# Patient Record
Sex: Female | Born: 1940 | Race: Black or African American | Hispanic: No | Smoking: Former smoker
Health system: Southern US, Community
[De-identification: ages and names within clinical notes are randomized; demographics above are authoritative.]

## PROBLEM LIST (undated history)

## (undated) DIAGNOSIS — R413 Other amnesia: Secondary | ICD-10-CM

## (undated) DIAGNOSIS — K635 Polyp of colon: Secondary | ICD-10-CM

## (undated) DIAGNOSIS — G4700 Insomnia, unspecified: Secondary | ICD-10-CM

## (undated) DIAGNOSIS — R296 Repeated falls: Secondary | ICD-10-CM

## (undated) DIAGNOSIS — I2510 Atherosclerotic heart disease of native coronary artery without angina pectoris: Secondary | ICD-10-CM

## (undated) DIAGNOSIS — M179 Osteoarthritis of knee, unspecified: Secondary | ICD-10-CM

## (undated) DIAGNOSIS — I5189 Other ill-defined heart diseases: Secondary | ICD-10-CM

## (undated) DIAGNOSIS — R600 Localized edema: Secondary | ICD-10-CM

## (undated) DIAGNOSIS — I779 Disorder of arteries and arterioles, unspecified: Secondary | ICD-10-CM

## (undated) DIAGNOSIS — I700 Atherosclerosis of aorta: Secondary | ICD-10-CM

## (undated) DIAGNOSIS — N1831 Chronic kidney disease, stage 3a: Secondary | ICD-10-CM

## (undated) DIAGNOSIS — T7840XA Allergy, unspecified, initial encounter: Secondary | ICD-10-CM

## (undated) DIAGNOSIS — I10 Essential (primary) hypertension: Secondary | ICD-10-CM

## (undated) DIAGNOSIS — M542 Cervicalgia: Secondary | ICD-10-CM

## (undated) DIAGNOSIS — F419 Anxiety disorder, unspecified: Secondary | ICD-10-CM

## (undated) DIAGNOSIS — J45909 Unspecified asthma, uncomplicated: Secondary | ICD-10-CM

## (undated) DIAGNOSIS — H409 Unspecified glaucoma: Secondary | ICD-10-CM

## (undated) DIAGNOSIS — R519 Headache, unspecified: Secondary | ICD-10-CM

## (undated) DIAGNOSIS — E118 Type 2 diabetes mellitus with unspecified complications: Secondary | ICD-10-CM

## (undated) DIAGNOSIS — M1990 Unspecified osteoarthritis, unspecified site: Secondary | ICD-10-CM

## (undated) DIAGNOSIS — S22080A Wedge compression fracture of T11-T12 vertebra, initial encounter for closed fracture: Secondary | ICD-10-CM

## (undated) DIAGNOSIS — R55 Syncope and collapse: Secondary | ICD-10-CM

## (undated) DIAGNOSIS — E119 Type 2 diabetes mellitus without complications: Secondary | ICD-10-CM

## (undated) DIAGNOSIS — E785 Hyperlipidemia, unspecified: Secondary | ICD-10-CM

## (undated) DIAGNOSIS — M8580 Other specified disorders of bone density and structure, unspecified site: Secondary | ICD-10-CM

## (undated) HISTORY — PX: TOTAL ABDOMINAL HYSTERECTOMY W/ BILATERAL SALPINGOOPHORECTOMY: SHX83

## (undated) HISTORY — DX: Essential (primary) hypertension: I10

## (undated) HISTORY — PX: ECTOPIC PREGNANCY SURGERY: SHX613

## (undated) HISTORY — DX: Allergy, unspecified, initial encounter: T78.40XA

## (undated) HISTORY — DX: Hyperlipidemia, unspecified: E78.5

## (undated) HISTORY — PX: ABDOMINAL HYSTERECTOMY: SHX81

## (undated) HISTORY — DX: Type 2 diabetes mellitus without complications: E11.9

## (undated) HISTORY — DX: Unspecified glaucoma: H40.9

## (undated) HISTORY — PX: EYE SURGERY: SHX253

## (undated) HISTORY — PX: LIPOMA RESECTION: SHX23

## (undated) HISTORY — DX: Unspecified asthma, uncomplicated: J45.909

## (undated) HISTORY — DX: Osteoarthritis of knee, unspecified: M17.9

---

## 2005-03-20 ENCOUNTER — Ambulatory Visit: Payer: Self-pay | Admitting: Orthopedic Surgery

## 2006-04-11 ENCOUNTER — Ambulatory Visit: Payer: Self-pay | Admitting: Gastroenterology

## 2006-04-28 ENCOUNTER — Emergency Department: Payer: Self-pay | Admitting: Emergency Medicine

## 2009-11-04 LAB — HM COLONOSCOPY

## 2010-06-04 ENCOUNTER — Ambulatory Visit: Payer: Self-pay | Admitting: Unknown Physician Specialty

## 2010-07-05 ENCOUNTER — Ambulatory Visit: Payer: Self-pay | Admitting: Unknown Physician Specialty

## 2011-12-13 ENCOUNTER — Emergency Department: Payer: Self-pay | Admitting: Internal Medicine

## 2011-12-13 LAB — COMPREHENSIVE METABOLIC PANEL
Alkaline Phosphatase: 66 U/L (ref 50–136)
Anion Gap: 11 (ref 7–16)
Bilirubin,Total: 1.2 mg/dL — ABNORMAL HIGH (ref 0.2–1.0)
Calcium, Total: 9.1 mg/dL (ref 8.5–10.1)
Co2: 27 mmol/L (ref 21–32)
EGFR (African American): >60
EGFR (Non-African Amer.): >60
SGOT(AST): 24 U/L (ref 15–37)
SGPT (ALT): 22 U/L

## 2011-12-13 LAB — CBC
HCT: 44 % (ref 35.0–47.0)
MCHC: 33.1 g/dL (ref 32.0–36.0)
MCV: 89 fL (ref 80–100)
Platelet: 191 10*3/uL (ref 150–440)
RBC: 4.94 10*6/uL (ref 3.80–5.20)
WBC: 7.5 10*3/uL (ref 3.6–11.0)

## 2012-03-11 ENCOUNTER — Ambulatory Visit: Payer: Self-pay | Admitting: Family Medicine

## 2012-04-02 ENCOUNTER — Ambulatory Visit: Payer: Self-pay | Admitting: Gastroenterology

## 2012-07-22 ENCOUNTER — Emergency Department: Payer: Self-pay | Admitting: Emergency Medicine

## 2012-07-22 LAB — BASIC METABOLIC PANEL
BUN: 14 mg/dL (ref 7–18)
Calcium, Total: 9.3 mg/dL (ref 8.5–10.1)
Co2: 29 mmol/L (ref 21–32)
Osmolality: 287 (ref 275–301)
Potassium: 3.6 mmol/L (ref 3.5–5.1)
Sodium: 143 mmol/L (ref 136–145)

## 2012-07-22 LAB — URINALYSIS, COMPLETE
Bacteria: NONE SEEN
Blood: NEGATIVE
Glucose,UR: NEGATIVE mg/dL (ref 0–75)
Ketone: NEGATIVE
Ph: 7 (ref 4.5–8.0)
RBC,UR: 1 /HPF (ref 0–5)
Specific Gravity: 1.014 (ref 1.003–1.030)
Squamous Epithelial: 1

## 2012-07-22 LAB — TROPONIN I: Troponin-I: <0.02 ng/mL

## 2012-07-22 LAB — CBC
HCT: 43.7 % (ref 35.0–47.0)
HGB: 14.7 g/dL (ref 12.0–16.0)
MCH: 29.7 pg (ref 26.0–34.0)
MCV: 88 fL (ref 80–100)
Platelet: 193 10*3/uL (ref 150–440)
RBC: 4.95 10*6/uL (ref 3.80–5.20)
RDW: 14 % (ref 11.5–14.5)
WBC: 7.9 10*3/uL (ref 3.6–11.0)

## 2012-07-22 LAB — CK TOTAL AND CKMB (NOT AT ARMC): CK-MB: 0.9 ng/mL (ref 0.5–3.6)

## 2012-07-23 LAB — CK TOTAL AND CKMB (NOT AT ARMC)
CK, Total: 152 U/L (ref 21–215)
CK-MB: 0.6 ng/mL (ref 0.5–3.6)

## 2013-10-01 ENCOUNTER — Ambulatory Visit: Payer: Self-pay | Admitting: Physician Assistant

## 2014-03-07 ENCOUNTER — Ambulatory Visit: Payer: Self-pay | Admitting: Physician Assistant

## 2014-04-22 ENCOUNTER — Ambulatory Visit: Payer: Self-pay | Admitting: Unknown Physician Specialty

## 2014-05-03 DIAGNOSIS — M1711 Unilateral primary osteoarthritis, right knee: Secondary | ICD-10-CM | POA: Insufficient documentation

## 2014-05-03 DIAGNOSIS — M1712 Unilateral primary osteoarthritis, left knee: Secondary | ICD-10-CM | POA: Insufficient documentation

## 2014-05-10 LAB — HM MAMMOGRAPHY: HM Mammogram: NORMAL

## 2014-10-17 ENCOUNTER — Ambulatory Visit: Payer: Self-pay | Admitting: Family Medicine

## 2014-12-12 DIAGNOSIS — I10 Essential (primary) hypertension: Secondary | ICD-10-CM | POA: Diagnosis not present

## 2014-12-12 DIAGNOSIS — E1129 Type 2 diabetes mellitus with other diabetic kidney complication: Secondary | ICD-10-CM | POA: Diagnosis not present

## 2014-12-12 DIAGNOSIS — E1165 Type 2 diabetes mellitus with hyperglycemia: Secondary | ICD-10-CM | POA: Diagnosis not present

## 2014-12-12 DIAGNOSIS — N289 Disorder of kidney and ureter, unspecified: Secondary | ICD-10-CM | POA: Diagnosis not present

## 2014-12-27 DIAGNOSIS — H4011X2 Primary open-angle glaucoma, moderate stage: Secondary | ICD-10-CM | POA: Diagnosis not present

## 2015-02-09 DIAGNOSIS — N289 Disorder of kidney and ureter, unspecified: Secondary | ICD-10-CM | POA: Diagnosis not present

## 2015-02-09 DIAGNOSIS — I10 Essential (primary) hypertension: Secondary | ICD-10-CM | POA: Diagnosis not present

## 2015-02-09 DIAGNOSIS — J302 Other seasonal allergic rhinitis: Secondary | ICD-10-CM | POA: Diagnosis not present

## 2015-02-09 DIAGNOSIS — E1165 Type 2 diabetes mellitus with hyperglycemia: Secondary | ICD-10-CM | POA: Diagnosis not present

## 2015-02-09 DIAGNOSIS — E1129 Type 2 diabetes mellitus with other diabetic kidney complication: Secondary | ICD-10-CM | POA: Diagnosis not present

## 2015-03-03 DIAGNOSIS — H4011X2 Primary open-angle glaucoma, moderate stage: Secondary | ICD-10-CM | POA: Diagnosis not present

## 2015-04-14 ENCOUNTER — Ambulatory Visit
Admission: EM | Admit: 2015-04-14 | Discharge: 2015-04-14 | Disposition: A | Discharge status: Home / Self Care | Payer: Medicare Other | Attending: Family Medicine | Admitting: Family Medicine

## 2015-04-14 DIAGNOSIS — L03113 Cellulitis of right upper limb: Secondary | ICD-10-CM | POA: Diagnosis not present

## 2015-04-14 MED ORDER — FLUCONAZOLE 150 MG PO TABS: 150.0000 mg | ORAL_TABLET | Freq: Every day | ORAL | Status: DC

## 2015-04-14 MED ORDER — CEPHALEXIN 500 MG PO CAPS: 500.0000 mg | ORAL_CAPSULE | Freq: Three times a day (TID) | ORAL | Status: DC

## 2015-04-14 NOTE — Discharge Instructions (Signed)

## 2015-04-14 NOTE — ED Provider Notes (Signed)
CSN: [id number]     Arrival date & time 04/14/15  1304 History   First MD Initiated Contact with Patient 04/14/15 1409     Chief Complaint  Patient presents with  . Insect Bite   (Consider location/radiation/quality/duration/timing/severity/associated sxs/prior Treatment) HPI Comments: 74 yo female with a 3 days h/o right arm redness, warmth, itching, pain. States thinks she may have been bitten by some insect. Denies any fevers or chills.  The history is provided by the patient.    History reviewed. No pertinent past medical history. History reviewed. No pertinent past surgical history. No family history on file. History  Substance Use Topics  . Smoking status: Never Smoker   . Smokeless tobacco: Not on file  . Alcohol Use: No   OB History    No data available     Review of Systems  Allergies  Sulfa antibiotics  Home Medications   Prior to Admission medications   Medication Sig Start Date End Date Taking? Authorizing Provider  cephALEXin (KEFLEX) 500 MG capsule Take 1 capsule (500 mg total) by mouth 3 (three) times daily. 04/14/15   Norval Gable, MD  fluconazole (DIFLUCAN) 150 MG tablet Take 1 tablet (150 mg total) by mouth daily. 04/14/15   Norval Gable, MD   BP 128/56 mmHg  Pulse 71  Temp(Src) 98.5 F (36.9 C) (Tympanic)  Ht 5\' 9"  (1.753 m)  Wt 162 lb (73.483 kg)  BMI 23.91 kg/m2  SpO2 100%  LMP  Physical Exam  Constitutional: She appears well-developed and well-nourished. No distress.  Pulmonary/Chest: Effort normal. No respiratory distress.  Skin: Rash noted. She is not diaphoretic.  Area on right upper arm skin approx 6x4cm with warmth, blanchable erythema and mild tenderness to palpation  Nursing note reviewed.   ED Course  Procedures (including critical care time) Labs Review Labs Reviewed - No data to display  Imaging Review No results found.   MDM   1. Cellulitis of arm, right    Discharge Medication List as of 04/14/2015  2:18 PM     START taking these medications   Details  cephALEXin (KEFLEX) 500 MG capsule Take 1 capsule (500 mg total) by mouth 3 (three) times daily., Starting 04/14/2015, Until Discontinued, Normal    fluconazole (DIFLUCAN) 150 MG tablet Take 1 tablet (150 mg total) by mouth daily., Starting 04/14/2015, Until Discontinued, Normal      Plan: 1. Diagnosis reviewed with patient 2. rx as per orders; risks, benefits, potential side effects reviewed with patient 3. Recommend supportive treatment with warm compresses to affected area 4. F/u prn if symptoms worsen or don't improve    Norval Gable, MD 04/14/15 1701

## 2015-04-14 NOTE — ED Notes (Signed)
Pt states "I was driving Wednesday evening and my right arm started itching. I guess I was bitten by an insect of some sort, I didn't see it." Pt's posterior right upper arm with redness and swelling.

## 2015-04-26 ENCOUNTER — Encounter: Payer: Self-pay | Admitting: Internal Medicine

## 2015-04-26 DIAGNOSIS — E119 Type 2 diabetes mellitus without complications: Secondary | ICD-10-CM | POA: Insufficient documentation

## 2015-04-26 DIAGNOSIS — G4700 Insomnia, unspecified: Secondary | ICD-10-CM | POA: Insufficient documentation

## 2015-04-26 DIAGNOSIS — S83209A Unspecified tear of unspecified meniscus, current injury, unspecified knee, initial encounter: Secondary | ICD-10-CM | POA: Insufficient documentation

## 2015-04-26 DIAGNOSIS — I10 Essential (primary) hypertension: Secondary | ICD-10-CM | POA: Insufficient documentation

## 2015-04-26 DIAGNOSIS — H409 Unspecified glaucoma: Secondary | ICD-10-CM | POA: Insufficient documentation

## 2015-04-26 DIAGNOSIS — J302 Other seasonal allergic rhinitis: Secondary | ICD-10-CM | POA: Insufficient documentation

## 2015-04-26 DIAGNOSIS — E785 Hyperlipidemia, unspecified: Secondary | ICD-10-CM | POA: Insufficient documentation

## 2015-04-26 DIAGNOSIS — E1169 Type 2 diabetes mellitus with other specified complication: Secondary | ICD-10-CM | POA: Insufficient documentation

## 2015-04-26 DIAGNOSIS — N289 Disorder of kidney and ureter, unspecified: Secondary | ICD-10-CM | POA: Insufficient documentation

## 2015-05-09 ENCOUNTER — Other Ambulatory Visit: Payer: Self-pay | Admitting: Internal Medicine

## 2015-06-14 ENCOUNTER — Encounter: Payer: Self-pay | Admitting: Internal Medicine

## 2015-06-15 ENCOUNTER — Ambulatory Visit (INDEPENDENT_AMBULATORY_CARE_PROVIDER_SITE_OTHER): Payer: Medicare Other | Admitting: Family Medicine

## 2015-06-15 VITALS — BP 140/66 | HR 72 | Ht 69.0 in | Wt 176.8 lb

## 2015-06-15 DIAGNOSIS — E119 Type 2 diabetes mellitus without complications: Secondary | ICD-10-CM

## 2015-06-15 DIAGNOSIS — H409 Unspecified glaucoma: Secondary | ICD-10-CM | POA: Diagnosis not present

## 2015-06-15 DIAGNOSIS — E1165 Type 2 diabetes mellitus with hyperglycemia: Secondary | ICD-10-CM | POA: Diagnosis not present

## 2015-06-15 DIAGNOSIS — I10 Essential (primary) hypertension: Secondary | ICD-10-CM | POA: Diagnosis not present

## 2015-06-15 DIAGNOSIS — M722 Plantar fascial fibromatosis: Secondary | ICD-10-CM | POA: Diagnosis not present

## 2015-06-15 DIAGNOSIS — E663 Overweight: Secondary | ICD-10-CM

## 2015-06-15 DIAGNOSIS — E1129 Type 2 diabetes mellitus with other diabetic kidney complication: Secondary | ICD-10-CM

## 2015-06-15 DIAGNOSIS — IMO0002 Reserved for concepts with insufficient information to code with codable children: Secondary | ICD-10-CM

## 2015-06-15 DIAGNOSIS — E784 Other hyperlipidemia: Secondary | ICD-10-CM | POA: Diagnosis not present

## 2015-06-15 DIAGNOSIS — G4700 Insomnia, unspecified: Secondary | ICD-10-CM | POA: Diagnosis not present

## 2015-06-15 DIAGNOSIS — E7849 Other hyperlipidemia: Secondary | ICD-10-CM

## 2015-06-16 ENCOUNTER — Encounter: Payer: Self-pay | Admitting: Family Medicine

## 2015-06-16 DIAGNOSIS — M722 Plantar fascial fibromatosis: Secondary | ICD-10-CM | POA: Insufficient documentation

## 2015-06-16 LAB — HEMOGLOBIN A1C
Est. average glucose Bld gHb Est-mCnc: 174 mg/dL
Hgb A1c MFr Bld: 7.7 % — ABNORMAL HIGH (ref 4.8–5.6)

## 2015-06-16 LAB — COMPREHENSIVE METABOLIC PANEL
ALT: 11 IU/L (ref 0–32)
AST: 13 IU/L (ref 0–40)
Albumin/Globulin Ratio: 2.1 (ref 1.1–2.5)
Albumin: 4.5 g/dL (ref 3.5–4.8)
Alkaline Phosphatase: 77 IU/L (ref 39–117)
BUN/Creatinine Ratio: 15 (ref 11–26)
BUN: 16 mg/dL (ref 8–27)
Bilirubin Total: 1.3 mg/dL — ABNORMAL HIGH (ref 0.0–1.2)
CALCIUM: 9.2 mg/dL (ref 8.7–10.3)
CO2: 25 mmol/L (ref 18–29)
CREATININE: 1.07 mg/dL — AB (ref 0.57–1.00)
Chloride: 103 mmol/L (ref 97–108)
GFR, EST AFRICAN AMERICAN: 60 mL/min/{1.73_m2} (ref >59)
GFR, EST NON AFRICAN AMERICAN: 52 mL/min/{1.73_m2} — AB (ref >59)
GLOBULIN, TOTAL: 2.1 g/dL (ref 1.5–4.5)
GLUCOSE: 113 mg/dL — AB (ref 65–99)
Potassium: 4 mmol/L (ref 3.5–5.2)
Sodium: 145 mmol/L — ABNORMAL HIGH (ref 134–144)
TOTAL PROTEIN: 6.6 g/dL (ref 6.0–8.5)

## 2015-06-16 LAB — LIPID PANEL
CHOLESTEROL TOTAL: 188 mg/dL (ref 100–199)
Chol/HDL Ratio: 3.2 ratio units (ref 0.0–4.4)
HDL: 59 mg/dL (ref >39)
LDL Calculated: 112 mg/dL — ABNORMAL HIGH (ref 0–99)
Triglycerides: 85 mg/dL (ref 0–149)
VLDL CHOLESTEROL CAL: 17 mg/dL (ref 5–40)

## 2015-06-16 LAB — CBC
Hematocrit: 41 % (ref 34.0–46.6)
Hemoglobin: 13 g/dL (ref 11.1–15.9)
MCH: 28.4 pg (ref 26.6–33.0)
MCHC: 31.7 g/dL (ref 31.5–35.7)
MCV: 90 fL (ref 79–97)
PLATELETS: 206 10*3/uL (ref 150–379)
RBC: 4.58 x10E6/uL (ref 3.77–5.28)
RDW: 13.7 % (ref 12.3–15.4)
WBC: 5.6 10*3/uL (ref 3.4–10.8)

## 2015-06-16 LAB — MICROALBUMIN / CREATININE URINE RATIO
Creatinine, Urine: 311.6 mg/dL
MICROALB/CREAT RATIO: 6.5 mg/g creat (ref 0.0–30.0)
MICROALBUM., U, RANDOM: 20.4 ug/mL

## 2015-06-16 LAB — TSH: TSH: 1.4 u[IU]/mL (ref 0.450–4.500)

## 2015-06-16 LAB — VITAMIN D 25 HYDROXY (VIT D DEFICIENCY, FRACTURES): VIT D 25 HYDROXY: 12.8 ng/mL — AB (ref 30.0–100.0)

## 2015-06-16 NOTE — Progress Notes (Signed)
Date:  06/15/2015   Name:  Cathy Murphy   DOB:  06/17/1941   MRN:  [id number]  PCP:  Halina Maidens, MD    Chief Complaint: Hyperlipidemia; Hypertension; and Diabetes   History of Present Illness:  This is a 74 y.o. female with DM, HTN, and HLD. Seen [facility] 04/2015 for cellulitis s/p insect bite resolved with abxs. HTN generally well controlled last 132/78 at home. A1c 7.1% 6 months ago, saw optho 02/2015, metformin causes diarrhea, takes trazodone prn insomnia. C/o R heel pain with prolonged standing (sees podiatrist), has gotten injection in R knee in past. Last tetanus 2008, declines pneumonia/flu imms. Mammo in 2016, has had two colonoscopies.  Review of Systems:  Review of Systems  Constitutional: Negative for fever.  HENT: Negative for ear pain, sore throat and trouble swallowing.   Eyes: Negative for pain.  Respiratory: Negative for cough and shortness of breath.   Cardiovascular: Negative for chest pain and leg swelling.  Gastrointestinal: Negative for abdominal pain.  Endocrine: Negative for polyuria.  Genitourinary: Negative for flank pain, difficulty urinating and pelvic pain.  Musculoskeletal: Negative for back pain.  Skin: Negative for rash.  Neurological: Negative for tremors, syncope and headaches.  Hematological: Negative for adenopathy.  Psychiatric/Behavioral: Negative for confusion and dysphoric mood.    Patient Active Problem List   Diagnosis Date Noted  . Plantar fasciitis, right 06/16/2015  . Knee torn cartilage 04/26/2015  . Allergic rhinitis, seasonal 04/26/2015  . Diabetes type 2, controlled 04/26/2015  . Essential (primary) hypertension 04/26/2015  . Glaucoma 04/26/2015  . 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 Insomnia 04/26/2015  . Impaired renal function 04/26/2015    Prior to Admission medications   Medication Sig Start Date End Date Taking? Authorizing Provider  amLODipine (NORVASC) 5 MG tablet Take 1 tablet by mouth  daily. 02/09/15  Yes Historical Provider, MD  atorvastatin (LIPITOR) 80 MG tablet Take 1 tablet by mouth daily. 12/20/14  Yes Historical Provider, MD  B-D UF III MINI PEN NEEDLES 31G X 5 MM MISC USE AS DIRECTED WITH LEVEMIR 04/08/15  Yes Historical Provider, MD  glipiZIDE (GLUCOTROL) 5 MG tablet Take 1 tablet by mouth 2 (two) times daily. 03/23/15  Yes Historical Provider, MD  glucose blood (BAYER CONTOUR TEST) test strip  06/07/10  Yes Historical Provider, MD  JANUVIA 100 MG tablet Take 1 tablet by mouth daily. 04/08/15  Yes Historical Provider, MD  LEVEMIR FLEXTOUCH 100 UNIT/ML Pen Inject 20 Units into the skin daily. 02/09/15  Yes Historical Provider, MD  lisinopril (PRINIVIL,ZESTRIL) 40 MG tablet Take 1 tablet (40 mg total) by mouth daily. 05/09/15  Yes Glean Hess, MD  pioglitazone (ACTOS) 15 MG tablet Take 1 tablet by mouth daily. 04/08/15  Yes Historical Provider, MD  traZODone (DESYREL) 50 MG tablet Take 1 tablet by mouth at bedtime. 03/23/15  Yes Historical Provider, MD  cephALEXin (KEFLEX) 500 MG capsule Take 1 capsule (500 mg total) by mouth 3 (three) times daily. Patient not taking: Reported on 06/15/2015 04/14/15   Norval Gable, MD  fluconazole (DIFLUCAN) 150 MG tablet Take 1 tablet (150 mg total) by mouth daily. Patient not taking: Reported on 06/15/2015 04/14/15   Norval Gable, MD  LUMIGAN 0.01 % SOLN TAKE 1 DROP(S) IN BOTH EYES ONCE IN THE EVENING 03/01/15   Historical Provider, MD    Allergies  Allergen Reactions  . Amoxicillin Diarrhea  . Aspirin Nausea And Vomiting  . Metformin And Related Diarrhea  . Sulfa Antibiotics  Past Surgical History  Procedure Laterality Date  . Abdominal hysterectomy      Social History  Substance Use Topics  . Smoking status: Never Smoker   . Smokeless tobacco: Not on file  . Alcohol Use: No    Family History  Problem Relation Age of Onset  . Cancer Mother     Breast  . Heart disease Father     Medication list has been reviewed and  updated.  Physical Examination: BP 140/66 mmHg  Pulse 72  Ht 5\' 9"  (1.753 m)  Wt 176 lb 12.8 oz (80.196 kg)  BMI 26.10 kg/m2  Physical Exam  Constitutional: She is oriented to person, place, and time. She appears well-developed and well-nourished. No distress.  HENT:  Head: Normocephalic and atraumatic.  Right Ear: External ear normal.  Left Ear: External ear normal.  Nose: Nose normal.  Mouth/Throat: Oropharynx is clear and moist.  Eyes: Conjunctivae and EOM are normal. Pupils are equal, round, and reactive to light.  Neck: Neck supple. No thyromegaly present.  Cardiovascular: Normal rate, regular rhythm and normal heart sounds.   Pulmonary/Chest: Effort normal and breath sounds normal.  Abdominal: Soft. She exhibits no distension and no mass. There is no tenderness.  Musculoskeletal: Normal range of motion. She exhibits no edema.  Lymphadenopathy:    She has no cervical adenopathy.  Neurological: She is alert and oriented to person, place, and time.  Skin: Skin is warm and dry. No rash noted.  Psychiatric: She has a normal mood and affect. Her behavior is normal.    Assessment and Plan:  1. Essential (primary) hypertension Adequate control, continue current regimen - Comprehensive Metabolic Panel (CMET) - CBC  2. Type II diabetes mellitus controlled Continue current regimen - Lipid Profile - HgB A1c - Urine Microalbumin w/creat. ratio  3. Glaucoma Followed by optho  4. Familial multiple lipoprotein-type hyperlipidemia Check lipids on Lipitor  5. Overweight (BMI 25.0-29.9) Discussed exercise/weight loss - TSH - Vitamin D (25 hydroxy)  7. R plantar fasciitis Discuss with podiatrist next visit  8. Insomnia Continue trazodone prn only  Return in about 3 months (around 09/15/2015).  Satira Anis. [facility]  06/16/2015

## 2015-07-20 ENCOUNTER — Other Ambulatory Visit: Payer: Self-pay | Admitting: Podiatry

## 2015-07-20 ENCOUNTER — Ambulatory Visit
Admission: RE | Admit: 2015-07-20 | Discharge: 2015-07-20 | Disposition: A | Discharge status: Home / Self Care | Payer: Medicare Other | Source: Ambulatory Visit | Attending: Podiatry | Admitting: Podiatry

## 2015-07-20 DIAGNOSIS — R600 Localized edema: Secondary | ICD-10-CM

## 2015-07-20 DIAGNOSIS — M7989 Other specified soft tissue disorders: Secondary | ICD-10-CM | POA: Diagnosis not present

## 2015-07-20 DIAGNOSIS — M722 Plantar fascial fibromatosis: Secondary | ICD-10-CM | POA: Diagnosis not present

## 2015-07-24 ENCOUNTER — Other Ambulatory Visit: Payer: Self-pay | Admitting: Internal Medicine

## 2015-09-01 DIAGNOSIS — H401132 Primary open-angle glaucoma, bilateral, moderate stage: Secondary | ICD-10-CM | POA: Diagnosis not present

## 2015-10-16 ENCOUNTER — Other Ambulatory Visit: Payer: Self-pay | Admitting: Internal Medicine

## 2015-10-18 NOTE — Telephone Encounter (Signed)
pts coming in on 11/09/15

## 2015-11-09 ENCOUNTER — Encounter: Payer: Self-pay | Admitting: Internal Medicine

## 2015-11-09 ENCOUNTER — Ambulatory Visit (INDEPENDENT_AMBULATORY_CARE_PROVIDER_SITE_OTHER): Payer: Medicare Other | Admitting: Internal Medicine

## 2015-11-09 VITALS — BP 132/74 | HR 68 | Ht 69.0 in | Wt 178.2 lb

## 2015-11-09 DIAGNOSIS — Z794 Long term (current) use of insulin: Secondary | ICD-10-CM

## 2015-11-09 DIAGNOSIS — N289 Disorder of kidney and ureter, unspecified: Secondary | ICD-10-CM

## 2015-11-09 DIAGNOSIS — E119 Type 2 diabetes mellitus without complications: Secondary | ICD-10-CM | POA: Insufficient documentation

## 2015-11-09 DIAGNOSIS — E118 Type 2 diabetes mellitus with unspecified complications: Secondary | ICD-10-CM | POA: Insufficient documentation

## 2015-11-09 DIAGNOSIS — I10 Essential (primary) hypertension: Secondary | ICD-10-CM

## 2015-11-09 DIAGNOSIS — J309 Allergic rhinitis, unspecified: Secondary | ICD-10-CM | POA: Diagnosis not present

## 2015-11-09 MED ORDER — LISINOPRIL 40 MG PO TABS: 40.0000 mg | ORAL_TABLET | Freq: Every day | ORAL | Status: DC

## 2015-11-09 NOTE — Progress Notes (Signed)
Date:  11/09/2015   Name:  Cathy Murphy   DOB:  [birth year]   MRN:  [id number]   Chief Complaint: Follow-up; Diabetes; and Hypertension Diabetes She presents for her follow-up diabetic visit. She has type 2 diabetes mellitus. Her disease course has been stable. There are no hypoglycemic associated symptoms. Pertinent negatives for hypoglycemia include no dizziness or headaches. Pertinent negatives for diabetes include no chest pain and no fatigue. She is compliant with treatment all of the time. She is following a diabetic diet. Her breakfast blood glucose is taken between 6-7 am. Her breakfast blood glucose range is generally 70-90 mg/dl. An ACE inhibitor/angiotensin II receptor blocker is being taken. Eye exam is current.  Hypertension This is a chronic problem. The current episode started more than 1 year ago. The problem is unchanged. The problem is controlled. Pertinent negatives include no chest pain, headaches, palpitations or shortness of breath. Past treatments include calcium channel blockers and ACE inhibitors. The current treatment provides significant improvement. There are no compliance problems.   Rhinorrhea - patient complains of sneezing and runny nose at intervals. She cannot recall any inciting location or exposure. She denies sinus pain or discolored drainage. She's had no fever or chills, ear pain or sore throat. She has not taken any medication for this. She's tried Flonase in the past but does not like the smell.  Review of Systems  Constitutional: Negative for fever, chills and fatigue.  HENT: Positive for rhinorrhea and sneezing. Negative for congestion, tinnitus and trouble swallowing.   Respiratory: Negative for choking, chest tightness and shortness of breath.   Cardiovascular: Negative for chest pain, palpitations and leg swelling.  Gastrointestinal: Negative for abdominal pain.  Genitourinary: Negative for dysuria.  Musculoskeletal: Positive for arthralgias (right  heel faciitis). Negative for back pain.  Skin: Negative for color change and rash.  Neurological: Negative for dizziness, light-headedness and headaches.  Psychiatric/Behavioral: Negative for decreased concentration.    Patient Active Problem List   Diagnosis Date Noted  . Plantar fasciitis, right 06/16/2015  . Knee torn cartilage 04/26/2015  . Allergic rhinitis, seasonal 04/26/2015  . Diabetes type 2, controlled ([place]) 04/26/2015  . Essential (primary) hypertension 04/26/2015  . Glaucoma 04/26/2015  . 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 Insomnia 04/26/2015  . Impaired renal function 04/26/2015    Prior to Admission medications   Medication Sig Start Date End Date Taking? Authorizing Provider  amLODipine (NORVASC) 5 MG tablet Take 1 tablet by mouth daily. 02/09/15  Yes Historical Provider, MD  atorvastatin (LIPITOR) 80 MG tablet Take 1 tablet by mouth daily. 12/20/14  Yes Historical Provider, MD  B-D UF III MINI PEN NEEDLES 31G X 5 MM MISC USE AS DIRECTED WITH LEVEMIR 07/24/15  Yes Glean Hess, MD  glipiZIDE (GLUCOTROL) 5 MG tablet Take 1 tablet by mouth 2 (two) times daily. 03/23/15  Yes Historical Provider, MD  glucose blood (BAYER CONTOUR TEST) test strip  06/07/10  Yes Historical Provider, MD  JANUVIA 100 MG tablet Take 1 tablet by mouth daily. 04/08/15  Yes Historical Provider, MD  LEVEMIR FLEXTOUCH 100 UNIT/ML Pen Inject 20 Units into the skin daily. 02/09/15  Yes Historical Provider, MD  lisinopril (PRINIVIL,ZESTRIL) 40 MG tablet TAKE 1 TABLET (40 MG TOTAL) BY MOUTH DAILY. 10/16/15  Yes Glean Hess, MD  LUMIGAN 0.01 % SOLN TAKE 1 DROP(S) IN BOTH EYES ONCE IN THE EVENING 03/01/15  Yes Historical Provider, MD  pioglitazone (ACTOS) 15 MG tablet Take 1 tablet by  mouth daily. 04/08/15  Yes Historical Provider, MD  traZODone (DESYREL) 50 MG tablet Take 1 tablet by mouth at bedtime as needed. 03/23/15  Yes Historical Provider, MD    Allergies  Allergen  Reactions  . Amoxicillin Diarrhea  . Aspirin Nausea And Vomiting  . Metformin And Related Diarrhea  . Sulfa Antibiotics     Past Surgical History  Procedure Laterality Date  . Abdominal hysterectomy      Social History  Substance Use Topics  . Smoking status: Never Smoker   . Smokeless tobacco: None  . Alcohol Use: No   Lab Results  Component Value Date   HGBA1C 7.7* 06/15/2015   Lab Results  Component Value Date   CREATININE 1.07* 06/15/2015    Medication list has been reviewed and updated.   Physical Exam  Constitutional: She is oriented to person, place, and time. She appears well-developed. No distress.  HENT:  Head: Normocephalic and atraumatic.  Right Ear: Tympanic membrane and ear canal normal.  Left Ear: Tympanic membrane and ear canal normal.  Nose: Mucosal edema present.  Mouth/Throat: Oropharynx is clear and moist.  Eyes: Conjunctivae are normal. Right eye exhibits no discharge. Left eye exhibits no discharge. No scleral icterus.  Neck: Normal range of motion. Neck supple.  Cardiovascular: Normal rate, regular rhythm and normal heart sounds.   Pulmonary/Chest: Effort normal and breath sounds normal. No respiratory distress.  Musculoskeletal: Normal range of motion.  Neurological: She is alert and oriented to person, place, and time.  Skin: Skin is warm and dry. No rash noted.  Psychiatric: She has a normal mood and affect. Her speech is normal and behavior is normal. Thought content normal.  Nursing note and vitals reviewed.   BP 132/74 mmHg  Pulse 68  Ht 5\' 9"  (1.753 m)  Wt 178 lb 3.2 oz (80.831 kg)  BMI 26.30 kg/m2  Assessment and Plan: 1. Essential (primary) hypertension Controlled on current medication - lisinopril (PRINIVIL,ZESTRIL) 40 MG tablet; Take 1 tablet (40 mg total) by mouth daily.  Dispense: 30 tablet; Refill: 12 - CBC with Differential/Platelet  2. Controlled type 2 diabetes mellitus without complication, with long-term current  use of insulin ([place]) Doing well on current regimen Continue annual eye exams Diabetic foot exam is normal today - Hemoglobin A1c  3. Impaired renal function Will continue to monitor - Comprehensive metabolic panel  4. Allergic rhinitis, unspecified allergic rhinitis type Zyrtec 10 mg daily as needed  Halina Maidens, MD [facility]  11/09/2015

## 2015-11-10 ENCOUNTER — Telehealth: Payer: Self-pay

## 2015-11-10 LAB — CBC WITH DIFFERENTIAL/PLATELET
BASOS: 1 %
Basophils Absolute: 0 10*3/uL (ref 0.0–0.2)
EOS (ABSOLUTE): 0.1 10*3/uL (ref 0.0–0.4)
EOS: 1 %
HEMATOCRIT: 41.3 % (ref 34.0–46.6)
Hemoglobin: 13.6 g/dL (ref 11.1–15.9)
Immature Grans (Abs): 0 10*3/uL (ref 0.0–0.1)
Immature Granulocytes: 0 %
LYMPHS ABS: 1.4 10*3/uL (ref 0.7–3.1)
Lymphs: 24 %
MCH: 29.4 pg (ref 26.6–33.0)
MCHC: 32.9 g/dL (ref 31.5–35.7)
MCV: 89 fL (ref 79–97)
MONOS ABS: 0.4 10*3/uL (ref 0.1–0.9)
Monocytes: 6 %
NEUTROS PCT: 68 %
Neutrophils Absolute: 3.9 10*3/uL (ref 1.4–7.0)
Platelets: 230 10*3/uL (ref 150–379)
RBC: 4.63 x10E6/uL (ref 3.77–5.28)
RDW: 13.7 % (ref 12.3–15.4)
WBC: 5.8 10*3/uL (ref 3.4–10.8)

## 2015-11-10 LAB — HEMOGLOBIN A1C
ESTIMATED AVERAGE GLUCOSE: 177 mg/dL
HEMOGLOBIN A1C: 7.8 % — AB (ref 4.8–5.6)

## 2015-11-10 LAB — COMPREHENSIVE METABOLIC PANEL
A/G RATIO: 1.8 (ref 1.1–2.5)
ALK PHOS: 87 IU/L (ref 39–117)
ALT: 16 IU/L (ref 0–32)
AST: 15 IU/L (ref 0–40)
Albumin: 4.4 g/dL (ref 3.5–4.8)
BUN/Creatinine Ratio: 14 (ref 11–26)
BUN: 14 mg/dL (ref 8–27)
Bilirubin Total: 1.1 mg/dL (ref 0.0–1.2)
CO2: 26 mmol/L (ref 18–29)
Calcium: 9.1 mg/dL (ref 8.7–10.3)
Chloride: 100 mmol/L (ref 96–106)
Creatinine, Ser: 1 mg/dL (ref 0.57–1.00)
GFR calc Af Amer: 65 mL/min/{1.73_m2} (ref >59)
GFR, EST NON AFRICAN AMERICAN: 56 mL/min/{1.73_m2} — AB (ref >59)
GLOBULIN, TOTAL: 2.4 g/dL (ref 1.5–4.5)
Glucose: 99 mg/dL (ref 65–99)
POTASSIUM: 4 mmol/L (ref 3.5–5.2)
Sodium: 145 mmol/L — ABNORMAL HIGH (ref 134–144)
Total Protein: 6.8 g/dL (ref 6.0–8.5)

## 2015-11-10 NOTE — Telephone Encounter (Signed)
-----   Message from Glean Hess, MD sent at 11/10/2015  8:02 AM EST ----- DM is good.  Other labs are normal.

## 2015-11-10 NOTE — Telephone Encounter (Signed)
LMTCB.. MAH 

## 2015-11-10 NOTE — Telephone Encounter (Signed)
Spoke with pt. Pt. Advised.  

## 2015-11-22 DIAGNOSIS — Z1231 Encounter for screening mammogram for malignant neoplasm of breast: Secondary | ICD-10-CM | POA: Diagnosis not present

## 2015-12-21 ENCOUNTER — Other Ambulatory Visit: Payer: Self-pay | Admitting: Internal Medicine

## 2016-01-02 DIAGNOSIS — H401132 Primary open-angle glaucoma, bilateral, moderate stage: Secondary | ICD-10-CM | POA: Diagnosis not present

## 2016-01-30 DIAGNOSIS — M722 Plantar fascial fibromatosis: Secondary | ICD-10-CM | POA: Diagnosis not present

## 2016-01-30 DIAGNOSIS — M79671 Pain in right foot: Secondary | ICD-10-CM | POA: Diagnosis not present

## 2016-02-14 ENCOUNTER — Other Ambulatory Visit: Payer: Self-pay | Admitting: Internal Medicine

## 2016-02-22 ENCOUNTER — Other Ambulatory Visit: Payer: Self-pay | Admitting: Internal Medicine

## 2016-02-27 ENCOUNTER — Other Ambulatory Visit: Payer: Self-pay | Admitting: Internal Medicine

## 2016-03-05 ENCOUNTER — Other Ambulatory Visit: Payer: Self-pay | Admitting: Internal Medicine

## 2016-03-06 ENCOUNTER — Ambulatory Visit (INDEPENDENT_AMBULATORY_CARE_PROVIDER_SITE_OTHER): Payer: Medicare Other | Admitting: Internal Medicine

## 2016-03-06 ENCOUNTER — Encounter: Payer: Self-pay | Admitting: Internal Medicine

## 2016-03-06 VITALS — BP 120/76 | HR 60 | Ht 68.0 in | Wt 177.0 lb

## 2016-03-06 DIAGNOSIS — I10 Essential (primary) hypertension: Secondary | ICD-10-CM

## 2016-03-06 DIAGNOSIS — K439 Ventral hernia without obstruction or gangrene: Secondary | ICD-10-CM

## 2016-03-06 DIAGNOSIS — Z794 Long term (current) use of insulin: Secondary | ICD-10-CM

## 2016-03-06 DIAGNOSIS — R42 Dizziness and giddiness: Secondary | ICD-10-CM | POA: Diagnosis not present

## 2016-03-06 DIAGNOSIS — E784 Other hyperlipidemia: Secondary | ICD-10-CM

## 2016-03-06 DIAGNOSIS — Z0000 Encounter for general adult medical examination without abnormal findings: Secondary | ICD-10-CM | POA: Diagnosis not present

## 2016-03-06 DIAGNOSIS — R300 Dysuria: Secondary | ICD-10-CM

## 2016-03-06 DIAGNOSIS — E119 Type 2 diabetes mellitus without complications: Secondary | ICD-10-CM | POA: Diagnosis not present

## 2016-03-06 DIAGNOSIS — E7849 Other hyperlipidemia: Secondary | ICD-10-CM

## 2016-03-06 LAB — POC URINALYSIS WITH MICROSCOPIC (NON AUTO)MANUAL RESULT
BILIRUBIN UA: NEGATIVE
Crystals: 0
Epithelial cells, urine per micros: 2
Glucose, UA: NEGATIVE
KETONES UA: NEGATIVE
MUCUS UA: 0
Nitrite, UA: NEGATIVE
PH UA: 6
RBC: 2 M/uL — AB (ref 4.04–5.48)
Spec Grav, UA: 1.025
UROBILINOGEN UA: 0.2
WBC CASTS UA: 10

## 2016-03-06 MED ORDER — CIPROFLOXACIN HCL 250 MG PO TABS: 250.0000 mg | ORAL_TABLET | Freq: Two times a day (BID) | ORAL | Status: DC

## 2016-03-06 NOTE — Patient Instructions (Addendum)
Health Maintenance  Topic Date Due  . DEXA SCAN  10/28/2008  . PNA vac Low Risk Adult (1 of 2 - PCV13) 11/04/2016 (Originally 10/28/2008)  . ZOSTAVAX  11/04/2018 (Originally 10/29/2003)  . HEMOGLOBIN A1C  05/08/2016  . INFLUENZA VACCINE  06/04/2016  . FOOT EXAM  11/08/2016  . OPHTHALMOLOGY EXAM  01/02/2017  . MAMMOGRAM  11/21/2017  . TETANUS/TDAP  03/04/2021  . COLONOSCOPY  04/02/2022     For vertigo, you can take Dramamine as needed.  However, it will make you sleepy so only take it if you have symptoms that are lasting for minutes at a time.  Vertigo Vertigo means you feel like you or your surroundings are moving when they are not. Vertigo can be dangerous if it occurs when you are at work, driving, or performing difficult activities.  CAUSES  Vertigo occurs when there is a conflict of signals sent to your brain from the visual and sensory systems in your body. There are many different causes of vertigo, including:  Infections, especially in the inner ear.  A bad reaction to a drug or misuse of alcohol and medicines.  Withdrawal from drugs or alcohol.  Rapidly changing positions, such as lying down or rolling over in bed.  A migraine headache.  Decreased blood flow to the brain.  Increased pressure in the brain from a head injury, infection, tumor, or bleeding. SYMPTOMS  You may feel as though the world is spinning around or you are falling to the ground. Because your balance is upset, vertigo can cause nausea and vomiting. You may have involuntary eye movements (nystagmus). DIAGNOSIS  Vertigo is usually diagnosed by physical exam. If the cause of your vertigo is unknown, your caregiver may perform imaging tests, such as an MRI scan (magnetic resonance imaging). TREATMENT  Most cases of vertigo resolve on their own, without treatment. Depending on the cause, your caregiver may prescribe certain medicines. If your vertigo is related to body position issues, your caregiver may  recommend movements or procedures to correct the problem. In rare cases, if your vertigo is caused by certain inner ear problems, you may need surgery. HOME CARE INSTRUCTIONS   Follow your caregiver's instructions.  Avoid driving.  Avoid operating heavy machinery.  Avoid performing any tasks that would be dangerous to you or others during a vertigo episode.  Tell your caregiver if you notice that certain medicines seem to be causing your vertigo. Some of the medicines used to treat vertigo episodes can actually make them worse in some people. SEEK IMMEDIATE MEDICAL CARE IF:   Your medicines do not relieve your vertigo or are making it worse.  You develop problems with talking, walking, weakness, or using your arms, hands, or legs.  You develop severe headaches.  Your nausea or vomiting continues or gets worse.  You develop visual changes.  A family member notices behavioral changes.  Your condition gets worse. MAKE SURE YOU:  Understand these instructions.  Will watch your condition.  Will get help right away if you are not doing well or get worse.   This information is not intended to replace advice given to you by your health care provider. Make sure you discuss any questions you have with your health care provider.   Document Released: 07/31/2005 Document Revised: 01/13/2012 Document Reviewed: 02/13/2015 Elsevier Interactive Patient Education [employer].

## 2016-03-06 NOTE — Progress Notes (Signed)
Patient: Cathy Murphy, Female    DOB: 02/12/1941, 75 y.o.   MRN: [id number] Visit Date: 03/06/2016  Today's Provider: Halina Maidens, MD   Chief Complaint  Patient presents with  . Annual Exam    had mammo for this year, doesn't get a pap   Subjective:    Annual wellness visit Cathy Murphy is a 75 y.o. female who presents today for her Subsequent Annual Wellness Visit. She feels well. She reports exercising walking at work. She reports she is sleeping fairly well.   ----------------------------------------------------------- Diabetes She presents for her follow-up diabetic visit. She has type 2 diabetes mellitus. Her disease course has been stable. Hypoglycemia symptoms include dizziness and sweats. Pertinent negatives for hypoglycemia include no headaches, nervousness/anxiousness or tremors. Pertinent negatives for diabetes include no chest pain, no fatigue, no polydipsia and no polyuria. Her weight is stable. She monitors blood glucose at home 1-2 x per week. Blood glucose monitoring compliance is adequate. Her breakfast blood glucose is taken between 7-8 am. Her breakfast blood glucose range is generally 90-110 mg/dl.  Hypertension This is a chronic problem. The current episode started today. The problem is unchanged. The problem is controlled. Associated symptoms include sweats. Pertinent negatives include no chest pain, headaches, palpitations or shortness of breath. Risk factors for coronary artery disease include dyslipidemia. Past treatments include ACE inhibitors.  Abdominal swelling - she has noticed some fullness above her navel.  It is slightly tender and becomes more prominent after eating. Urine problem - She has noticed that her urine seems strong and there is slight discomfort with urination. She denies fever or chills flank pain nausea vomiting or hematuria. Vertigo - over the past week she's had mild dizziness and vertigo-like symptoms when she turns her head quickly or  lies in bed. It only lasts a few seconds and resolves with sitting up or changing position. She denies vomiting ear pain hearing loss or tinnitus.  Review of Systems  Constitutional: Negative for fever, chills and fatigue.  HENT: Negative for congestion, hearing loss, tinnitus, trouble swallowing and voice change.   Eyes: Negative for visual disturbance.  Respiratory: Negative for cough, chest tightness, shortness of breath and wheezing.   Cardiovascular: Negative for chest pain, palpitations and leg swelling.  Gastrointestinal: Positive for abdominal distention. Negative for vomiting, abdominal pain, diarrhea and constipation.  Endocrine: Negative for polydipsia and polyuria.  Genitourinary: Positive for dysuria. Negative for frequency, vaginal bleeding, vaginal discharge and genital sores.  Musculoskeletal: Negative for joint swelling, arthralgias and gait problem.  Skin: Negative for color change and rash.  Neurological: Positive for dizziness. Negative for tremors, light-headedness and headaches.  Hematological: Negative for adenopathy. Does not bruise/bleed easily.  Psychiatric/Behavioral: Negative for sleep disturbance and dysphoric mood. The patient is not nervous/anxious.     Social History   Social History  . Marital Status: Widowed    Spouse Name: N/A  . Number of Children: N/A  . Years of Education: N/A   Occupational History  . Not on file.   Social History Main Topics  . Smoking status: Never Smoker   . Smokeless tobacco: Not on file  . Alcohol Use: No  . Drug Use: No  . Sexual Activity: No   Other Topics Concern  . Not on file   Social History Narrative    Patient Active Problem List   Diagnosis Date Noted  . Controlled type 2 diabetes mellitus without complication, with long-term current use of insulin ([place]) 11/09/2015  . Rhinitis, allergic  11/09/2015  . Plantar fasciitis, right 06/16/2015  . Knee torn cartilage 04/26/2015  . Allergic rhinitis, seasonal  04/26/2015  . Essential (primary) hypertension 04/26/2015  . Glaucoma 04/26/2015  . 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 Insomnia 04/26/2015  . Impaired renal function 04/26/2015    Past Surgical History  Procedure Laterality Date  . Abdominal hysterectomy      Her family history includes Cancer in her mother; Heart disease in her father.    Previous Medications   AMLODIPINE (NORVASC) 5 MG TABLET    TAKE 1 TABLET BY MOUTH EVERY DAY   ATORVASTATIN (LIPITOR) 80 MG TABLET    TAKE 1 TABLET BY MOUTH EVERY DAY   B-D UF III MINI PEN NEEDLES 31G X 5 MM MISC    USE AS DIRECTED WITH LEVEMIR   CHOLECALCIFEROL (VITAMIN D) 1000 UNITS TABLET    Take 2,000 Units by mouth daily.   GLIPIZIDE (GLUCOTROL) 5 MG TABLET    TAKE 1 TABLET BY MOUTH TWICE A DAY   GLUCOSE BLOOD (BAYER CONTOUR TEST) TEST STRIP       JANUVIA 100 MG TABLET    TAKE 1 TABLET BY MOUTH EVERY DAY   LEVEMIR FLEXTOUCH 100 UNIT/ML PEN    USE 20 UNITS SUBCUTANEOUSLY EVERY DAY   LISINOPRIL (PRINIVIL,ZESTRIL) 40 MG TABLET    Take 1 tablet (40 mg total) by mouth daily.   LUMIGAN 0.01 % SOLN    TAKE 1 DROP(S) IN BOTH EYES ONCE IN THE EVENING   PIOGLITAZONE (ACTOS) 15 MG TABLET    Take 1 tablet by mouth daily.   TRAZODONE (DESYREL) 50 MG TABLET    TAKE 1 TABLET BY MOUTH AT BEDTIME    Patient Care Team: Glean Hess, MD as PCP - General (Family Medicine)     Objective:   Vitals: BP 120/76 mmHg  Pulse 60  Ht 5\' 8"  (1.727 m)  Wt 177 lb (80.287 kg)  BMI 26.92 kg/m2  Physical Exam  Constitutional: She is oriented to person, place, and time. She appears well-developed and well-nourished. No distress.  HENT:  Head: Normocephalic and atraumatic.  Right Ear: Tympanic membrane and ear canal normal.  Left Ear: Tympanic membrane and ear canal normal.  Nose: Right sinus exhibits no maxillary sinus tenderness. Left sinus exhibits no maxillary sinus tenderness.  Mouth/Throat: Uvula is midline and oropharynx is  clear and moist.  Eyes: Conjunctivae and EOM are normal. Pupils are equal, round, and reactive to light. Right eye exhibits no discharge. Left eye exhibits no discharge. No scleral icterus. Right eye exhibits no nystagmus.  Neck: Normal range of motion. Carotid bruit is not present. No erythema present. No thyromegaly present.  Cardiovascular: Normal rate, regular rhythm, normal heart sounds and normal pulses.   Pulmonary/Chest: Effort normal. No respiratory distress. She has no wheezes.  Abdominal: Soft. Bowel sounds are normal. There is no hepatosplenomegaly. There is no tenderness. There is no CVA tenderness. A hernia is present. Hernia confirmed positive in the ventral area.    Musculoskeletal: Normal range of motion.  Lymphadenopathy:    She has no cervical adenopathy.    She has no axillary adenopathy.  Neurological: She is alert and oriented to person, place, and time. She has normal reflexes. No cranial nerve deficit or sensory deficit.  Normal foot exam - normal nails, pulses, skin and sensation bilaterally  Skin: Skin is warm, dry and intact. No rash noted.  Psychiatric: She has a normal mood and affect. Her speech is normal and  behavior is normal. Thought content normal.  Nursing note and vitals reviewed.   Activities of Daily Living In your present state of health, do you have any difficulty performing the following activities: 03/06/2016 06/15/2015  Hearing? N N  Vision? N Y  Difficulty concentrating or making decisions? N N  Walking or climbing stairs? N N  Dressing or bathing? N N  Doing errands, shopping? N N    Fall Risk Assessment Fall Risk  03/06/2016 06/15/2015  Falls in the past year? No No      Depression Screen PHQ 2/9 Scores 03/06/2016 06/15/2015  PHQ - 2 Score 1 0    Cognitive Testing - 6-CIT   Correct? Score   What year is it? yes 0 Yes = 0    No = 4  What month is it? yes 0 Yes = 0    No = 3  Remember:     [address]ia [address], [place], [place]     What  time is it? yes 0 Yes = 0    No = 3  Count backwards from 20 to 1 yes 0 Correct = 0    1 error = 2   More than 1 error = 4  Say the months of the year in reverse. yes 0 Correct = 0    1 error = 2   More than 1 error = 4  What address did I ask you to remember? yes 0 Correct = 0  1 error = 2    2 error = 4    3 error = 6    4 error = 8    All wrong = 10       TOTAL SCORE  0/28   Interpretation:  Normal  Normal (0-7) Abnormal (8-28)      Medicare Annual Wellness Visit Summary:  Reviewed patient's Family Medical History Reviewed and updated list of patient's medical providers Assessment of cognitive impairment was done Assessed patient's functional ability Established a written schedule for health screening [place] Completed and Reviewed  Exercise Activities and Dietary recommendations Goals    None       There is no immunization history on file for this patient.  Health Maintenance  Topic Date Due  . DEXA SCAN  10/28/2008  . PNA vac Low Risk Adult (1 of 2 - PCV13) 11/04/2016 (Originally 10/28/2008)  . ZOSTAVAX  11/04/2018 (Originally 10/29/2003)  . HEMOGLOBIN A1C  05/08/2016  . INFLUENZA VACCINE  06/04/2016  . FOOT EXAM  11/08/2016  . OPHTHALMOLOGY EXAM  01/02/2017  . MAMMOGRAM  11/21/2017  . TETANUS/TDAP  03/04/2021  . COLONOSCOPY  04/02/2022     Discussed health benefits of physical activity, and encouraged her to engage in regular exercise appropriate for her age and condition.    ------------------------------------------------------------------------------------------------------------   Assessment & Plan:  1. Medicare annual wellness visit, subsequent MAW measures satisfied She declines all pneumonia vaccines  2. Ventral hernia without obstruction or gangrene Precautions given - avoid heavy lifting until cleared - Ambulatory referral to General Surgery  3. Controlled type 2 diabetes mellitus without complication, with long-term  current use of insulin ([facility]) Continue current therapy - Hemoglobin A1c - Microalbumin / creatinine urine ratio  4. Essential (primary) hypertension controlled  5. Familial multiple lipoprotein-type hyperlipidemia On statin therapy - Lipid panel  6. Dysuria - POC urinalysis w microscopic (non auto) - ciprofloxacin (CIPRO) 250 MG tablet; Take 1 tablet (250 mg total) by mouth 2 (two)  times daily.  Dispense: 14 tablet; Refill: 0  7. Vertigo Likely benign, viral and limited She is reassured and will follow up if needed  Halina Maidens, MD [facility]  03/06/2016

## 2016-03-07 ENCOUNTER — Other Ambulatory Visit: Payer: Self-pay | Admitting: Internal Medicine

## 2016-03-07 LAB — HEMOGLOBIN A1C
ESTIMATED AVERAGE GLUCOSE: 177 mg/dL
HEMOGLOBIN A1C: 7.8 % — AB (ref 4.8–5.6)

## 2016-03-07 LAB — LIPID PANEL
CHOLESTEROL TOTAL: 199 mg/dL (ref 100–199)
Chol/HDL Ratio: 3.3 ratio units (ref 0.0–4.4)
HDL: 61 mg/dL (ref >39)
LDL Calculated: 118 mg/dL — ABNORMAL HIGH (ref 0–99)
Triglycerides: 98 mg/dL (ref 0–149)
VLDL CHOLESTEROL CAL: 20 mg/dL (ref 5–40)

## 2016-03-11 ENCOUNTER — Other Ambulatory Visit: Payer: Self-pay

## 2016-03-11 DIAGNOSIS — E785 Hyperlipidemia, unspecified: Secondary | ICD-10-CM | POA: Insufficient documentation

## 2016-03-11 DIAGNOSIS — E119 Type 2 diabetes mellitus without complications: Secondary | ICD-10-CM | POA: Insufficient documentation

## 2016-03-13 ENCOUNTER — Ambulatory Visit (INDEPENDENT_AMBULATORY_CARE_PROVIDER_SITE_OTHER): Payer: Medicare Other | Admitting: General Surgery

## 2016-03-13 ENCOUNTER — Encounter: Payer: Self-pay | Admitting: General Surgery

## 2016-03-13 VITALS — BP 136/66 | HR 76 | Temp 99.4°F | Ht 69.0 in | Wt 178.0 lb

## 2016-03-13 DIAGNOSIS — K439 Ventral hernia without obstruction or gangrene: Secondary | ICD-10-CM

## 2016-03-13 NOTE — Progress Notes (Signed)
Patient ID: Cathy Murphy, female   DOB: [birth year], 75 y.o.   MRN: [id number]  CC: Bulge in abdomen  HPI Cathy Murphy is a 75 y.o. female presents to clinic for evaluation of a bulge to her abdomen above her umbilicus. Patient states is been there for some time and she initially thought it was related to insulin injections in that part of her abdomen. She moved the area of injections to the lower part of the abdomen and did not nose any improvement in the bulge. She states the bulge sometimes worsens after eating were a little protrude further and become hard. Prior to last week she's never been told she had a hernia. The area never goes away even when she lays flat. She denies any fevers, chills, nausea, vomiting, chest pain, shortness of breath, diarrhea, constipation.  HPI  Past Medical History  Diagnosis Date  . Hypertension   . Hyperlipidemia   . Diabetes mellitus without complication ([place])   . Allergy   . Glaucoma     Past Surgical History  Procedure Laterality Date  . Abdominal hysterectomy      Family History  Problem Relation Age of Onset  . Cancer Mother     Breast  . Heart disease Father     Social History Social History  Substance Use Topics  . Smoking status: Never Smoker   . Smokeless tobacco: None  . Alcohol Use: No    Allergies  Allergen Reactions  . Amoxicillin Diarrhea  . Aspirin Nausea And Vomiting  . Metformin And Related Diarrhea  . Sulfa Antibiotics     Current Outpatient Prescriptions  Medication Sig Dispense Refill  . amLODipine (NORVASC) 5 MG tablet TAKE 1 TABLET BY MOUTH EVERY DAY 30 tablet 10  . atorvastatin (LIPITOR) 80 MG tablet TAKE 1 TABLET BY MOUTH EVERY DAY 30 tablet 11  . B-D UF III MINI PEN NEEDLES 31G X 5 MM MISC USE AS DIRECTED WITH LEVEMIR 100 each 2  . cholecalciferol (VITAMIN D) 1000 units tablet Take 2,000 Units by mouth daily.    . ciprofloxacin (CIPRO) 250 MG tablet Take 1 tablet (250 mg total) by mouth 2 (two) times  daily. 14 tablet 0  . glipiZIDE (GLUCOTROL) 5 MG tablet TAKE 1 TABLET BY MOUTH TWICE A DAY 60 tablet 11  . glucose blood (BAYER CONTOUR TEST) test strip     . JANUVIA 100 MG tablet TAKE 1 TABLET BY MOUTH EVERY DAY 30 tablet 11  . LEVEMIR FLEXTOUCH 100 UNIT/ML Pen USE 20 UNITS SUBCUTANEOUSLY EVERY DAY 15 pen 3  . lisinopril (PRINIVIL,ZESTRIL) 40 MG tablet Take 1 tablet (40 mg total) by mouth daily. 30 tablet 12  . LUMIGAN 0.01 % SOLN TAKE 1 DROP(S) IN BOTH EYES ONCE IN THE EVENING  5  . pioglitazone (ACTOS) 15 MG tablet TAKE 1 TABLET BY MOUTH EVERY DAY 30 tablet 11  . timolol (TIMOPTIC) 0.5 % ophthalmic solution Place 1 drop into both eyes.  5  . traZODone (DESYREL) 50 MG tablet TAKE 1 TABLET BY MOUTH AT BEDTIME 30 tablet 11   No current facility-administered medications for this visit.     Review of Systems A Multi-point review of systems was asked and was negative except for the findings documented in the history of present illness  Physical Exam Blood pressure 136/66, pulse 76, temperature 99.4 F (37.4 C), temperature source Oral, height 5\' 9"  (1.753 m), weight 80.74 kg (178 lb). CONSTITUTIONAL: No acute distress. EYES: Pupils are equal, round,  and reactive to light, Sclera are non-icteric. EARS, NOSE, MOUTH AND THROAT: The oropharynx is clear. The oral mucosa is pink and moist. Hearing is intact to voice. LYMPH NODES:  Lymph nodes in the neck are normal. RESPIRATORY:  Lungs are clear. There is normal respiratory effort, with equal breath sounds bilaterally, and without pathologic use of accessory muscles. CARDIOVASCULAR: Heart is regular without murmurs, gallops, or rubs. GI: The abdomen is soft, nontender, and nondistended. There is a palpable atypical area of protrusion above the umbilicus. It is approximately 10 cm wide by 4 cm tall. It does not reduce or feel like a standard hernia. However given the history of it getting larger upon eating does increase the likelihood of this  being hernia.. There is no hepatosplenomegaly. There are normal bowel sounds in all quadrants. GU: Rectal deferred.   MUSCULOSKELETAL: Normal muscle strength and tone. No cyanosis or edema.   SKIN: Turgor is good and there are no pathologic skin lesions or ulcers. NEUROLOGIC: Motor and sensation is grossly normal. Cranial nerves are grossly intact. PSYCH:  Oriented to person, place and time. Affect is normal.  Data Reviewed No images reviewed labs to review     Assessment    75 year old female with likely atypical hernia    Plan    75 year old female with a likely atypical hernia secondary to her prior surgeries. The area of concern is oblong in nature and does not feel like a reducible hernia. However given the history of increasing in size and symptomatology after eating it does raise the concern for a ventral hernia. I cannot clearly visualize or palpate a fascial defect at this time. Given this we will obtain an outpatient CT scan and have patient follow-up in 3 weeks to discuss the results and determine whether or not we will plan to pursue any further surgical intervention or not. All questions were answered to the patient's satisfaction and she'll follow-up in clinic in 3 weeks.     Time spent with the patient was 30 minutes, with more than 50% of the time spent in face-to-face education, counseling and care coordination.     Clayburn Pert, MD FACS General Surgeon 03/13/2016, 3:13 PM

## 2016-03-13 NOTE — Patient Instructions (Addendum)
We have a Ct scan scheduled for May 17 @ 11:15 am.You will need to pick up your prep kit several days prior to your scan. Nothing to eat or drink 4 hours prior to your scan. We have made you a follow up appointment which is listed below to review your CT scan results with Dr. Adonis Huguenin.  If you have any questions or concerns please call our office.

## 2016-03-20 ENCOUNTER — Ambulatory Visit
Admission: RE | Admit: 2016-03-20 | Discharge: 2016-03-20 | Disposition: A | Discharge status: Home / Self Care | Payer: Medicare Other | Source: Ambulatory Visit | Attending: General Surgery | Admitting: General Surgery

## 2016-03-20 DIAGNOSIS — I2510 Atherosclerotic heart disease of native coronary artery without angina pectoris: Secondary | ICD-10-CM | POA: Insufficient documentation

## 2016-03-20 DIAGNOSIS — R1900 Intra-abdominal and pelvic swelling, mass and lump, unspecified site: Secondary | ICD-10-CM | POA: Insufficient documentation

## 2016-03-20 DIAGNOSIS — K439 Ventral hernia without obstruction or gangrene: Secondary | ICD-10-CM

## 2016-03-20 LAB — POCT I-STAT CREATININE: CREATININE: 0.9 mg/dL (ref 0.44–1.00)

## 2016-03-20 MED ORDER — IOPAMIDOL (ISOVUE-300) INJECTION 61%
100.0000 mL | Freq: Once | INTRAVENOUS | Status: AC | PRN
  Administered 2016-03-20: 100 mL via INTRAVENOUS

## 2016-03-25 ENCOUNTER — Telehealth: Payer: Self-pay

## 2016-03-25 NOTE — Telephone Encounter (Signed)
-----   Message from Clayburn Pert, MD sent at 03/20/2016  9:08 PM EDT ----- Patient just needs a follow up appt.  Thanks

## 2016-03-25 NOTE — Telephone Encounter (Signed)
Patient has follow up appointment scheduled on 04-03-16.

## 2016-03-27 ENCOUNTER — Ambulatory Visit (INDEPENDENT_AMBULATORY_CARE_PROVIDER_SITE_OTHER): Payer: Medicare Other

## 2016-03-27 ENCOUNTER — Ambulatory Visit
Admission: EM | Admit: 2016-03-27 | Discharge: 2016-03-27 | Disposition: A | Discharge status: Home / Self Care | Payer: Medicare Other | Attending: Family Medicine | Admitting: Family Medicine

## 2016-03-27 DIAGNOSIS — Z8249 Family history of ischemic heart disease and other diseases of the circulatory system: Secondary | ICD-10-CM | POA: Diagnosis not present

## 2016-03-27 DIAGNOSIS — Z888 Allergy status to other drugs, medicaments and biological substances status: Secondary | ICD-10-CM | POA: Insufficient documentation

## 2016-03-27 DIAGNOSIS — Z886 Allergy status to analgesic agent status: Secondary | ICD-10-CM | POA: Insufficient documentation

## 2016-03-27 DIAGNOSIS — Z79899 Other long term (current) drug therapy: Secondary | ICD-10-CM | POA: Diagnosis not present

## 2016-03-27 DIAGNOSIS — Z882 Allergy status to sulfonamides status: Secondary | ICD-10-CM | POA: Diagnosis not present

## 2016-03-27 DIAGNOSIS — Z881 Allergy status to other antibiotic agents status: Secondary | ICD-10-CM | POA: Insufficient documentation

## 2016-03-27 DIAGNOSIS — M546 Pain in thoracic spine: Secondary | ICD-10-CM | POA: Insufficient documentation

## 2016-03-27 DIAGNOSIS — R079 Chest pain, unspecified: Secondary | ICD-10-CM

## 2016-03-27 DIAGNOSIS — E785 Hyperlipidemia, unspecified: Secondary | ICD-10-CM | POA: Insufficient documentation

## 2016-03-27 DIAGNOSIS — E119 Type 2 diabetes mellitus without complications: Secondary | ICD-10-CM | POA: Diagnosis not present

## 2016-03-27 DIAGNOSIS — Z794 Long term (current) use of insulin: Secondary | ICD-10-CM | POA: Diagnosis not present

## 2016-03-27 DIAGNOSIS — I10 Essential (primary) hypertension: Secondary | ICD-10-CM | POA: Diagnosis not present

## 2016-03-27 DIAGNOSIS — M549 Dorsalgia, unspecified: Secondary | ICD-10-CM | POA: Diagnosis present

## 2016-03-27 DIAGNOSIS — R0789 Other chest pain: Secondary | ICD-10-CM | POA: Diagnosis not present

## 2016-03-27 LAB — COMPREHENSIVE METABOLIC PANEL
ALK PHOS: 76 U/L (ref 38–126)
ALT: 17 U/L (ref 14–54)
AST: 19 U/L (ref 15–41)
Albumin: 4 g/dL (ref 3.5–5.0)
Anion gap: 8 (ref 5–15)
BUN: 18 mg/dL (ref 6–20)
CALCIUM: 9.3 mg/dL (ref 8.9–10.3)
CHLORIDE: 105 mmol/L (ref 101–111)
CO2: 26 mmol/L (ref 22–32)
CREATININE: 0.88 mg/dL (ref 0.44–1.00)
GFR calc Af Amer: >60 mL/min (ref >60)
GFR calc non Af Amer: >60 mL/min (ref >60)
Glucose, Bld: 124 mg/dL — ABNORMAL HIGH (ref 65–99)
Potassium: 3.9 mmol/L (ref 3.5–5.1)
SODIUM: 139 mmol/L (ref 135–145)
Total Bilirubin: 1.4 mg/dL — ABNORMAL HIGH (ref 0.3–1.2)
Total Protein: 7.2 g/dL (ref 6.5–8.1)

## 2016-03-27 LAB — CBC WITH DIFFERENTIAL/PLATELET
BASOS ABS: 0.1 10*3/uL (ref 0–0.1)
Basophils Relative: 1 %
EOS ABS: 0.1 10*3/uL (ref 0–0.7)
Eosinophils Relative: 2 %
HCT: 41 % (ref 35.0–47.0)
HEMOGLOBIN: 13.7 g/dL (ref 12.0–16.0)
LYMPHS ABS: 1.5 10*3/uL (ref 1.0–3.6)
LYMPHS PCT: 22 %
MCH: 29.5 pg (ref 26.0–34.0)
MCHC: 33.3 g/dL (ref 32.0–36.0)
MCV: 88.6 fL (ref 80.0–100.0)
Monocytes Absolute: 0.4 10*3/uL (ref 0.2–0.9)
Monocytes Relative: 6 %
NEUTROS PCT: 69 %
Neutro Abs: 4.5 10*3/uL (ref 1.4–6.5)
Platelets: 187 10*3/uL (ref 150–440)
RBC: 4.63 MIL/uL (ref 3.80–5.20)
RDW: 13.7 % (ref 11.5–14.5)
WBC: 6.5 10*3/uL (ref 3.6–11.0)

## 2016-03-27 LAB — CKMB (ARMC ONLY): CK, MB: 1.2 ng/mL (ref 0.5–5.0)

## 2016-03-27 LAB — CK: CK TOTAL: 113 U/L (ref 38–234)

## 2016-03-27 LAB — TROPONIN I: Troponin I: <0.03 ng/mL (ref <0.031)

## 2016-03-27 MED ORDER — TRAMADOL HCL 50 MG PO TABS: 50.0000 mg | ORAL_TABLET | Freq: Four times a day (QID) | ORAL | Status: DC | PRN

## 2016-03-27 NOTE — Discharge Instructions (Signed)
Nonspecific Chest Pain It is often hard to find the cause of chest pain. There is always a chance that your pain could be related to something serious, such as a heart attack or a blood clot in your lungs. Chest pain can also be caused by conditions that are not life-threatening. If you have chest pain, it is very important to follow up with your doctor.  HOME CARE  If you were prescribed an antibiotic medicine, finish it all even if you start to feel better.  Avoid any activities that cause chest pain.  Do not use any tobacco products, including cigarettes, chewing tobacco, or electronic cigarettes. If you need help quitting, ask your doctor.  Do not drink alcohol.  Take medicines only as told by your doctor.  Keep all follow-up visits as told by your doctor. This is important. This includes any further testing if your chest pain does not go away.  Your doctor may tell you to keep your head raised (elevated) while you sleep.  Make lifestyle changes as told by your doctor. These may include:  Getting regular exercise. Ask your doctor to suggest some activities that are safe for you.  Eating a heart-healthy diet. Your doctor or a diet specialist (dietitian) can help you to learn healthy eating options.  Maintaining a healthy weight.  Managing diabetes, if necessary.  Reducing stress. GET HELP IF:  Your chest pain does not go away, even after treatment.  You have a rash with blisters on your chest.  You have a fever. GET HELP RIGHT AWAY IF:  Your chest pain is worse.  You have an increasing cough, or you cough up blood.  You have severe belly (abdominal) pain.  You feel extremely weak.  You pass out (faint).  You have chills.  You have sudden, unexplained chest discomfort.  You have sudden, unexplained discomfort in your arms, back, neck, or jaw.  You have shortness of breath at any time.  You suddenly start to sweat, or your skin gets clammy.  You feel  nauseous.  You vomit.  You suddenly feel light-headed or dizzy.  Your heart begins to beat quickly, or it feels like it is skipping beats. These symptoms may be an emergency. Do not wait to see if the symptoms will go away. Get medical help right away. Call your local emergency services (911 in the [place] Do not drive yourself to the hospital.   This information is not intended to replace advice given to you by your health care provider. Make sure you discuss any questions you have with your health care provider.   Document Released: 04/08/2008 Document Revised: 11/11/2014 Document Reviewed: 05/27/2014 Elsevier Interactive Patient Education 2016 [employer]  Radicular Pain Radicular pain in either the arm or leg is usually from a bulging or herniated disk in the spine. A piece of the herniated disk may press against the nerves as the nerves exit the spine. This causes pain which is felt at the tips of the nerves down the arm or leg. Other causes of radicular pain may include:  Fractures.  Heart disease.  Cancer.  An abnormal and usually degenerative state of the nervous system or nerves (neuropathy). Diagnosis may require CT or MRI scanning to determine the primary cause.  Nerves that start at the neck (nerve roots) may cause radicular pain in the outer shoulder and arm. It can spread down to the thumb and fingers. The symptoms vary depending on which nerve root has been affected. In most  cases radicular pain improves with conservative treatment. Neck problems may require physical therapy, a neck collar, or cervical traction. Treatment may take many weeks, and surgery may be considered if the symptoms do not improve.  Conservative treatment is also recommended for sciatica. Sciatica causes pain to radiate from the lower back or buttock area down the leg into the foot. Often there is a history of back problems. Most patients with sciatica are better after 2 to 4 weeks of rest and other  supportive care. Short term bed rest can reduce the disk pressure considerably. Sitting, however, is not a good position since this increases the pressure on the disk. You should avoid bending, lifting, and all other activities which make the problem worse. Traction can be used in severe cases. Surgery is usually reserved for patients who do not improve within the first months of treatment. Only take over-the-counter or prescription medicines for pain, discomfort, or fever as directed by your caregiver. Narcotics and muscle relaxants may help by relieving more severe pain and spasm and by providing mild sedation. Cold or massage can give significant relief. Spinal manipulation is not recommended. It can increase the degree of disc protrusion. Epidural steroid injections are often effective treatment for radicular pain. These injections deliver medicine to the spinal nerve in the space between the protective covering of the spinal cord and back bones (vertebrae). Your caregiver can give you more information about steroid injections. These injections are most effective when given within two weeks of the onset of pain.  You should see your caregiver for follow up care as recommended. A program for neck and back injury rehabilitation with stretching and strengthening exercises is an important part of management.  SEEK IMMEDIATE MEDICAL CARE IF:  You develop increased pain, weakness, or numbness in your arm or leg.  You develop difficulty with bladder or bowel control.  You develop abdominal pain.   This information is not intended to replace advice given to you by your health care provider. Make sure you discuss any questions you have with your health care provider.   Document Released: 11/28/2004 Document Revised: 11/11/2014 Document Reviewed: 05/17/2015 Elsevier Interactive Patient Education [employer].

## 2016-03-27 NOTE — ED Provider Notes (Signed)
CSN: [id number]     Arrival date & time 03/27/16  [id number] History   First MD Initiated Contact with Patient 03/27/16 1011    Nurses notes were reviewed. Chief Complaint  Patient presents with  . Back Pain    Pain since Monday under left breast and radiating around side to mid back. Constant and feels like gas. Pain 8/10   Patient reports having back pain that moved to her left chest that started on Monday. She was doing her usual activities at work and she was stretching when she first noticed the pain. The pain has been constant does not get worse when she is active. Better when she rest and is not aggravated with any type of motion that she does. She does have hypertension diabetes she does not smoke and as she states she's had a physical last month. [place] she's never had a heart attack or heart disease. Her father did have heart disease and pancreatic cancer mother had breast cancer. She denies any shortness of breath fever rash or radiation of this pain. She's been having difficulty sleeping at night because the pain.     (Consider location/radiation/quality/duration/timing/severity/associated sxs/prior Treatment) Patient is a 75 y.o. female presenting with back pain. The history is provided by the patient. No language interpreter was used.  Back Pain Location:  Generalized Radiates to:  Does not radiate Pain severity:  Moderate Pain is:  Worse during the day Onset quality:  Sudden Duration:  2 days Timing:  Constant Progression:  Worsening Chronicity:  New Context: not emotional stress, not falling, not jumping from heights, not physical stress and not recent injury   Relieved by:  None tried Worsened by:  Nothing tried Associated symptoms: chest pain   Risk factors: no hx of cancer, no hx of osteoporosis and no lack of exercise     Past Medical History  Diagnosis Date  . Hypertension   . Hyperlipidemia   . Diabetes mellitus without complication ([place])   . Allergy   . Glaucoma     Past Surgical History  Procedure Laterality Date  . Abdominal hysterectomy     Family History  Problem Relation Age of Onset  . Cancer Mother     Breast  . Heart disease Father    Social History  Substance Use Topics  . Smoking status: Never Smoker   . Smokeless tobacco: None  . Alcohol Use: No   OB History    No data available     Review of Systems  Respiratory: Negative for cough, shortness of breath and wheezing.   Cardiovascular: Positive for chest pain.  Musculoskeletal: Positive for back pain.  Skin: Negative for rash.  All other systems reviewed and are negative.   Allergies  Amoxicillin; Aspirin; Metformin and related; and Sulfa antibiotics  Home Medications   Prior to Admission medications   Medication Sig Start Date End Date Taking? Authorizing Provider  amLODipine (NORVASC) 5 MG tablet TAKE 1 TABLET BY MOUTH EVERY DAY 03/05/16  Yes Glean Hess, MD  atorvastatin (LIPITOR) 80 MG tablet TAKE 1 TABLET BY MOUTH EVERY DAY 12/21/15  Yes Glean Hess, MD  B-D UF III MINI PEN NEEDLES 31G X 5 MM MISC USE AS DIRECTED WITH LEVEMIR 07/24/15  Yes Glean Hess, MD  cholecalciferol (VITAMIN D) 1000 units tablet Take 2,000 Units by mouth daily.   Yes Historical Provider, MD  glipiZIDE (GLUCOTROL) 5 MG tablet TAKE 1 TABLET BY MOUTH TWICE A DAY 02/14/16  Yes Jesse Sans  Army Melia, MD  glucose blood (BAYER CONTOUR TEST) test strip  06/07/10  Yes Historical Provider, MD  JANUVIA 100 MG tablet TAKE 1 TABLET BY MOUTH EVERY DAY 02/22/16  Yes Glean Hess, MD  LEVEMIR FLEXTOUCH 100 UNIT/ML Pen USE 20 UNITS SUBCUTANEOUSLY EVERY DAY 02/27/16  Yes Glean Hess, MD  lisinopril (PRINIVIL,ZESTRIL) 40 MG tablet Take 1 tablet (40 mg total) by mouth daily. 11/09/15  Yes Glean Hess, MD  LUMIGAN 0.01 % SOLN TAKE 1 DROP(S) IN BOTH EYES ONCE IN THE EVENING 03/01/15  Yes Historical Provider, MD  pioglitazone (ACTOS) 15 MG tablet TAKE 1 TABLET BY MOUTH EVERY DAY 03/07/16  Yes Glean Hess, MD  timolol (TIMOPTIC) 0.5 % ophthalmic solution Place 1 drop into both eyes. 02/15/16  Yes Historical Provider, MD  traZODone (DESYREL) 50 MG tablet TAKE 1 TABLET BY MOUTH AT BEDTIME 02/22/16  Yes Glean Hess, MD  ciprofloxacin (CIPRO) 250 MG tablet Take 1 tablet (250 mg total) by mouth 2 (two) times daily. 03/06/16   Glean Hess, MD  traMADol (ULTRAM) 50 MG tablet Take 1 tablet (50 mg total) by mouth every 6 (six) hours as needed. 03/27/16   Frederich Cha, MD   Meds Ordered and Administered this Visit  Medications - No data to display  BP 117/73 mmHg  Pulse 68  Temp(Src) 98.2 F (36.8 C) (Oral)  Resp 20  Ht 5\' 9"  (1.753 m)  Wt 177 lb (80.287 kg)  BMI 26.13 kg/m2  SpO2 100% No data found.   Physical Exam  Constitutional: She is oriented to person, place, and time. She appears well-developed and well-nourished. No distress.  HENT:  Head: Normocephalic and atraumatic.  Eyes: Conjunctivae are normal. Pupils are equal, round, and reactive to light. Right eye exhibits no discharge. Left eye exhibits no discharge.  Neck: Normal range of motion. Neck supple. No tracheal deviation present.  Cardiovascular: Normal rate, regular rhythm and normal heart sounds.   Pulmonary/Chest: Effort normal and breath sounds normal. No respiratory distress. She has no wheezes. Chest wall is not dull to percussion. She exhibits no mass and no tenderness. Right breast exhibits no tenderness.  Abdominal: Soft.  Musculoskeletal: Normal range of motion. She exhibits no tenderness.  Neurological: She is alert and oriented to person, place, and time. No cranial nerve deficit.  Skin: Skin is warm and dry. No erythema.  Psychiatric: She has a normal mood and affect.  Vitals reviewed.   ED Course  Procedures (including critical care time)  Labs Review Labs Reviewed  COMPREHENSIVE METABOLIC PANEL - Abnormal; Notable for the following:    Glucose, Bld 124 (*)    Total Bilirubin 1.4 (*)    All  other components within normal limits  CBC WITH DIFFERENTIAL/PLATELET  TROPONIN I  CK  CKMB([facility] ONLY)    Imaging Review Dg Chest 2 View  03/27/2016  CLINICAL DATA:  75 year old female with chest pain, left posterior rib and flank area pain for 3 days. Initial encounter. EXAM: CHEST  2 VIEW COMPARISON:  CT Abdomen and Pelvis 03/20/2016. FINDINGS: Normal lung volumes. Normal cardiac size and mediastinal contours. Visualized tracheal air column is within normal limits. The lungs are clear. No pneumothorax or effusion. No acute osseous abnormality identified. Degenerative changes right acromioclavicular joint. IMPRESSION: Negative, no acute cardiopulmonary abnormality. Electronically Signed   By: Genevie Ann M.D.   On: 03/27/2016 11:05     Visual Acuity Review  Right Eye Distance:   Left Eye Distance:  Bilateral Distance:    Right Eye Near:   Left Eye Near:    Bilateral Near:      Results for orders placed or performed during the hospital encounter of 03/27/16  CBC with Differential  Result Value Ref Range   WBC 6.5 3.6 - 11.0 K/uL   RBC 4.63 3.80 - 5.20 MIL/uL   Hemoglobin 13.7 12.0 - 16.0 g/dL   HCT 41.0 35.0 - 47.0 %   MCV 88.6 80.0 - 100.0 fL   MCH 29.5 26.0 - 34.0 pg   MCHC 33.3 32.0 - 36.0 g/dL   RDW 13.7 11.5 - 14.5 %   Platelets 187 150 - 440 K/uL   Neutrophils Relative % 69 %   Neutro Abs 4.5 1.4 - 6.5 K/uL   Lymphocytes Relative 22 %   Lymphs Abs 1.5 1.0 - 3.6 K/uL   Monocytes Relative 6 %   Monocytes Absolute 0.4 0.2 - 0.9 K/uL   Eosinophils Relative 2 %   Eosinophils Absolute 0.1 0 - 0.7 K/uL   Basophils Relative 1 %   Basophils Absolute 0.1 0 - 0.1 K/uL  Comprehensive metabolic panel  Result Value Ref Range   Sodium 139 135 - 145 mmol/L   Potassium 3.9 3.5 - 5.1 mmol/L   Chloride 105 101 - 111 mmol/L   CO2 26 22 - 32 mmol/L   Glucose, Bld 124 (H) 65 - 99 mg/dL   BUN 18 6 - 20 mg/dL   Creatinine, Ser 0.88 0.44 - 1.00 mg/dL   Calcium 9.3 8.9 - 10.3 mg/dL    Total Protein 7.2 6.5 - 8.1 g/dL   Albumin 4.0 3.5 - 5.0 g/dL   AST 19 15 - 41 U/L   ALT 17 14 - 54 U/L   Alkaline Phosphatase 76 38 - 126 U/L   Total Bilirubin 1.4 (H) 0.3 - 1.2 mg/dL   GFR calc non Af Amer >60 >60 mL/min   GFR calc Af Amer >60 >60 mL/min   Anion gap 8 5 - 15  Troponin I  Result Value Ref Range   Troponin I <0.03 <0.031 ng/mL  CK  Result Value Ref Range   Total CK 113 38 - 234 U/L  CKMB([facility] only)  Result Value Ref Range   CK, MB 1.2 0.5 - 5.0 ng/mL     MDM   1. Left-sided thoracic back pain   2. Chest pain at rest       If cardiac enzymes and rest of her labs are normal as expected we will offer her tramadol for pain but expect this is an early manifestation of possible shingles. Will recommend she contact her PCP or return here if rash doesn't appear to be reevaluated and possibly placed on medications such as Valtrex. If no rash appears within the next 3-5 days I would strongly suggest she follow-up with her PCP at that time and discuss with them about possible stresstest and other workup and evaluation for other causes of this discomfort.    ED ECG REPORT I, Stafford Riviera H, the attending physician, personally viewed and interpreted this ECG.   Date: 03/27/2016  EKG Time:10:06:53  Rate: 66  Rhythm: normal EKG, normal sinus rhythm, there are no previous tracings available for comparison  Axis: 61  Intervals:none  ST&T Change:none   Frederich Cha, MD 03/27/16 1142

## 2016-03-30 ENCOUNTER — Ambulatory Visit
Admission: EM | Admit: 2016-03-30 | Discharge: 2016-03-30 | Disposition: A | Discharge status: Home / Self Care | Payer: Medicare Other | Attending: Family Medicine | Admitting: Family Medicine

## 2016-03-30 ENCOUNTER — Encounter: Payer: Self-pay | Admitting: *Deleted

## 2016-03-30 DIAGNOSIS — B029 Zoster without complications: Secondary | ICD-10-CM

## 2016-03-30 MED ORDER — VALACYCLOVIR HCL 1 G PO TABS: 1000.0000 mg | ORAL_TABLET | Freq: Three times a day (TID) | ORAL | Status: AC

## 2016-03-30 NOTE — ED Notes (Signed)
Pt states that she was seen here in the clinic this past week, now has a rash on her left flank area, possible shingles

## 2016-03-30 NOTE — Discharge Instructions (Signed)
Take medication as prescribed. Rest. Drink plenty of fluids. Continue home medication as needed.   Follow up with your primary care physician this week as needed. Return to Urgent care for new or worsening concerns.    Shingles Shingles, which is also known as herpes zoster, is an infection that causes a painful skin rash and fluid-filled blisters. Shingles is not related to genital herpes, which is a sexually transmitted infection.   Shingles only develops in people who:  Have had chickenpox.  Have received the chickenpox vaccine. (This is rare.) CAUSES Shingles is caused by varicella-zoster virus (VZV). This is the same virus that causes chickenpox. After exposure to VZV, the virus stays in the body in an inactive (dormant) state. Shingles develops if the virus reactivates. This can happen many years after the initial exposure to VZV. It is not known what causes this virus to reactivate. RISK FACTORS People who have had chickenpox or received the chickenpox vaccine are at risk for shingles. Infection is more common in people who:  Are older than age 71.  Have a weakened defense (immune) system, such as those with HIV, AIDS, or cancer.  Are taking medicines that weaken the immune system, such as transplant medicines.  Are under great stress. SYMPTOMS Early symptoms of this condition include itching, tingling, and pain in an area on your skin. Pain may be described as burning, stabbing, or throbbing. A few days or weeks after symptoms start, a painful red rash appears, usually on one side of the body in a bandlike or beltlike pattern. The rash eventually turns into fluid-filled blisters that break open, scab over, and dry up in about 2-3 weeks. At any time during the infection, you may also develop:  A fever.  Chills.  A headache.  An upset stomach. DIAGNOSIS This condition is diagnosed with a skin exam. Sometimes, skin or fluid samples are taken from the blisters before a  diagnosis is made. These samples are examined under a microscope or sent to a lab for testing. TREATMENT There is no specific cure for this condition. Your health care provider will probably prescribe medicines to help you manage pain, recover more quickly, and avoid long-term problems. Medicines may include:  Antiviral drugs.  Anti-inflammatory drugs.  Pain medicines. If the area involved is on your face, you may be referred to a specialist, such as an eye doctor (ophthalmologist) or an ear, nose, and throat (ENT) doctor to help you avoid eye problems, chronic pain, or disability. HOME CARE INSTRUCTIONS Medicines  Take medicines only as directed by your health care provider.  Apply an anti-itch or numbing cream to the affected area as directed by your health care provider. Blister and Rash Care  Take a cool bath or apply cool compresses to the area of the rash or blisters as directed by your health care provider. This may help with pain and itching.  Keep your rash covered with a loose bandage (dressing). Wear loose-fitting clothing to help ease the pain of material rubbing against the rash.  Keep your rash and blisters clean with mild soap and cool water or as directed by your health care provider.  Check your rash every day for signs of infection. These include redness, swelling, and pain that lasts or increases.  Do not pick your blisters.  Do not scratch your rash. General Instructions  Rest as directed by your health care provider.  Keep all follow-up visits as directed by your health care provider. This is important.  Until  your blisters scab over, your infection can cause chickenpox in people who have never had it or been vaccinated against it. To prevent this from happening, avoid contact with other people, especially:  Babies.  Pregnant women.  Children who have eczema.  Elderly people who have transplants.  People who have chronic illnesses, such as leukemia or  AIDS. SEEK MEDICAL CARE IF:  Your pain is not relieved with prescribed medicines.  Your pain does not get better after the rash heals.  Your rash looks infected. Signs of infection include redness, swelling, and pain that lasts or increases. SEEK IMMEDIATE MEDICAL CARE IF:  The rash is on your face or nose.  You have facial pain, pain around your eye area, or loss of feeling on one side of your face.  You have ear pain or you have ringing in your ear.  You have loss of taste.  Your condition gets worse.   This information is not intended to replace advice given to you by your health care provider. Make sure you discuss any questions you have with your health care provider.   Document Released: 10/21/2005 Document Revised: 11/11/2014 Document Reviewed: 09/01/2014 Elsevier Interactive Patient Education [employer].

## 2016-03-30 NOTE — ED Provider Notes (Signed)
[facility]  ____________________________________________  Time seen: Approximately 12:29 PM  I have reviewed the triage vital signs and the nursing notes.   HISTORY  Chief Complaint Rash   HPI Cathy Murphy is a 75 y.o. female presents with complaint of left back rash has been present since last night. Patient reports that she was seen in urgent care 3 days ago for a discomfort in which she described as a burning pain to her left side. Patient reports that that seemed pain has now resolved except for work she does have a burning pain located only around the rash that is present. Denies any other pain. Denies any other rash.denies known trigger for rash. Denies changes in foods, medicines, lotions, detergents or other contacts.  Denies any chest pain, shortness of breath, abdominal pain, dysuria, neck pain, back pain, either, weakness or dizziness. Reports continues to eat and drink well. Reports did have chickenpox as a child. Reports has not received the shingles immunization. Reports when she was in urgent care a few days ago she was given a perception for tramadol which does help somewhat with the pain. States pain is overall mild and tolerable. Patient states that pain again is described as more of a burning sensation and states that it is irritating to have close or her bra touching the area.   PCP: Army Melia  Past Medical History  Diagnosis Date  . Hypertension   . Hyperlipidemia   . Diabetes mellitus without complication ([place])   . Allergy   . Glaucoma     Patient Active Problem List   Diagnosis Date Noted  . Type 2 diabetes mellitus ([place]) 03/11/2016  . Dyslipidemia 03/11/2016  . Ventral hernia without obstruction or gangrene 03/06/2016  . Controlled type 2 diabetes mellitus without complication, with long-term current use of insulin ([place]) 11/09/2015  . Rhinitis, allergic 11/09/2015  . Plantar fasciitis, right 06/16/2015  . Knee torn cartilage 04/26/2015  .  Allergic rhinitis, seasonal 04/26/2015  . Essential (primary) hypertension 04/26/2015  . Glaucoma 04/26/2015  . 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 Insomnia 04/26/2015  . Impaired renal function 04/26/2015    Past Surgical History  Procedure Laterality Date  . Abdominal hysterectomy      Current Outpatient Rx  Name  Route  Sig  Dispense  Refill  . amLODipine (NORVASC) 5 MG tablet      TAKE 1 TABLET BY MOUTH EVERY DAY   30 tablet   10   . atorvastatin (LIPITOR) 80 MG tablet      TAKE 1 TABLET BY MOUTH EVERY DAY   30 tablet   11   . B-D UF III MINI PEN NEEDLES 31G X 5 MM MISC      USE AS DIRECTED WITH LEVEMIR   100 each   2   . cholecalciferol (VITAMIN D) 1000 units tablet   Oral   Take 2,000 Units by mouth daily.         . ciprofloxacin (CIPRO) 250 MG tablet   Oral   Take 1 tablet (250 mg total) by mouth 2 (two) times daily.   14 tablet   0   . glipiZIDE (GLUCOTROL) 5 MG tablet      TAKE 1 TABLET BY MOUTH TWICE A DAY   60 tablet   11   . glucose blood (BAYER CONTOUR TEST) test strip               . JANUVIA 100 MG tablet  TAKE 1 TABLET BY MOUTH EVERY DAY   30 tablet   11   . LEVEMIR FLEXTOUCH 100 UNIT/ML Pen      USE 20 UNITS SUBCUTANEOUSLY EVERY DAY   15 pen   3   . lisinopril (PRINIVIL,ZESTRIL) 40 MG tablet   Oral   Take 1 tablet (40 mg total) by mouth daily.   30 tablet   12   . LUMIGAN 0.01 % SOLN      TAKE 1 DROP(S) IN BOTH EYES ONCE IN THE EVENING      5     Dispense as written.   . pioglitazone (ACTOS) 15 MG tablet      TAKE 1 TABLET BY MOUTH EVERY DAY   30 tablet   11   . timolol (TIMOPTIC) 0.5 % ophthalmic solution   Both Eyes   Place 1 drop into both eyes.      5   . traMADol (ULTRAM) 50 MG tablet   Oral   Take 1 tablet (50 mg total) by mouth every 6 (six) hours as needed.   15 tablet   0   . traZODone (DESYREL) 50 MG tablet      TAKE 1 TABLET BY MOUTH AT BEDTIME   30  tablet   11   . valACYclovir (VALTREX) 1000 MG tablet   Oral   Take 1 tablet (1,000 mg total) by mouth 3 (three) times daily. For 7 days   21 tablet   0     Allergies Amoxicillin; Aspirin; Metformin and related; and Sulfa antibiotics  Family History  Problem Relation Age of Onset  . Cancer Mother     Breast  . Heart disease Father     Social History Social History  Substance Use Topics  . Smoking status: Never Smoker   . Smokeless tobacco: None  . Alcohol Use: No    Review of Systems Constitutional: No fever/chills Eyes: No visual changes. ENT: No sore throat or throat discomfort. Cardiovascular: Denies chest pain. Respiratory: Denies shortness of breath. Gastrointestinal: No abdominal pain.  No nausea, no vomiting.  No diarrhea.  No constipation. Genitourinary: Negative for dysuria. Musculoskeletal: Negative for back pain. Skin: Positive for rash. Neurological: Negative for headaches, focal weakness or numbness.  10-point ROS otherwise negative.  ____________________________________________   PHYSICAL EXAM:  VITAL SIGNS: ED Triage Vitals  Enc Vitals Group     BP 03/30/16 1043 120/61 mmHg     Pulse Rate 03/30/16 1043 78     Resp --      Temp 03/30/16 1043 98.3 F (36.8 C)     Temp Source 03/30/16 1043 Oral     SpO2 03/30/16 1043 100 %     Weight 03/30/16 1043 177 lb (80.287 kg)     Height 03/30/16 1043 5\' 9"  (1.753 m)     Head Cir --      Peak Flow --      Pain Score 03/30/16 1045 8     Pain Loc --      Pain Edu? --      Excl. in [place]? --     Constitutional: Alert and oriented. Well appearing and in no acute distress. Eyes: Conjunctivae are normal. PERRL. EOMI. Head: Atraumatic.  Mouth/Throat: Mucous membranes are moist.  Oropharynx non-erythematous. Neck: No stridor.  No cervical spine tenderness to palpation. Hematological/Lymphatic/Immunilogical: No cervical lymphadenopathy. Cardiovascular: Normal rate, regular rhythm. Grossly normal heart  sounds.  Good peripheral circulation. Respiratory: Normal respiratory effort.  No retractions. Lungs CTAB.No  wheezes, rales or rhonchi.  Gastrointestinal: Soft and nontender. No distention. Normal Bowel sounds. No CVA tenderness. Musculoskeletal: No lower or upper extremity tenderness nor edema. Bilateral pedal pulses equal and easily palpated.  Neurologic:  Normal speech and language. No gross focal neurologic deficits are appreciated. No gait instability. Skin:  Skin is warm, dry and intact. No rash noted. Except : Left posterior mid thoracic area clustered vesicular erythematous rash mild tender to palpation, no drainage, no fluctuance, no surrounding erythema, no induration, no other surrounding rash, rash does not cross midline.  Psychiatric: Mood and affect are normal. Speech and behavior are normal.  ____________________________________________   LABS (all labs ordered are listed, but only abnormal results are displayed)  Labs Reviewed - No data to display ____________________________________________   INITIAL IMPRESSION / ASSESSMENT AND PLAN / ED COURSE  Pertinent labs & imaging results that were available during my care of the patient were reviewed by me and considered in my medical decision making (see chart for details).  very well-appearing patient. No acute distress. Presents for the complaint of left posterior mid thoracic rash present since last night. Patient reports that she was seen in urgent care a few days ago for pain in the same area and the rash the appeared and pain improved. Mild pain at rash site only now per patient. Rash appearance clinically consistent with herpes zoster rash, patient to continue home tramadol as needed for pain. Will start patient on valacyclovir 1 g 3 times a day for 1 week. Encouraged PCP follow up next week. Encouraged monitoring area closely. Also discussed transmission including avoidance of pregnant individuals and other high risk patients.  Discussed indication, risks and benefits of medications with patient.  Discussed follow up with Primary care physician this week. Discussed follow up and return parameters including no resolution or any worsening concerns. Patient verbalized understanding and agreed to plan.   ____________________________________________   FINAL CLINICAL IMPRESSION(S) / ED DIAGNOSES  Final diagnoses:  Shingles     Discharge Medication List as of 03/30/2016 11:21 AM    START taking these medications   Details  valACYclovir (VALTREX) 1000 MG tablet Take 1 tablet (1,000 mg total) by mouth 3 (three) times daily. For 7 days, Starting 03/30/2016, Until Sat 04/13/16, Normal        Note: This dictation was prepared with Dragon dictation along with smaller phrase technology. Any transcriptional errors that result from this process are unintentional.       Marylene Land, NP 03/30/16 1258

## 2016-04-02 ENCOUNTER — Encounter: Payer: Self-pay | Admitting: Internal Medicine

## 2016-04-02 ENCOUNTER — Ambulatory Visit (INDEPENDENT_AMBULATORY_CARE_PROVIDER_SITE_OTHER): Payer: Medicare Other | Admitting: Internal Medicine

## 2016-04-02 VITALS — BP 122/78 | HR 84 | Temp 98.1°F | Resp 16 | Ht 69.0 in | Wt 177.0 lb

## 2016-04-02 DIAGNOSIS — B029 Zoster without complications: Secondary | ICD-10-CM | POA: Diagnosis not present

## 2016-04-02 MED ORDER — GABAPENTIN 300 MG PO CAPS: 300.0000 mg | ORAL_CAPSULE | Freq: Three times a day (TID) | ORAL | Status: DC

## 2016-04-02 NOTE — Patient Instructions (Signed)
Continue to take tylenol 500 mg up to 4 times per day.

## 2016-04-02 NOTE — Progress Notes (Signed)
Date:  04/02/2016   Name:  Cathy Murphy   DOB:  May 04, 1941   MRN:  [id number]   Chief Complaint: Herpes Zoster HPI Seen in UC on 5/24 with pain under her breast radiating to her mid back.  Early shingles without rash vs cardiac cause was suspected.  Then on 03/30/16 she went back to UC with a rash in the same distribution and was treated with Valtrex.  She is fairly uncomfortable - the tramadol has not helped with pain.  She is taking some tylenol.  She stayed out of work today and is not sure she is up to returning yet.  Review of Systems  Constitutional: Positive for fatigue. Negative for fever and chills.  Respiratory: Negative for cough, chest tightness and shortness of breath.   Cardiovascular: Negative for chest pain and palpitations.  Musculoskeletal: Positive for myalgias.  Skin: Positive for rash.  Neurological: Negative for dizziness and headaches.    Patient Active Problem List   Diagnosis Date Noted  . Type 2 diabetes mellitus ([place]) 03/11/2016  . Dyslipidemia 03/11/2016  . Ventral hernia without obstruction or gangrene 03/06/2016  . Controlled type 2 diabetes mellitus without complication, with long-term current use of insulin ([place]) 11/09/2015  . Rhinitis, allergic 11/09/2015  . Plantar fasciitis, right 06/16/2015  . Knee torn cartilage 04/26/2015  . Allergic rhinitis, seasonal 04/26/2015  . Essential (primary) hypertension 04/26/2015  . Glaucoma 04/26/2015  . 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 Insomnia 04/26/2015  . Impaired renal function 04/26/2015    Prior to Admission medications   Medication Sig Start Date End Date Taking? Authorizing Provider  amLODipine (NORVASC) 5 MG tablet TAKE 1 TABLET BY MOUTH EVERY DAY 03/05/16  Yes Glean Hess, MD  atorvastatin (LIPITOR) 80 MG tablet TAKE 1 TABLET BY MOUTH EVERY DAY 12/21/15  Yes Glean Hess, MD  B-D UF III MINI PEN NEEDLES 31G X 5 MM MISC USE AS DIRECTED WITH LEVEMIR 07/24/15   Yes Glean Hess, MD  cholecalciferol (VITAMIN D) 1000 units tablet Take 2,000 Units by mouth daily.   Yes Historical Provider, MD  ciprofloxacin (CIPRO) 250 MG tablet Take 1 tablet (250 mg total) by mouth 2 (two) times daily. 03/06/16  Yes Glean Hess, MD  glipiZIDE (GLUCOTROL) 5 MG tablet TAKE 1 TABLET BY MOUTH TWICE A DAY 02/14/16  Yes Glean Hess, MD  glucose blood (BAYER CONTOUR TEST) test strip  06/07/10  Yes Historical Provider, MD  JANUVIA 100 MG tablet TAKE 1 TABLET BY MOUTH EVERY DAY 02/22/16  Yes Glean Hess, MD  LEVEMIR FLEXTOUCH 100 UNIT/ML Pen USE 20 UNITS SUBCUTANEOUSLY EVERY DAY 02/27/16  Yes Glean Hess, MD  lisinopril (PRINIVIL,ZESTRIL) 40 MG tablet Take 1 tablet (40 mg total) by mouth daily. 11/09/15  Yes Glean Hess, MD  LUMIGAN 0.01 % SOLN TAKE 1 DROP(S) IN BOTH EYES ONCE IN THE EVENING 03/01/15  Yes Historical Provider, MD  pioglitazone (ACTOS) 15 MG tablet TAKE 1 TABLET BY MOUTH EVERY DAY 03/07/16  Yes Glean Hess, MD  timolol (TIMOPTIC) 0.5 % ophthalmic solution Place 1 drop into both eyes. 02/15/16  Yes Historical Provider, MD  traMADol (ULTRAM) 50 MG tablet Take 1 tablet (50 mg total) by mouth every 6 (six) hours as needed. 03/27/16  Yes Frederich Cha, MD  traZODone (DESYREL) 50 MG tablet TAKE 1 TABLET BY MOUTH AT BEDTIME 02/22/16  Yes Glean Hess, MD  valACYclovir (VALTREX) 1000 MG tablet Take 1 tablet (  1,000 mg total) by mouth 3 (three) times daily. For 7 days 03/30/16 04/13/16 Yes Marylene Land, NP    Allergies  Allergen Reactions  . Amoxicillin Diarrhea  . Aspirin Nausea And Vomiting  . Metformin And Related Diarrhea  . Sulfa Antibiotics     Past Surgical History  Procedure Laterality Date  . Abdominal hysterectomy      Social History  Substance Use Topics  . Smoking status: Never Smoker   . Smokeless tobacco: None  . Alcohol Use: No     Medication list has been reviewed and updated.   Physical Exam  Constitutional: She is  oriented to person, place, and time. She appears well-developed. She appears distressed (uncomfortable appearing).  HENT:  Head: Normocephalic and atraumatic.  Pulmonary/Chest: Effort normal. No respiratory distress.  Musculoskeletal: Normal range of motion.  Neurological: She is alert and oriented to person, place, and time.  Skin: Skin is warm and dry. Rash noted. There is erythema.     Three patches of drying vesicles on a red base  Psychiatric: She has a normal mood and affect. Her behavior is normal. Thought content normal.    BP 122/78 mmHg  Pulse 84  Temp(Src) 98.1 F (36.7 C)  Resp 16  Ht 5\' 9"  (1.753 m)  Wt 177 lb (80.287 kg)  BMI 26.13 kg/m2  SpO2 100%  Assessment and Plan: 1. Zoster without complications Take tylenol 500 mg qid Add gabapentin Finish valtrex Note to be out of work until 04/08/16. - gabapentin (NEURONTIN) 300 MG capsule; Take 1 capsule (300 mg total) by mouth 3 (three) times daily.  Dispense: 90 capsule; Refill: [place], MD [facility]  04/02/2016

## 2016-04-03 ENCOUNTER — Ambulatory Visit: Payer: Medicare Other | Admitting: General Surgery

## 2016-04-05 ENCOUNTER — Telehealth: Payer: Self-pay

## 2016-04-05 ENCOUNTER — Other Ambulatory Visit: Payer: Self-pay | Admitting: Internal Medicine

## 2016-04-05 MED ORDER — ACETAMINOPHEN-CODEINE 300-30 MG PO TABS: 1.0000 | ORAL_TABLET | ORAL | Status: DC | PRN

## 2016-04-05 NOTE — Telephone Encounter (Signed)
Prescription for tylenol #3 left a front desk.

## 2016-04-05 NOTE — Telephone Encounter (Signed)
Tried to call patient but there was no answer.

## 2016-04-05 NOTE — Telephone Encounter (Signed)
Left message that meds are not working. She then called back to see if we had gotten her message about the pain meds and I advised we will have to call her later after patient visits.

## 2016-04-12 ENCOUNTER — Encounter: Payer: Self-pay | Admitting: Internal Medicine

## 2016-04-12 ENCOUNTER — Ambulatory Visit (INDEPENDENT_AMBULATORY_CARE_PROVIDER_SITE_OTHER): Payer: Medicare Other | Admitting: Internal Medicine

## 2016-04-12 VITALS — BP 118/78 | HR 76 | Resp 16 | Ht 69.0 in | Wt 177.0 lb

## 2016-04-12 DIAGNOSIS — B0229 Other postherpetic nervous system involvement: Secondary | ICD-10-CM | POA: Diagnosis not present

## 2016-04-12 MED ORDER — GABAPENTIN 300 MG PO CAPS: 600.0000 mg | ORAL_CAPSULE | Freq: Three times a day (TID) | ORAL | Status: DC

## 2016-04-12 MED ORDER — HYDROCODONE-ACETAMINOPHEN 5-325 MG PO TABS
1.0000 | ORAL_TABLET | Freq: Three times a day (TID) | ORAL | Status: DC | PRN
Start: 2016-04-12 — End: 2016-09-18

## 2016-04-12 NOTE — Patient Instructions (Signed)
Take gabapentin 600 mg three times per day - call on Monday if not helping  Take Vicodin 1-2 three times a day for severe pain when not at work or driving  Take Tylenol #3 at work if needed

## 2016-04-12 NOTE — Progress Notes (Signed)
Date:  04/12/2016   Name:  Cathy Murphy   DOB:  1941-03-24   MRN:  [id number]   Chief Complaint: Herpes Zoster Patient was diagnosed with shingles about 2 weeks ago.  She completed valtrex.  She was seen 10 days ago with significant pain and was given gabapentin and tylenol #3.  She reports that the gabapentin did not seem to help.  The tylenol #3 helps intermittently.  She has returned to work but is very uncomfortable.  She was on the verge of going to the ER thinking that something else was causing her pain since the rash has almost resolved.   Review of Systems  Constitutional: Positive for fatigue. Negative for fever and chills.  Respiratory: Negative for cough, chest tightness and shortness of breath.   Cardiovascular: Negative for chest pain, palpitations and leg swelling.  Skin: Positive for rash (resolving but severe pain in the distribution of the zoster).  Hematological: Negative for adenopathy.  Psychiatric/Behavioral: Positive for sleep disturbance and dysphoric mood.    Patient Active Problem List   Diagnosis Date Noted  . Type 2 diabetes mellitus ([place]) 03/11/2016  . Dyslipidemia 03/11/2016  . Ventral hernia without obstruction or gangrene 03/06/2016  . Controlled type 2 diabetes mellitus without complication, with long-term current use of insulin ([place]) 11/09/2015  . Rhinitis, allergic 11/09/2015  . Plantar fasciitis, right 06/16/2015  . Knee torn cartilage 04/26/2015  . Allergic rhinitis, seasonal 04/26/2015  . Essential (primary) hypertension 04/26/2015  . Glaucoma 04/26/2015  . 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 Insomnia 04/26/2015  . Impaired renal function 04/26/2015    Prior to Admission medications   Medication Sig Start Date End Date Taking? Authorizing Provider  Acetaminophen-Codeine 300-30 MG tablet Take 1 tablet by mouth every 4 (four) hours as needed for pain. 04/05/16  Yes Glean Hess, MD  amLODipine (NORVASC) 5 MG  tablet TAKE 1 TABLET BY MOUTH EVERY DAY 03/05/16  Yes Glean Hess, MD  atorvastatin (LIPITOR) 80 MG tablet TAKE 1 TABLET BY MOUTH EVERY DAY 12/21/15  Yes Glean Hess, MD  B-D UF III MINI PEN NEEDLES 31G X 5 MM MISC USE AS DIRECTED WITH LEVEMIR 07/24/15  Yes Glean Hess, MD  cholecalciferol (VITAMIN D) 1000 units tablet Take 2,000 Units by mouth daily.   Yes Historical Provider, MD  gabapentin (NEURONTIN) 300 MG capsule Take 1 capsule (300 mg total) by mouth 3 (three) times daily. 04/02/16  Yes Glean Hess, MD  glipiZIDE (GLUCOTROL) 5 MG tablet TAKE 1 TABLET BY MOUTH TWICE A DAY 02/14/16  Yes Glean Hess, MD  glucose blood (BAYER CONTOUR TEST) test strip  06/07/10  Yes Historical Provider, MD  JANUVIA 100 MG tablet TAKE 1 TABLET BY MOUTH EVERY DAY 02/22/16  Yes Glean Hess, MD  LEVEMIR FLEXTOUCH 100 UNIT/ML Pen USE 20 UNITS SUBCUTANEOUSLY EVERY DAY 02/27/16  Yes Glean Hess, MD  lisinopril (PRINIVIL,ZESTRIL) 40 MG tablet Take 1 tablet (40 mg total) by mouth daily. 11/09/15  Yes Glean Hess, MD  LUMIGAN 0.01 % SOLN TAKE 1 DROP(S) IN BOTH EYES ONCE IN THE EVENING 03/01/15  Yes Historical Provider, MD  pioglitazone (ACTOS) 15 MG tablet TAKE 1 TABLET BY MOUTH EVERY DAY 03/07/16  Yes Glean Hess, MD  timolol (TIMOPTIC) 0.5 % ophthalmic solution Place 1 drop into both eyes. 02/15/16  Yes Historical Provider, MD  traMADol (ULTRAM) 50 MG tablet Take 1 tablet (50 mg total) by mouth every 6 (  six) hours as needed. 03/27/16  Yes Frederich Cha, MD  traZODone (DESYREL) 50 MG tablet TAKE 1 TABLET BY MOUTH AT BEDTIME 02/22/16  Yes Glean Hess, MD  valACYclovir (VALTREX) 1000 MG tablet Take 1 tablet (1,000 mg total) by mouth 3 (three) times daily. For 7 days 03/30/16 04/13/16 Yes Marylene Land, NP    Allergies  Allergen Reactions  . Amoxicillin Diarrhea  . Aspirin Nausea And Vomiting  . Metformin And Related Diarrhea  . Sulfa Antibiotics     Past Surgical History  Procedure  Laterality Date  . Abdominal hysterectomy      Social History  Substance Use Topics  . Smoking status: Never Smoker   . Smokeless tobacco: None  . Alcohol Use: No     Medication list has been reviewed and updated.   Physical Exam  Constitutional: She is oriented to person, place, and time. She appears well-developed. She appears distressed.  Cardiovascular: Normal rate, regular rhythm and normal heart sounds.   Pulmonary/Chest: Effort normal and breath sounds normal.  Neurological: She is alert and oriented to person, place, and time.  Skin:     Extremely sensitive to touch along the affected dermatome    BP 118/78 mmHg  Pulse 76  Resp 16  Ht 5\' 9"  (1.753 m)  Wt 177 lb (80.287 kg)  BMI 26.13 kg/m2  SpO2 100%  Assessment and Plan: 1. Post herpetic neuralgia Moderately severe pain not controlled Resume gabapentin at 600 mg tid - call in 4 days if not helping for consideration of Lyrica Take Tylenol #3 at work since not sedating Use hydrocodone at home 1-2 tid PRN   - HYDROcodone-acetaminophen (NORCO/VICODIN) 5-325 MG tablet; Take 1-2 tablets by mouth 3 (three) times daily as needed for moderate pain or severe pain.  Dispense: 100 tablet; Refill: 0 - gabapentin (NEURONTIN) 300 MG capsule; Take 2 capsules (600 mg total) by mouth 3 (three) times daily.  Dispense: 90 capsule; Refill: 0   Halina Maidens, MD [facility]  04/12/2016

## 2016-05-03 DIAGNOSIS — H401132 Primary open-angle glaucoma, bilateral, moderate stage: Secondary | ICD-10-CM | POA: Diagnosis not present

## 2016-05-29 ENCOUNTER — Other Ambulatory Visit: Payer: Self-pay | Admitting: Internal Medicine

## 2016-07-10 ENCOUNTER — Ambulatory Visit: Payer: Self-pay | Admitting: Internal Medicine

## 2016-09-06 DIAGNOSIS — H401132 Primary open-angle glaucoma, bilateral, moderate stage: Secondary | ICD-10-CM | POA: Diagnosis not present

## 2016-09-18 ENCOUNTER — Ambulatory Visit (INDEPENDENT_AMBULATORY_CARE_PROVIDER_SITE_OTHER): Payer: Medicare Other | Admitting: Internal Medicine

## 2016-09-18 ENCOUNTER — Encounter: Payer: Self-pay | Admitting: Internal Medicine

## 2016-09-18 VITALS — BP 118/76 | HR 64 | Temp 98.2°F | Resp 16 | Ht 69.0 in | Wt 176.0 lb

## 2016-09-18 DIAGNOSIS — J40 Bronchitis, not specified as acute or chronic: Secondary | ICD-10-CM | POA: Diagnosis not present

## 2016-09-18 MED ORDER — DOXYCYCLINE HYCLATE 100 MG PO TABS: 100.0000 mg | ORAL_TABLET | Freq: Two times a day (BID) | ORAL | 0 refills | Status: DC

## 2016-09-18 MED ORDER — BENZONATATE 100 MG PO CAPS: 100.0000 mg | ORAL_CAPSULE | Freq: Three times a day (TID) | ORAL | 0 refills | Status: DC

## 2016-09-18 NOTE — Progress Notes (Signed)
Date:  09/18/2016   Name:  Cathy Murphy   DOB:  08/20/1941   MRN:  [id number]   Chief Complaint: Cough (Since 09/07/2016 non procudtive. Sneezing and headache and had fever in beginning. )  Cough  This is a new problem. The current episode started 1 to 4 weeks ago. The problem has been unchanged. The problem occurs every few hours. The cough is non-productive. Associated symptoms include nasal congestion and postnasal drip. Pertinent negatives include no chest pain, chills, fever, headaches, sore throat, shortness of breath or wheezing.  ]   Review of Systems  Constitutional: Negative for chills and fever.  HENT: Positive for congestion, postnasal drip, sinus pressure and sneezing. Negative for ear discharge, sore throat, trouble swallowing and voice change.   Respiratory: Positive for cough. Negative for chest tightness, shortness of breath and wheezing.   Cardiovascular: Negative for chest pain and palpitations.  Gastrointestinal: Negative for abdominal pain, diarrhea and vomiting.  Neurological: Negative for dizziness and headaches.    Patient Active Problem List   Diagnosis Date Noted  . Ventral hernia without obstruction or gangrene 03/06/2016  . Controlled type 2 diabetes mellitus without complication, with long-term current use of insulin ([place]) 11/09/2015  . Plantar fasciitis, right 06/16/2015  . Knee torn cartilage 04/26/2015  . Allergic rhinitis, seasonal 04/26/2015  . Essential (primary) hypertension 04/26/2015  . Glaucoma 04/26/2015  . Hyperlipidemia associated with type 2 diabetes mellitus ([place]) 04/26/2015  . Insomnia 04/26/2015  . Impaired renal function 04/26/2015    Prior to Admission medications   Medication Sig Start Date End Date Taking? Authorizing Provider  Acetaminophen-Codeine 300-30 MG tablet Take 1 tablet by mouth every 4 (four) hours as needed for pain. 04/05/16   Glean Hess, MD  amLODipine (NORVASC) 5 MG tablet TAKE 1 TABLET BY MOUTH EVERY DAY  03/05/16   Glean Hess, MD  atorvastatin (LIPITOR) 80 MG tablet TAKE 1 TABLET BY MOUTH EVERY DAY 12/21/15   Glean Hess, MD  B-D UF III MINI PEN NEEDLES 31G X 5 MM MISC USE AS DIRECTED WITH LEVEMIR 05/29/16   Glean Hess, MD  cholecalciferol (VITAMIN D) 1000 units tablet Take 2,000 Units by mouth daily.    Historical Provider, MD  gabapentin (NEURONTIN) 300 MG capsule Take 2 capsules (600 mg total) by mouth 3 (three) times daily. 04/12/16   Glean Hess, MD  glipiZIDE (GLUCOTROL) 5 MG tablet TAKE 1 TABLET BY MOUTH TWICE A DAY 02/14/16   Glean Hess, MD  glucose blood (BAYER CONTOUR TEST) test strip  06/07/10   Historical Provider, MD  HYDROcodone-acetaminophen (NORCO/VICODIN) 5-325 MG tablet Take 1-2 tablets by mouth 3 (three) times daily as needed for moderate pain or severe pain. 04/12/16   Glean Hess, MD  JANUVIA 100 MG tablet TAKE 1 TABLET BY MOUTH EVERY DAY 02/22/16   Glean Hess, MD  LEVEMIR FLEXTOUCH 100 UNIT/ML Pen USE 20 UNITS SUBCUTANEOUSLY EVERY DAY 02/27/16   Glean Hess, MD  lisinopril (PRINIVIL,ZESTRIL) 40 MG tablet Take 1 tablet (40 mg total) by mouth daily. 11/09/15   Glean Hess, MD  LUMIGAN 0.01 % SOLN TAKE 1 DROP(S) IN BOTH EYES ONCE IN THE EVENING 03/01/15   Historical Provider, MD  pioglitazone (ACTOS) 15 MG tablet TAKE 1 TABLET BY MOUTH EVERY DAY 03/07/16   Glean Hess, MD  timolol (TIMOPTIC) 0.5 % ophthalmic solution Place 1 drop into both eyes. 02/15/16   Historical Provider, MD  traMADol Veatrice Bourbon)  50 MG tablet Take 1 tablet (50 mg total) by mouth every 6 (six) hours as needed. 03/27/16   Frederich Cha, MD  traZODone (DESYREL) 50 MG tablet TAKE 1 TABLET BY MOUTH AT BEDTIME 02/22/16   Glean Hess, MD    Allergies  Allergen Reactions  . Amoxicillin Diarrhea  . Aspirin Nausea And Vomiting  . Metformin And Related Diarrhea  . Oxycodone Other (See Comments)    CNS side effects/agitation  . Sulfa Antibiotics     Past Surgical History:    Procedure Laterality Date  . ABDOMINAL HYSTERECTOMY      Social History  Substance Use Topics  . Smoking status: Never Smoker  . Smokeless tobacco: Not on file  . Alcohol use No     Medication list has been reviewed and updated.   Physical Exam  Constitutional: She is oriented to person, place, and time. She appears well-developed. No distress.  HENT:  Head: Normocephalic and atraumatic.  Right Ear: Tympanic membrane and ear canal normal.  Left Ear: Tympanic membrane and ear canal normal.  Nose: Right sinus exhibits no maxillary sinus tenderness and no frontal sinus tenderness. Left sinus exhibits no maxillary sinus tenderness and no frontal sinus tenderness.  Mouth/Throat: No posterior oropharyngeal erythema.  Cardiovascular: Normal rate and regular rhythm.   Pulmonary/Chest: Effort normal. No respiratory distress. She has decreased breath sounds. She has no wheezes. She has no rhonchi.  Musculoskeletal: Normal range of motion.  Neurological: She is alert and oriented to person, place, and time.  Skin: Skin is warm and dry. No rash noted.  Psychiatric: She has a normal mood and affect. Her behavior is normal. Thought content normal.  Nursing note and vitals reviewed.   BP 118/76   Pulse 64   Temp 98.2 F (36.8 C) (Oral)   Resp 16   Ht 5\' 9"  (1.753 m)   Wt 176 lb (79.8 kg)   SpO2 100%   BMI 25.99 kg/m   Assessment and Plan: 1. Bronchitis Increase fluids Take claritin 10 mg once a day for PND - doxycycline (VIBRA-TABS) 100 MG tablet; Take 1 tablet (100 mg total) by mouth 2 (two) times daily.  Dispense: 20 tablet; Refill: 0 - benzonatate (TESSALON) 100 MG capsule; Take 1 capsule (100 mg total) by mouth 3 (three) times daily.  Dispense: 30 capsule; Refill: 0   Halina Maidens, MD [facility]  09/18/2016

## 2016-09-18 NOTE — Patient Instructions (Signed)
Claritin 10 mg  -  Once a day for sneezing and drainage

## 2016-09-20 DIAGNOSIS — H401132 Primary open-angle glaucoma, bilateral, moderate stage: Secondary | ICD-10-CM | POA: Diagnosis not present

## 2016-11-13 ENCOUNTER — Encounter: Payer: Self-pay | Admitting: Internal Medicine

## 2016-11-13 ENCOUNTER — Ambulatory Visit (INDEPENDENT_AMBULATORY_CARE_PROVIDER_SITE_OTHER): Payer: Medicare Other | Admitting: Internal Medicine

## 2016-11-13 VITALS — BP 128/82 | HR 66 | Temp 98.1°F | Ht 69.0 in | Wt 171.0 lb

## 2016-11-13 DIAGNOSIS — J0100 Acute maxillary sinusitis, unspecified: Secondary | ICD-10-CM

## 2016-11-13 DIAGNOSIS — I10 Essential (primary) hypertension: Secondary | ICD-10-CM | POA: Diagnosis not present

## 2016-11-13 DIAGNOSIS — Z794 Long term (current) use of insulin: Secondary | ICD-10-CM

## 2016-11-13 DIAGNOSIS — N289 Disorder of kidney and ureter, unspecified: Secondary | ICD-10-CM | POA: Diagnosis not present

## 2016-11-13 DIAGNOSIS — E119 Type 2 diabetes mellitus without complications: Secondary | ICD-10-CM | POA: Diagnosis not present

## 2016-11-13 MED ORDER — AZITHROMYCIN 250 MG PO TABS: ORAL_TABLET | ORAL | 0 refills | Status: DC

## 2016-11-13 NOTE — Progress Notes (Signed)
Date:  11/13/2016   Name:  Cathy Murphy   DOB:  07-10-1941   MRN:  [id number]   Chief Complaint: Diabetes (Pt stated sugar 133 this morning and repeat blood work) Diabetes  She presents for her follow-up diabetic visit. She has type 2 diabetes mellitus. Her disease course has been stable. Associated symptoms include fatigue. Pertinent negatives for diabetes include no chest pain. Symptoms are stable. Her breakfast blood glucose is taken between 6-7 am. Her breakfast blood glucose range is generally 110-130 mg/dl. An ACE inhibitor/angiotensin II receptor blocker is being taken.  Hypertension  This is a chronic problem. The current episode started more than 1 year ago. The problem is unchanged. The problem is controlled. Pertinent negatives include no chest pain or palpitations.  Cough  This is a new problem. The current episode started in the past 7 days. The problem has been unchanged. The problem occurs hourly. The cough is productive of sputum. Associated symptoms include heartburn and a sore throat. Pertinent negatives include no chest pain, chills, ear pain, fever or wheezing. She has tried OTC cough suppressant for the symptoms. The treatment provided mild relief.    Lab Results  Component Value Date   HGBA1C 7.8 (H) 03/06/2016   Lab Results  Component Value Date   CREATININE 0.88 03/27/2016     Review of Systems  Constitutional: Positive for fatigue. Negative for chills and fever.  HENT: Positive for congestion, sneezing and sore throat. Negative for ear pain.   Eyes: Negative for visual disturbance.  Respiratory: Positive for cough. Negative for chest tightness and wheezing.   Cardiovascular: Negative for chest pain, palpitations and leg swelling.  Gastrointestinal: Positive for heartburn.    Patient Active Problem List   Diagnosis Date Noted  . Ventral hernia without obstruction or gangrene 03/06/2016  . Controlled type 2 diabetes mellitus without complication, with  long-term current use of insulin ([place]) 11/09/2015  . Plantar fasciitis, right 06/16/2015  . Knee torn cartilage 04/26/2015  . Allergic rhinitis, seasonal 04/26/2015  . Essential (primary) hypertension 04/26/2015  . Glaucoma 04/26/2015  . Hyperlipidemia associated with type 2 diabetes mellitus ([place]) 04/26/2015  . Insomnia 04/26/2015  . Impaired renal function 04/26/2015    Prior to Admission medications   Medication Sig Start Date End Date Taking? Authorizing Provider  amLODipine (NORVASC) 5 MG tablet TAKE 1 TABLET BY MOUTH EVERY DAY 03/05/16  Yes Glean Hess, MD  atorvastatin (LIPITOR) 80 MG tablet TAKE 1 TABLET BY MOUTH EVERY DAY 12/21/15  Yes Glean Hess, MD  B-D UF III MINI PEN NEEDLES 31G X 5 MM MISC USE AS DIRECTED WITH LEVEMIR 05/29/16  Yes Glean Hess, MD  cholecalciferol (VITAMIN D) 1000 units tablet Take 2,000 Units by mouth daily.   Yes Historical Provider, MD  gabapentin (NEURONTIN) 300 MG capsule Take 2 capsules (600 mg total) by mouth 3 (three) times daily. 04/12/16  Yes Glean Hess, MD  glipiZIDE (GLUCOTROL) 5 MG tablet TAKE 1 TABLET BY MOUTH TWICE A DAY 02/14/16  Yes Glean Hess, MD  glucose blood (BAYER CONTOUR TEST) test strip  06/07/10  Yes Historical Provider, MD  LEVEMIR FLEXTOUCH 100 UNIT/ML Pen USE 20 UNITS SUBCUTANEOUSLY EVERY DAY 02/27/16  Yes Glean Hess, MD  lisinopril (PRINIVIL,ZESTRIL) 40 MG tablet Take 1 tablet (40 mg total) by mouth daily. 11/09/15  Yes Glean Hess, MD  LUMIGAN 0.01 % SOLN TAKE 1 DROP(S) IN BOTH EYES ONCE IN THE EVENING 03/01/15  Yes  Historical Provider, MD  pioglitazone (ACTOS) 15 MG tablet TAKE 1 TABLET BY MOUTH EVERY DAY 03/07/16  Yes Glean Hess, MD  sitaGLIPtin (JANUVIA) 100 MG tablet  06/22/10  Yes Historical Provider, MD  timolol (TIMOPTIC) 0.5 % ophthalmic solution Place 1 drop into both eyes. 02/15/16  Yes Historical Provider, MD  traZODone (DESYREL) 50 MG tablet TAKE 1 TABLET BY MOUTH AT BEDTIME 02/22/16  Yes  Glean Hess, MD  traMADol (ULTRAM) 50 MG tablet Take 1 tablet (50 mg total) by mouth every 6 (six) hours as needed. Patient not taking: Reported on 11/13/2016 03/27/16   Frederich Cha, MD    Allergies  Allergen Reactions  . Amoxicillin Diarrhea  . Aspirin Nausea And Vomiting  . Metformin And Related Diarrhea  . Oxycodone Other (See Comments)    CNS side effects/agitation  . Sulfa Antibiotics     Past Surgical History:  Procedure Laterality Date  . ABDOMINAL HYSTERECTOMY      Social History  Substance Use Topics  . Smoking status: Never Smoker  . Smokeless tobacco: Not on file  . Alcohol use No     Medication list has been reviewed and updated.   Physical Exam  Constitutional: She is oriented to person, place, and time. She appears well-developed. No distress.  HENT:  Head: Normocephalic and atraumatic.  Right Ear: Tympanic membrane and ear canal normal.  Left Ear: Tympanic membrane and ear canal normal.  Nose: Right sinus exhibits maxillary sinus tenderness. Left sinus exhibits maxillary sinus tenderness.  Mouth/Throat: Oropharynx is clear and moist. No posterior oropharyngeal erythema.  Neck: Normal range of motion. Neck supple.  Cardiovascular: Normal rate, regular rhythm and normal heart sounds.   Pulmonary/Chest: Effort normal and breath sounds normal. No respiratory distress. She has no wheezes. She has no rhonchi.  Musculoskeletal: Normal range of motion. She exhibits no edema.  Neurological: She is alert and oriented to person, place, and time.  Skin: Skin is warm and dry. No rash noted.  Psychiatric: She has a normal mood and affect. Her behavior is normal. Thought content normal.  Nursing note and vitals reviewed.   BP 128/82   Pulse 66   Temp 98.1 F (36.7 C)   Ht 5\' 9"  (1.753 m)   Wt 171 lb (77.6 kg)   SpO2 98%   BMI 25.25 kg/m   Assessment and Plan: 1. Controlled type 2 diabetes mellitus without complication, with long-term current use of  insulin ([facility]) Continue current medication - Hemoglobin A1c  2. Essential (primary) hypertension controlled  3. Impaired renal function Continue to monitor - Basic metabolic panel  4. Acute non-recurrent maxillary sinusitis - azithromycin (ZITHROMAX Z-PAK) 250 MG tablet; UAD  Dispense: 6 each; Refill: 0   Halina Maidens, MD [facility]  11/13/2016

## 2016-11-13 NOTE — Patient Instructions (Signed)
Use Flonase nasal spray daily  Use inhaler up to 4 times per day as needed  May continue over the counter cough syrup

## 2016-11-14 LAB — HEMOGLOBIN A1C
Est. average glucose Bld gHb Est-mCnc: 174 mg/dL
Hgb A1c MFr Bld: 7.7 % — ABNORMAL HIGH (ref 4.8–5.6)

## 2016-11-14 LAB — BASIC METABOLIC PANEL
BUN / CREAT RATIO: 18 (ref 12–28)
BUN: 17 mg/dL (ref 8–27)
CALCIUM: 9 mg/dL (ref 8.7–10.3)
CO2: 27 mmol/L (ref 18–29)
Chloride: 100 mmol/L (ref 96–106)
Creatinine, Ser: 0.97 mg/dL (ref 0.57–1.00)
GFR calc Af Amer: 67 mL/min/{1.73_m2} (ref >59)
GFR, EST NON AFRICAN AMERICAN: 58 mL/min/{1.73_m2} — AB (ref >59)
Glucose: 150 mg/dL — ABNORMAL HIGH (ref 65–99)
Potassium: 4.1 mmol/L (ref 3.5–5.2)
Sodium: 143 mmol/L (ref 134–144)

## 2016-12-01 ENCOUNTER — Other Ambulatory Visit: Payer: Self-pay | Admitting: Internal Medicine

## 2016-12-01 DIAGNOSIS — I10 Essential (primary) hypertension: Secondary | ICD-10-CM

## 2016-12-07 IMAGING — CR DG CHEST 2V
2 series · 2 of 2 positions shown · non-contrast
Comparison: CT Abdomen and Pelvis 03/20/2016.

CLINICAL DATA: 74-year-old female with chest pain, left posterior
rib and flank area pain for 3 days. Initial encounter.

EXAM:
CHEST  2 VIEW

[chest pa]
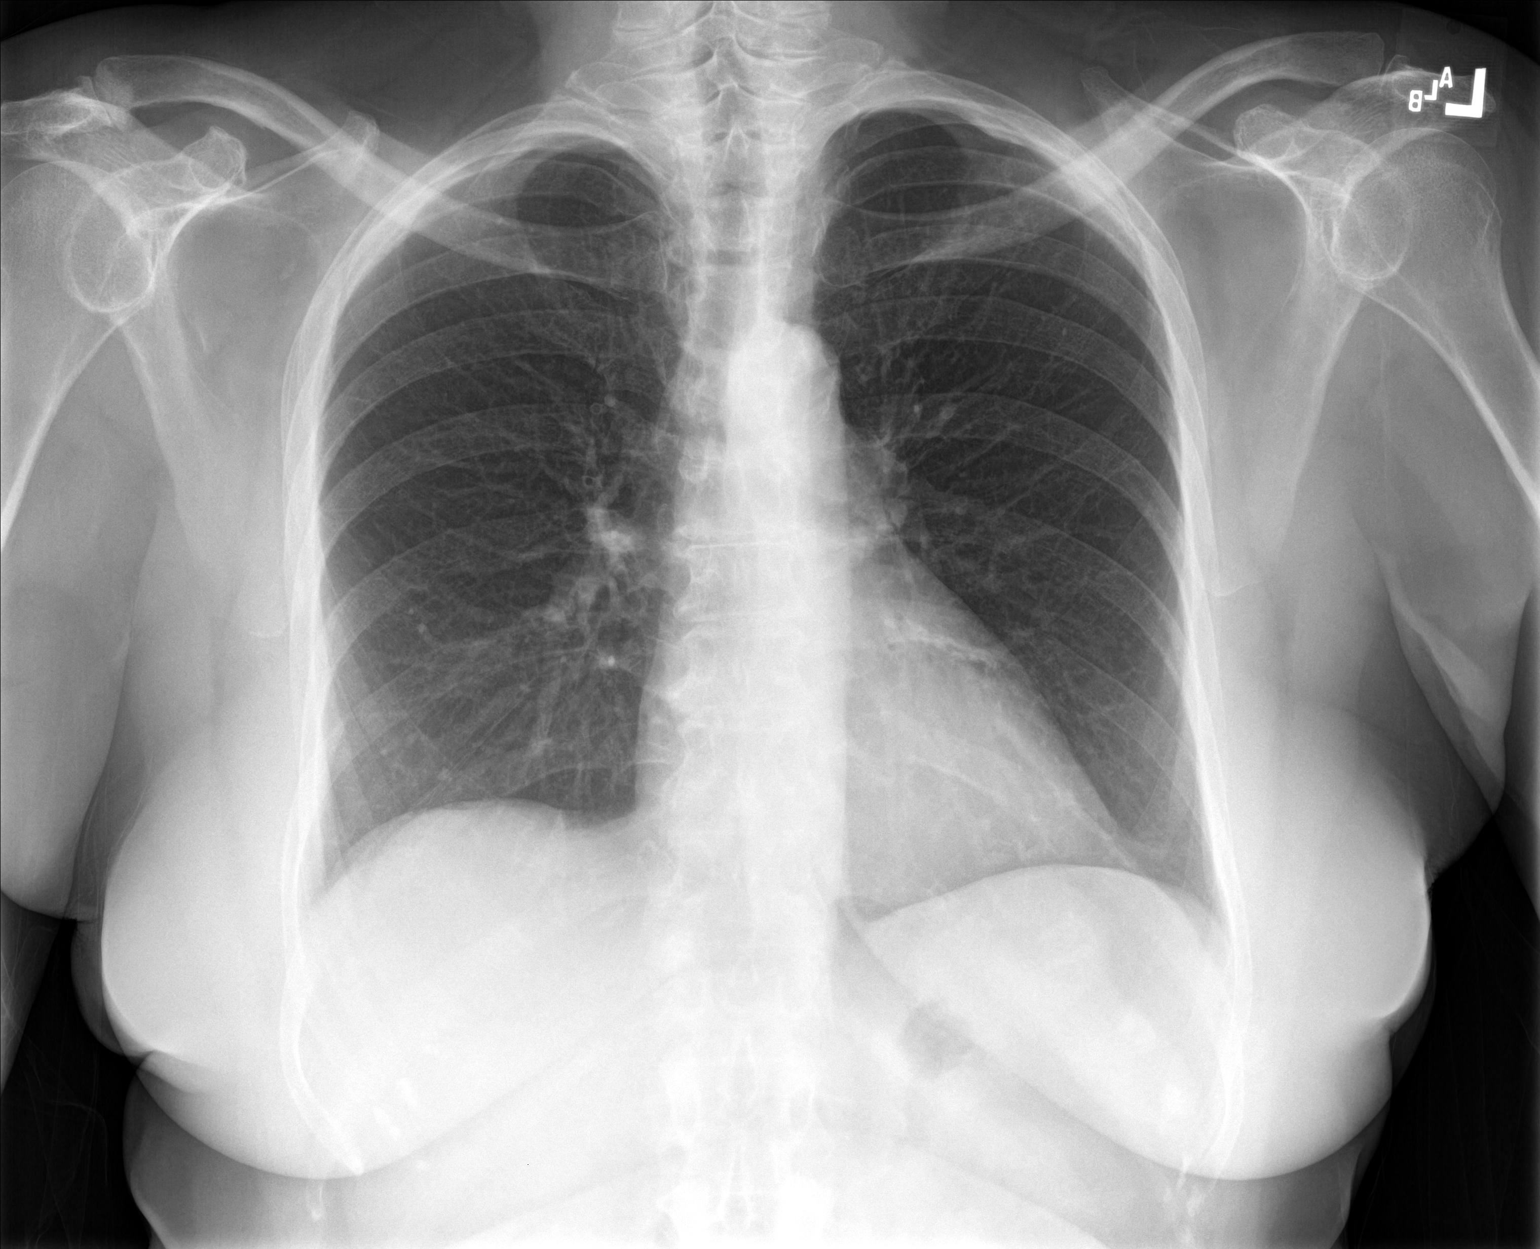

[chest lat]
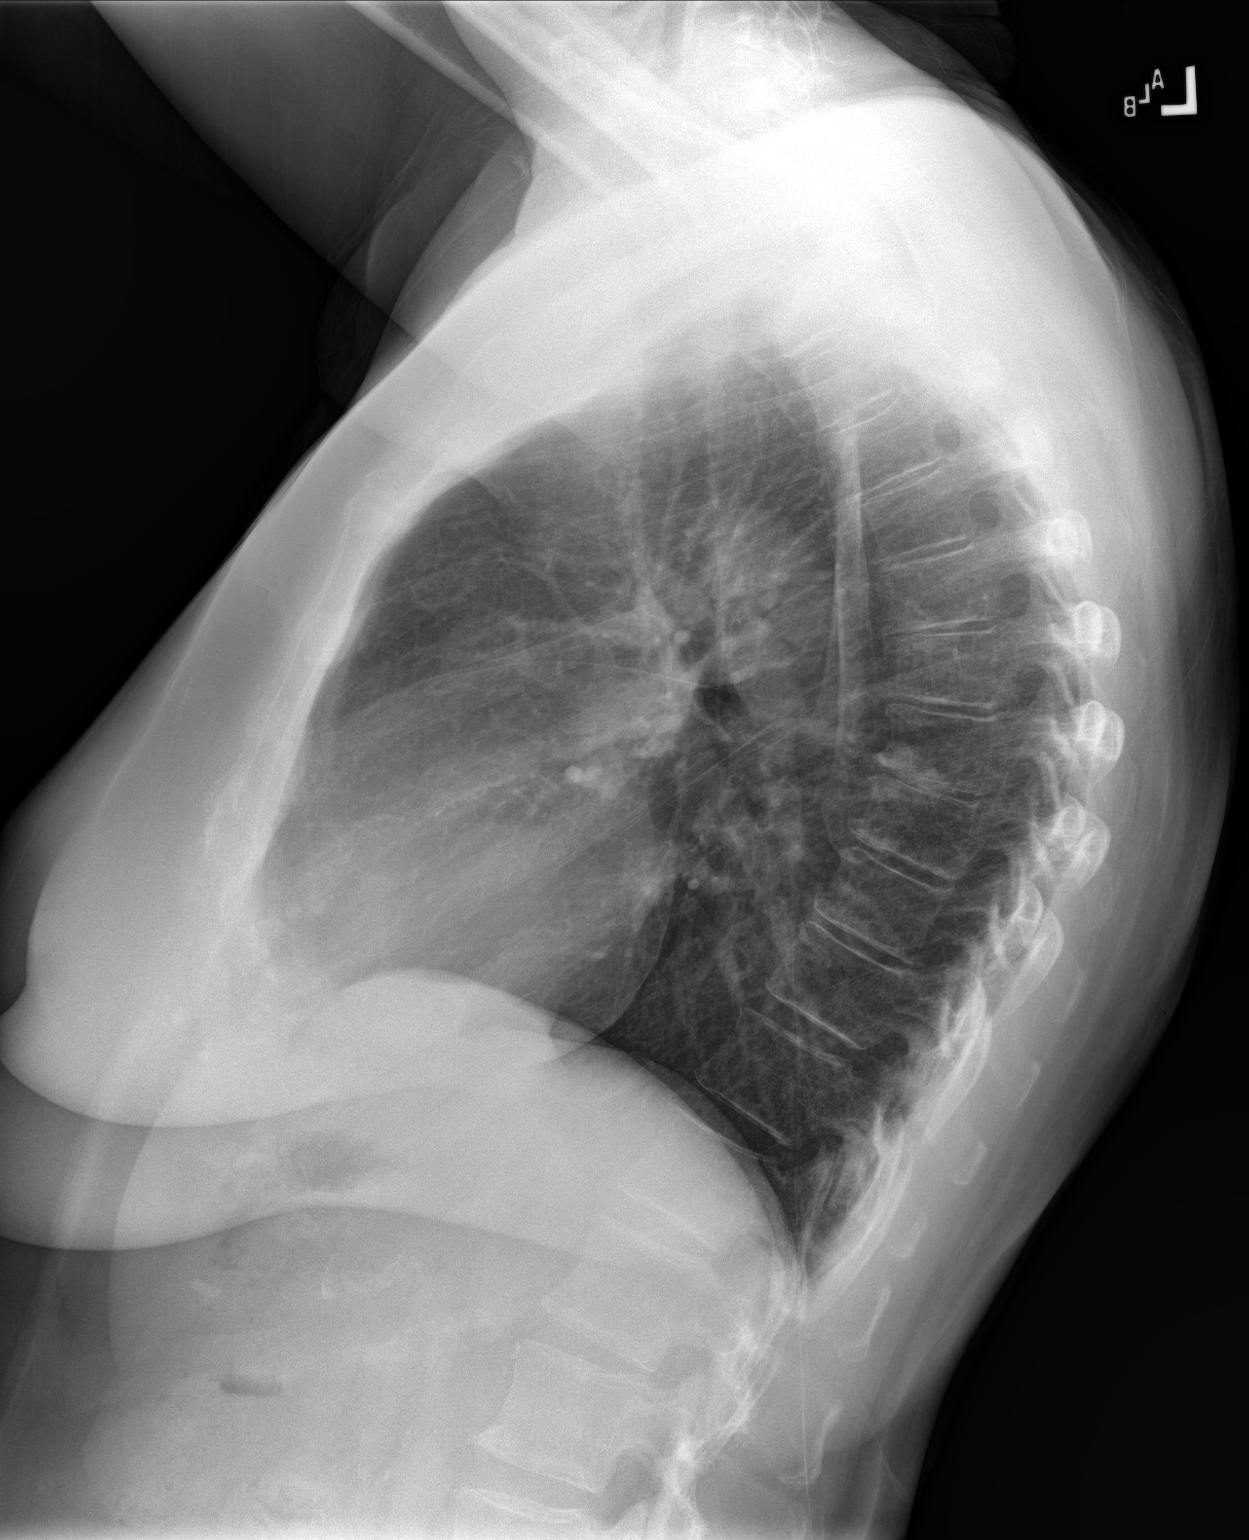

[2 of 2 positions shown; findings below may reference images not displayed]

FINDINGS: Normal lung volumes. Normal cardiac size and mediastinal contours.
Visualized tracheal air column is within normal limits. The lungs
are clear. No pneumothorax or effusion. No acute osseous abnormality
identified. Degenerative changes right acromioclavicular joint.
IMPRESSION: Negative, no acute cardiopulmonary abnormality.

## 2016-12-17 ENCOUNTER — Other Ambulatory Visit: Payer: Self-pay | Admitting: Internal Medicine

## 2017-01-01 ENCOUNTER — Other Ambulatory Visit: Payer: Self-pay | Admitting: Internal Medicine

## 2017-01-07 DIAGNOSIS — H401132 Primary open-angle glaucoma, bilateral, moderate stage: Secondary | ICD-10-CM | POA: Diagnosis not present

## 2017-02-01 ENCOUNTER — Other Ambulatory Visit: Payer: Self-pay | Admitting: Internal Medicine

## 2017-02-18 ENCOUNTER — Other Ambulatory Visit: Payer: Self-pay | Admitting: Internal Medicine

## 2017-03-01 ENCOUNTER — Other Ambulatory Visit: Payer: Self-pay | Admitting: Internal Medicine

## 2017-03-02 ENCOUNTER — Other Ambulatory Visit: Payer: Self-pay | Admitting: Internal Medicine

## 2017-03-13 ENCOUNTER — Other Ambulatory Visit: Payer: Self-pay | Admitting: Internal Medicine

## 2017-03-13 ENCOUNTER — Encounter: Payer: Self-pay | Admitting: Internal Medicine

## 2017-03-13 ENCOUNTER — Ambulatory Visit (INDEPENDENT_AMBULATORY_CARE_PROVIDER_SITE_OTHER): Payer: Medicare Other | Admitting: Internal Medicine

## 2017-03-13 VITALS — BP 130/64 | HR 73 | Ht 69.0 in | Wt 175.6 lb

## 2017-03-13 DIAGNOSIS — Z1231 Encounter for screening mammogram for malignant neoplasm of breast: Secondary | ICD-10-CM

## 2017-03-13 DIAGNOSIS — E785 Hyperlipidemia, unspecified: Secondary | ICD-10-CM

## 2017-03-13 DIAGNOSIS — E119 Type 2 diabetes mellitus without complications: Secondary | ICD-10-CM

## 2017-03-13 DIAGNOSIS — Z794 Long term (current) use of insulin: Secondary | ICD-10-CM

## 2017-03-13 DIAGNOSIS — Z1239 Encounter for other screening for malignant neoplasm of breast: Secondary | ICD-10-CM

## 2017-03-13 DIAGNOSIS — I10 Essential (primary) hypertension: Secondary | ICD-10-CM | POA: Diagnosis not present

## 2017-03-13 DIAGNOSIS — E1169 Type 2 diabetes mellitus with other specified complication: Secondary | ICD-10-CM

## 2017-03-13 DIAGNOSIS — M19019 Primary osteoarthritis, unspecified shoulder: Secondary | ICD-10-CM

## 2017-03-13 LAB — POCT URINALYSIS DIPSTICK
Bilirubin, UA: NEGATIVE
Glucose, UA: NEGATIVE
Ketones, UA: NEGATIVE
Leukocytes, UA: NEGATIVE
NITRITE UA: NEGATIVE
PH UA: 5 (ref 5.0–8.0)
SPEC GRAV UA: 1.015 (ref 1.010–1.025)
UROBILINOGEN UA: 0.2 U/dL

## 2017-03-13 NOTE — Progress Notes (Signed)
Date:  03/13/2017   Name:  Cathy Murphy   DOB:  [birth year]   MRN:  [id number]   Chief Complaint: Hypertension (Feet been swelling on and off. ) and Diabetes (BS-111. Yesterday was- 73) Hypertension  This is a chronic problem. The problem is controlled. Associated symptoms include sweats. Pertinent negatives include no chest pain (mild intermittent of ankles), headaches, palpitations or shortness of breath. Past treatments include calcium channel blockers and ACE inhibitors. There is no history of kidney disease or PVD.  Diabetes  She presents for her follow-up diabetic visit. She has type 2 diabetes mellitus. Her disease course has been stable. Hypoglycemia symptoms include sweats. Pertinent negatives for hypoglycemia include no headaches, nervousness/anxiousness or tremors. Pertinent negatives for diabetes include no chest pain (mild intermittent of ankles), no fatigue, no polydipsia and no polyuria. There are no hypoglycemic complications. Pertinent negatives for diabetic complications include no PVD. Risk factors for coronary artery disease include dyslipidemia. Current diabetic treatment includes insulin injections and oral agent (dual therapy). An ACE inhibitor/angiotensin II receptor blocker is being taken.  Shoulder Pain   The pain is present in the right shoulder. This is a new problem. The problem occurs daily. The problem has been unchanged. The quality of the pain is described as aching. The pain is mild. Associated symptoms include a limited range of motion. Pertinent negatives include no fever, numbness, stiffness or tingling. The symptoms are aggravated by activity. She has tried acetaminophen for the symptoms. The treatment provided moderate relief.   Lab Results  Component Value Date   HGBA1C 7.7 (H) 11/13/2016     Review of Systems  Constitutional: Negative for appetite change, fatigue, fever and unexpected weight change.  HENT: Negative for tinnitus  and trouble swallowing.   Eyes: Negative for visual disturbance.  Respiratory: Negative for cough, chest tightness and shortness of breath.   Cardiovascular: Positive for leg swelling. Negative for chest pain (mild intermittent of ankles) and palpitations.  Gastrointestinal: Negative for abdominal pain.  Endocrine: Negative for polydipsia and polyuria.  Genitourinary: Negative for dysuria and hematuria.  Musculoskeletal: Positive for arthralgias (shoulder). Negative for stiffness.  Neurological: Negative for tingling, tremors, numbness and headaches.  Hematological: Negative for adenopathy.  Psychiatric/Behavioral: Negative for dysphoric mood. The patient is not nervous/anxious.     Patient Active Problem List   Diagnosis Date Noted  . Ventral hernia without obstruction or gangrene 03/06/2016  . Controlled type 2 diabetes mellitus without complication, with long-term current use of insulin ([place]) 11/09/2015  . Plantar fasciitis, right 06/16/2015  . Knee torn cartilage 04/26/2015  . Allergic rhinitis, seasonal 04/26/2015  . Essential (primary) hypertension 04/26/2015  . Glaucoma 04/26/2015  . Hyperlipidemia associated with type 2 diabetes mellitus ([place]) 04/26/2015  . Insomnia 04/26/2015    Prior to Admission medications   Medication Sig Start Date End Date Taking? Authorizing Provider  amLODipine (NORVASC) 5 MG tablet TAKE 1 TABLET BY MOUTH EVERY DAY 02/01/17   Glean Hess, MD  atorvastatin (LIPITOR) 80 MG tablet TAKE 1 TABLET BY MOUTH EVERY DAY 12/17/16   Glean Hess, MD  B-D UF III MINI PEN NEEDLES 31G X 5 MM MISC USE AS DIRECTED WITH LEVEMIR 05/29/16   Glean Hess, MD  cholecalciferol (VITAMIN D) 1000 units tablet Take 2,000 Units by mouth daily.    [provider]  gabapentin (NEURONTIN) 300 MG capsule Take 2 capsules (600 mg total) by mouth 3 (three) times daily. 04/12/16   Glean Hess, MD  glipiZIDE (GLUCOTROL) 5 MG tablet TAKE 1 TABLET BY MOUTH TWICE  A DAY 02/18/17   Glean Hess, MD  glucose blood (BAYER CONTOUR TEST) test strip  06/07/10   [provider]  JANUVIA 100 MG tablet TAKE 1 TABLET BY MOUTH EVERY DAY 03/02/17   Glean Hess, MD  LEVEMIR FLEXTOUCH 100 UNIT/ML Pen INJECT 20 UNITS UNDER THE SKIN EVERY DAY 01/01/17   Glean Hess, MD  lisinopril (PRINIVIL,ZESTRIL) 40 MG tablet TAKE 1 TABLET (40 MG TOTAL) BY MOUTH DAILY. 12/01/16   Glean Hess, MD  LUMIGAN 0.01 % SOLN TAKE 1 DROP(S) IN BOTH EYES ONCE IN THE EVENING 03/01/15   [provider]  pioglitazone (ACTOS) 15 MG tablet TAKE 1 TABLET BY MOUTH EVERY DAY 03/02/17   Glean Hess, MD  timolol (TIMOPTIC) 0.5 % ophthalmic solution Place 1 drop into both eyes. 02/15/16   [provider]  traMADol (ULTRAM) 50 MG tablet Take 1 tablet (50 mg total) by mouth every 6 (six) hours as needed. Patient not taking: Reported on 11/13/2016 03/27/16   Frederich Cha, MD  traZODone (DESYREL) 50 MG tablet TAKE 1 TABLET BY MOUTH AT BEDTIME 03/01/17   Glean Hess, MD    Allergies  Allergen Reactions  . Amoxicillin Diarrhea  . Aspirin Nausea And Vomiting  . Metformin And Related Diarrhea  . Oxycodone Other (See Comments)    CNS side effects/agitation  . Sulfa Antibiotics     Past Surgical History:  Procedure Laterality Date  . ABDOMINAL HYSTERECTOMY      Social History  Substance Use Topics  . Smoking status: Never Smoker  . Smokeless tobacco: Never Used  . Alcohol use No   Depression screen [facility] 2/9 03/13/2017 03/06/2016 06/15/2015  Decreased Interest 0 1 0  Down, Depressed, Hopeless 0 - 0  PHQ - 2 Score 0 1 0     Medication list has been reviewed and updated.   Physical Exam  Constitutional: She is oriented to person, place, and time. She appears well-developed. No distress.  HENT:  Head: Normocephalic and atraumatic.  Neck: Normal range of motion. Neck supple. Carotid bruit is not present. No thyromegaly present.  Cardiovascular:  Normal rate, regular rhythm and normal heart sounds.   Pulmonary/Chest: Effort normal. No respiratory distress. She has no wheezes.  Abdominal: Soft. Bowel sounds are normal.  Musculoskeletal: She exhibits no edema or tenderness.       Right shoulder: She exhibits decreased range of motion. She exhibits no bony tenderness, no swelling, no effusion, normal pulse and normal strength.       Left shoulder: She exhibits normal range of motion, no tenderness, no bony tenderness, normal pulse and normal strength.  Neurological: She is alert and oriented to person, place, and time. She has normal reflexes. No cranial nerve deficit or sensory deficit.  Skin: Skin is warm and dry. No rash noted.  Psychiatric: She has a normal mood and affect. Her speech is normal and behavior is normal. Thought content normal.  Nursing note and vitals reviewed.   BP 130/64 (BP Location: Right Arm, Patient Position: Sitting, Cuff Size: Normal)   Pulse 73   Ht 5\' 9"  (1.753 m)   Wt 175 lb 9.6 oz (79.7 kg)   SpO2 100%   BMI 25.93 kg/m   Assessment and Plan: 1. Essential (primary) hypertension controlled  2. Controlled type 2 diabetes mellitus without complication, with long-term current use of insulin ([facility]) Continue current therapy - Hemoglobin A1c -  Microalbumin / creatinine urine ratio - POCT urinalysis dipstick  3. Hyperlipidemia associated with type 2 diabetes mellitus ([place]) On statins  4. 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   Halina Maidens, MD [facility]  03/13/2017

## 2017-03-13 NOTE — Patient Instructions (Signed)
Schedule Annual Eye exam

## 2017-03-14 LAB — HEMOGLOBIN A1C
ESTIMATED AVERAGE GLUCOSE: 174 mg/dL
Hgb A1c MFr Bld: 7.7 % — ABNORMAL HIGH (ref 4.8–5.6)

## 2017-03-17 ENCOUNTER — Other Ambulatory Visit: Payer: Self-pay | Admitting: Internal Medicine

## 2017-03-17 LAB — MICROALBUMIN / CREATININE URINE RATIO
Creatinine, Urine: 205.6 mg/dL
MICROALB/CREAT RATIO: 9.7 mg/g{creat} (ref 0.0–30.0)
Microalbumin, Urine: 20 ug/mL

## 2017-03-17 MED ORDER — INSULIN ASPART 100 UNIT/ML FLEXPEN: 10.0000 [IU] | PEN_INJECTOR | Freq: Every day | SUBCUTANEOUS | 11 refills | Status: DC

## 2017-03-17 NOTE — Progress Notes (Signed)
Spoke to pt about DM not improving. Wants insulin sent to [facility] in [place].

## 2017-03-17 NOTE — Progress Notes (Signed)
Pt informed

## 2017-04-01 ENCOUNTER — Telehealth: Payer: Self-pay

## 2017-04-01 NOTE — Telephone Encounter (Signed)
Pt informed

## 2017-04-01 NOTE — Telephone Encounter (Signed)
Pt called stating after starting new injection Fasting glucose in the morning is 121-128. Should she make any changes or start back on Glipizide?

## 2017-04-01 NOTE — Telephone Encounter (Signed)
Fasting blood sugar is pretty good, may be a little higher than normal. I will defer this to Dr. Army Melia. Wouldn't start back on glipizide with the insulin, would wait for Dr. Gaspar Cola advice.

## 2017-04-03 DIAGNOSIS — Z9289 Personal history of other medical treatment: Secondary | ICD-10-CM | POA: Diagnosis not present

## 2017-04-03 DIAGNOSIS — Z1231 Encounter for screening mammogram for malignant neoplasm of breast: Secondary | ICD-10-CM | POA: Diagnosis not present

## 2017-04-03 LAB — HM MAMMOGRAPHY

## 2017-04-04 DIAGNOSIS — H401132 Primary open-angle glaucoma, bilateral, moderate stage: Secondary | ICD-10-CM | POA: Diagnosis not present

## 2017-04-06 ENCOUNTER — Encounter: Payer: Self-pay | Admitting: Internal Medicine

## 2017-04-07 ENCOUNTER — Ambulatory Visit (INDEPENDENT_AMBULATORY_CARE_PROVIDER_SITE_OTHER): Payer: Medicare Other

## 2017-04-07 VITALS — BP 118/60 | HR 62 | Temp 97.7°F | Resp 16 | Ht 69.0 in | Wt 176.0 lb

## 2017-04-07 DIAGNOSIS — Z0000 Encounter for general adult medical examination without abnormal findings: Secondary | ICD-10-CM

## 2017-04-07 NOTE — Progress Notes (Signed)
Subjective:   Cathy Murphy is a 76 y.o. female who presents for Medicare Annual (Subsequent) preventive examination.  Review of Systems:   Cardiac Risk Factors include: advanced age (>60men, >34 women);diabetes mellitus;dyslipidemia;hypertension     Objective:     Vitals: BP 118/60 (BP Location: Right Arm, Patient Position: Sitting)   Pulse 62   Temp 97.7 F (36.5 C)   Resp 16   Ht 5\' 9"  (1.753 m)   Wt 176 lb (79.8 kg)   BMI 25.99 kg/m   Body mass index is 25.99 kg/m.   Tobacco History  Smoking Status  . Never Smoker  Smokeless Tobacco  . Never Used     Counseling given: Not Answered   Past Medical History:  Diagnosis Date  . Allergy   . Diabetes mellitus without complication ([place])   . Glaucoma   . Hyperlipidemia   . Hypertension    Past Surgical History:  Procedure Laterality Date  . ABDOMINAL HYSTERECTOMY     Family History  Problem Relation Age of Onset  . Cancer Mother        Breast  . Heart disease Father    History  Sexual Activity  . Sexual activity: No    Outpatient Encounter Prescriptions as of 04/07/2017  Medication Sig  . amLODipine (NORVASC) 5 MG tablet TAKE 1 TABLET BY MOUTH EVERY DAY  . atorvastatin (LIPITOR) 80 MG tablet TAKE 1 TABLET BY MOUTH EVERY DAY  . B-D UF III MINI PEN NEEDLES 31G X 5 MM MISC USE AS DIRECTED WITH LEVEMIR  . cholecalciferol (VITAMIN D) 1000 units tablet Take 2,000 Units by mouth daily.  Marland Kitchen gabapentin (NEURONTIN) 300 MG capsule Take 2 capsules (600 mg total) by mouth 3 (three) times daily.  Marland Kitchen glucose blood (BAYER CONTOUR TEST) test strip   . insulin aspart (NOVOLOG) 100 UNIT/ML FlexPen Inject 10 Units into the skin daily before supper.  Marland Kitchen JANUVIA 100 MG tablet TAKE 1 TABLET BY MOUTH EVERY DAY  . LEVEMIR FLEXTOUCH 100 UNIT/ML Pen INJECT 20 UNITS UNDER THE SKIN EVERY DAY  . lisinopril (PRINIVIL,ZESTRIL) 40 MG tablet TAKE 1 TABLET (40 MG TOTAL) BY MOUTH DAILY.  Marland Kitchen LUMIGAN 0.01 % SOLN TAKE 1 DROP(S) IN BOTH EYES  ONCE IN THE EVENING  . timolol (TIMOPTIC) 0.5 % ophthalmic solution Place 1 drop into both eyes.  . traMADol (ULTRAM) 50 MG tablet Take 1 tablet (50 mg total) by mouth every 6 (six) hours as needed.  . traZODone (DESYREL) 50 MG tablet TAKE 1 TABLET BY MOUTH AT BEDTIME  . pioglitazone (ACTOS) 15 MG tablet TAKE 1 TABLET BY MOUTH EVERY DAY (Patient not taking: Reported on 04/07/2017)  . [DISCONTINUED] glipiZIDE (GLUCOTROL) 5 MG tablet TAKE 1 TABLET BY MOUTH TWICE A DAY (Patient not taking: Reported on 04/07/2017)   No facility-administered encounter medications on file as of 04/07/2017.     Activities of Daily Living In your present state of health, do you have any difficulty performing the following activities: 04/07/2017  Hearing? N  Vision? N  Difficulty concentrating or making decisions? N  Walking or climbing stairs? N  Dressing or bathing? N  Doing errands, shopping? N  Preparing Food and eating ? N  Using the Toilet? N  In the past six months, have you accidently leaked urine? N  Do you have problems with loss of bowel control? N  Managing your Medications? N  Managing your Finances? N  Housekeeping or managing your Housekeeping? N  Some recent data might  be hidden    Patient Care Team: Glean Hess, MD as PCP - General (Family Medicine) [facility] (Ophthalmology)    Assessment:     Exercise Activities and Dietary recommendations Current Exercise Habits: The patient does not participate in regular exercise at present  Goals    . Increase water intake          Recommend drinking at least 3-4 glasses of water a day.      Fall Risk Fall Risk  04/07/2017 03/13/2017 03/06/2016 06/15/2015  Falls in the past year? No No No No   Depression Screen PHQ 2/9 Scores 04/07/2017 03/13/2017 03/06/2016 06/15/2015  PHQ - 2 Score 2 0 1 0  PHQ- 9 Score 2 - - -     Cognitive Function     6CIT Screen 04/07/2017  What Year? 0 points  What month? 0 points  What time? 0 points  Count back  from 20 0 points  Months in reverse 0 points  Repeat phrase 0 points  Total Score 0     There is no immunization history on file for this patient. Screening Tests Health Maintenance  Topic Date Due  . DEXA SCAN  11/04/2017 (Originally 10/28/2008)  . INFLUENZA VACCINE  11/13/2017 (Originally 06/04/2017)  . PNA vac Low Risk Adult (1 of 2 - PCV13) 11/13/2017 (Originally 10/28/2008)  . HEMOGLOBIN A1C  09/13/2017  . FOOT EXAM  03/13/2018  . OPHTHALMOLOGY EXAM  04/04/2018  . MAMMOGRAM  04/04/2019  . TETANUS/TDAP  03/04/2021  . COLONOSCOPY  04/02/2022      Plan:    I have personally reviewed and addressed the Medicare Annual Wellness questionnaire and have noted the following in the patient's chart:  A. Medical and social history B. Use of alcohol, tobacco or illicit drugs  C. Current medications and supplements D. Functional ability and status E.  Nutritional status F.  Physical activity G. Advance directives H. List of other physicians I.  Hospitalizations, surgeries, and ER visits in previous 12 months J.  [place] such as hearing and vision if needed, cognitive and depression L. Referrals and appointments - 07/16/2017-Dr Army Melia  In addition, I have reviewed and discussed with patient certain preventive protocols, quality metrics, and best practice recommendations. A written personalized care plan for preventive services as well as general preventive health recommendations were provided to patient.   Signed,  Tyler Aas, LPN Nurse Health Advisor   MD Recommendations: none

## 2017-04-07 NOTE — Patient Instructions (Addendum)
Cathy Murphy , Thank you for taking time to come for your Medicare Wellness Visit. I appreciate your ongoing commitment to your health goals. Please review the following plan we discussed and let me know if I can assist you in the future.   Screening recommendations/referrals: Colonoscopy: completed 11/04/2009 Mammogram: completed 03/13/2017 Bone Density: due- declined   Recommended yearly ophthalmology/optometry visit for glaucoma screening and checkup Recommended yearly dental visit for hygiene and checkup  Vaccinations: Influenza vaccine: up to date, due 06/2017 Pneumococcal vaccine: due now- declined  Tdap vaccine: done in 2013 Shingles vaccine:due now, check with your insurance company for coverage   Advanced directives: Advance directive discussed with you today. I have provided a copy for you to complete at home and have notarized. Once this is complete please bring a copy in to our office so we can scan it into your chart.  Conditions/risks identified: Recommend drinking at least 3-4 glasses of water a day.  Next appointment: Follow up with Dr. Army Melia on 07/16/2017 at 8:15am. Follow up in one year for your annual wellness exam.    Preventive Care 74 Years and Older, Female Preventive care refers to lifestyle choices and visits with your health care provider that can promote health and wellness. What does preventive care include?  A yearly physical exam. This is also called an annual well check.  Dental exams once or twice a year.  Routine eye exams. Ask your health care provider how often you should have your eyes checked.  Personal lifestyle choices, including:  Daily care of your teeth and gums.  Regular physical activity.  Eating a healthy diet.  Avoiding tobacco and drug use.  Limiting alcohol use.  Practicing safe sex.  Taking low-dose aspirin every day.  Taking vitamin and mineral supplements as recommended by your health care provider. What happens during an  annual well check? The services and screenings done by your health care provider during your annual well check will depend on your age, overall health, lifestyle risk factors, and family history of disease. Counseling  Your health care provider may ask you questions about your:  Alcohol use.  Tobacco use.  Drug use.  Emotional well-being.  Home and relationship well-being.  Sexual activity.  Eating habits.  History of falls.  Memory and ability to understand (cognition).  Work and work Statistician.  Reproductive health. Screening  You may have the following tests or measurements:  Height, weight, and BMI.  Blood pressure.  Lipid and cholesterol levels. These may be checked every 5 years, or more frequently if you are over 47 years old.  Skin check.  Lung cancer screening. You may have this screening every year starting at age 67 if you have a 30-pack-year history of smoking and currently smoke or have quit within the past 15 years.  Fecal occult blood test (FOBT) of the stool. You may have this test every year starting at age 4.  Flexible sigmoidoscopy or colonoscopy. You may have a sigmoidoscopy every 5 years or a colonoscopy every 10 years starting at age 58.  Hepatitis C blood test.  Hepatitis B blood test.  Sexually transmitted disease (STD) testing.  Diabetes screening. This is done by checking your blood sugar (glucose) after you have not eaten for a while (fasting). You may have this done every 1-3 years.  Bone density scan. This is done to screen for osteoporosis. You may have this done starting at age 66.  Mammogram. This may be done every 1-2 years. Talk to  your health care provider about how often you should have regular mammograms. Talk with your health care provider about your test results, treatment options, and if necessary, the need for more tests. Vaccines  Your health care provider may recommend certain vaccines, such as:  Influenza  vaccine. This is recommended every year.  Tetanus, diphtheria, and acellular pertussis (Tdap, Td) vaccine. You may need a Td booster every 10 years.  Zoster vaccine. You may need this after age 45.  Pneumococcal 13-valent conjugate (PCV13) vaccine. One dose is recommended after age 55.  Pneumococcal polysaccharide (PPSV23) vaccine. One dose is recommended after age 22. Talk to your health care provider about which screenings and vaccines you need and how often you need them. This information is not intended to replace advice given to you by your health care provider. Make sure you discuss any questions you have with your health care provider. Document Released: 11/17/2015 Document Revised: 07/10/2016 Document Reviewed: 08/22/2015 Elsevier Interactive Patient Education  2017 [place] Prevention in the Home Falls can cause injuries. They can happen to people of all ages. There are many things you can do to make your home safe and to help prevent falls. What can I do on the outside of my home?  Regularly fix the edges of walkways and driveways and fix any cracks.  Remove anything that might make you trip as you walk through a door, such as a raised step or threshold.  Trim any bushes or trees on the path to your home.  Use bright outdoor lighting.  Clear any walking paths of anything that might make someone trip, such as rocks or tools.  Regularly check to see if handrails are loose or broken. Make sure that both sides of any steps have handrails.  Any raised decks and porches should have guardrails on the edges.  Have any leaves, snow, or ice cleared regularly.  Use sand or salt on walking paths during winter.  Clean up any spills in your garage right away. This includes oil or grease spills. What can I do in the bathroom?  Use night lights.  Install grab bars by the toilet and in the tub and shower. Do not use towel bars as grab bars.  Use non-skid mats or decals  in the tub or shower.  If you need to sit down in the shower, use a plastic, non-slip stool.  Keep the floor dry. Clean up any water that spills on the floor as soon as it happens.  Remove soap buildup in the tub or shower regularly.  Attach bath mats securely with double-sided non-slip rug tape.  Do not have throw rugs and other things on the floor that can make you trip. What can I do in the bedroom?  Use night lights.  Make sure that you have a light by your bed that is easy to reach.  Do not use any sheets or blankets that are too big for your bed. They should not hang down onto the floor.  Have a firm chair that has side arms. You can use this for support while you get dressed.  Do not have throw rugs and other things on the floor that can make you trip. What can I do in the kitchen?  Clean up any spills right away.  Avoid walking on wet floors.  Keep items that you use a lot in easy-to-reach places.  If you need to reach something above you, use a strong step stool that has  a grab bar.  Keep electrical cords out of the way.  Do not use floor polish or wax that makes floors slippery. If you must use wax, use non-skid floor wax.  Do not have throw rugs and other things on the floor that can make you trip. What can I do with my stairs?  Do not leave any items on the stairs.  Make sure that there are handrails on both sides of the stairs and use them. Fix handrails that are broken or loose. Make sure that handrails are as long as the stairways.  Check any carpeting to make sure that it is firmly attached to the stairs. Fix any carpet that is loose or worn.  Avoid having throw rugs at the top or bottom of the stairs. If you do have throw rugs, attach them to the floor with carpet tape.  Make sure that you have a light switch at the top of the stairs and the bottom of the stairs. If you do not have them, ask someone to add them for you. What else can I do to help  prevent falls?  Wear shoes that:  Do not have high heels.  Have rubber bottoms.  Are comfortable and fit you well.  Are closed at the toe. Do not wear sandals.  If you use a stepladder:  Make sure that it is fully opened. Do not climb a closed stepladder.  Make sure that both sides of the stepladder are locked into place.  Ask someone to hold it for you, if possible.  Clearly mark and make sure that you can see:  Any grab bars or handrails.  First and last steps.  Where the edge of each step is.  Use tools that help you move around (mobility aids) if they are needed. These include:  Canes.  Walkers.  Scooters.  Crutches.  Turn on the lights when you go into a dark area. Replace any light bulbs as soon as they burn out.  Set up your furniture so you have a clear path. Avoid moving your furniture around.  If any of your floors are uneven, fix them.  If there are any pets around you, be aware of where they are.  Review your medicines with your doctor. Some medicines can make you feel dizzy. This can increase your chance of falling. Ask your doctor what other things that you can do to help prevent falls. This information is not intended to replace advice given to you by your health care provider. Make sure you discuss any questions you have with your health care provider. Document Released: 08/17/2009 Document Revised: 03/28/2016 Document Reviewed: 11/25/2014 Elsevier Interactive Patient Education  2017 [employer].

## 2017-05-03 ENCOUNTER — Other Ambulatory Visit: Payer: Self-pay | Admitting: Internal Medicine

## 2017-05-12 ENCOUNTER — Telehealth: Payer: Self-pay

## 2017-05-12 NOTE — Telephone Encounter (Signed)
Make sure she is taking the Levemir insulin in the morning and the Novolog before supper every day.  Continue to check BS and if still elevated by Wednesday, call for instructions to increase the insulin dose.

## 2017-05-12 NOTE — Telephone Encounter (Signed)
Patient calling stating her BS is high and she is unsure of what to do- States yesterday it was 296 fasting and this morning was 200. Pt was taken off glipizide and wonders if this is the reason- also would like contour blood test strips sent in. Please advise.

## 2017-05-12 NOTE — Telephone Encounter (Signed)
Informed pt to call Wednesday if still increased BS for new insulin dose.

## 2017-05-14 ENCOUNTER — Other Ambulatory Visit: Payer: Self-pay

## 2017-05-14 MED ORDER — GLUCOSE BLOOD VI STRP: ORAL_STRIP | 12 refills | Status: DC

## 2017-05-23 ENCOUNTER — Telehealth: Payer: Self-pay

## 2017-05-23 DIAGNOSIS — H401132 Primary open-angle glaucoma, bilateral, moderate stage: Secondary | ICD-10-CM | POA: Diagnosis not present

## 2017-05-23 LAB — HM DIABETES EYE EXAM

## 2017-05-23 NOTE — Telephone Encounter (Signed)
Pt called and left Vm stating BS is elevated and would like a callback by the end of the day. CMA Jamie called at 3:02PM and pt did not answer. I recalled the patient just now and left VM for her to call the office back. Awaiting pt call.

## 2017-06-02 ENCOUNTER — Encounter: Payer: Self-pay | Admitting: Family Medicine

## 2017-06-02 ENCOUNTER — Ambulatory Visit (INDEPENDENT_AMBULATORY_CARE_PROVIDER_SITE_OTHER): Payer: Medicare Other | Admitting: Family Medicine

## 2017-06-02 ENCOUNTER — Other Ambulatory Visit: Payer: Self-pay | Admitting: Internal Medicine

## 2017-06-02 VITALS — BP 130/62 | HR 80 | Ht 69.0 in | Wt 174.0 lb

## 2017-06-02 DIAGNOSIS — E119 Type 2 diabetes mellitus without complications: Secondary | ICD-10-CM

## 2017-06-02 DIAGNOSIS — Z794 Long term (current) use of insulin: Secondary | ICD-10-CM | POA: Diagnosis not present

## 2017-06-02 NOTE — Patient Instructions (Signed)

## 2017-06-02 NOTE — Progress Notes (Signed)
Name: Cathy Murphy   MRN: [id number]    DOB: Aug 06, 1941   Date:06/02/2017       Progress Note  Subjective  Chief Complaint  Chief Complaint  Patient presents with  . Follow-up    diabetes- A1C was 7.7 in May- told to stop Glipizide and take humalog Kwik pen- Bs has been elevated    Diabetes  She presents for her follow-up diabetic visit. She has type 2 diabetes mellitus. There are no hypoglycemic associated symptoms. Pertinent negatives for hypoglycemia include no dizziness, headaches or nervousness/anxiousness. Associated symptoms include polyuria. Pertinent negatives for diabetes include no blurred vision, no chest pain, no foot paresthesias, no foot ulcerations, no polydipsia, no polyphagia, no visual change, no weakness and no weight loss. (Nocturia) There are no hypoglycemic complications. Symptoms are worsening. There are no diabetic complications. Current diabetic treatment includes insulin injections and oral agent (dual therapy). Her weight is stable. Diabetic current diet: watermelon. Her breakfast blood glucose is taken between 7-8 am. Her breakfast blood glucose range is generally >200 mg/dl. An ACE inhibitor/angiotensin II receptor blocker is being taken.    No problem-specific Assessment & Plan notes found for this encounter.   Past Medical History:  Diagnosis Date  . Allergy   . Diabetes mellitus without complication ([place])   . Glaucoma   . Hyperlipidemia   . Hypertension     Past Surgical History:  Procedure Laterality Date  . ABDOMINAL HYSTERECTOMY      Family History  Problem Relation Age of Onset  . Cancer Mother        Breast  . Heart disease Father     Social History   Social History  . Marital status: Widowed    Spouse name: N/A  . Number of children: N/A  . Years of education: N/A   Occupational History  . Not on file.   Social History Main Topics  . Smoking status: Never Smoker  . Smokeless tobacco: Never Used  . Alcohol use No  . Drug  use: No  . Sexual activity: No   Other Topics Concern  . Not on file   Social History Narrative  . No narrative on file    Allergies  Allergen Reactions  . Amoxicillin Diarrhea  . Aspirin Nausea And Vomiting  . Metformin And Related Diarrhea  . Oxycodone Other (See Comments)    CNS side effects/agitation  . Sulfa Antibiotics     Outpatient Medications Prior to Visit  Medication Sig Dispense Refill  . amLODipine (NORVASC) 5 MG tablet TAKE 1 TABLET BY MOUTH EVERY DAY 30 tablet 10  . atorvastatin (LIPITOR) 80 MG tablet TAKE 1 TABLET BY MOUTH EVERY DAY 30 tablet 11  . B-D UF III MINI PEN NEEDLES 31G X 5 MM MISC USE AS DIRECTED WITH LEVEMIR 100 each 3  . cholecalciferol (VITAMIN D) 1000 units tablet Take 2,000 Units by mouth daily.    Marland Kitchen glucose blood (BAYER CONTOUR TEST) test strip Test once daily. 100 each 12  . insulin aspart (NOVOLOG) 100 UNIT/ML FlexPen Inject 10 Units into the skin daily before supper. 15 mL 11  . JANUVIA 100 MG tablet TAKE 1 TABLET BY MOUTH EVERY DAY 30 tablet 11  . LEVEMIR FLEXTOUCH 100 UNIT/ML Pen INJECT 20 UNITS UNDER THE SKIN EVERY DAY 15 pen 3  . lisinopril (PRINIVIL,ZESTRIL) 40 MG tablet TAKE 1 TABLET (40 MG TOTAL) BY MOUTH DAILY. 30 tablet 10  . LUMIGAN 0.01 % SOLN TAKE 1 DROP(S) IN BOTH EYES ONCE  IN THE EVENING  5  . pioglitazone (ACTOS) 15 MG tablet TAKE 1 TABLET BY MOUTH EVERY DAY 30 tablet 11  . timolol (TIMOPTIC) 0.5 % ophthalmic solution Place 1 drop into both eyes.  5  . traZODone (DESYREL) 50 MG tablet TAKE 1 TABLET BY MOUTH AT BEDTIME 30 tablet 7  . gabapentin (NEURONTIN) 300 MG capsule Take 2 capsules (600 mg total) by mouth 3 (three) times daily. 90 capsule 0  . traMADol (ULTRAM) 50 MG tablet Take 1 tablet (50 mg total) by mouth every 6 (six) hours as needed. 15 tablet 0  . pioglitazone (ACTOS) 15 MG tablet TAKE 1 TABLET BY MOUTH EVERY DAY 30 tablet 11   No facility-administered medications prior to visit.     Review of Systems   Constitutional: Negative for chills, fever, malaise/fatigue and weight loss.  HENT: Negative for ear discharge, ear pain and sore throat.   Eyes: Negative for blurred vision.  Respiratory: Negative for cough, sputum production, shortness of breath and wheezing.   Cardiovascular: Negative for chest pain, palpitations and leg swelling.  Gastrointestinal: Negative for abdominal pain, blood in stool, constipation, diarrhea, heartburn, melena and nausea.  Genitourinary: Negative for dysuria, frequency, hematuria and urgency.  Musculoskeletal: Negative for back pain, joint pain, myalgias and neck pain.  Skin: Negative for rash.  Neurological: Negative for dizziness, tingling, sensory change, focal weakness, weakness and headaches.  Endo/Heme/Allergies: Negative for environmental allergies, polydipsia and polyphagia. Does not bruise/bleed easily.  Psychiatric/Behavioral: Negative for depression and suicidal ideas. The patient is not nervous/anxious and does not have insomnia.      Objective  Vitals:   06/02/17 1410  BP: 130/62  Pulse: 80  Weight: 174 lb (78.9 kg)  Height: 5\' 9"  (1.753 m)    Physical Exam  Constitutional: She is well-developed, well-nourished, and in no distress. No distress.  HENT:  Head: Normocephalic and atraumatic.  Right Ear: External ear normal.  Left Ear: External ear normal.  Nose: Nose normal.  Mouth/Throat: Oropharynx is clear and moist.  Eyes: Pupils are equal, round, and reactive to light. Conjunctivae and EOM are normal. Right eye exhibits no discharge. Left eye exhibits no discharge.  Neck: Normal range of motion. Neck supple. No JVD present. No thyromegaly present.  Cardiovascular: Normal rate, regular rhythm, normal heart sounds and intact distal pulses.  Exam reveals no gallop and no friction rub.   No murmur heard. Pulmonary/Chest: Effort normal and breath sounds normal. She has no wheezes. She has no rales.  Abdominal: Soft. Bowel sounds are normal.  She exhibits no mass. There is no tenderness. There is no guarding.  Musculoskeletal: Normal range of motion. She exhibits no edema.  Lymphadenopathy:    She has no cervical adenopathy.  Neurological: She is alert. She has normal reflexes.  Skin: Skin is warm and dry. She is not diaphoretic.  Psychiatric: Mood and affect normal.  Nursing note and vitals reviewed.     Assessment & Plan  Problem List Items Addressed This Visit      Endocrine   Controlled type 2 diabetes mellitus without complication, with long-term current use of insulin ([place]) - Primary      No orders of the defined types were placed in this encounter.     Dr. Mac[facility]  06/02/17

## 2017-06-11 DIAGNOSIS — Z86010 Personal history of colonic polyps: Secondary | ICD-10-CM | POA: Diagnosis not present

## 2017-07-16 ENCOUNTER — Ambulatory Visit (INDEPENDENT_AMBULATORY_CARE_PROVIDER_SITE_OTHER): Payer: Medicare Other | Admitting: Internal Medicine

## 2017-07-16 ENCOUNTER — Encounter: Payer: Self-pay | Admitting: Internal Medicine

## 2017-07-16 VITALS — BP 134/72 | HR 74 | Ht 69.0 in | Wt 173.0 lb

## 2017-07-16 DIAGNOSIS — I10 Essential (primary) hypertension: Secondary | ICD-10-CM | POA: Diagnosis not present

## 2017-07-16 DIAGNOSIS — E119 Type 2 diabetes mellitus without complications: Secondary | ICD-10-CM | POA: Diagnosis not present

## 2017-07-16 DIAGNOSIS — Z794 Long term (current) use of insulin: Secondary | ICD-10-CM | POA: Diagnosis not present

## 2017-07-16 NOTE — Progress Notes (Signed)
Date:  07/16/2017   Name:  Cathy Murphy   DOB:  [birth year]   MRN:  [id number]   Chief Complaint: Diabetes (increased  levemir to 24 units)  Diabetes  She presents for her follow-up diabetic visit. She has type 2 diabetes mellitus. Her disease course has been improving. There are no hypoglycemic associated symptoms. Pertinent negatives for hypoglycemia include no headaches or tremors. Pertinent negatives for diabetes include no chest pain, no fatigue, no polydipsia and no polyuria. Symptoms are improving. Current diabetic treatment includes insulin injections and oral agent (monotherapy). She is compliant with treatment all of the time. Her weight is stable. She is following a generally healthy diet. Her home blood glucose trend is decreasing steadily. Her breakfast blood glucose is taken between 6-7 am. Her breakfast blood glucose range is generally 110-130 mg/dl. An ACE inhibitor/angiotensin II receptor blocker is being taken.  Hypertension  This is a chronic problem. The problem is controlled. Pertinent negatives include no chest pain, headaches, palpitations or shortness of breath. Past treatments include ACE inhibitors. The current treatment provides significant improvement.     Review of Systems  Constitutional: Negative for appetite change, fatigue, fever and unexpected weight change.  HENT: Negative for tinnitus and trouble swallowing.   Eyes: Negative for visual disturbance.  Respiratory: Negative for cough, chest tightness and shortness of breath.   Cardiovascular: Negative for chest pain, palpitations and leg swelling.  Gastrointestinal: Negative for abdominal pain.  Endocrine: Negative for polydipsia and polyuria.  Genitourinary: Negative for dysuria and hematuria.  Musculoskeletal: Negative for arthralgias.  Neurological: Negative for tremors, numbness and headaches.  Psychiatric/Behavioral: Negative for dysphoric mood.    Patient Active Problem List   Diagnosis Date  Noted  . 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 Ventral hernia without obstruction or gangrene 03/06/2016  . Controlled type 2 diabetes mellitus without complication, with long-term current use of insulin ([place]) 11/09/2015  . Plantar fasciitis, right 06/16/2015  . Knee torn cartilage 04/26/2015  . Allergic rhinitis, seasonal 04/26/2015  . Essential (primary) hypertension 04/26/2015  . Glaucoma 04/26/2015  . Hyperlipidemia associated with type 2 diabetes mellitus ([place]) 04/26/2015  . Insomnia 04/26/2015    Prior to Admission medications   Medication Sig Start Date End Date Taking? Authorizing Provider  amLODipine (NORVASC) 5 MG tablet TAKE 1 TABLET BY MOUTH EVERY DAY 02/01/17  Yes Glean Hess, MD  atorvastatin (LIPITOR) 80 MG tablet TAKE 1 TABLET BY MOUTH EVERY DAY 12/17/16  Yes Glean Hess, MD  B-D UF III MINI PEN NEEDLES 31G X 5 MM MISC USE AS DIRECTED WITH LEVEMIR 06/03/17  Yes Glean Hess, MD  cholecalciferol (VITAMIN D) 1000 units tablet Take 2,000 Units by mouth daily.   Yes [provider]  glucose blood (BAYER CONTOUR TEST) test strip Test once daily. 05/14/17  Yes Glean Hess, MD  insulin aspart (NOVOLOG) 100 UNIT/ML FlexPen Inject 10 Units into the skin daily before supper. 03/17/17  Yes Glean Hess, MD  JANUVIA 100 MG tablet TAKE 1 TABLET BY MOUTH EVERY DAY 03/02/17  Yes Glean Hess, MD  LEVEMIR FLEXTOUCH 100 UNIT/ML Pen INJECT 20 UNITS UNDER THE SKIN EVERY DAY Patient taking differently: INJECT 24 UNITS UNDER THE SKIN EVERY DAY 01/01/17  Yes Glean Hess, MD  lisinopril (PRINIVIL,ZESTRIL) 40 MG tablet TAKE 1 TABLET (40 MG TOTAL) BY MOUTH DAILY. 12/01/16  Yes Glean Hess, MD  LUMIGAN 0.01 % SOLN TAKE 1 DROP(S) IN BOTH EYES ONCE IN THE  EVENING 03/01/15  Yes [provider]  pioglitazone (ACTOS) 15 MG tablet TAKE 1 TABLET BY MOUTH EVERY DAY 03/02/17  Yes Glean Hess, MD  timolol (TIMOPTIC) 0.5 % ophthalmic solution Place 1  drop into both eyes. 02/15/16  Yes [provider]  traZODone (DESYREL) 50 MG tablet TAKE 1 TABLET BY MOUTH AT BEDTIME 03/01/17  Yes Glean Hess, MD    Allergies  Allergen Reactions  . Amoxicillin Diarrhea  . Aspirin Nausea And Vomiting  . Metformin And Related Diarrhea  . Oxycodone Other (See Comments)    CNS side effects/agitation  . Sulfa Antibiotics     Past Surgical History:  Procedure Laterality Date  . ABDOMINAL HYSTERECTOMY      Social History  Substance Use Topics  . Smoking status: Never Smoker  . Smokeless tobacco: Never Used  . Alcohol use No     Medication list has been reviewed and updated.  PHQ 2/9 Scores 04/07/2017 03/13/2017 03/06/2016 06/15/2015  PHQ - 2 Score 2 0 1 0  PHQ- 9 Score 2 - - -    Physical Exam  Constitutional: She is oriented to person, place, and time. She appears well-developed. No distress.  HENT:  Head: Normocephalic and atraumatic.  Cardiovascular: Normal rate, regular rhythm and normal heart sounds.   Pulmonary/Chest: Effort normal and breath sounds normal. No respiratory distress. She has no wheezes.  Abdominal: Soft. Bowel sounds are normal.  Musculoskeletal: Normal range of motion. She exhibits no edema or tenderness.  Neurological: She is alert and oriented to person, place, and time.  Skin: Skin is warm and dry. No rash noted.  Psychiatric: She has a normal mood and affect. Her behavior is normal. Thought content normal.  Nursing note and vitals reviewed.   BP 134/72   Pulse 74   Ht 5\' 9"  (1.753 m)   Wt 173 lb (78.5 kg)   SpO2 100%   BMI 25.55 kg/m   Assessment and Plan: 1. Controlled type 2 diabetes mellitus without complication, with long-term current use of insulin ([facility]) Improving - continue Levemir and Novolog Consider change to [place] or Xultophy for single injection per day treatment - Basic metabolic panel - Hemoglobin A1c  2. Essential (primary) hypertension controlled   No orders of the  defined types were placed in this encounter.   Partially dictated using Editor, commissioning. Any errors are unintentional.  Halina Maidens, MD [facility]  07/16/2017

## 2017-07-17 LAB — BASIC METABOLIC PANEL
BUN / CREAT RATIO: 16 (ref 12–28)
BUN: 16 mg/dL (ref 8–27)
CO2: 26 mmol/L (ref 20–29)
CREATININE: 0.98 mg/dL (ref 0.57–1.00)
Calcium: 9.3 mg/dL (ref 8.7–10.3)
Chloride: 101 mmol/L (ref 96–106)
GFR calc Af Amer: 66 mL/min/{1.73_m2} (ref >59)
GFR calc non Af Amer: 57 mL/min/{1.73_m2} — ABNORMAL LOW (ref >59)
Glucose: 108 mg/dL — ABNORMAL HIGH (ref 65–99)
Potassium: 4.2 mmol/L (ref 3.5–5.2)
SODIUM: 143 mmol/L (ref 134–144)

## 2017-07-17 LAB — HEMOGLOBIN A1C
Est. average glucose Bld gHb Est-mCnc: 255 mg/dL
Hgb A1c MFr Bld: 10.5 % — ABNORMAL HIGH (ref 4.8–5.6)

## 2017-07-23 ENCOUNTER — Telehealth: Payer: Self-pay

## 2017-07-23 NOTE — Telephone Encounter (Signed)
Patient informed of 10.5 A1C- needs to begin glimepiride 5 mg every AM and will recheck at next follow up.

## 2017-08-28 ENCOUNTER — Encounter: Payer: Self-pay | Admitting: Internal Medicine

## 2017-08-28 ENCOUNTER — Ambulatory Visit (INDEPENDENT_AMBULATORY_CARE_PROVIDER_SITE_OTHER): Payer: Medicare Other | Admitting: Internal Medicine

## 2017-08-28 VITALS — BP 122/58 | HR 74 | Temp 98.4°F | Ht 69.0 in | Wt 171.0 lb

## 2017-08-28 DIAGNOSIS — J019 Acute sinusitis, unspecified: Secondary | ICD-10-CM | POA: Diagnosis not present

## 2017-08-28 DIAGNOSIS — J302 Other seasonal allergic rhinitis: Secondary | ICD-10-CM

## 2017-08-28 MED ORDER — FLUTICASONE PROPIONATE 50 MCG/ACT NA SUSP: 2.0000 | Freq: Every day | NASAL | 6 refills | Status: DC

## 2017-08-28 MED ORDER — AZITHROMYCIN 250 MG PO TABS: ORAL_TABLET | ORAL | 0 refills | Status: DC

## 2017-08-28 NOTE — Patient Instructions (Signed)
Get Saline Nasal spray to use as needed for nasal irritation

## 2017-08-28 NOTE — Progress Notes (Signed)
Date:  08/28/2017   Name:  Cathy Murphy   DOB:  31-Aug-1941   MRN:  [id number]   Chief Complaint: Sinusitis (Over a week. Stuffy face- no tempature. Sneezing and headache. Chest sore from sneezing so much. )  Sinusitis  This is a new problem. The current episode started in the past 7 days. There has been no fever. The pain is mild. Associated symptoms include ear pain (fullness), headaches and sinus pressure. Pertinent negatives include no shortness of breath.     Review of Systems  HENT: Positive for ear pain (fullness) and sinus pressure.   Respiratory: Negative for chest tightness, shortness of breath and wheezing.   Cardiovascular: Negative for chest pain.  Neurological: Positive for headaches.    Patient Active Problem List   Diagnosis Date Noted  . 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 Ventral hernia without obstruction or gangrene 03/06/2016  . Controlled type 2 diabetes mellitus without complication, with long-term current use of insulin ([place]) 11/09/2015  . Plantar fasciitis, right 06/16/2015  . Knee torn cartilage 04/26/2015  . Allergic rhinitis, seasonal 04/26/2015  . Essential (primary) hypertension 04/26/2015  . Glaucoma 04/26/2015  . Hyperlipidemia associated with type 2 diabetes mellitus ([place]) 04/26/2015  . Insomnia 04/26/2015    Prior to Admission medications   Medication Sig Start Date End Date Taking? Authorizing Provider  amLODipine (NORVASC) 5 MG tablet TAKE 1 TABLET BY MOUTH EVERY DAY 02/01/17  Yes Glean Hess, MD  atorvastatin (LIPITOR) 80 MG tablet TAKE 1 TABLET BY MOUTH EVERY DAY 12/17/16  Yes Glean Hess, MD  B-D UF III MINI PEN NEEDLES 31G X 5 MM MISC USE AS DIRECTED WITH LEVEMIR 06/03/17  Yes Glean Hess, MD  cholecalciferol (VITAMIN D) 1000 units tablet Take 2,000 Units by mouth daily.   Yes [provider]  glucose blood (BAYER CONTOUR TEST) test strip Test once daily. 05/14/17  Yes Glean Hess, MD  insulin aspart  (NOVOLOG) 100 UNIT/ML FlexPen Inject 10 Units into the skin daily before supper. 03/17/17  Yes Glean Hess, MD  JANUVIA 100 MG tablet TAKE 1 TABLET BY MOUTH EVERY DAY 03/02/17  Yes Glean Hess, MD  LEVEMIR FLEXTOUCH 100 UNIT/ML Pen INJECT 20 UNITS UNDER THE SKIN EVERY DAY Patient taking differently: INJECT 24 UNITS UNDER THE SKIN EVERY DAY 01/01/17  Yes Glean Hess, MD  lisinopril (PRINIVIL,ZESTRIL) 40 MG tablet TAKE 1 TABLET (40 MG TOTAL) BY MOUTH DAILY. 12/01/16  Yes Glean Hess, MD  LUMIGAN 0.01 % SOLN TAKE 1 DROP(S) IN BOTH EYES ONCE IN THE EVENING 03/01/15  Yes [provider]  pioglitazone (ACTOS) 15 MG tablet TAKE 1 TABLET BY MOUTH EVERY DAY 03/02/17  Yes Glean Hess, MD  timolol (TIMOPTIC) 0.5 % ophthalmic solution Place 1 drop into both eyes. 02/15/16  Yes [provider]  traZODone (DESYREL) 50 MG tablet TAKE 1 TABLET BY MOUTH AT BEDTIME 03/01/17  Yes Glean Hess, MD    Allergies  Allergen Reactions  . Amoxicillin Diarrhea  . Aspirin Nausea And Vomiting  . Metformin And Related Diarrhea  . Oxycodone Other (See Comments)    CNS side effects/agitation  . Sulfa Antibiotics     Past Surgical History:  Procedure Laterality Date  . ABDOMINAL HYSTERECTOMY      Social History  Substance Use Topics  . Smoking status: Never Smoker  . Smokeless tobacco: Never Used  . Alcohol use No     Medication list has  been reviewed and updated.  PHQ 2/9 Scores 04/07/2017 03/13/2017 03/06/2016 06/15/2015  PHQ - 2 Score 2 0 1 0  PHQ- 9 Score 2 - - -    Physical Exam  Constitutional: She is oriented to person, place, and time. She appears well-developed and well-nourished.  HENT:  Right Ear: External ear normal.  Left Ear: External ear normal.  Nose: Right sinus exhibits maxillary sinus tenderness. Right sinus exhibits no frontal sinus tenderness. Left sinus exhibits maxillary sinus tenderness. Left sinus exhibits no frontal sinus tenderness.    Mouth/Throat: Uvula is midline and mucous membranes are normal. No oral lesions. No oropharyngeal exudate or posterior oropharyngeal erythema.  Cerumen bilaterally  Cardiovascular: Normal rate, regular rhythm and normal heart sounds.   Pulmonary/Chest: Breath sounds normal. She has no wheezes. She has no rales.  Lymphadenopathy:    She has no cervical adenopathy.  Neurological: She is alert and oriented to person, place, and time.    BP (!) 122/58   Pulse 74   Temp 98.4 F (36.9 C) (Oral)   Ht 5\' 9"  (1.753 m)   Wt 171 lb (77.6 kg)   SpO2 100%   BMI 25.25 kg/m   Assessment and Plan: 1. Acute sinusitis, recurrence not specified, unspecified location - azithromycin (ZITHROMAX Z-PAK) 250 MG tablet; UAD  Dispense: 6 each; Refill: 0  2. Seasonal allergic rhinitis, unspecified trigger - fluticasone (FLONASE) 50 MCG/ACT nasal spray; Place 2 sprays into both nostrils daily.  Dispense: 16 g; Refill: 6   Meds ordered this encounter  Medications  . azithromycin (ZITHROMAX Z-PAK) 250 MG tablet    Sig: UAD    Dispense:  6 each    Refill:  0  . fluticasone (FLONASE) 50 MCG/ACT nasal spray    Sig: Place 2 sprays into both nostrils daily.    Dispense:  16 g    Refill:  6    Partially dictated using Editor, commissioning. Any errors are unintentional.  Halina Maidens, MD [facility]  08/28/2017

## 2017-09-06 ENCOUNTER — Other Ambulatory Visit: Payer: Self-pay | Admitting: Internal Medicine

## 2017-10-31 ENCOUNTER — Encounter: Payer: Self-pay | Admitting: Internal Medicine

## 2017-10-31 ENCOUNTER — Ambulatory Visit (INDEPENDENT_AMBULATORY_CARE_PROVIDER_SITE_OTHER): Payer: Medicare Other | Admitting: Internal Medicine

## 2017-10-31 VITALS — BP 118/68 | HR 60 | Ht 69.0 in | Wt 171.0 lb

## 2017-10-31 DIAGNOSIS — Z794 Long term (current) use of insulin: Secondary | ICD-10-CM

## 2017-10-31 DIAGNOSIS — E119 Type 2 diabetes mellitus without complications: Secondary | ICD-10-CM

## 2017-10-31 DIAGNOSIS — I10 Essential (primary) hypertension: Secondary | ICD-10-CM | POA: Diagnosis not present

## 2017-10-31 NOTE — Progress Notes (Signed)
Date:  10/31/2017   Name:  Cathy Murphy   DOB:  01/11/1941   MRN:  [id number]   Chief Complaint: Diabetes and Hypertension Now back on glipizide but has stopped pre-supper novolog.   Diabetes  She presents for her follow-up diabetic visit. She has type 2 diabetes mellitus. Her disease course has been improving. Pertinent negatives for hypoglycemia include no headaches or tremors. Pertinent negatives for diabetes include no chest pain, no fatigue, no polydipsia and no polyuria. Symptoms are stable. Current diabetic treatment includes insulin injections and oral agent (triple therapy) (januvia, actos, glipizide). She is compliant with treatment most of the time. She monitors blood glucose at home 1-2 x per day. Her home blood glucose trend is decreasing steadily. Her breakfast blood glucose is taken between 7-8 am. Her breakfast blood glucose range is generally 110-130 mg/dl. An ACE inhibitor/angiotensin II receptor blocker is being taken. Eye exam is current.  Hypertension  This is a chronic problem. The problem is controlled. Pertinent negatives include no chest pain, headaches, palpitations or shortness of breath. Past treatments include ACE inhibitors.    Lab Results  Component Value Date   HGBA1C 10.5 (H) 07/16/2017     Review of Systems  Constitutional: Negative for appetite change, fatigue, fever and unexpected weight change.  HENT: Negative for tinnitus and trouble swallowing.   Eyes: Negative for visual disturbance.  Respiratory: Negative for cough, chest tightness and shortness of breath.   Cardiovascular: Negative for chest pain, palpitations and leg swelling.  Gastrointestinal: Negative for abdominal pain.  Endocrine: Negative for polydipsia and polyuria.  Genitourinary: Negative for dysuria and hematuria.  Musculoskeletal: Negative for arthralgias.  Neurological: Negative for tremors, numbness and headaches.  Psychiatric/Behavioral: Negative for dysphoric mood.     Patient Active Problem List   Diagnosis Date Noted  . 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 Ventral hernia without obstruction or gangrene 03/06/2016  . Controlled type 2 diabetes mellitus without complication, with long-term current use of insulin ([place]) 11/09/2015  . Plantar fasciitis, right 06/16/2015  . Knee torn cartilage 04/26/2015  . Allergic rhinitis, seasonal 04/26/2015  . Essential (primary) hypertension 04/26/2015  . Glaucoma 04/26/2015  . Hyperlipidemia associated with type 2 diabetes mellitus ([place]) 04/26/2015  . Insomnia 04/26/2015    Prior to Admission medications   Medication Sig Start Date End Date Taking? Authorizing Provider  amLODipine (NORVASC) 5 MG tablet TAKE 1 TABLET BY MOUTH EVERY DAY 02/01/17   Glean Hess, MD  atorvastatin (LIPITOR) 80 MG tablet TAKE 1 TABLET BY MOUTH EVERY DAY 12/17/16   Glean Hess, MD       Glean Hess, MD  B-D UF III MINI PEN NEEDLES 31G X 5 MM MISC USE AS DIRECTED WITH LEVEMIR 06/03/17   Glean Hess, MD  cholecalciferol (VITAMIN D) 1000 units tablet Take 2,000 Units by mouth daily.    [provider]  fluticasone (FLONASE) 50 MCG/ACT nasal spray Place 2 sprays into both nostrils daily. 08/28/17   Glean Hess, MD  glucose blood (BAYER CONTOUR TEST) test strip Test once daily. 05/14/17   Glean Hess, MD  insulin aspart (NOVOLOG) 100 UNIT/ML FlexPen Inject 10 Units into the skin daily before supper. 03/17/17   Glean Hess, MD  JANUVIA 100 MG tablet TAKE 1 TABLET BY MOUTH EVERY DAY 03/02/17   Glean Hess, MD  LEVEMIR FLEXTOUCH 100 UNIT/ML Pen INJECT 20 UNITS UNDER THE SKIN EVERY DAY 09/06/17   Glean Hess, MD  lisinopril (PRINIVIL,ZESTRIL) 40 MG tablet TAKE 1 TABLET (40 MG TOTAL) BY MOUTH DAILY. 12/01/16   Glean Hess, MD  LUMIGAN 0.01 % SOLN TAKE 1 DROP(S) IN BOTH EYES ONCE IN THE EVENING 03/01/15   [provider]  pioglitazone (ACTOS) 15 MG tablet TAKE 1 TABLET BY MOUTH  EVERY DAY 03/02/17   Glean Hess, MD  timolol (TIMOPTIC) 0.5 % ophthalmic solution Place 1 drop into both eyes. 02/15/16   [provider]  traZODone (DESYREL) 50 MG tablet TAKE 1 TABLET BY MOUTH AT BEDTIME 03/01/17   Glean Hess, MD    Allergies  Allergen Reactions  . Amoxicillin Diarrhea  . Aspirin Nausea And Vomiting  . Metformin And Related Diarrhea  . Oxycodone Other (See Comments)    CNS side effects/agitation  . Sulfa Antibiotics     Past Surgical History:  Procedure Laterality Date  . ABDOMINAL HYSTERECTOMY    . LIPOMA RESECTION     right posterior neck    Social History   Tobacco Use  . Smoking status: Never Smoker  . Smokeless tobacco: Never Used  Substance Use Topics  . Alcohol use: No  . Drug use: No     Medication list has been reviewed and updated.  PHQ 2/9 Scores 04/07/2017 03/13/2017 03/06/2016 06/15/2015  PHQ - 2 Score 2 0 1 0  PHQ- 9 Score 2 - - -    Physical Exam  Constitutional: She is oriented to person, place, and time. She appears well-developed. No distress.  HENT:  Head: Normocephalic and atraumatic.  Pulmonary/Chest: Effort normal. No respiratory distress.  Musculoskeletal: Normal range of motion.  Neurological: She is alert and oriented to person, place, and time.  Skin: Skin is warm and dry. No rash noted.  Psychiatric: She has a normal mood and affect. Her behavior is normal. Thought content normal.  Nursing note and vitals reviewed.   BP 118/68   Pulse 60   Ht 5\' 9"  (1.753 m)   Wt 171 lb (77.6 kg)   BMI 25.25 kg/m   Assessment and Plan: 1. Controlled type 2 diabetes mellitus without complication, with long-term current use of insulin ([place]) Should be improved No longer on Novolog - Hemoglobin A1c  2. Essential (primary) hypertension controlled   No orders of the defined types were placed in this encounter.   Partially dictated using Editor, commissioning. Any errors are unintentional.  Halina Maidens,  MD [facility]  10/31/2017

## 2017-11-01 ENCOUNTER — Other Ambulatory Visit: Payer: Self-pay | Admitting: Internal Medicine

## 2017-11-01 DIAGNOSIS — I10 Essential (primary) hypertension: Secondary | ICD-10-CM

## 2017-11-01 LAB — HEMOGLOBIN A1C
Est. average glucose Bld gHb Est-mCnc: 163 mg/dL
HEMOGLOBIN A1C: 7.3 % — AB (ref 4.8–5.6)

## 2017-11-21 ENCOUNTER — Other Ambulatory Visit: Payer: Self-pay

## 2017-11-21 MED ORDER — ATORVASTATIN CALCIUM 80 MG PO TABS: 80.0000 mg | ORAL_TABLET | Freq: Every day | ORAL | 5 refills | Status: DC

## 2017-11-24 ENCOUNTER — Encounter: Payer: Self-pay | Admitting: *Deleted

## 2017-11-25 ENCOUNTER — Ambulatory Visit: Payer: Medicare Other | Admitting: Anesthesiology

## 2017-11-25 ENCOUNTER — Encounter: Payer: Self-pay | Admitting: Anesthesiology

## 2017-11-25 ENCOUNTER — Encounter
Admission: RE | Disposition: A | Discharge status: Home / Self Care | Payer: Self-pay | Source: Ambulatory Visit | Attending: Gastroenterology

## 2017-11-25 ENCOUNTER — Ambulatory Visit
Admission: RE | Admit: 2017-11-25 | Discharge: 2017-11-25 | Disposition: A | Discharge status: Home / Self Care | Payer: Medicare Other | Source: Ambulatory Visit | Attending: Gastroenterology | Admitting: Gastroenterology

## 2017-11-25 DIAGNOSIS — H409 Unspecified glaucoma: Secondary | ICD-10-CM | POA: Diagnosis not present

## 2017-11-25 DIAGNOSIS — E785 Hyperlipidemia, unspecified: Secondary | ICD-10-CM | POA: Insufficient documentation

## 2017-11-25 DIAGNOSIS — Z1211 Encounter for screening for malignant neoplasm of colon: Secondary | ICD-10-CM | POA: Diagnosis not present

## 2017-11-25 DIAGNOSIS — Z86010 Personal history of colonic polyps: Secondary | ICD-10-CM | POA: Insufficient documentation

## 2017-11-25 DIAGNOSIS — D125 Benign neoplasm of sigmoid colon: Secondary | ICD-10-CM | POA: Diagnosis not present

## 2017-11-25 DIAGNOSIS — Z79899 Other long term (current) drug therapy: Secondary | ICD-10-CM | POA: Insufficient documentation

## 2017-11-25 DIAGNOSIS — Z7951 Long term (current) use of inhaled steroids: Secondary | ICD-10-CM | POA: Insufficient documentation

## 2017-11-25 DIAGNOSIS — Q439 Congenital malformation of intestine, unspecified: Secondary | ICD-10-CM | POA: Diagnosis not present

## 2017-11-25 DIAGNOSIS — K6389 Other specified diseases of intestine: Secondary | ICD-10-CM | POA: Insufficient documentation

## 2017-11-25 DIAGNOSIS — I10 Essential (primary) hypertension: Secondary | ICD-10-CM | POA: Insufficient documentation

## 2017-11-25 DIAGNOSIS — E119 Type 2 diabetes mellitus without complications: Secondary | ICD-10-CM | POA: Diagnosis not present

## 2017-11-25 DIAGNOSIS — Z87891 Personal history of nicotine dependence: Secondary | ICD-10-CM | POA: Diagnosis not present

## 2017-11-25 DIAGNOSIS — K635 Polyp of colon: Secondary | ICD-10-CM | POA: Diagnosis not present

## 2017-11-25 DIAGNOSIS — Z794 Long term (current) use of insulin: Secondary | ICD-10-CM | POA: Diagnosis not present

## 2017-11-25 DIAGNOSIS — Z538 Procedure and treatment not carried out for other reasons: Secondary | ICD-10-CM | POA: Diagnosis not present

## 2017-11-25 HISTORY — PX: COLONOSCOPY WITH PROPOFOL: SHX5780

## 2017-11-25 HISTORY — DX: Anxiety disorder, unspecified: F41.9

## 2017-11-25 LAB — GLUCOSE, CAPILLARY: Glucose-Capillary: 146 mg/dL — ABNORMAL HIGH (ref 65–99)

## 2017-11-25 SURGERY — COLONOSCOPY WITH PROPOFOL
Anesthesia: General

## 2017-11-25 MED ORDER — SODIUM CHLORIDE 0.9 % IV SOLN: INTRAVENOUS | Status: DC

## 2017-11-25 MED ORDER — FENTANYL CITRATE (PF) 100 MCG/2ML IJ SOLN
INTRAMUSCULAR | Status: AC
  Filled 2017-11-25: qty 2

## 2017-11-25 MED ORDER — LIDOCAINE HCL (PF) 1 % IJ SOLN
INTRAMUSCULAR | Status: AC
  Administered 2017-11-25: 0.3 mL via INTRADERMAL
  Filled 2017-11-25: qty 2

## 2017-11-25 MED ORDER — LIDOCAINE HCL (PF) 1 % IJ SOLN
2.0000 mL | Freq: Once | INTRAMUSCULAR | Status: AC
  Administered 2017-11-25: 0.3 mL via INTRADERMAL

## 2017-11-25 MED ORDER — LACTATED RINGERS IV SOLN
INTRAVENOUS | Status: DC | PRN
  Administered 2017-11-25: 08:00:00 via INTRAVENOUS

## 2017-11-25 MED ORDER — PROPOFOL 500 MG/50ML IV EMUL
INTRAVENOUS | Status: DC | PRN
  Administered 2017-11-25: 50 ug/kg/min via INTRAVENOUS

## 2017-11-25 MED ORDER — LIDOCAINE HCL (CARDIAC) 20 MG/ML IV SOLN
INTRAVENOUS | Status: DC | PRN
  Administered 2017-11-25: 80 mg via INTRAVENOUS

## 2017-11-25 MED ORDER — SODIUM CHLORIDE 0.9 % IV SOLN
INTRAVENOUS | Status: DC
  Administered 2017-11-25: 1000 mL via INTRAVENOUS

## 2017-11-25 MED ORDER — PROPOFOL 10 MG/ML IV BOLUS
INTRAVENOUS | Status: DC | PRN
  Administered 2017-11-25 (×5): 50 mg via INTRAVENOUS

## 2017-11-25 MED ORDER — PROPOFOL 500 MG/50ML IV EMUL
INTRAVENOUS | Status: AC
  Filled 2017-11-25: qty 50

## 2017-11-25 NOTE — Transfer of Care (Signed)
Immediate Anesthesia Transfer of Care Note  Patient: Cathy Murphy  Procedure(s) Performed: COLONOSCOPY WITH PROPOFOL (N/A )  Patient Location: PACU  Anesthesia Type:MAC  Level of Consciousness: sedated  Airway & Oxygen Therapy: Patient Spontanous Breathing  Post-op Assessment: Report given to RN  Post vital signs: Reviewed and stable  Last Vitals:  Vitals:   11/25/17 0653  BP: 130/77  Pulse: 96  Resp: 16  Temp: (!) 36 C  SpO2: 96%    Last Pain: There were no vitals filed for this visit.       Complications: No apparent anesthesia complications

## 2017-11-25 NOTE — Op Note (Signed)
[facility] Gastroenterology Patient Name: Cathy Murphy Procedure Date: 11/25/2017 7:19 AM MRN: [id number] Account #: [id number] Date of Birth: [birth year] Admit Type: Outpatient Age: 77 Room: [facility] ENDO ROOM 3 Gender: Female Note Status: Finalized Procedure:            Colonoscopy Indications:          Personal history of colonic polyps Providers:            [name], MD Referring MD:         Halina Maidens, MD (Referring MD) Complications:        No immediate complications. Procedure:            Pre-Anesthesia Assessment:                       - ASA Grade Assessment: II - A patient with mild                        systemic disease.                       After obtaining informed consent, the colonoscope was                        passed under direct vision. Throughout the procedure,                        the patient's blood pressure, pulse, and oxygen                        saturations were monitored continuously. The                        Colonoscope was introduced through the and advanced to                        the. The Olympus PCF-H180AL colonoscope ( S#: [id number] )                        was introduced through the with the intention of                        advancing to the cecum. The scope was advanced to the                        sigmoid colon before the procedure was aborted.                        Medications were given. The colonoscopy was unusually                        difficult due to restricted mobility of the colon and a                        tortuous colon. Findings:      A 2 mm polyp was found in the distal sigmoid colon. The polyp was       sessile. The polyp was removed with a cold biopsy forceps. Resection and       retrieval were complete.      The recto-sigmoid colon was significantly tortuous. The  scope was       carefully advanced to about 35 cm through several turns, to a severely       angulated location. Despite  multiple position changes , abdominal suport       and change of scope I was unable to pass beyond this location.      The digital rectal exam was normal. Impression:           - One 2 mm polyp in the distal sigmoid colon, removed                        with a cold biopsy forceps. Resected and retrieved.                       - Tortuous colon. Recommendation:       - Perform a virtual colonoscopy at appointment to be                        scheduled. Procedure Code(s):    --- Professional ---                       [phone], 77, Colonoscopy, flexible; with biopsy, single                        or multiple Diagnosis Code(s):    --- Professional ---                       D12.5, Benign neoplasm of sigmoid colon                       Z86.010, Personal history of colonic polyps                       Q43.8, Other specified congenital malformations of                        intestine CPT copyright 2016 American [employer] Association. All rights reserved. The codes documented in this report are preliminary and upon coder review may  be revised to meet current compliance requirements. [name], MD 11/25/2017 8:51:12 AM This report has been signed electronically. Number of Addenda: 0 Note Initiated On: 11/25/2017 7:19 AM Total Procedure Duration: 0 hours 57 minutes 45 seconds       [facility]

## 2017-11-25 NOTE — Progress Notes (Signed)
Ice applied to the rt arm post IV site due to infiltration in procedure room. Pt is resting calmly with no s/s of distress. VS and orders assessed and will continue to monitor and tx pt according to MD orders.

## 2017-11-25 NOTE — Anesthesia Postprocedure Evaluation (Signed)
Anesthesia Post Note  Patient: Cathy Murphy  Procedure(s) Performed: COLONOSCOPY WITH PROPOFOL (N/A )  Patient location during evaluation: PACU Anesthesia Type: General Level of consciousness: awake and alert and oriented Pain management: pain level controlled Vital Signs Assessment: post-procedure vital signs reviewed and stable Respiratory status: spontaneous breathing Cardiovascular status: blood pressure returned to baseline Anesthetic complications: no     Last Vitals:  Vitals:   11/25/17 0955 11/25/17 1005  BP: (!) 149/66 (!) 146/85  Pulse: 69 79  Resp: 19 15  Temp:    SpO2: 100% 100%    Last Pain:  Vitals:   11/25/17 1005  PainSc: 4                  Madeeha Costantino

## 2017-11-25 NOTE — H&P (Signed)
Outpatient short stay form Pre-procedure 11/25/2017 7:31 AM [name] MD  Primary Physician: Dr. Halina Maidens  Reason for visit:  Colonoscopy  History of present illness:  Patient is a 77 year old female with a personal history of adenomatous colon polyps. Her last procedure was 04/02/2012. She takes no aspirin or blood thinning agent. She tolerated her prep well.    Current Facility-Administered Medications:  .  0.9 %  sodium chloride infusion, , Intravenous, Continuous, [name], MD, Last Rate: 20 mL/hr at 11/25/17 0712, 1,000 mL at 11/25/17 0712 .  0.9 %  sodium chloride infusion, , Intravenous, Continuous, [name], MD  Medications Prior to Admission  Medication Sig Dispense Refill Last Dose  . busPIRone (BUSPAR) 7.5 MG tablet Take 7.5 mg by mouth 2 (two) times daily.     . traMADol (ULTRAM) 50 MG tablet Take by mouth every 6 (six) hours as needed.     Marland Kitchen amLODipine (NORVASC) 5 MG tablet TAKE 1 TABLET BY MOUTH EVERY DAY 30 tablet 10 Taking  . atorvastatin (LIPITOR) 80 MG tablet Take 1 tablet (80 mg total) by mouth daily. 30 tablet 5   . B-D UF III MINI PEN NEEDLES 31G X 5 MM MISC USE AS DIRECTED WITH LEVEMIR 100 each 3 Taking  . cholecalciferol (VITAMIN D) 1000 units tablet Take 2,000 Units by mouth daily.   Taking  . fluticasone (FLONASE) 50 MCG/ACT nasal spray Place 2 sprays into both nostrils daily. 16 g 6 Taking  . glucose blood (BAYER CONTOUR TEST) test strip Test once daily. 100 each 12 Taking  . JANUVIA 100 MG tablet TAKE 1 TABLET BY MOUTH EVERY DAY 30 tablet 11 Taking  . LEVEMIR FLEXTOUCH 100 UNIT/ML Pen INJECT 20 UNITS UNDER THE SKIN EVERY DAY (Patient taking differently: INJECT 24 UNITS UNDER THE SKIN EVERY DAY) 15 pen 3 Taking  . lisinopril (PRINIVIL,ZESTRIL) 40 MG tablet TAKE 1 TABLET (40 MG TOTAL) BY MOUTH DAILY. 30 tablet 10 11/25/2017 at 0600  . LUMIGAN 0.01 % SOLN TAKE 1 DROP(S) IN BOTH EYES ONCE IN THE EVENING  5 Taking  . pioglitazone  (ACTOS) 15 MG tablet TAKE 1 TABLET BY MOUTH EVERY DAY 30 tablet 11 Taking  . timolol (TIMOPTIC) 0.5 % ophthalmic solution Place 1 drop into both eyes.  5 Taking  . traZODone (DESYREL) 50 MG tablet TAKE 1 TABLET BY MOUTH AT BEDTIME 30 tablet 7 Taking     Allergies  Allergen Reactions  . Amoxicillin Diarrhea  . Aspirin Nausea And Vomiting  . Metformin And Related Diarrhea  . Oxycodone Other (See Comments)    CNS side effects/agitation  . Sulfa Antibiotics      Past Medical History:  Diagnosis Date  . Allergy   . Anxiety   . Diabetes mellitus without complication ([place])   . Glaucoma   . Hyperlipidemia   . Hypertension     Review of systems:      Physical Exam    Heart and lungs: Regular rate and rhythm without rub or gallop, lungs are bilaterally clear.    HEENT: Normocephalic atraumatic eyes are anicteric    Other:     Pertinant exam for procedure: Soft nontender nondistended bowel sounds positive normoactive    Planned proceedures: Colonoscopy and indicated procedures. I have discussed the risks benefits and complications of procedures to include not limited to bleeding, infection, perforation and the risk of sedation and the patient wishes to proceed.    [name], MD Gastroenterology 11/25/2017  7:31 AM

## 2017-11-25 NOTE — Anesthesia Post-op Follow-up Note (Signed)
Anesthesia QCDR form completed.        

## 2017-11-25 NOTE — Anesthesia Preprocedure Evaluation (Signed)
Anesthesia Evaluation  Patient identified by MRN, date of birth, ID band Patient awake    Reviewed: Allergy & Precautions, NPO status , Patient's Chart, lab work & pertinent test results  Airway Mallampati: II  TM Distance: >3 FB     Dental  (+) Teeth Intact   Pulmonary former smoker,    Pulmonary exam normal        Cardiovascular hypertension, Normal cardiovascular exam     Neuro/Psych Anxiety negative neurological ROS     GI/Hepatic negative GI ROS, Neg liver ROS,   Endo/Other  diabetes  Renal/GU negative Renal ROS  negative genitourinary   Musculoskeletal  (+) Arthritis , Osteoarthritis,    Abdominal Normal abdominal exam  (+)   Peds negative pediatric ROS (+)  Hematology negative hematology ROS (+)   Anesthesia Other Findings   Reproductive/Obstetrics                             Anesthesia Physical Anesthesia Plan  ASA: III  Anesthesia Plan: General   Post-op Pain Management:    Induction: Intravenous  PONV Risk Score and Plan:   Airway Management Planned: Nasal Cannula  Additional Equipment:   Intra-op Plan:   Post-operative Plan:   Informed Consent: I have reviewed the patients History and Physical, chart, labs and discussed the procedure including the risks, benefits and alternatives for the proposed anesthesia with the patient or authorized representative who has indicated his/her understanding and acceptance.   Dental advisory given  Plan Discussed with: CRNA and Surgeon  Anesthesia Plan Comments:         Anesthesia Quick Evaluation

## 2017-11-25 NOTE — Brief Op Note (Signed)
IV Right hand swollen. Removed and replaced left forearm. Warm towel placed on site during procedure.

## 2017-11-26 ENCOUNTER — Encounter: Payer: Self-pay | Admitting: Gastroenterology

## 2017-11-27 LAB — SURGICAL PATHOLOGY

## 2017-11-28 ENCOUNTER — Other Ambulatory Visit: Payer: Self-pay | Admitting: Gastroenterology

## 2017-11-28 DIAGNOSIS — Z539 Procedure and treatment not carried out, unspecified reason: Secondary | ICD-10-CM

## 2017-11-28 DIAGNOSIS — K562 Volvulus: Secondary | ICD-10-CM

## 2017-12-21 ENCOUNTER — Other Ambulatory Visit: Payer: Self-pay | Admitting: Internal Medicine

## 2017-12-24 DIAGNOSIS — J301 Allergic rhinitis due to pollen: Secondary | ICD-10-CM | POA: Diagnosis not present

## 2017-12-30 ENCOUNTER — Other Ambulatory Visit: Payer: Self-pay | Admitting: Internal Medicine

## 2018-01-09 ENCOUNTER — Ambulatory Visit
Admission: RE | Admit: 2018-01-09 | Discharge: 2018-01-09 | Disposition: A | Discharge status: Home / Self Care | Payer: Medicare Other | Source: Ambulatory Visit | Attending: Gastroenterology | Admitting: Gastroenterology

## 2018-01-09 ENCOUNTER — Other Ambulatory Visit: Payer: Self-pay

## 2018-01-09 DIAGNOSIS — K562 Volvulus: Secondary | ICD-10-CM

## 2018-01-09 DIAGNOSIS — Z539 Procedure and treatment not carried out, unspecified reason: Secondary | ICD-10-CM

## 2018-01-09 DIAGNOSIS — K5730 Diverticulosis of large intestine without perforation or abscess without bleeding: Secondary | ICD-10-CM | POA: Diagnosis not present

## 2018-01-16 ENCOUNTER — Other Ambulatory Visit: Payer: Self-pay

## 2018-02-06 ENCOUNTER — Telehealth: Payer: Self-pay | Admitting: Internal Medicine

## 2018-02-06 NOTE — Telephone Encounter (Signed)
resched awv  °

## 2018-02-09 NOTE — Telephone Encounter (Signed)
Called to reschedule AWV currently scheduled on 04/09/18. Program no longer available on Thursdays. LVM requesting returned call.

## 2018-02-11 NOTE — Telephone Encounter (Signed)
04/09/18 AWV rescheduled to 04/08/18.

## 2018-02-25 ENCOUNTER — Other Ambulatory Visit: Payer: Self-pay

## 2018-02-25 MED ORDER — INSULIN DETEMIR 100 UNIT/ML FLEXPEN: PEN_INJECTOR | SUBCUTANEOUS | 3 refills | Status: DC

## 2018-03-03 ENCOUNTER — Other Ambulatory Visit: Payer: Self-pay | Admitting: Internal Medicine

## 2018-03-05 ENCOUNTER — Encounter: Payer: Self-pay | Admitting: Internal Medicine

## 2018-03-05 ENCOUNTER — Ambulatory Visit (INDEPENDENT_AMBULATORY_CARE_PROVIDER_SITE_OTHER): Payer: Medicare Other | Admitting: Internal Medicine

## 2018-03-05 VITALS — BP 118/65 | HR 71 | Resp 16 | Ht 69.0 in | Wt 174.0 lb

## 2018-03-05 DIAGNOSIS — Z1231 Encounter for screening mammogram for malignant neoplasm of breast: Secondary | ICD-10-CM

## 2018-03-05 DIAGNOSIS — E1169 Type 2 diabetes mellitus with other specified complication: Secondary | ICD-10-CM | POA: Diagnosis not present

## 2018-03-05 DIAGNOSIS — Z0000 Encounter for general adult medical examination without abnormal findings: Secondary | ICD-10-CM

## 2018-03-05 DIAGNOSIS — F5101 Primary insomnia: Secondary | ICD-10-CM

## 2018-03-05 DIAGNOSIS — E785 Hyperlipidemia, unspecified: Secondary | ICD-10-CM

## 2018-03-05 DIAGNOSIS — I700 Atherosclerosis of aorta: Secondary | ICD-10-CM | POA: Diagnosis not present

## 2018-03-05 DIAGNOSIS — Z0001 Encounter for general adult medical examination with abnormal findings: Secondary | ICD-10-CM | POA: Diagnosis not present

## 2018-03-05 DIAGNOSIS — Z794 Long term (current) use of insulin: Secondary | ICD-10-CM | POA: Diagnosis not present

## 2018-03-05 DIAGNOSIS — E119 Type 2 diabetes mellitus without complications: Secondary | ICD-10-CM

## 2018-03-05 DIAGNOSIS — I10 Essential (primary) hypertension: Secondary | ICD-10-CM | POA: Diagnosis not present

## 2018-03-05 LAB — POCT URINALYSIS DIPSTICK
Bilirubin, UA: NEGATIVE
Blood, UA: NEGATIVE
Glucose, UA: NEGATIVE
KETONES UA: NEGATIVE
Nitrite, UA: NEGATIVE
PH UA: 7 (ref 5.0–8.0)
Protein, UA: NEGATIVE
Spec Grav, UA: 1.015 (ref 1.010–1.025)
UROBILINOGEN UA: 0.2 U/dL

## 2018-03-05 MED ORDER — PIOGLITAZONE HCL 15 MG PO TABS: 15.0000 mg | ORAL_TABLET | Freq: Every day | ORAL | 3 refills | Status: DC

## 2018-03-05 NOTE — Progress Notes (Signed)
Date:  03/05/2018   Name:  Cathy Murphy   DOB:  [birth year]   MRN:  [id number]   Chief Complaint: Annual Exam; Hypertension (coughing dry tickle feels it is from Lisinopril ); and Diabetes (BS Range 90-120) Cathy Murphy is a 77 y.o. female who presents today for her Complete Annual Exam. She feels well. She reports exercising walking some. She reports she is sleeping fairly well. She declines DEXA and all vaccinations.  Diabetes  She presents for her follow-up diabetic visit. She has type 2 diabetes mellitus. Her disease course has been stable. Pertinent negatives for hypoglycemia include no dizziness, headaches, nervousness/anxiousness or tremors. Pertinent negatives for diabetes include no chest pain, no fatigue, no polydipsia and no polyuria. Current diabetic treatment includes oral agent (dual therapy) and insulin injections. She is compliant with treatment all of the time. Her weight is stable. She monitors blood glucose at home 1-2 x per week. Her breakfast blood glucose is taken between 7-8 am. Her breakfast blood glucose range is generally 110-130 mg/dl.  Hypertension  This is a chronic problem. The problem is unchanged. The problem is controlled. Pertinent negatives include no chest pain, headaches, palpitations or shortness of breath. Past treatments include ACE inhibitors and calcium channel blockers. The current treatment provides significant improvement.   Colon cancer screening - had virtual test in March.  She has not heard yet from GI.  I reviewed the study - no obvious polyp, no suspicious lesions.  There were diverticuli and a tortuous sigmoid colon.  They also noted coronary and aortic calcification - not quantified. She had cardiac workup 2011 that was normal.  Cough - pt has a dry cough intermittently - about once a week will have an episode lasting several minutes.  No daily tickle or throat clearing.  Doubt this is ACE but she is advised she can stop it briefly to see if  there is change in sx.  Most likely PND - recommend claritin or allegra.  Review of Systems  Constitutional: Negative for chills, fatigue and fever.  HENT: Negative for congestion, hearing loss, tinnitus, trouble swallowing and voice change.   Eyes: Negative for visual disturbance.  Respiratory: Positive for cough. Negative for chest tightness, shortness of breath and wheezing.   Cardiovascular: Negative for chest pain, palpitations and leg swelling.  Gastrointestinal: Negative for abdominal pain, constipation, diarrhea and vomiting.  Endocrine: Negative for polydipsia and polyuria.  Genitourinary: Negative for dysuria, frequency, genital sores, vaginal bleeding and vaginal discharge.  Musculoskeletal: Negative for arthralgias, gait problem and joint swelling.  Skin: Negative for color change and rash.  Allergic/Immunologic: Positive for environmental allergies.  Neurological: Negative for dizziness, tremors, light-headedness and headaches.  Hematological: Negative for adenopathy. Does not bruise/bleed easily.  Psychiatric/Behavioral: Negative for dysphoric mood and sleep disturbance. The patient is not nervous/anxious.     Patient Active Problem List   Diagnosis Date Noted  . 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 Ventral hernia without obstruction or gangrene 03/06/2016  . Controlled type 2 diabetes mellitus without complication, with long-term current use of insulin ([place]) 11/09/2015  . Plantar fasciitis, right 06/16/2015  . Knee torn cartilage 04/26/2015  . Allergic rhinitis, seasonal 04/26/2015  . Essential (primary) hypertension 04/26/2015  . Glaucoma 04/26/2015  . Hyperlipidemia associated with type 2 diabetes mellitus ([place]) 04/26/2015  . Insomnia 04/26/2015    Prior to Admission medications   Medication Sig Start Date End Date Taking? Authorizing Provider  amLODipine (NORVASC) 5 MG tablet TAKE 1 TABLET  BY MOUTH EVERY DAY 12/30/17  Yes Glean Hess, MD  atorvastatin  (LIPITOR) 80 MG tablet Take 1 tablet (80 mg total) by mouth daily. 11/21/17  Yes Glean Hess, MD  B-D UF III MINI PEN NEEDLES 31G X 5 MM MISC USE AS DIRECTED WITH LEVEMIR 06/03/17  Yes Glean Hess, MD  busPIRone (BUSPAR) 7.5 MG tablet Take 7.5 mg by mouth 2 (two) times daily.   Yes [provider]  cholecalciferol (VITAMIN D) 1000 units tablet Take 2,000 Units by mouth daily.   Yes [provider]  fluticasone (FLONASE) 50 MCG/ACT nasal spray Place 2 sprays into both nostrils daily. 08/28/17  Yes Glean Hess, MD  glipiZIDE (GLUCOTROL) 5 MG tablet Take 5 mg by mouth 2 (two) times daily. 02/10/18  Yes [provider]  glucose blood (BAYER CONTOUR TEST) test strip Test once daily. 05/14/17  Yes Glean Hess, MD  Insulin Detemir (LEVEMIR FLEXTOUCH) 100 UNIT/ML Pen INJECT 24 UNITS UNDER THE SKIN EVERY DAY 02/25/18  Yes Glean Hess, MD  JANUVIA 100 MG tablet TAKE 1 TABLET BY MOUTH EVERY DAY 03/03/18  Yes Glean Hess, MD  lisinopril (PRINIVIL,ZESTRIL) 40 MG tablet TAKE 1 TABLET (40 MG TOTAL) BY MOUTH DAILY. 11/01/17  Yes Glean Hess, MD  LUMIGAN 0.01 % SOLN TAKE 1 DROP(S) IN BOTH EYES ONCE IN THE EVENING 03/01/15  Yes [provider]  pioglitazone (ACTOS) 15 MG tablet TAKE 1 TABLET BY MOUTH EVERY DAY 03/02/17  Yes Glean Hess, MD  timolol (TIMOPTIC) 0.5 % ophthalmic solution Place 1 drop into both eyes. 02/15/16  Yes [provider]  traMADol (ULTRAM) 50 MG tablet Take by mouth every 6 (six) hours as needed.   Yes [provider]  traZODone (DESYREL) 50 MG tablet TAKE 1 TABLET BY MOUTH EVERYDAY AT BEDTIME 12/22/17  Yes Glean Hess, MD    Allergies  Allergen Reactions  . Amoxicillin Diarrhea  . Aspirin Nausea And Vomiting  . Metformin And Related Diarrhea  . Oxycodone Other (See Comments)    CNS side effects/agitation  . Sulfa Antibiotics     Past Surgical History:  Procedure Laterality Date  .  ABDOMINAL HYSTERECTOMY    . COLONOSCOPY WITH PROPOFOL N/A 11/25/2017   Procedure: COLONOSCOPY WITH PROPOFOL;  Surgeon: [name], MD;  [facility] ENDOSCOPY;  Service: Endoscopy;  Laterality: N/A;  . LIPOMA RESECTION     right posterior neck    Social History   Tobacco Use  . Smoking status: Former Research scientist (life sciences)  . Smokeless tobacco: Never Used  Substance Use Topics  . Alcohol use: No  . Drug use: No     Medication list has been reviewed and updated.  PHQ 2/9 Scores 03/05/2018 04/07/2017 03/13/2017 03/06/2016  PHQ - 2 Score 0 2 0 1  PHQ- 9 Score - 2 - -    Physical Exam  Constitutional: She is oriented to person, place, and time. She appears well-developed and well-nourished. No distress.  HENT:  Head: Normocephalic and atraumatic.  Right Ear: Tympanic membrane and ear canal normal.  Left Ear: Tympanic membrane and ear canal normal.  Nose: Right sinus exhibits no maxillary sinus tenderness. Left sinus exhibits no maxillary sinus tenderness.  Mouth/Throat: Uvula is midline and oropharynx is clear and moist.  Eyes: Conjunctivae and EOM are normal. Right eye exhibits no discharge. Left eye exhibits no discharge. No scleral icterus.  Neck: Normal range of motion. Carotid bruit is not present. No erythema present.  No thyromegaly present.  Cardiovascular: Normal rate, regular rhythm, normal heart sounds and normal pulses.  Pulmonary/Chest: Effort normal. No respiratory distress. She has no wheezes. Right breast exhibits no mass, no nipple discharge, no skin change and no tenderness. Left breast exhibits no mass, no nipple discharge, no skin change and no tenderness.  Abdominal: Soft. Bowel sounds are normal. There is no hepatosplenomegaly. There is no tenderness. There is no CVA tenderness.  Musculoskeletal: Normal range of motion.  Lymphadenopathy:    She has no cervical adenopathy.    She has no axillary adenopathy.  Neurological: She is alert and oriented to person, place, and  time. She has normal reflexes. No cranial nerve deficit or sensory deficit.  Skin: Skin is warm, dry and intact. No rash noted.  Psychiatric: She has a normal mood and affect. Her speech is normal and behavior is normal. Thought content normal.  Nursing note and vitals reviewed.   BP 118/65   Pulse 71   Resp 16   Ht 5\' 9"  (1.753 m)   Wt 174 lb (78.9 kg)   SpO2 100%   BMI 25.70 kg/m   Assessment and Plan: 1. Annual physical exam Also having MAW in June - POCT urinalysis dipstick  2. Controlled type 2 diabetes mellitus without complication, with long-term current use of insulin (H[facility]) Doing well on multiple agents - pioglitazone (ACTOS) 15 MG tablet; Take 1 tablet (15 mg total) by mouth daily.  Dispense: 90 tablet; Refill: 3 - Comprehensive metabolic panel - Hemoglobin A1c  3. Hyperlipidemia associated with type 2 diabetes mellitus ([place]) On high dose statin therapy - Lipid panel  4. Encounter for screening mammogram for breast cancer - MM DIGITAL SCREENING BILATERAL; Future  5. Essential (primary) hypertension controlled - CBC with Differential/Platelet  6. Primary insomnia unchanged - TSH  7. Aortic calcification (HCC) Noted on CT colon Optimized treatment of lipids, HTN, DM Patient had normal cardiac stress test 2011 - denies current sx Discussed s/s of CAD and urged pt to follow up immediately if noted   Meds ordered this encounter  Medications  . pioglitazone (ACTOS) 15 MG tablet    Sig: Take 1 tablet (15 mg total) by mouth daily.    Dispense:  90 tablet    Refill:  3    Partially dictated using Editor, commissioning. Any errors are unintentional.  Halina Maidens, MD [facility]  03/05/2018

## 2018-03-05 NOTE — Patient Instructions (Signed)
Try Claritin or Allegra over the counter

## 2018-03-06 ENCOUNTER — Telehealth: Payer: Self-pay

## 2018-03-06 LAB — HEMOGLOBIN A1C
ESTIMATED AVERAGE GLUCOSE: 160 mg/dL
HEMOGLOBIN A1C: 7.2 % — AB (ref 4.8–5.6)

## 2018-03-06 LAB — COMPREHENSIVE METABOLIC PANEL
ALBUMIN: 4.6 g/dL (ref 3.5–4.8)
ALK PHOS: 87 IU/L (ref 39–117)
ALT: 13 IU/L (ref 0–32)
AST: 19 IU/L (ref 0–40)
Albumin/Globulin Ratio: 1.8 (ref 1.2–2.2)
BILIRUBIN TOTAL: 1.5 mg/dL — AB (ref 0.0–1.2)
BUN / CREAT RATIO: 16 (ref 12–28)
BUN: 16 mg/dL (ref 8–27)
CHLORIDE: 100 mmol/L (ref 96–106)
CO2: 23 mmol/L (ref 20–29)
Calcium: 9.4 mg/dL (ref 8.7–10.3)
Creatinine, Ser: 1.03 mg/dL — ABNORMAL HIGH (ref 0.57–1.00)
GFR calc Af Amer: 62 mL/min/{1.73_m2} (ref >59)
GFR calc non Af Amer: 54 mL/min/{1.73_m2} — ABNORMAL LOW (ref >59)
GLOBULIN, TOTAL: 2.5 g/dL (ref 1.5–4.5)
GLUCOSE: 91 mg/dL (ref 65–99)
POTASSIUM: 4.3 mmol/L (ref 3.5–5.2)
SODIUM: 143 mmol/L (ref 134–144)
Total Protein: 7.1 g/dL (ref 6.0–8.5)

## 2018-03-06 LAB — CBC WITH DIFFERENTIAL/PLATELET
BASOS ABS: 0 10*3/uL (ref 0.0–0.2)
Basos: 1 %
EOS (ABSOLUTE): 0.1 10*3/uL (ref 0.0–0.4)
EOS: 2 %
HEMATOCRIT: 42.3 % (ref 34.0–46.6)
Hemoglobin: 13.5 g/dL (ref 11.1–15.9)
Immature Grans (Abs): 0 10*3/uL (ref 0.0–0.1)
Immature Granulocytes: 0 %
LYMPHS ABS: 1.5 10*3/uL (ref 0.7–3.1)
Lymphs: 28 %
MCH: 28.7 pg (ref 26.6–33.0)
MCHC: 31.9 g/dL (ref 31.5–35.7)
MCV: 90 fL (ref 79–97)
MONOCYTES: 6 %
MONOS ABS: 0.3 10*3/uL (ref 0.1–0.9)
NEUTROS ABS: 3.5 10*3/uL (ref 1.4–7.0)
Neutrophils: 63 %
Platelets: 212 10*3/uL (ref 150–379)
RBC: 4.71 x10E6/uL (ref 3.77–5.28)
RDW: 13.8 % (ref 12.3–15.4)
WBC: 5.4 10*3/uL (ref 3.4–10.8)

## 2018-03-06 LAB — TSH: TSH: 1.43 u[IU]/mL (ref 0.450–4.500)

## 2018-03-06 LAB — LIPID PANEL
Chol/HDL Ratio: 3.8 ratio (ref 0.0–4.4)
Cholesterol, Total: 207 mg/dL — ABNORMAL HIGH (ref 100–199)
HDL: 55 mg/dL (ref >39)
LDL Calculated: 135 mg/dL — ABNORMAL HIGH (ref 0–99)
Triglycerides: 87 mg/dL (ref 0–149)
VLDL Cholesterol Cal: 17 mg/dL (ref 5–40)

## 2018-03-06 NOTE — Telephone Encounter (Signed)
Wants refill Januvia

## 2018-03-06 NOTE — Telephone Encounter (Signed)
Already done on 03/03/18.  Needs to check with pharmacy.

## 2018-03-13 ENCOUNTER — Other Ambulatory Visit: Payer: Self-pay | Admitting: Internal Medicine

## 2018-04-01 ENCOUNTER — Other Ambulatory Visit: Payer: Self-pay | Admitting: Internal Medicine

## 2018-04-08 ENCOUNTER — Ambulatory Visit: Payer: Self-pay

## 2018-04-09 ENCOUNTER — Ambulatory Visit: Payer: Medicare Other

## 2018-04-09 DIAGNOSIS — Z9289 Personal history of other medical treatment: Secondary | ICD-10-CM | POA: Diagnosis not present

## 2018-04-09 DIAGNOSIS — Z1231 Encounter for screening mammogram for malignant neoplasm of breast: Secondary | ICD-10-CM | POA: Diagnosis not present

## 2018-04-15 ENCOUNTER — Ambulatory Visit (INDEPENDENT_AMBULATORY_CARE_PROVIDER_SITE_OTHER): Payer: Medicare Other

## 2018-04-15 VITALS — BP 122/62 | HR 73 | Temp 98.3°F | Resp 12 | Ht 69.0 in | Wt 175.0 lb

## 2018-04-15 DIAGNOSIS — Z0000 Encounter for general adult medical examination without abnormal findings: Secondary | ICD-10-CM

## 2018-04-15 NOTE — Progress Notes (Signed)
Subjective:   Cathy Murphy is a 77 y.o. female who presents for Medicare Annual (Subsequent) preventive examination.  Review of Systems:  N/A Cardiac Risk Factors include: diabetes mellitus;dyslipidemia;hypertension;advanced age (>46men, >28 women);sedentary lifestyle     Objective:     Vitals: BP 122/62 (BP Location: Right Arm, Patient Position: Sitting, Cuff Size: Normal)   Pulse 73   Temp 98.3 F (36.8 C) (Oral)   Resp 12   Ht 5\' 9"  (1.753 m)   Wt 175 lb (79.4 kg)   SpO2 95%   BMI 25.84 kg/m   Body mass index is 25.84 kg/m.  Advanced Directives 04/15/2018 03/05/2018 03/05/2018 11/25/2017 04/07/2017 03/27/2016 11/09/2015  Does Patient Have a Medical Advance Directive? No Yes Yes No No No No  Type of Advance Directive - [place];Living will - - - - -  Does patient want to make changes to medical advance directive? Yes (MAU/Ambulatory/Procedural Areas - Information given) No - Patient declined No - Patient declined - - - -  Copy of Healthcare Power of Attorney in Chart? - No - copy requested - - - - -  Would patient like information on creating a medical advance directive? - - - No - Patient declined Yes (MAU/Ambulatory/Procedural Areas - Information given) No - patient declined information No - patient declined information    Tobacco Social History   Tobacco Use  Smoking Status Former Smoker  . Packs/day: 1.00  . Years: 7.00  . Pack years: 7.00  . Types: Cigarettes  . Last attempt to quit: 1965  . Years since quitting: 54.4  Smokeless Tobacco Never Used  Tobacco Comment   smoking cessation materials not required     Counseling given: No Comment: smoking cessation materials not required  Clinical Intake:  Pre-visit preparation completed: Yes  Pain : No/denies pain   BMI - recorded: 25.84 Nutritional Status: BMI 25 -29 Overweight Nutritional Risks: None  Nutrition Risk Assessment: Has the patient had any N/V/D within the last 2 months?   No Does the patient have any non-healing wounds?  No Has the patient had any unintentional weight loss or weight gain?  No  Is the patient diabetic?  Yes If diabetic, was a CBG obtained today?  No Did the patient bring in their glucometer from home?  No Comments: Pt monitors CBG's twice per week. Denies any financial strains with the device or supplies.  Diabetic Exams: Diabetic Eye Exam: Completed 05/23/17 Diabetic Foot Exam: Completed 03/05/18  How often do you need to have someone help you when you read instructions, pamphlets, or other written materials from your doctor or pharmacy?: 1 - Never  Interpreter Needed?: No  Information entered by :: Idell Pickles, LPN  Past Medical History:  Diagnosis Date  . Allergy   . Anxiety   . Diabetes mellitus without complication ([place])   . Glaucoma   . Hyperlipidemia   . Hypertension    Past Surgical History:  Procedure Laterality Date  . ABDOMINAL HYSTERECTOMY    . COLONOSCOPY WITH PROPOFOL N/A 11/25/2017   Procedure: COLONOSCOPY WITH PROPOFOL;  Surgeon: [name], MD;  Location[facility] ENDOSCOPY;  Service: Endoscopy;  Laterality: N/A;  . LIPOMA RESECTION     right posterior neck   Family History  Problem Relation Age of Onset  . Cancer Mother        Breast  . Heart disease Father   . Heart attack Brother   . Cancer Maternal Aunt  breast   Social History   Socioeconomic History  . Marital status: Widowed    Spouse name: Not on file  . Number of children: 1  . Years of education: Not on file  . Highest education level: 12th grade  Occupational History    Employer: [employer]  . Financial resource strain: Not hard at all  . Food insecurity:    Worry: Never true    Inability: Never true  . Transportation needs:    Medical: No    Non-medical: No  Tobacco Use  . Smoking status: Former Smoker    Packs/day: 1.00    Years: 7.00    Pack years: 7.00    Types: Cigarettes    Last attempt to quit: 1965     Years since quitting: 54.4  . Smokeless tobacco: Never Used  . Tobacco comment: smoking cessation materials not required  Substance and Sexual Activity  . Alcohol use: No  . Drug use: No  . Sexual activity: Never  Lifestyle  . Physical activity:    Days per week: 0 days    Minutes per session: 0 min  . Stress: Not at all  Relationships  . Social connections:    Talks on phone: Patient refused    Gets together: Patient refused    Attends religious service: Patient refused    Active member of club or organization: Patient refused    Attends meetings of clubs or organizations: Patient refused    Relationship status: Widowed  Other Topics Concern  . Not on file  Social History Narrative  . Not on file    Outpatient Encounter Medications as of 04/15/2018  Medication Sig  . amLODipine (NORVASC) 5 MG tablet TAKE 1 TABLET BY MOUTH EVERY DAY  . atorvastatin (LIPITOR) 80 MG tablet Take 1 tablet (80 mg total) by mouth daily.  . B-D UF III MINI PEN NEEDLES 31G X 5 MM MISC USE AS DIRECTED WITH LEVEMIR  . cholecalciferol (VITAMIN D) 1000 units tablet Take 2,000 Units by mouth daily.  Marland Kitchen glipiZIDE (GLUCOTROL) 5 MG tablet TAKE 1 TABLET BY MOUTH TWICE A DAY  . glucose blood (BAYER CONTOUR TEST) test strip Test once daily.  . Insulin Detemir (LEVEMIR FLEXTOUCH) 100 UNIT/ML Pen INJECT 24 UNITS UNDER THE SKIN EVERY DAY  . JANUVIA 100 MG tablet TAKE 1 TABLET BY MOUTH EVERY DAY  . lisinopril (PRINIVIL,ZESTRIL) 40 MG tablet TAKE 1 TABLET (40 MG TOTAL) BY MOUTH DAILY.  Marland Kitchen LUMIGAN 0.01 % SOLN TAKE 1 DROP(S) IN BOTH EYES ONCE IN THE EVENING  . pioglitazone (ACTOS) 15 MG tablet Take 1 tablet (15 mg total) by mouth daily.  . timolol (TIMOPTIC) 0.5 % ophthalmic solution Place 1 drop into both eyes.  . traZODone (DESYREL) 50 MG tablet TAKE 1 TABLET BY MOUTH EVERYDAY AT BEDTIME  . busPIRone (BUSPAR) 7.5 MG tablet Take 7.5 mg by mouth 2 (two) times daily.  . traMADol (ULTRAM) 50 MG tablet Take by mouth every  6 (six) hours as needed.   No facility-administered encounter medications on file as of 04/15/2018.     Activities of Daily Living In your present state of health, do you have any difficulty performing the following activities: 04/15/2018 03/05/2018  Hearing? N N  Comment denies hearing aids -  Vision? N N  Comment wears eyeglasses; glaucoma -  Difficulty concentrating or making decisions? N N  Walking or climbing stairs? N N  Dressing or bathing? N N  Doing errands, shopping? N  N  Preparing Food and eating ? N -  Comment denies dentures -  Using the Toilet? N -  In the past six months, have you accidently leaked urine? N -  Do you have problems with loss of bowel control? N -  Managing your Medications? N -  Managing your Finances? N -  Housekeeping or managing your Housekeeping? N -  Some recent data might be hidden    Patient Care Team: Glean Hess, MD as PCP - General (Family Medicine) [facility] (Ophthalmology)    Assessment:   This is a routine wellness examination for [place].  Exercise Activities and Dietary recommendations Current Exercise Habits: The patient does not participate in regular exercise at present, Exercise limited by: None identified  Goals    . DIET - INCREASE WATER INTAKE     Recommend to drink at least 6-8 8oz glasses of water per day.       Fall Risk Fall Risk  04/15/2018 03/05/2018 04/07/2017 03/13/2017 03/06/2016  Falls in the past year? No No No No No  Risk for fall due to : Impaired vision;Medication side effect - - - -  Risk for fall due to: Comment wears eyeglasses - - - -   FALL RISK PREVENTION PERTAINING TO HOME: Is your home free of loose throw rugs in walkways, pet beds, electrical cords, etc? Yes Is there adequate lighting in your home to reduce risk of falls?  Yes Are there stairs in or around your home WITH handrails? Yes  ASSISTIVE DEVICES UTILIZED TO PREVENT FALLS: Use of a cane, walker or w/c? No Grab bars in the bathroom?  No  Shower chair or a place to sit while bathing? No An elevated toilet seat or a handicapped toilet? Yes  Timed Get Up and Go Performed: Yes. Pt ambulated 10 feet within 7 sec. Gait stead-fast and without the use of an assistive device. No intervention required at this time. Fall risk prevention has been discussed.  Community Resource Referral:  Pt declined my offer to send [employer] Referral to Care Guide for installation of grab bars in the shower or a shower chair.  Depression Screen PHQ 2/9 Scores 04/15/2018 03/05/2018 04/07/2017 03/13/2017  PHQ - 2 Score 0 0 2 0  PHQ- 9 Score 0 - 2 -     Cognitive Function     6CIT Screen 04/15/2018 04/07/2017  What Year? 0 points 0 points  What month? 0 points 0 points  What time? 0 points 0 points  Count back from 20 2 points 0 points  Months in reverse 2 points 0 points  Repeat phrase 2 points 0 points  Total Score 6 0    Immunization History  Administered Date(s) Administered  . Influenza-Unspecified 03/06/2016  . Td 03/05/2011    Qualifies for Shingles Vaccine? Yes. Due for Shingrix. Education has been provided regarding the importance of this vaccine. Pt has been advised to call her insurance company to determine her out of pocket expense. Advised she may also receive this vaccine at her local pharmacy or Health Dept. Verbalized acceptance and understanding.  Overdue for Flu vaccine. Education has been provided regarding the importance of this vaccine and advised to receive when available. Verbalized acceptance and understanding.  Due for Pneumoccocal vaccine. Declined my offer to administer today. Education has been provided regarding the importance of this vaccine but still declined. Pt has been advised to call our office if she should change her mind and wish to receive this vaccine.  Also advised she may receive this vaccine at her local pharmacy or Health Dept. Pt is aware to provide a copy of her vaccination record if she  chooses to receive this vaccine at her local pharmacy. Verbalized acceptance and understanding.  Screening Tests Health Maintenance  Topic Date Due  . DEXA SCAN  03/06/2019 (Originally 10/28/2008)  . PNA vac Low Risk Adult (1 of 2 - PCV13) 03/06/2019 (Originally 10/28/2008)  . OPHTHALMOLOGY EXAM  05/23/2018  . INFLUENZA VACCINE  06/04/2018  . HEMOGLOBIN A1C  09/05/2018  . FOOT EXAM  03/06/2019  . MAMMOGRAM  04/10/2019  . TETANUS/TDAP  03/04/2021  . COLONOSCOPY  11/26/2027    Cancer Screenings: Lung: Low Dose CT Chest recommended if Age 20-80 years, 30 pack-year currently smoking OR have quit w/in 15years. Patient does not qualify. Breast:  Up to date on Mammogram? Yes. Completed 04/09/18. Repeat every year.   Up to date of Bone Density/Dexa? No. Declined my offer to order this screening. Education provided re: the importance of this screening but still declined. Colorectal: Completed 01/09/18. Repeat every 10 years.  Additional Screenings: Hepatitis C Screening: Does not qualify    Plan:  I have personally reviewed and addressed the Medicare Annual Wellness questionnaire and have noted the following in the patient's chart:  A. Medical and social history B. Use of alcohol, tobacco or illicit drugs  C. Current medications and supplements D. Functional ability and status E.  Nutritional status F.  Physical activity G. Advance directives H. List of other physicians I.  Hospitalizations, surgeries, and ER visits in previous 12 months J.  [place] such as hearing and vision if needed, cognitive and depression L. Referrals and appointments  In addition, I have reviewed and discussed with patient certain preventive protocols, quality metrics, and best practice recommendations. A written personalized care plan for preventive services as well as general preventive health recommendations were provided to patient.  Signed,  Aleatha Borer, LPN Nurse Health Advisor  MD  Recommendations: Due for Shingrix. Education has been provided regarding the importance of this vaccine. Pt has been advised to call her insurance company to determine her out of pocket expense. Advised she may also receive this vaccine at her local pharmacy or Health Dept. Verbalized acceptance and understanding.  Overdue for Flu vaccine. Education has been provided regarding the importance of this vaccine and advised to receive when available. Verbalized acceptance and understanding.  Due for Pneumoccocal vaccine. Declined my offer to administer today. Education has been provided regarding the importance of this vaccine but still declined. Pt has been advised to call our office if she should change her mind and wish to receive this vaccine. Also advised she may receive this vaccine at her local pharmacy or Health Dept. Pt is aware to provide a copy of her vaccination record if she chooses to receive this vaccine at her local pharmacy. Verbalized acceptance and understanding.  Bone Density/Dexa: Declined my offer to order this screening. Education provided re: the importance of this screening but still declined.

## 2018-04-15 NOTE — Patient Instructions (Signed)
Cathy Murphy , Thank you for taking time to come for your Medicare Wellness Visit. I appreciate your ongoing commitment to your health goals. Please review the following plan we discussed and let me know if I can assist you in the future.   Screening recommendations/referrals: Colorectal Screening: Up to date Mammogram: Up to date Bone Density: Declined  Vision and Dental Exams: Recommended annual ophthalmology exams for early detection of glaucoma and other disorders of the eye Recommended annual dental exams for proper oral hygiene  Diabetic Exams: Recommended annual diabetic eye exams for early detection of retinopathy Recommended annual diabetic foot exams for early detection of peripheral neuropathy.  Diabetic Eye Exam: Up to date Diabetic Foot Exam: Up to date  Vaccinations: Influenza vaccine: Overdue Pneumococcal vaccine: Declined Tdap vaccine: Up to date Shingles vaccine: Please call your insurance company to determine your out of pocket expense for the Shingrix vaccine. You may also receive this vaccine at your local pharmacy or Health Dept.  Advanced directives: Advance directive discussed with you today. I have provided a copy for you to complete at home and have notarized. Once this is complete please bring a copy in to our office so we can scan it into your chart.  Conditions/risks identified: Recommend to drink at least 6-8 8oz glasses of water per day.  Next appointment: Please schedule your Annual Wellness Visit with your Nurse Health Advisor in one year.  Preventive Care 75 Years and Older, Female Preventive care refers to lifestyle choices and visits with your health care provider that can promote health and wellness. What does preventive care include?  A yearly physical exam. This is also called an annual well check.  Dental exams once or twice a year.  Routine eye exams. Ask your health care provider how often you should have your eyes checked.  Personal  lifestyle choices, including:  Daily care of your teeth and gums.  Regular physical activity.  Eating a healthy diet.  Avoiding tobacco and drug use.  Limiting alcohol use.  Practicing safe sex.  Taking low-dose aspirin every day.  Taking vitamin and mineral supplements as recommended by your health care provider. What happens during an annual well check? The services and screenings done by your health care provider during your annual well check will depend on your age, overall health, lifestyle risk factors, and family history of disease. Counseling  Your health care provider may ask you questions about your:  Alcohol use.  Tobacco use.  Drug use.  Emotional well-being.  Home and relationship well-being.  Sexual activity.  Eating habits.  History of falls.  Memory and ability to understand (cognition).  Work and work Statistician.  Reproductive health. Screening  You may have the following tests or measurements:  Height, weight, and BMI.  Blood pressure.  Lipid and cholesterol levels. These may be checked every 5 years, or more frequently if you are over 75 years old.  Skin check.  Lung cancer screening. You may have this screening every year starting at age 75 if you have a 30-pack-year history of smoking and currently smoke or have quit within the past 15 years.  Fecal occult blood test (FOBT) of the stool. You may have this test every year starting at age 75.  Flexible sigmoidoscopy or colonoscopy. You may have a sigmoidoscopy every 5 years or a colonoscopy every 10 years starting at age 75.  Hepatitis C blood test.  Hepatitis B blood test.  Sexually transmitted disease (STD) testing.  Diabetes screening. This  is done by checking your blood sugar (glucose) after you have not eaten for a while (fasting). You may have this done every 1-3 years.  Bone density scan. This is done to screen for osteoporosis. You may have this done starting at age  20.  Mammogram. This may be done every 1-2 years. Talk to your health care provider about how often you should have regular mammograms. Talk with your health care provider about your test results, treatment options, and if necessary, the need for more tests. Vaccines  Your health care provider may recommend certain vaccines, such as:  Influenza vaccine. This is recommended every year.  Tetanus, diphtheria, and acellular pertussis (Tdap, Td) vaccine. You may need a Td booster every 10 years.  Zoster vaccine. You may need this after age 75.  Pneumococcal 13-valent conjugate (PCV13) vaccine. One dose is recommended after age 75.  Pneumococcal polysaccharide (PPSV23) vaccine. One dose is recommended after age 75. Talk to your health care provider about which screenings and vaccines you need and how often you need them. This information is not intended to replace advice given to you by your health care provider. Make sure you discuss any questions you have with your health care provider. Document Released: 11/17/2015 Document Revised: 07/10/2016 Document Reviewed: 08/22/2015 Elsevier Interactive Patient Education  2017 [place] Prevention in the Home Falls can cause injuries. They can happen to people of all ages. There are many things you can do to make your home safe and to help prevent falls. What can I do on the outside of my home?  Regularly fix the edges of walkways and driveways and fix any cracks.  Remove anything that might make you trip as you walk through a door, such as a raised step or threshold.  Trim any bushes or trees on the path to your home.  Use bright outdoor lighting.  Clear any walking paths of anything that might make someone trip, such as rocks or tools.  Regularly check to see if handrails are loose or broken. Make sure that both sides of any steps have handrails.  Any raised decks and porches should have guardrails on the edges.  Have any  leaves, snow, or ice cleared regularly.  Use sand or salt on walking paths during winter.  Clean up any spills in your garage right away. This includes oil or grease spills. What can I do in the bathroom?  Use night lights.  Install grab bars by the toilet and in the tub and shower. Do not use towel bars as grab bars.  Use non-skid mats or decals in the tub or shower.  If you need to sit down in the shower, use a plastic, non-slip stool.  Keep the floor dry. Clean up any water that spills on the floor as soon as it happens.  Remove soap buildup in the tub or shower regularly.  Attach bath mats securely with double-sided non-slip rug tape.  Do not have throw rugs and other things on the floor that can make you trip. What can I do in the bedroom?  Use night lights.  Make sure that you have a light by your bed that is easy to reach.  Do not use any sheets or blankets that are too big for your bed. They should not hang down onto the floor.  Have a firm chair that has side arms. You can use this for support while you get dressed.  Do not have throw rugs and other  things on the floor that can make you trip. What can I do in the kitchen?  Clean up any spills right away.  Avoid walking on wet floors.  Keep items that you use a lot in easy-to-reach places.  If you need to reach something above you, use a strong step stool that has a grab bar.  Keep electrical cords out of the way.  Do not use floor polish or wax that makes floors slippery. If you must use wax, use non-skid floor wax.  Do not have throw rugs and other things on the floor that can make you trip. What can I do with my stairs?  Do not leave any items on the stairs.  Make sure that there are handrails on both sides of the stairs and use them. Fix handrails that are broken or loose. Make sure that handrails are as long as the stairways.  Check any carpeting to make sure that it is firmly attached to the stairs.  Fix any carpet that is loose or worn.  Avoid having throw rugs at the top or bottom of the stairs. If you do have throw rugs, attach them to the floor with carpet tape.  Make sure that you have a light switch at the top of the stairs and the bottom of the stairs. If you do not have them, ask someone to add them for you. What else can I do to help prevent falls?  Wear shoes that:  Do not have high heels.  Have rubber bottoms.  Are comfortable and fit you well.  Are closed at the toe. Do not wear sandals.  If you use a stepladder:  Make sure that it is fully opened. Do not climb a closed stepladder.  Make sure that both sides of the stepladder are locked into place.  Ask someone to hold it for you, if possible.  Clearly mark and make sure that you can see:  Any grab bars or handrails.  First and last steps.  Where the edge of each step is.  Use tools that help you move around (mobility aids) if they are needed. These include:  Canes.  Walkers.  Scooters.  Crutches.  Turn on the lights when you go into a dark area. Replace any light bulbs as soon as they burn out.  Set up your furniture so you have a clear path. Avoid moving your furniture around.  If any of your floors are uneven, fix them.  If there are any pets around you, be aware of where they are.  Review your medicines with your doctor. Some medicines can make you feel dizzy. This can increase your chance of falling. Ask your doctor what other things that you can do to help prevent falls. This information is not intended to replace advice given to you by your health care provider. Make sure you discuss any questions you have with your health care provider. Document Released: 08/17/2009 Document Revised: 03/28/2016 Document Reviewed: 11/25/2014 Elsevier Interactive Patient Education  2017 [employer].

## 2018-05-22 DIAGNOSIS — H401132 Primary open-angle glaucoma, bilateral, moderate stage: Secondary | ICD-10-CM | POA: Diagnosis not present

## 2018-05-22 DIAGNOSIS — E119 Type 2 diabetes mellitus without complications: Secondary | ICD-10-CM | POA: Diagnosis not present

## 2018-05-22 LAB — HM DIABETES EYE EXAM

## 2018-05-25 ENCOUNTER — Ambulatory Visit
Admission: EM | Admit: 2018-05-25 | Discharge: 2018-05-25 | Disposition: A | Discharge status: Home / Self Care | Payer: Medicare Other | Attending: Family Medicine | Admitting: Family Medicine

## 2018-05-25 ENCOUNTER — Encounter: Payer: Self-pay | Admitting: Gynecology

## 2018-05-25 ENCOUNTER — Other Ambulatory Visit: Payer: Self-pay

## 2018-05-25 ENCOUNTER — Encounter: Payer: Self-pay | Admitting: Internal Medicine

## 2018-05-25 DIAGNOSIS — S90861A Insect bite (nonvenomous), right foot, initial encounter: Secondary | ICD-10-CM | POA: Diagnosis not present

## 2018-05-25 DIAGNOSIS — W57XXXA Bitten or stung by nonvenomous insect and other nonvenomous arthropods, initial encounter: Secondary | ICD-10-CM | POA: Diagnosis not present

## 2018-05-25 MED ORDER — DOXYCYCLINE HYCLATE 100 MG PO TABS: 100.0000 mg | ORAL_TABLET | Freq: Two times a day (BID) | ORAL | 0 refills | Status: DC

## 2018-05-25 NOTE — ED Triage Notes (Signed)
Per patient insect bite x 2 days ago at her right foot.

## 2018-05-25 NOTE — Discharge Instructions (Signed)
Warm compresses to area on foot Elevate leg

## 2018-05-25 NOTE — ED Provider Notes (Signed)
[facility]    CSN: [id number] Arrival date & time: 05/25/18  1108     History   Chief Complaint Chief Complaint  Patient presents with  . Insect Bite    HPI Cathy Murphy is a 77 y.o. female.   77 yo female with a c/o possible nsect bite to her right foot 3 days ago and now with redness, swelling and pain. Denies any drainage, fevers, chills.   The history is provided by the patient.    Past Medical History:  Diagnosis Date  . Allergy   . Anxiety   . Diabetes mellitus without complication ([place])   . Glaucoma   . Hyperlipidemia   . Hypertension     Patient Active Problem List   Diagnosis Date Noted  . Aortic calcification ([place]) 03/05/2018  . 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 Ventral hernia without obstruction or gangrene 03/06/2016  . Controlled type 2 diabetes mellitus without complication, with long-term current use of insulin ([place]) 11/09/2015  . Plantar fasciitis, right 06/16/2015  . Knee torn cartilage 04/26/2015  . Allergic rhinitis, seasonal 04/26/2015  . Essential (primary) hypertension 04/26/2015  . Glaucoma 04/26/2015  . Hyperlipidemia associated with type 2 diabetes mellitus ([place]) 04/26/2015  . Insomnia 04/26/2015    Past Surgical History:  Procedure Laterality Date  . ABDOMINAL HYSTERECTOMY    . COLONOSCOPY WITH PROPOFOL N/A 11/25/2017   Procedure: COLONOSCOPY WITH PROPOFOL;  Surgeon: [name], MD;  Location: [facility] ENDOSCOPY;  Service: Endoscopy;  Laterality: N/A;  . LIPOMA RESECTION     right posterior neck    OB History   None      Home Medications    Prior to Admission medications   Medication Sig Start Date End Date Taking? Authorizing Provider  amLODipine (NORVASC) 5 MG tablet TAKE 1 TABLET BY MOUTH EVERY DAY 12/30/17  Yes Glean Hess, MD  atorvastatin (LIPITOR) 80 MG tablet Take 1 tablet (80 mg total) by mouth daily. 11/21/17  Yes Glean Hess, MD  B-D UF III MINI PEN NEEDLES 31G X 5 MM MISC USE AS  DIRECTED WITH LEVEMIR 06/03/17  Yes Glean Hess, MD  busPIRone (BUSPAR) 7.5 MG tablet Take 7.5 mg by mouth 2 (two) times daily.   Yes [provider]  cholecalciferol (VITAMIN D) 1000 units tablet Take 2,000 Units by mouth daily.   Yes [provider]  glipiZIDE (GLUCOTROL) 5 MG tablet TAKE 1 TABLET BY MOUTH TWICE A DAY 03/13/18  Yes Glean Hess, MD  glucose blood (BAYER CONTOUR TEST) test strip Test once daily. 05/14/17  Yes Glean Hess, MD  Insulin Detemir (LEVEMIR FLEXTOUCH) 100 UNIT/ML Pen INJECT 24 UNITS UNDER THE SKIN EVERY DAY 02/25/18  Yes Glean Hess, MD  JANUVIA 100 MG tablet TAKE 1 TABLET BY MOUTH EVERY DAY 03/03/18  Yes Glean Hess, MD  lisinopril (PRINIVIL,ZESTRIL) 40 MG tablet TAKE 1 TABLET (40 MG TOTAL) BY MOUTH DAILY. 11/01/17  Yes Glean Hess, MD  doxycycline (VIBRA-TABS) 100 MG tablet Take 1 tablet (100 mg total) by mouth 2 (two) times daily. 05/25/18   Bengie Kaucher, Linward Foster, MD  LUMIGAN 0.01 % SOLN TAKE 1 DROP(S) IN BOTH EYES ONCE IN THE EVENING 03/01/15   [provider]  pioglitazone (ACTOS) 15 MG tablet Take 1 tablet (15 mg total) by mouth daily. 03/05/18   Glean Hess, MD  timolol (TIMOPTIC) 0.5 % ophthalmic solution Place 1 drop into both eyes. 02/15/16   [provider]  traMADol (ULTRAM) 50 MG tablet Take by mouth every 6 (six) hours as needed.    [provider]  traZODone (DESYREL) 50 MG tablet TAKE 1 TABLET BY MOUTH EVERYDAY AT BEDTIME 04/01/18   Glean Hess, MD    Family History Family History  Problem Relation Age of Onset  . Cancer Mother        Breast  . Heart disease Father   . Heart attack Brother   . Cancer Maternal Aunt        breast    Social History Social History   Tobacco Use  . Smoking status: Former Smoker    Packs/day: 1.00    Years: 7.00    Pack years: 7.00    Types: Cigarettes    Last attempt to quit: 1965    Years since quitting: 54.5  . Smokeless tobacco:  Never Used  . Tobacco comment: smoking cessation materials not required  Substance Use Topics  . Alcohol use: No  . Drug use: No     Allergies   Amoxicillin; Aspirin; Metformin and related; Oxycodone; and Sulfa antibiotics   Review of Systems Review of Systems   Physical Exam Triage Vital Signs ED Triage Vitals  Enc Vitals Group     BP 05/25/18 1157 130/66     Pulse Rate 05/25/18 1157 63     Resp 05/25/18 1157 16     Temp 05/25/18 1157 98 F (36.7 C)     Temp Source 05/25/18 1157 Oral     SpO2 05/25/18 1157 98 %     Weight 05/25/18 1155 179 lb (81.2 kg)     Height 05/25/18 1155 5\' 9"  (1.753 m)     Head Circumference --      Peak Flow --      Pain Score 05/25/18 1154 8     Pain Loc --      Pain Edu? --      Excl. in [place]? --    No data found.  Updated Vital Signs BP 130/66 (BP Location: Left Arm)   Pulse 63   Temp 98 F (36.7 C) (Oral)   Resp 16   Ht 5\' 9"  (1.753 m)   Wt 179 lb (81.2 kg)   SpO2 98%   BMI 26.43 kg/m   Visual Acuity Right Eye Distance:   Left Eye Distance:   Bilateral Distance:    Right Eye Near:   Left Eye Near:    Bilateral Near:     Physical Exam  Constitutional: She appears well-developed and well-nourished. No distress.  Skin: She is not diaphoretic. There is erythema.  Dorsum of right foot skin with warmth, blanchable erythema and tenderness to palpation; pinpoint puncture wound noted; no drainage or open sores  Nursing note and vitals reviewed.    UC Treatments / Results  Labs (all labs ordered are listed, but only abnormal results are displayed) Labs Reviewed - No data to display  EKG None  Radiology No results found.  Procedures Procedures (including critical care time)  Medications Ordered in UC Medications - No data to display  Initial Impression / Assessment and Plan / UC Course  I have reviewed the triage vital signs and the nursing notes.  Pertinent labs & imaging results that were available during my  care of the patient were reviewed by me and considered in my medical decision making (see chart for details).      Final Clinical Impressions(s) / UC Diagnoses   Final diagnoses:  Insect  bite of right foot, initial encounter     Discharge Instructions     Warm compresses to area on foot Elevate leg    ED Prescriptions    Medication Sig Dispense Auth. Provider   doxycycline (VIBRA-TABS) 100 MG tablet Take 1 tablet (100 mg total) by mouth 2 (two) times daily. 20 tablet Norval Gable, MD     1. diagnosis reviewed with patient 2. rx as per orders above; reviewed possible side effects, interactions, risks and benefits  3. Recommend supportive treatment as above 4. Follow-up prn if symptoms worsen or don't improve   Controlled Substance Prescriptions [place] Controlled Substance Registry consulted? Not Applicable   Norval Gable, MD 05/25/18 1242

## 2018-06-20 ENCOUNTER — Other Ambulatory Visit: Payer: Self-pay | Admitting: Internal Medicine

## 2018-06-29 ENCOUNTER — Other Ambulatory Visit: Payer: Self-pay | Admitting: Internal Medicine

## 2018-07-07 ENCOUNTER — Ambulatory Visit: Payer: Medicare Other | Admitting: Internal Medicine

## 2018-07-07 NOTE — Progress Notes (Deleted)
    Date:  07/07/2018   Name:  Cathy Murphy   DOB:  [birth year]   MRN:  [id number]   Chief Complaint: No chief complaint on file. Diabetes  She presents for her follow-up diabetic visit. She has type 2 diabetes mellitus. Her disease course has been stable. Current diabetic treatment includes insulin injections and oral agent (triple therapy) (glipizide, januvia, actos). She is compliant with treatment all of the time. An ACE inhibitor/angiotensin II receptor blocker is being taken.  Hypertension  This is a chronic problem. The problem is controlled. Past treatments include ACE inhibitors and calcium channel blockers. The current treatment provides significant improvement.  Hyperlipidemia  This is a chronic problem. The problem is controlled. Current antihyperlipidemic treatment includes statins.   Lab Results  Component Value Date   HGBA1C 7.2 (H) 03/05/2018   Lab Results  Component Value Date   CHOL 207 (H) 03/05/2018   HDL 55 03/05/2018   LDLCALC 135 (H) 03/05/2018   TRIG 87 03/05/2018   CHOLHDL 3.8 03/05/2018   Lab Results  Component Value Date   CREATININE 1.03 (H) 03/05/2018   BUN 16 03/05/2018   NA 143 03/05/2018   K 4.3 03/05/2018   CL 100 03/05/2018   CO2 23 03/05/2018      Review of Systems  Patient Active Problem List   Diagnosis Date Noted  . Aortic calcification ([place]) 03/05/2018  . 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 Ventral hernia without obstruction or gangrene 03/06/2016  . Controlled type 2 diabetes mellitus without complication, with long-term current use of insulin ([place]) 11/09/2015  . Plantar fasciitis, right 06/16/2015  . Knee torn cartilage 04/26/2015  . Allergic rhinitis, seasonal 04/26/2015  . Essential (primary) hypertension 04/26/2015  . Glaucoma 04/26/2015  . Hyperlipidemia associated with type 2 diabetes mellitus ([place]) 04/26/2015  . Insomnia 04/26/2015    Allergies  Allergen Reactions  . Amoxicillin Diarrhea  . Aspirin Nausea And  Vomiting  . Metformin And Related Diarrhea  . Oxycodone Other (See Comments)    CNS side effects/agitation  . Sulfa Antibiotics     Past Surgical History:  Procedure Laterality Date  . ABDOMINAL HYSTERECTOMY    . COLONOSCOPY WITH PROPOFOL N/A 11/25/2017   Procedure: COLONOSCOPY WITH PROPOFOL;  Surgeon: [name], MD;  Location: [facility] ENDOSCOPY;  Service: Endoscopy;  Laterality: N/A;  . LIPOMA RESECTION     right posterior neck    Social History   Tobacco Use  . Smoking status: Former Smoker    Packs/day: 1.00    Years: 7.00    Pack years: 7.00    Types: Cigarettes    Last attempt to quit: 1965    Years since quitting: 54.7  . Smokeless tobacco: Never Used  . Tobacco comment: smoking cessation materials not required  Substance Use Topics  . Alcohol use: No  . Drug use: No     Medication list has been reviewed and updated.  No outpatient medications have been marked as taking for the 07/07/18 encounter (Appointment) with Glean Hess, MD.    [facility] 2/9 Scores 04/15/2018 03/05/2018 04/07/2017 03/13/2017  PHQ - 2 Score 0 0 2 0  PHQ- 9 Score 0 - 2 -    Physical Exam  There were no vitals taken for this visit.  Assessment and Plan:

## 2018-07-15 ENCOUNTER — Encounter: Payer: Self-pay | Admitting: Internal Medicine

## 2018-07-15 ENCOUNTER — Ambulatory Visit (INDEPENDENT_AMBULATORY_CARE_PROVIDER_SITE_OTHER): Payer: Medicare Other | Admitting: Internal Medicine

## 2018-07-15 VITALS — BP 112/94 | HR 67 | Ht 69.0 in | Wt 174.0 lb

## 2018-07-15 DIAGNOSIS — E119 Type 2 diabetes mellitus without complications: Secondary | ICD-10-CM

## 2018-07-15 DIAGNOSIS — Z794 Long term (current) use of insulin: Secondary | ICD-10-CM | POA: Diagnosis not present

## 2018-07-15 DIAGNOSIS — M23206 Derangement of unspecified meniscus due to old tear or injury, right knee: Secondary | ICD-10-CM | POA: Diagnosis not present

## 2018-07-15 DIAGNOSIS — I10 Essential (primary) hypertension: Secondary | ICD-10-CM | POA: Diagnosis not present

## 2018-07-15 DIAGNOSIS — E1169 Type 2 diabetes mellitus with other specified complication: Secondary | ICD-10-CM | POA: Diagnosis not present

## 2018-07-15 DIAGNOSIS — E785 Hyperlipidemia, unspecified: Secondary | ICD-10-CM

## 2018-07-15 MED ORDER — MELOXICAM 15 MG PO TABS: 15.0000 mg | ORAL_TABLET | Freq: Every day | ORAL | 0 refills | Status: DC

## 2018-07-15 NOTE — Progress Notes (Signed)
Date:  07/15/2018   Name:  Cathy Murphy   DOB:  [birth year]   MRN:  [id number]   Chief Complaint: Hypertension and Diabetes Diabetes  She presents for her follow-up diabetic visit. She has type 2 diabetes mellitus. Her disease course has been stable. Pertinent negatives for hypoglycemia include no dizziness or headaches. Pertinent negatives for diabetes include no chest pain, no fatigue, no polydipsia and no polyuria. Her weight is stable.  Hypertension  This is a chronic problem. The problem is controlled. Pertinent negatives include no chest pain, headaches, palpitations or shortness of breath.  Hyperlipidemia  Pertinent negatives include no chest pain or shortness of breath. Current antihyperlipidemic treatment includes statins. There are no compliance problems.   Knee Pain   There was no injury mechanism. The pain is present in the right knee. The quality of the pain is described as aching. The pain is moderate. Pertinent negatives include no numbness.  She has seen Ortho in the past and had cortisone injections.  She is currently taking tylenol for the pain.  Lab Results  Component Value Date   HGBA1C 7.2 (H) 03/05/2018   Lab Results  Component Value Date   CREATININE 1.03 (H) 03/05/2018   BUN 16 03/05/2018   NA 143 03/05/2018   K 4.3 03/05/2018   CL 100 03/05/2018   CO2 23 03/05/2018   Lab Results  Component Value Date   CHOL 207 (H) 03/05/2018   HDL 55 03/05/2018   LDLCALC 135 (H) 03/05/2018   TRIG 87 03/05/2018   CHOLHDL 3.8 03/05/2018     Review of Systems  Constitutional: Negative for chills, fatigue and fever.  Respiratory: Negative for chest tightness, shortness of breath and wheezing.   Cardiovascular: Negative for chest pain and palpitations.  Endocrine: Negative for polydipsia and polyuria.  Genitourinary: Negative for dysuria.  Musculoskeletal: Positive for arthralgias and gait problem.  Allergic/Immunologic: Negative for environmental allergies.    Neurological: Negative for dizziness, light-headedness, numbness and headaches.  Psychiatric/Behavioral: Negative for dysphoric mood and sleep disturbance.    Patient Active Problem List   Diagnosis Date Noted  . Aortic calcification ([place]) 03/05/2018  . 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 Ventral hernia without obstruction or gangrene 03/06/2016  . Controlled type 2 diabetes mellitus without complication, with long-term current use of insulin ([place]) 11/09/2015  . Plantar fasciitis, right 06/16/2015  . Knee torn cartilage 04/26/2015  . Allergic rhinitis, seasonal 04/26/2015  . Essential (primary) hypertension 04/26/2015  . Glaucoma 04/26/2015  . Hyperlipidemia associated with type 2 diabetes mellitus ([place]) 04/26/2015  . Insomnia 04/26/2015    Allergies  Allergen Reactions  . Amoxicillin Diarrhea  . Aspirin Nausea And Vomiting  . Metformin And Related Diarrhea  . Oxycodone Other (See Comments)    CNS side effects/agitation  . Sulfa Antibiotics     Past Surgical History:  Procedure Laterality Date  . ABDOMINAL HYSTERECTOMY    . COLONOSCOPY WITH PROPOFOL N/A 11/25/2017   Procedure: COLONOSCOPY WITH PROPOFOL;  Surgeon: [name], MD;  Location: [facility] ENDOSCOPY;  Service: Endoscopy;  Laterality: N/A;  . LIPOMA RESECTION     right posterior neck    Social History   Tobacco Use  . Smoking status: Former Smoker    Packs/day: 1.00    Years: 7.00    Pack years: 7.00    Types: Cigarettes    Last attempt to quit: 1965    Years since quitting: 54.7  . Smokeless tobacco: Never Used  . Tobacco  comment: smoking cessation materials not required  Substance Use Topics  . Alcohol use: No  . Drug use: No     Medication list has been reviewed and updated.  Current Meds  Medication Sig  . amLODipine (NORVASC) 5 MG tablet TAKE 1 TABLET BY MOUTH EVERY DAY  . atorvastatin (LIPITOR) 80 MG tablet TAKE 1 TABLET BY MOUTH EVERY DAY  . B-D UF III MINI PEN NEEDLES 31G X 5 MM MISC  USE AS DIRECTED WITH LEVEMIR  . busPIRone (BUSPAR) 7.5 MG tablet Take 7.5 mg by mouth 2 (two) times daily.  . cholecalciferol (VITAMIN D) 1000 units tablet Take 2,000 Units by mouth daily.  Marland Kitchen glipiZIDE (GLUCOTROL) 5 MG tablet TAKE 1 TABLET BY MOUTH TWICE A DAY  . glucose blood (BAYER CONTOUR TEST) test strip Test once daily.  . Insulin Detemir (LEVEMIR FLEXTOUCH) 100 UNIT/ML Pen INJECT 24 UNITS UNDER THE SKIN EVERY DAY  . JANUVIA 100 MG tablet TAKE 1 TABLET BY MOUTH EVERY DAY  . lisinopril (PRINIVIL,ZESTRIL) 40 MG tablet TAKE 1 TABLET (40 MG TOTAL) BY MOUTH DAILY.  Marland Kitchen LUMIGAN 0.01 % SOLN TAKE 1 DROP(S) IN BOTH EYES ONCE IN THE EVENING  . pioglitazone (ACTOS) 15 MG tablet Take 1 tablet (15 mg total) by mouth daily.  . timolol (TIMOPTIC) 0.5 % ophthalmic solution Place 1 drop into both eyes.  . traMADol (ULTRAM) 50 MG tablet Take by mouth every 6 (six) hours as needed.  . traZODone (DESYREL) 50 MG tablet TAKE 1 TABLET BY MOUTH EVERYDAY AT BEDTIME    PHQ 2/9 Scores 04/15/2018 03/05/2018 04/07/2017 03/13/2017  PHQ - 2 Score 0 0 2 0  PHQ- 9 Score 0 - 2 -    Physical Exam  Constitutional: She is oriented to person, place, and time. She appears well-developed. No distress.  HENT:  Head: Normocephalic and atraumatic.  Neck: Normal range of motion. Neck supple.  Cardiovascular: Normal rate, regular rhythm and normal heart sounds.  Pulmonary/Chest: Effort normal and breath sounds normal. No respiratory distress.  Abdominal: Soft.  Musculoskeletal: She exhibits no edema.       Right knee: She exhibits decreased range of motion and effusion. Tenderness found.       Left knee: She exhibits decreased range of motion.  No trigger point tenderness  Neurological: She is alert and oriented to person, place, and time.  Skin: Skin is warm and dry. No rash noted.  Psychiatric: She has a normal mood and affect. Her behavior is normal. Thought content normal.  Nursing note and vitals reviewed.   BP (!)  112/94 (BP Location: Right Arm, Patient Position: Sitting, Cuff Size: Normal)   Pulse 67   Ht 5\' 9"  (1.753 m)   Wt 174 lb (78.9 kg)   SpO2 99%   BMI 25.70 kg/m   Assessment and Plan: 1. Controlled type 2 diabetes mellitus without complication, with long-term current use of insulin (HCC) Controlled, continue current therayp - Hemoglobin A1c  2. Essential (primary) hypertension controlled  3. Hyperlipidemia associated with type 2 diabetes mellitus (HCC) On max dose statin therapy  4. Old tear of meniscus of right knee, unspecified meniscus, unspecified tear type Recommend follow up with Ortho - meloxicam (MOBIC) 15 MG tablet; Take 1 tablet (15 mg total) by mouth daily.  Dispense: 30 tablet; Refill: 0   Meds ordered this encounter  Medications  . meloxicam (MOBIC) 15 MG tablet    Sig: Take 1 tablet (15 mg total) by mouth daily.    Dispense:  30 tablet    Refill:  0    Partially dictated using Editor, commissioning. Any errors are unintentional.  Halina Maidens, MD [facility]  07/15/2018

## 2018-07-16 LAB — HEMOGLOBIN A1C
ESTIMATED AVERAGE GLUCOSE: 169 mg/dL
HEMOGLOBIN A1C: 7.5 % — AB (ref 4.8–5.6)

## 2018-08-06 ENCOUNTER — Other Ambulatory Visit: Payer: Self-pay | Admitting: Internal Medicine

## 2018-08-06 DIAGNOSIS — I10 Essential (primary) hypertension: Secondary | ICD-10-CM

## 2018-10-08 ENCOUNTER — Ambulatory Visit
Admission: RE | Admit: 2018-10-08 | Discharge: 2018-10-08 | Disposition: A | Discharge status: Home / Self Care | Payer: Medicare Other | Source: Ambulatory Visit | Attending: Internal Medicine | Admitting: Internal Medicine

## 2018-10-08 ENCOUNTER — Encounter: Payer: Self-pay | Admitting: Internal Medicine

## 2018-10-08 ENCOUNTER — Ambulatory Visit (INDEPENDENT_AMBULATORY_CARE_PROVIDER_SITE_OTHER): Payer: Medicare Other | Admitting: Internal Medicine

## 2018-10-08 VITALS — BP 134/72 | HR 73 | Ht 69.0 in | Wt 175.6 lb

## 2018-10-08 DIAGNOSIS — M5030 Other cervical disc degeneration, unspecified cervical region: Secondary | ICD-10-CM | POA: Diagnosis not present

## 2018-10-08 DIAGNOSIS — M542 Cervicalgia: Secondary | ICD-10-CM | POA: Diagnosis not present

## 2018-10-08 DIAGNOSIS — M23206 Derangement of unspecified meniscus due to old tear or injury, right knee: Secondary | ICD-10-CM

## 2018-10-08 MED ORDER — MELOXICAM 15 MG PO TABS: 15.0000 mg | ORAL_TABLET | Freq: Every day | ORAL | 0 refills | Status: DC

## 2018-10-08 MED ORDER — CYCLOBENZAPRINE HCL 5 MG PO TABS: 5.0000 mg | ORAL_TABLET | Freq: Every day | ORAL | 0 refills | Status: DC

## 2018-10-08 NOTE — Progress Notes (Signed)
Date:  10/08/2018   Name:  Cathy Murphy   DOB:  [birth year]   MRN:  [id number]   Chief Complaint: Neck Pain (Mentioned this at last visit. In the last few weeks, getting worse. Feels like may have slept on it wrong. When trying to turn neck, she can feel bones popping. Left side.)  Neck Pain   This is a chronic problem. The current episode started more than 1 month ago. The problem occurs daily. The problem has been gradually worsening. The pain is associated with nothing. The pain is present in the left side. The quality of the pain is described as cramping. The pain is moderate. The symptoms are aggravated by twisting. Pertinent negatives include no chest pain, fever, headaches, tingling, trouble swallowing or weakness. Treatments tried: topical agents.  Knee pain - much better after mobic but still hurts to walk long distance.  Would like handicapped parking.  Review of Systems  Constitutional: Negative for chills, fatigue and fever.  HENT: Negative for trouble swallowing.   Respiratory: Negative for cough and shortness of breath.   Cardiovascular: Negative for chest pain and palpitations.  Musculoskeletal: Positive for arthralgias, gait problem (knee meniscus derangement) and neck pain.  Neurological: Negative for dizziness, tingling, tremors, weakness and headaches.    Patient Active Problem List   Diagnosis Date Noted  . Aortic calcification ([place]) 03/05/2018  . 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 Ventral hernia without obstruction or gangrene 03/06/2016  . Controlled type 2 diabetes mellitus without complication, with long-term current use of insulin ([place]) 11/09/2015  . Plantar fasciitis, right 06/16/2015  . Knee torn cartilage 04/26/2015  . Allergic rhinitis, seasonal 04/26/2015  . Essential (primary) hypertension 04/26/2015  . Glaucoma 04/26/2015  . Hyperlipidemia associated with type 2 diabetes mellitus ([place]) 04/26/2015  . Insomnia 04/26/2015    Allergies  Allergen  Reactions  . Amoxicillin Diarrhea  . Aspirin Nausea And Vomiting  . Metformin And Related Diarrhea  . Oxycodone Other (See Comments)    CNS side effects/agitation  . Sulfa Antibiotics     Past Surgical History:  Procedure Laterality Date  . ABDOMINAL HYSTERECTOMY    . COLONOSCOPY WITH PROPOFOL N/A 11/25/2017   Procedure: COLONOSCOPY WITH PROPOFOL;  Surgeon: [name], MD;  Location: [facility] ENDOSCOPY;  Service: Endoscopy;  Laterality: N/A;  . LIPOMA RESECTION     right posterior neck    Social History   Tobacco Use  . Smoking status: Former Smoker    Packs/day: 1.00    Years: 7.00    Pack years: 7.00    Types: Cigarettes    Last attempt to quit: 1965    Years since quitting: 54.9  . Smokeless tobacco: Never Used  . Tobacco comment: smoking cessation materials not required  Substance Use Topics  . Alcohol use: No  . Drug use: No     Medication list has been reviewed and updated.  Current Meds  Medication Sig  . amLODipine (NORVASC) 5 MG tablet TAKE 1 TABLET BY MOUTH EVERY DAY  . atorvastatin (LIPITOR) 80 MG tablet TAKE 1 TABLET BY MOUTH EVERY DAY  . B-D UF III MINI PEN NEEDLES 31G X 5 MM MISC USE AS DIRECTED WITH LEVEMIR  . busPIRone (BUSPAR) 7.5 MG tablet Take 7.5 mg by mouth 2 (two) times daily.  . cholecalciferol (VITAMIN D) 1000 units tablet Take 2,000 Units by mouth daily.  Marland Kitchen glipiZIDE (GLUCOTROL) 5 MG tablet TAKE 1 TABLET BY MOUTH TWICE A DAY  . glucose  blood (BAYER CONTOUR TEST) test strip Test once daily.  . Insulin Detemir (LEVEMIR FLEXTOUCH) 100 UNIT/ML Pen INJECT 24 UNITS UNDER THE SKIN EVERY DAY  . JANUVIA 100 MG tablet TAKE 1 TABLET BY MOUTH EVERY DAY  . lisinopril (PRINIVIL,ZESTRIL) 40 MG tablet TAKE 1 TABLET BY MOUTH EVERY DAY  . LUMIGAN 0.01 % SOLN TAKE 1 DROP(S) IN BOTH EYES ONCE IN THE EVENING  . pioglitazone (ACTOS) 15 MG tablet Take 1 tablet (15 mg total) by mouth daily.  . timolol (TIMOPTIC) 0.5 % ophthalmic solution Place 1 drop into both  eyes.  . traMADol (ULTRAM) 50 MG tablet Take by mouth every 6 (six) hours as needed.  . traZODone (DESYREL) 50 MG tablet TAKE 1 TABLET BY MOUTH EVERYDAY AT BEDTIME    PHQ 2/9 Scores 04/15/2018 03/05/2018 04/07/2017 03/13/2017  PHQ - 2 Score 0 0 2 0  PHQ- 9 Score 0 - 2 -    Physical Exam  Constitutional: She is oriented to person, place, and time. She appears well-developed. No distress.  HENT:  Head: Normocephalic and atraumatic.  Cardiovascular: Normal rate, regular rhythm and normal heart sounds.  Pulmonary/Chest: Effort normal and breath sounds normal. No respiratory distress.  Musculoskeletal:       Cervical back: She exhibits decreased range of motion, tenderness, bony tenderness and spasm.  Neurological: She is alert and oriented to person, place, and time. She has normal strength. No sensory deficit.  Reflex Scores:      Bicep reflexes are 2+ on the right side and 2+ on the left side.      Patellar reflexes are 2+ on the right side and 2+ on the left side. Skin: Skin is warm and dry. No rash noted.  Psychiatric: She has a normal mood and affect. Her behavior is normal. Thought content normal.  Nursing note and vitals reviewed.   BP 134/72 (BP Location: Right Arm, Patient Position: Sitting, Cuff Size: Normal)   Pulse 73   Ht 5\' 9"  (1.753 m)   Wt 175 lb 9.6 oz (79.7 kg)   SpO2 100%   BMI 25.93 kg/m   Assessment and Plan: 1. Bilateral neck pain Begin Mobic and flexeril Continue topical pain rubs - DG Cervical Spine Complete; Future - cyclobenzaprine (FLEXERIL) 5 MG tablet; Take 1-2 tablets (5-10 mg total) by mouth at bedtime.  Dispense: 30 tablet; Refill: 0  2. Old tear of meniscus of right knee, unspecified meniscus, unspecified tear type Handicapped parking application given - meloxicam (MOBIC) 15 MG tablet; Take 1 tablet (15 mg total) by mouth daily.  Dispense: 30 tablet; Refill: 0   Partially dictated using Editor, commissioning. Any errors are unintentional.  Halina Maidens, MD [facility]  10/08/2018

## 2018-10-21 ENCOUNTER — Other Ambulatory Visit: Payer: Self-pay | Admitting: Internal Medicine

## 2018-10-26 NOTE — Telephone Encounter (Signed)
Called patient LM to schedule DM visit

## 2018-10-31 ENCOUNTER — Other Ambulatory Visit: Payer: Self-pay | Admitting: Internal Medicine

## 2018-11-06 ENCOUNTER — Ambulatory Visit (INDEPENDENT_AMBULATORY_CARE_PROVIDER_SITE_OTHER): Payer: Medicare Other | Admitting: Internal Medicine

## 2018-11-06 ENCOUNTER — Encounter: Payer: Self-pay | Admitting: Internal Medicine

## 2018-11-06 VITALS — BP 121/72 | HR 74 | Resp 16 | Ht 69.0 in | Wt 175.0 lb

## 2018-11-06 DIAGNOSIS — I10 Essential (primary) hypertension: Secondary | ICD-10-CM

## 2018-11-06 DIAGNOSIS — Z794 Long term (current) use of insulin: Secondary | ICD-10-CM | POA: Diagnosis not present

## 2018-11-06 DIAGNOSIS — Z6824 Body mass index (BMI) 24.0-24.9, adult: Secondary | ICD-10-CM | POA: Insufficient documentation

## 2018-11-06 DIAGNOSIS — E119 Type 2 diabetes mellitus without complications: Secondary | ICD-10-CM | POA: Diagnosis not present

## 2018-11-06 NOTE — Progress Notes (Signed)
Date:  11/06/2018   Name:  Cathy Murphy   DOB:  11-09-1940   MRN:  [id number]   Chief Complaint: Diabetes (BS Range 91-100)  Diabetes  She presents for her follow-up diabetic visit. She has type 2 diabetes mellitus. Her disease course has been stable. Pertinent negatives for hypoglycemia include no headaches, nervousness/anxiousness or tremors. Pertinent negatives for diabetes include no chest pain, no fatigue, no polydipsia and no polyuria. Current diabetic treatment includes oral agent (triple therapy) and insulin injections (actos, januvia, glipizide). She is compliant with treatment all of the time. An ACE inhibitor/angiotensin II receptor blocker is being taken.  Hypertension  The problem is controlled. Pertinent negatives include no chest pain, headaches, palpitations or shortness of breath. Past treatments include ACE inhibitors and calcium channel blockers. The current treatment provides significant improvement. There are no compliance problems.    Lab Results  Component Value Date   HGBA1C 7.5 (H) 07/15/2018   Lab Results  Component Value Date   CREATININE 1.03 (H) 03/05/2018   BUN 16 03/05/2018   NA 143 03/05/2018   K 4.3 03/05/2018   CL 100 03/05/2018   CO2 23 03/05/2018     Review of Systems  Constitutional: Negative for appetite change, fatigue, fever and unexpected weight change.  HENT: Negative for tinnitus and trouble swallowing.   Eyes: Negative for visual disturbance.  Respiratory: Negative for cough, chest tightness and shortness of breath.   Cardiovascular: Negative for chest pain, palpitations and leg swelling.  Gastrointestinal: Negative for abdominal pain.  Endocrine: Negative for polydipsia and polyuria.  Genitourinary: Negative for dysuria and hematuria.  Musculoskeletal: Positive for neck stiffness (improved with flexeril). Negative for arthralgias.  Neurological: Negative for tremors, numbness and headaches.  Psychiatric/Behavioral: Negative for  dysphoric mood and sleep disturbance. The patient is not nervous/anxious.     Patient Active Problem List   Diagnosis Date Noted  . BMI 24.0-24.9, adult 11/06/2018  . Bilateral neck pain 10/08/2018  . Aortic calcification ([place]) 03/05/2018  . 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 Ventral hernia without obstruction or gangrene 03/06/2016  . Controlled type 2 diabetes mellitus without complication, with long-term current use of insulin ([place]) 11/09/2015  . Plantar fasciitis, right 06/16/2015  . Knee torn cartilage 04/26/2015  . Allergic rhinitis, seasonal 04/26/2015  . Essential (primary) hypertension 04/26/2015  . Glaucoma 04/26/2015  . Hyperlipidemia associated with type 2 diabetes mellitus ([place]) 04/26/2015  . Insomnia 04/26/2015    Allergies  Allergen Reactions  . Amoxicillin Diarrhea  . Aspirin Nausea And Vomiting  . Metformin And Related Diarrhea  . Oxycodone Other (See Comments)    CNS side effects/agitation  . Sulfa Antibiotics     Past Surgical History:  Procedure Laterality Date  . ABDOMINAL HYSTERECTOMY    . COLONOSCOPY WITH PROPOFOL N/A 11/25/2017   Procedure: COLONOSCOPY WITH PROPOFOL;  Surgeon: [name], MD;  Location: [facility] ENDOSCOPY;  Service: Endoscopy;  Laterality: N/A;  . LIPOMA RESECTION     right posterior neck    Social History   Tobacco Use  . Smoking status: Former Smoker    Packs/day: 1.00    Years: 7.00    Pack years: 7.00    Types: Cigarettes    Last attempt to quit: 1965    Years since quitting: 55.0  . Smokeless tobacco: Never Used  . Tobacco comment: smoking cessation materials not required  Substance Use Topics  . Alcohol use: No  . Drug use: No     Medication  list has been reviewed and updated.  Current Meds  Medication Sig  . amLODipine (NORVASC) 5 MG tablet TAKE 1 TABLET BY MOUTH EVERY DAY  . atorvastatin (LIPITOR) 80 MG tablet TAKE 1 TABLET BY MOUTH EVERY DAY  . B-D UF III MINI PEN NEEDLES 31G X 5 MM MISC USE AS  DIRECTED WITH LEVEMIR  . busPIRone (BUSPAR) 7.5 MG tablet Take 7.5 mg by mouth 2 (two) times daily.  . cholecalciferol (VITAMIN D) 1000 units tablet Take 2,000 Units by mouth daily.  . cyclobenzaprine (FLEXERIL) 5 MG tablet Take 1-2 tablets (5-10 mg total) by mouth at bedtime.  Marland Kitchen glipiZIDE (GLUCOTROL) 5 MG tablet TAKE 1 TABLET BY MOUTH TWICE A DAY  . glucose blood (BAYER CONTOUR TEST) test strip Test once daily.  . Insulin Detemir (LEVEMIR) 100 UNIT/ML Pen INJECT 24 UNITS UNDER THE SKIN EVERY DAY  . JANUVIA 100 MG tablet TAKE 1 TABLET BY MOUTH EVERY DAY  . lisinopril (PRINIVIL,ZESTRIL) 40 MG tablet TAKE 1 TABLET BY MOUTH EVERY DAY  . LUMIGAN 0.01 % SOLN TAKE 1 DROP(S) IN BOTH EYES ONCE IN THE EVENING  . meloxicam (MOBIC) 15 MG tablet Take 1 tablet (15 mg total) by mouth daily.  . pioglitazone (ACTOS) 15 MG tablet Take 1 tablet (15 mg total) by mouth daily.  . timolol (TIMOPTIC) 0.5 % ophthalmic solution Place 1 drop into both eyes.  . traMADol (ULTRAM) 50 MG tablet Take by mouth every 6 (six) hours as needed.  . traZODone (DESYREL) 50 MG tablet TAKE 1 TABLET BY MOUTH EVERYDAY AT BEDTIME    PHQ 2/9 Scores 11/06/2018 04/15/2018 03/05/2018 04/07/2017  PHQ - 2 Score 0 0 0 2  PHQ- 9 Score - 0 - 2    Physical Exam Vitals signs and nursing note reviewed.  Constitutional:      General: She is not in acute distress.    Appearance: She is well-developed.  HENT:     Head: Normocephalic and atraumatic.     Nose: Nose normal.  Neck:     Musculoskeletal: Normal range of motion and neck supple.  Cardiovascular:     Rate and Rhythm: Normal rate and regular rhythm.     Pulses: Normal pulses.  Pulmonary:     Effort: Pulmonary effort is normal. No respiratory distress.     Breath sounds: Normal breath sounds.  Musculoskeletal: Normal range of motion.  Skin:    General: Skin is warm and dry.     Findings: No rash.  Neurological:     Mental Status: She is alert and oriented to person, place, and time.   Psychiatric:        Behavior: Behavior normal.        Thought Content: Thought content normal.    Wt Readings from Last 3 Encounters:  11/06/18 175 lb (79.4 kg)  10/08/18 175 lb 9.6 oz (79.7 kg)  07/15/18 174 lb (78.9 kg)     BP 121/72   Pulse 74   Resp 16   Ht 5\' 9"  (1.753 m)   Wt 175 lb (79.4 kg)   SpO2 100%   BMI 25.84 kg/m   Assessment and Plan: 1. Controlled type 2 diabetes mellitus without complication, with long-term current use of insulin (HCC) Controlled, continue current therapy - Hemoglobin A1c - Comprehensive metabolic panel  2. Essential (primary) hypertension controlled  3. BMI 24.0-24.9, adult stable   Partially dictated using Editor, commissioning. Any errors are unintentional.  Halina Maidens, MD [facility]  11/06/2018

## 2018-11-07 LAB — COMPREHENSIVE METABOLIC PANEL
ALBUMIN: 4.4 g/dL (ref 3.5–4.8)
ALT: 12 IU/L (ref 0–32)
AST: 13 IU/L (ref 0–40)
Albumin/Globulin Ratio: 1.8 (ref 1.2–2.2)
Alkaline Phosphatase: 79 IU/L (ref 39–117)
BUN/Creatinine Ratio: 16 (ref 12–28)
BUN: 18 mg/dL (ref 8–27)
Bilirubin Total: 1.3 mg/dL — ABNORMAL HIGH (ref 0.0–1.2)
CO2: 26 mmol/L (ref 20–29)
Calcium: 9.5 mg/dL (ref 8.7–10.3)
Chloride: 103 mmol/L (ref 96–106)
Creatinine, Ser: 1.13 mg/dL — ABNORMAL HIGH (ref 0.57–1.00)
GFR calc Af Amer: 55 mL/min/{1.73_m2} — ABNORMAL LOW (ref >59)
GFR calc non Af Amer: 48 mL/min/{1.73_m2} — ABNORMAL LOW (ref >59)
Globulin, Total: 2.5 g/dL (ref 1.5–4.5)
Glucose: 100 mg/dL — ABNORMAL HIGH (ref 65–99)
Potassium: 4 mmol/L (ref 3.5–5.2)
Sodium: 143 mmol/L (ref 134–144)
Total Protein: 6.9 g/dL (ref 6.0–8.5)

## 2018-11-07 LAB — HEMOGLOBIN A1C
Est. average glucose Bld gHb Est-mCnc: 160 mg/dL
HEMOGLOBIN A1C: 7.2 % — AB (ref 4.8–5.6)

## 2018-11-13 DIAGNOSIS — H401132 Primary open-angle glaucoma, bilateral, moderate stage: Secondary | ICD-10-CM | POA: Diagnosis not present

## 2018-11-29 ENCOUNTER — Other Ambulatory Visit: Payer: Self-pay | Admitting: Internal Medicine

## 2019-01-05 ENCOUNTER — Other Ambulatory Visit: Payer: Self-pay

## 2019-01-05 ENCOUNTER — Encounter: Payer: Self-pay | Admitting: Emergency Medicine

## 2019-01-05 ENCOUNTER — Ambulatory Visit
Admission: EM | Admit: 2019-01-05 | Discharge: 2019-01-05 | Disposition: A | Discharge status: Home / Self Care | Payer: Medicare Other | Attending: Family Medicine | Admitting: Family Medicine

## 2019-01-05 DIAGNOSIS — J0110 Acute frontal sinusitis, unspecified: Secondary | ICD-10-CM | POA: Diagnosis not present

## 2019-01-05 DIAGNOSIS — J0100 Acute maxillary sinusitis, unspecified: Secondary | ICD-10-CM | POA: Diagnosis not present

## 2019-01-05 MED ORDER — DOXYCYCLINE HYCLATE 100 MG PO CAPS: 100.0000 mg | ORAL_CAPSULE | Freq: Two times a day (BID) | ORAL | 0 refills | Status: DC

## 2019-01-05 MED ORDER — FLUTICASONE PROPIONATE 50 MCG/ACT NA SUSP: 1.0000 | Freq: Every day | NASAL | 0 refills | Status: DC

## 2019-01-05 NOTE — ED Provider Notes (Addendum)
[facility] ____________________________________________  Time seen: Approximately 12:31 PM  I have reviewed the triage vital signs and the nursing notes.   HISTORY  Chief Complaint Headache and Nasal Congestion  HPI Cathy Murphy is a 78 y.o. female presenting for evaluation of nasal congestion, nasal drainage and sinus pressure present for approximately 1 month.  Patient states that she does have a history of recurrent seasonal allergies, and states that she has been dealing with this during this timeframe.  States the sinus pressure has been gradually increasing over the last several weeks.  Has been getting a lot of nasal drainage out as well as postnasal drainage.  States feels pressure on her cheeks and in her forehead.  Some sneezing.  History of similar sensation in the past.  No known fevers.  Denies chest pain or shortness of breath.  States sinus pressure has been increasing more over the last 2 days and she has been taking Benadryl at night without much change.  Denies other alleviating measures attempted.  States today she noticed some brief dizziness with movement, described as "swimmy headed" but not like her previous vertigo.  Denies current dizziness.  Does also feel fluid in her ears.  No hearing changes.  No fall or trauma.  Denies paresthesias, unilateral weakness, fatigue, unsteady gait, chest pain, shortness of breath.  Denies recent sickness.  Denies recent antibiotic use.  Glean Hess, MD: PCP    Past Medical History:  Diagnosis Date  . Allergy   . Anxiety   . Diabetes mellitus without complication ([place])   . Glaucoma   . Hyperlipidemia   . Hypertension     Patient Active Problem List   Diagnosis Date Noted  . BMI 24.0-24.9, adult 11/06/2018  . Bilateral neck pain 10/08/2018  . Aortic calcification ([place]) 03/05/2018  . 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 Ventral hernia without obstruction or gangrene 03/06/2016  . Controlled type 2  diabetes mellitus without complication, with long-term current use of insulin ([place]) 11/09/2015  . Plantar fasciitis, right 06/16/2015  . Knee torn cartilage 04/26/2015  . Allergic rhinitis, seasonal 04/26/2015  . Essential (primary) hypertension 04/26/2015  . Glaucoma 04/26/2015  . Hyperlipidemia associated with type 2 diabetes mellitus ([place]) 04/26/2015  . Insomnia 04/26/2015    Past Surgical History:  Procedure Laterality Date  . ABDOMINAL HYSTERECTOMY    . COLONOSCOPY WITH PROPOFOL N/A 11/25/2017   Procedure: COLONOSCOPY WITH PROPOFOL;  Surgeon: [name], MD;  Location: [facility] ENDOSCOPY;  Service: Endoscopy;  Laterality: N/A;  . LIPOMA RESECTION     right posterior neck     No current facility-administered medications for this encounter.   Current Outpatient Medications:  .  amLODipine (NORVASC) 5 MG tablet, TAKE 1 TABLET BY MOUTH EVERY DAY, Disp: 90 tablet, Rfl: 3 .  atorvastatin (LIPITOR) 80 MG tablet, TAKE 1 TABLET BY MOUTH EVERY DAY, Disp: 90 tablet, Rfl: 0 .  B-D UF III MINI PEN NEEDLES 31G X 5 MM MISC, USE AS DIRECTED WITH LEVEMIR, Disp: 100 each, Rfl: 3 .  cholecalciferol (VITAMIN D) 1000 units tablet, Take 2,000 Units by mouth daily., Disp: , Rfl:  .  glipiZIDE (GLUCOTROL) 5 MG tablet, TAKE 1 TABLET BY MOUTH TWICE A DAY, Disp: 60 tablet, Rfl: 9 .  glucose blood (BAYER CONTOUR TEST) test strip, Test once daily., Disp: 100 each, Rfl: 12 .  Insulin Detemir (LEVEMIR) 100 UNIT/ML Pen, INJECT 24 UNITS UNDER THE SKIN EVERY DAY, Disp: 15 pen, Rfl: 3 .  JANUVIA 100 MG tablet, TAKE 1 TABLET BY MOUTH EVERY DAY, Disp: 30 tablet, Rfl: 11 .  lisinopril (PRINIVIL,ZESTRIL) 40 MG tablet, TAKE 1 TABLET BY MOUTH EVERY DAY, Disp: 90 tablet, Rfl: 3 .  LUMIGAN 0.01 % SOLN, TAKE 1 DROP(S) IN BOTH EYES ONCE IN THE EVENING, Disp: , Rfl: 5 .  pioglitazone (ACTOS) 15 MG tablet, Take 1 tablet (15 mg total) by mouth daily., Disp: 90 tablet, Rfl: 3 .  timolol (TIMOPTIC) 0.5 % ophthalmic  solution, Place 1 drop into both eyes., Disp: , Rfl: 5 .  traZODone (DESYREL) 50 MG tablet, TAKE 1 TABLET BY MOUTH EVERYDAY AT BEDTIME, Disp: 90 tablet, Rfl: 3 .  doxycycline (VIBRAMYCIN) 100 MG capsule, Take 1 capsule (100 mg total) by mouth 2 (two) times daily., Disp: 20 capsule, Rfl: 0 .  fluticasone (FLONASE) 50 MCG/ACT nasal spray, Place 1 spray into both nostrils daily., Disp: 1 g, Rfl: 0  Allergies Amoxicillin; Aspirin; Metformin and related; Oxycodone; and Sulfa antibiotics  Family History  Problem Relation Age of Onset  . Cancer Mother        Breast  . Heart disease Father   . Heart attack Brother   . Cancer Maternal Aunt        breast    Social History Social History   Tobacco Use  . Smoking status: Former Smoker    Packs/day: 1.00    Years: 7.00    Pack years: 7.00    Types: Cigarettes    Last attempt to quit: 1965    Years since quitting: 55.2  . Smokeless tobacco: Never Used  . Tobacco comment: smoking cessation materials not required  Substance Use Topics  . Alcohol use: No  . Drug use: No    Review of Systems Constitutional: No fever Eyes: No visual changes. ENT: No sore throat.  Positive nasal congestion and postnasal drainage. Cardiovascular: Denies chest pain. Respiratory: Denies shortness of breath. Gastrointestinal: No abdominal pain.  No nausea, no vomiting.  No diarrhea.  No constipation. Genitourinary: Negative for dysuria. Musculoskeletal: Negative for back pain. Skin: Negative for rash. Neurological: Negative for headaches, focal weakness or numbness.    ____________________________________________   PHYSICAL EXAM:  VITAL SIGNS: ED Triage Vitals  Enc Vitals Group     BP 01/05/19 1130 140/62     Pulse Rate 01/05/19 1130 81     Resp 01/05/19 1130 18     Temp 01/05/19 1130 98.2 F (36.8 C)     Temp Source 01/05/19 1130 Oral     SpO2 01/05/19 1130 100 %     Weight 01/05/19 1131 174 lb (78.9 kg)     Height 01/05/19 1131 5\' 9"   (1.753 m)     Head Circumference --      Peak Flow --      Pain Score 01/05/19 1130 7     Pain Loc --      Pain Edu? --      Excl. in [place]? --     Constitutional: Alert and oriented. Well appearing and in no acute distress. Eyes: Conjunctivae are normal. PERRL. EOMI. Head: Atraumatic.Mild to moderate tenderness to palpation bilateral frontal and maxillary sinuses. No swelling. No erythema.   Ears: Left: Nontender, normal canal, no erythema, mild effusion, otherwise normal TM.  Right: Nontender, normal canal, no erythema, normal TM.  Nose: nasal congestion with bilateral nasal turbinate erythema and edema.   Mouth/Throat: Mucous membranes are moist.  Oropharynx non-erythematous.No tonsillar swelling or exudate.  Neck: No stridor.  No cervical spine tenderness to palpation. Hematological/Lymphatic/Immunilogical: No cervical lymphadenopathy. Cardiovascular: Normal rate, regular rhythm. Grossly normal heart sounds.  Good peripheral circulation. Respiratory: Normal respiratory effort.  No retractions. No wheezes, rales or rhonchi. Good air movement.  Gastrointestinal: Soft and nontender. No distention. Normal Bowel sounds. No CVA tenderness. Musculoskeletal:No cervical, thoracic or lumbar tenderness to palpation.  Neurologic:  Normal speech and language. No gross focal neurologic deficits are appreciated. No gait instability.  No ataxia.  Negative pronator drift.  Negative Romberg.  No paresthesias. Skin:  Skin is warm, dry and intact. No rash noted. Psychiatric: Mood and affect are normal. Speech and behavior are normal.  ___________________________________________   LABS (all labs ordered are listed, but only abnormal results are displayed)  Labs Reviewed - No data to display  PROCEDURES Procedures   INITIAL IMPRESSION / ASSESSMENT AND PLAN / ED COURSE  Pertinent labs & imaging results that were available during my care of the patient were reviewed by me and considered in my medical  decision making (see chart for details).  Well-appearing patient.  No acute distress.  No focal neurological deficits.  Suspect sinusitis.  Will treat with doxycycline and Flonase.  Fluids, supportive care.  Discussed follow-up with primary care this week.  Discussed very strict follow-up and return parameters, including proceed directly to the emergency room for worsening complaints.  Discussed follow up and return parameters including no resolution or any worsening concerns. Patient verbalized understanding and agreed to plan.   ____________________________________________   FINAL CLINICAL IMPRESSION(S) / ED DIAGNOSES  Final diagnoses:  Acute frontal sinusitis, recurrence not specified  Acute maxillary sinusitis, recurrence not specified     ED Discharge Orders         Ordered    doxycycline (VIBRAMYCIN) 100 MG capsule  2 times daily     01/05/19 1216    fluticasone (FLONASE) 50 MCG/ACT nasal spray  Daily     01/05/19 1216           Note: This dictation was prepared with Dragon dictation along with smaller phrase technology. Any transcriptional errors that result from this process are unintentional.         Marylene Land, NP 01/05/19 1247    Marylene Land, NP 01/05/19 1248

## 2019-01-05 NOTE — Discharge Instructions (Addendum)
Take medication as prescribed. Rest. Drink plenty of fluids.   Follow up with your primary care physician this week. Return to Urgent care as needed. Proceed directly to the emergency room for headache, dizziness, weakness, or worsening concerns.

## 2019-01-05 NOTE — ED Triage Notes (Signed)
Patient in today c/o headache, runny nose, sneezing x >1 month. Patient states she was dizzy today. Patient has been taking Benadryl without relief.

## 2019-01-20 DIAGNOSIS — E1169 Type 2 diabetes mellitus with other specified complication: Secondary | ICD-10-CM | POA: Diagnosis not present

## 2019-01-20 DIAGNOSIS — M1711 Unilateral primary osteoarthritis, right knee: Secondary | ICD-10-CM | POA: Diagnosis not present

## 2019-01-20 DIAGNOSIS — G8929 Other chronic pain: Secondary | ICD-10-CM | POA: Diagnosis not present

## 2019-01-20 DIAGNOSIS — M25551 Pain in right hip: Secondary | ICD-10-CM | POA: Diagnosis not present

## 2019-01-20 DIAGNOSIS — M25561 Pain in right knee: Secondary | ICD-10-CM | POA: Diagnosis not present

## 2019-02-18 ENCOUNTER — Encounter: Payer: Self-pay | Admitting: Internal Medicine

## 2019-02-18 ENCOUNTER — Ambulatory Visit (INDEPENDENT_AMBULATORY_CARE_PROVIDER_SITE_OTHER): Payer: Medicare Other | Admitting: Internal Medicine

## 2019-02-18 ENCOUNTER — Other Ambulatory Visit: Payer: Self-pay

## 2019-02-18 VITALS — BP 140/73 | HR 68 | Temp 97.0°F | Ht 69.0 in | Wt 174.0 lb

## 2019-02-18 DIAGNOSIS — F5101 Primary insomnia: Secondary | ICD-10-CM | POA: Diagnosis not present

## 2019-02-18 DIAGNOSIS — I10 Essential (primary) hypertension: Secondary | ICD-10-CM

## 2019-02-18 DIAGNOSIS — J4520 Mild intermittent asthma, uncomplicated: Secondary | ICD-10-CM | POA: Diagnosis not present

## 2019-02-18 MED ORDER — ALBUTEROL SULFATE HFA 108 (90 BASE) MCG/ACT IN AERS: 2.0000 | INHALATION_SPRAY | Freq: Four times a day (QID) | RESPIRATORY_TRACT | 1 refills | Status: DC | PRN

## 2019-02-18 NOTE — Progress Notes (Signed)
Date:  02/18/2019   Name:  Cathy Murphy   DOB:  04/16/1941   MRN:  [id number]  I connected with this patient, Cathy Murphy, by telephone at the patient's home.  I verified that I am speaking with the correct person using two identifiers. This visit was conducted via telephone due to the Covid-19 outbreak from my office at [facility] in [place], [place]. I discussed the limitations, risks, security and privacy concerns of performing an evaluation and management service by telephone. I also discussed with the patient that there may be a patient responsible charge related to this service. The patient expressed understanding and agreed to proceed.  Chief Complaint: Shortness of Breath (Used inhaler in past- has asthma. Proair HFA 90 mcg is what she used in the past and it worked. ) and Anxiety (Due to virus. )  Anxiety  Presents for initial visit. The problem has been unchanged. Symptoms include excessive worry, insomnia and nervous/anxious behavior. Patient reports no chest pain, decreased concentration, depressed mood, dizziness or palpitations. Shortness of breath: resolves quickly with rest. Primary symptoms comment: she takes trazodone to help with sleep. Symptoms occur most days. The severity of symptoms is mild. Exacerbated by: social issues related covid-19.   Her past medical history is significant for asthma. Treatments tried: she takes trazodone for sleep - she does not want other medication for anxiety at this time. Compliance with prior treatments has been good.  Asthma  She complains of chest tightness and wheezing. There is no cough, hoarse voice or sputum production. Shortness of breath: resolves quickly with rest. This is a recurrent problem. The current episode started 1 to 4 weeks ago. The problem occurs intermittently. Pertinent negatives include no chest pain, ear pain, fever, headaches, rhinorrhea, sneezing, sore throat or trouble swallowing. Her symptoms are aggravated by  exercise. Her symptoms are alleviated by beta-agonist (in the distant past). Her past medical history is significant for asthma.  Hypertension  This is a chronic problem. The problem is controlled. Associated symptoms include anxiety. Pertinent negatives include no chest pain, headaches or palpitations. Shortness of breath: resolves quickly with rest. Past treatments include ACE inhibitors and calcium channel blockers. The current treatment provides significant improvement.    Review of Systems  Constitutional: Negative for fever.  HENT: Negative for ear pain, hoarse voice, rhinorrhea, sneezing, sore throat and trouble swallowing.   Eyes: Negative for visual disturbance.  Respiratory: Positive for wheezing. Negative for apnea, cough, sputum production and choking. Shortness of breath: resolves quickly with rest.   Cardiovascular: Negative for chest pain, palpitations and leg swelling.  Skin: Negative for rash.  Allergic/Immunologic: Negative for environmental allergies.  Neurological: Negative for dizziness and headaches.  Psychiatric/Behavioral: Negative for decreased concentration. The patient is nervous/anxious and has insomnia.     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   Ventral hernia without obstruction or gangrene 03/06/2016   Controlled type 2 diabetes mellitus without complication, with long-term current use of insulin ([place]) 11/09/2015   Plantar fasciitis, right 06/16/2015   Knee torn cartilage 04/26/2015   Allergic rhinitis, seasonal 04/26/2015   Essential (primary) hypertension 04/26/2015   Glaucoma 04/26/2015   Hyperlipidemia associated with type 2 diabetes mellitus ([place]) 04/26/2015   Insomnia 04/26/2015    Allergies  Allergen Reactions   Amoxicillin Diarrhea   Aspirin Nausea And Vomiting  Metformin And Related Diarrhea   Oxycodone Other (See Comments)    CNS side effects/agitation   Sulfa Antibiotics     Past Surgical History:  Procedure Laterality Date   ABDOMINAL HYSTERECTOMY     COLONOSCOPY WITH PROPOFOL N/A 11/25/2017   Procedure: COLONOSCOPY WITH PROPOFOL;  Surgeon: [name], MD;  Location: [facility] ENDOSCOPY;  Service: Endoscopy;  Laterality: N/A;   LIPOMA RESECTION     right posterior neck    Social History   Tobacco Use   Smoking status: Former Smoker    Packs/day: 1.00    Years: 7.00    Pack years: 7.00    Types: Cigarettes    Last attempt to quit: 1965    Years since quitting: 55.3   Smokeless tobacco: Never Used   Tobacco comment: smoking cessation materials not required  Substance Use Topics   Alcohol use: No   Drug use: No     Medication list has been reviewed and updated.  Current Meds  Medication Sig   amLODipine (NORVASC) 5 MG tablet TAKE 1 TABLET BY MOUTH EVERY DAY   atorvastatin (LIPITOR) 80 MG tablet TAKE 1 TABLET BY MOUTH EVERY DAY   B-D UF III MINI PEN NEEDLES 31G X 5 MM MISC USE AS DIRECTED WITH LEVEMIR   glipiZIDE (GLUCOTROL) 5 MG tablet TAKE 1 TABLET BY MOUTH TWICE A DAY   glucose blood (BAYER CONTOUR TEST) test strip Test once daily.   Insulin Detemir (LEVEMIR) 100 UNIT/ML Pen INJECT 24 UNITS UNDER THE SKIN EVERY DAY   JANUVIA 100 MG tablet TAKE 1 TABLET BY MOUTH EVERY DAY   lisinopril (PRINIVIL,ZESTRIL) 40 MG tablet TAKE 1 TABLET BY MOUTH EVERY DAY   LUMIGAN 0.01 % SOLN TAKE 1 DROP(S) IN BOTH EYES ONCE IN THE EVENING   pioglitazone (ACTOS) 15 MG tablet Take 1 tablet (15 mg total) by mouth daily.   timolol (TIMOPTIC) 0.5 % ophthalmic solution Place 1 drop into both eyes.   traZODone (DESYREL) 50 MG tablet TAKE 1 TABLET BY MOUTH EVERYDAY AT BEDTIME    PHQ 2/9 Scores 02/18/2019 11/06/2018 04/15/2018 03/05/2018  PHQ - 2 Score 2 0 0 0  PHQ- 9 Score 7 - 0 -   GAD 7 : Generalized Anxiety Score 02/18/2019    Nervous, Anxious, on Edge 0  Control/stop worrying 3  Worry too much - different things 3  Trouble relaxing 2  Restless 1  Easily annoyed or irritable 0  Afraid - awful might happen 2  Total GAD 7 Score 11  Anxiety Difficulty Somewhat difficult      BP Readings from Last 3 Encounters:  02/18/19 140/73  01/05/19 140/62  11/06/18 121/72    Physical Exam Pulmonary:     Comments: Voice normal, no apparent dyspnea or cough during the call  Neurological:     Mental Status: She is alert.  Psychiatric:        Attention and Perception: Attention normal.        Mood and Affect: Mood normal.        Speech: Speech normal.        Thought Content: Thought content normal.        Cognition and Memory: Cognition normal.     Comments: She is pleasant, laughing and making jokes during the call.     Wt Readings from Last 3 Encounters:  02/18/19 174 lb (78.9 kg)  01/05/19 174 lb (78.9 kg)  11/06/18 175 lb (79.4 kg)    BP 140/73  Pulse 68    Temp (!) 97 F (36.1 C)    Ht 5\' 9"  (1.753 m)    Wt 174 lb (78.9 kg)    BMI 25.70 kg/m   Assessment and Plan: 1. 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  Dispense: 18 g; Refill: 1  2. Primary insomnia Continue trazodone Mild situation anxiety sx currently - continue to manage without medications Pt aware to call or come in if worsening  3. Essential (primary) hypertension controlled  I spent 11 minutes on the encounter. Partially dictated using Editor, commissioning. Any errors are unintentional.  Halina Maidens, MD [facility]  02/18/2019

## 2019-02-26 ENCOUNTER — Other Ambulatory Visit: Payer: Self-pay | Admitting: Internal Medicine

## 2019-02-26 DIAGNOSIS — E119 Type 2 diabetes mellitus without complications: Secondary | ICD-10-CM

## 2019-02-26 DIAGNOSIS — Z794 Long term (current) use of insulin: Principal | ICD-10-CM

## 2019-03-05 ENCOUNTER — Encounter

## 2019-03-17 ENCOUNTER — Other Ambulatory Visit: Payer: Self-pay | Admitting: Internal Medicine

## 2019-04-28 ENCOUNTER — Ambulatory Visit (INDEPENDENT_AMBULATORY_CARE_PROVIDER_SITE_OTHER): Payer: Medicare Other | Admitting: Internal Medicine

## 2019-04-28 ENCOUNTER — Encounter: Payer: Self-pay | Admitting: Internal Medicine

## 2019-04-28 ENCOUNTER — Other Ambulatory Visit: Payer: Self-pay

## 2019-04-28 VITALS — BP 124/74 | HR 78 | Ht 69.0 in | Wt 182.0 lb

## 2019-04-28 DIAGNOSIS — L989 Disorder of the skin and subcutaneous tissue, unspecified: Secondary | ICD-10-CM

## 2019-04-28 MED ORDER — PREDNISONE 10 MG PO TABS: 10.0000 mg | ORAL_TABLET | ORAL | 0 refills | Status: AC

## 2019-04-28 NOTE — Progress Notes (Signed)
Date:  04/28/2019   Name:  Cathy Murphy   DOB:  Feb 18, 1941   MRN:  [id number]   Chief Complaint: Rash (6-7 bites on her body and they sting and burn when she is biting. )  Rash Associated symptoms include shortness of breath. Pertinent negatives include no fatigue or fever. Past treatments include topical steroids and anti-itch cream. The treatment provided no relief.  She lives in the same house for the past 40 years.  She has bed bug covers on her mattress.  She has no pets.  She does have mosquitos outside and spends some time in the yard.  She has not seen what has bitten her.  She is not too concerned but they do continue to itch.  Her daughter wanted her to come.  Review of Systems  Constitutional: Negative for chills, fatigue and fever.  Respiratory: Positive for shortness of breath. Negative for chest tightness and wheezing.   Cardiovascular: Negative for chest pain and palpitations.  Gastrointestinal: Negative for abdominal distention.  Skin: Positive for rash.  Neurological: Negative for dizziness and headaches.  Psychiatric/Behavioral: Negative for sleep disturbance. The patient is not nervous/anxious.     Patient Active Problem List   Diagnosis Date Noted  . BMI 24.0-24.9, adult 11/06/2018  . Bilateral neck pain 10/08/2018  . Aortic calcification ([place]) 03/05/2018  . 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 Ventral hernia without obstruction or gangrene 03/06/2016  . Controlled type 2 diabetes mellitus without complication, with long-term current use of insulin ([place]) 11/09/2015  . Plantar fasciitis, right 06/16/2015  . Knee torn cartilage 04/26/2015  . Allergic rhinitis, seasonal 04/26/2015  . Essential (primary) hypertension 04/26/2015  . Glaucoma 04/26/2015  . Hyperlipidemia associated with type 2 diabetes mellitus ([place]) 04/26/2015  . Insomnia 04/26/2015    Allergies  Allergen Reactions  . Amoxicillin Diarrhea  . Aspirin Nausea And Vomiting  . Metformin And  Related Diarrhea  . Oxycodone Other (See Comments)    CNS side effects/agitation  . Sulfa Antibiotics     Past Surgical History:  Procedure Laterality Date  . ABDOMINAL HYSTERECTOMY    . COLONOSCOPY WITH PROPOFOL N/A 11/25/2017   Procedure: COLONOSCOPY WITH PROPOFOL;  Surgeon: [name], MD;  Location: [facility] ENDOSCOPY;  Service: Endoscopy;  Laterality: N/A;  . LIPOMA RESECTION     right posterior neck    Social History   Tobacco Use  . Smoking status: Former Smoker    Packs/day: 1.00    Years: 7.00    Pack years: 7.00    Types: Cigarettes    Quit date: 1965    Years since quitting: 55.5  . Smokeless tobacco: Never Used  . Tobacco comment: smoking cessation materials not required  Substance Use Topics  . Alcohol use: No  . Drug use: No     Medication list has been reviewed and updated.  Current Meds  Medication Sig  . albuterol (PROAIR HFA) 108 (90 Base) MCG/ACT inhaler Inhale 2 puffs into the lungs every 6 (six) hours as needed for wheezing or shortness of breath.  Marland Kitchen amLODipine (NORVASC) 5 MG tablet TAKE 1 TABLET BY MOUTH EVERY DAY  . atorvastatin (LIPITOR) 80 MG tablet TAKE 1 TABLET BY MOUTH EVERY DAY  . B-D UF III MINI PEN NEEDLES 31G X 5 MM MISC USE AS DIRECTED WITH LEVEMIR  . cholecalciferol (VITAMIN D) 1000 units tablet Take 2,000 Units by mouth daily.  Marland Kitchen glipiZIDE (GLUCOTROL) 5 MG tablet TAKE 1 TABLET BY MOUTH TWICE A  DAY  . glucose blood (BAYER CONTOUR TEST) test strip Test once daily.  . Insulin Detemir (LEVEMIR) 100 UNIT/ML Pen INJECT 24 UNITS UNDER THE SKIN EVERY DAY  . JANUVIA 100 MG tablet TAKE 1 TABLET BY MOUTH EVERY DAY  . lisinopril (PRINIVIL,ZESTRIL) 40 MG tablet TAKE 1 TABLET BY MOUTH EVERY DAY  . LUMIGAN 0.01 % SOLN TAKE 1 DROP(S) IN BOTH EYES ONCE IN THE EVENING  . pioglitazone (ACTOS) 15 MG tablet TAKE 1 TABLET BY MOUTH EVERY DAY  . timolol (TIMOPTIC) 0.5 % ophthalmic solution Place 1 drop into both eyes.  . traZODone (DESYREL) 50 MG tablet  TAKE 1 TABLET BY MOUTH EVERYDAY AT BEDTIME    PHQ 2/9 Scores 04/28/2019 02/18/2019 11/06/2018 04/15/2018  PHQ - 2 Score 0 2 0 0  PHQ- 9 Score - 7 - 0    BP Readings from Last 3 Encounters:  04/28/19 124/74  02/18/19 140/73  01/05/19 140/62    Physical Exam Vitals signs and nursing note reviewed.  Constitutional:      General: She is not in acute distress.    Appearance: She is well-developed.  HENT:     Head: Normocephalic and atraumatic.  Cardiovascular:     Rate and Rhythm: Normal rate and regular rhythm.     Pulses: Normal pulses.  Pulmonary:     Effort: Pulmonary effort is normal. No respiratory distress.     Breath sounds: No wheezing or rhonchi.  Musculoskeletal: Normal range of motion.  Skin:    General: Skin is warm and dry.     Capillary Refill: Capillary refill takes less than 2 seconds.     Findings: Lesion present. No rash.          Comments: Multiple bites -newest on right shoulder and abdomen - raised red lesions about 1 cm in size Older lesions 4 mm with shallow center and darker edges on legs  Neurological:     Mental Status: She is alert and oriented to person, place, and time.  Psychiatric:        Attention and Perception: Attention normal.        Mood and Affect: Mood normal.        Behavior: Behavior normal.        Thought Content: Thought content normal.     Wt Readings from Last 3 Encounters:  04/28/19 182 lb (82.6 kg)  02/18/19 174 lb (78.9 kg)  01/05/19 174 lb (78.9 kg)    BP 124/74   Pulse 78   Ht 5\' 9"  (1.753 m)   Wt 182 lb (82.6 kg)   SpO2 97%   BMI 26.88 kg/m   Assessment and Plan: 1. Skin lesions Continue topical agents Use insect repellant when going outside - predniSONE (DELTASONE) 10 MG tablet; Take 1 tablet (10 mg total) by mouth as directed for 6 days. Take 6,5,4,3,2,1 then stop  Dispense: 21 tablet; Refill: 0   Partially dictated using [employer], [place]. Any errors are unintentional.  Halina Maidens, MD [facility]  04/28/2019

## 2019-05-12 ENCOUNTER — Encounter: Payer: Self-pay | Admitting: Internal Medicine

## 2019-05-12 ENCOUNTER — Ambulatory Visit (INDEPENDENT_AMBULATORY_CARE_PROVIDER_SITE_OTHER): Payer: Medicare Other | Admitting: Internal Medicine

## 2019-05-12 ENCOUNTER — Other Ambulatory Visit: Payer: Self-pay

## 2019-05-12 VITALS — BP 122/66 | HR 68 | Temp 97.5°F | Ht 69.0 in | Wt 178.0 lb

## 2019-05-12 DIAGNOSIS — Z1231 Encounter for screening mammogram for malignant neoplasm of breast: Secondary | ICD-10-CM

## 2019-05-12 DIAGNOSIS — E119 Type 2 diabetes mellitus without complications: Secondary | ICD-10-CM | POA: Diagnosis not present

## 2019-05-12 DIAGNOSIS — F5101 Primary insomnia: Secondary | ICD-10-CM

## 2019-05-12 DIAGNOSIS — R0609 Other forms of dyspnea: Secondary | ICD-10-CM

## 2019-05-12 DIAGNOSIS — E785 Hyperlipidemia, unspecified: Secondary | ICD-10-CM | POA: Diagnosis not present

## 2019-05-12 DIAGNOSIS — Z13820 Encounter for screening for osteoporosis: Secondary | ICD-10-CM

## 2019-05-12 DIAGNOSIS — Z0000 Encounter for general adult medical examination without abnormal findings: Secondary | ICD-10-CM | POA: Diagnosis not present

## 2019-05-12 DIAGNOSIS — E1169 Type 2 diabetes mellitus with other specified complication: Secondary | ICD-10-CM | POA: Diagnosis not present

## 2019-05-12 DIAGNOSIS — Z794 Long term (current) use of insulin: Secondary | ICD-10-CM

## 2019-05-12 DIAGNOSIS — I10 Essential (primary) hypertension: Secondary | ICD-10-CM | POA: Diagnosis not present

## 2019-05-12 LAB — POCT URINALYSIS DIPSTICK
Bilirubin, UA: NEGATIVE
Blood, UA: NEGATIVE
Glucose, UA: NEGATIVE
Ketones, UA: NEGATIVE
Leukocytes, UA: NEGATIVE
Nitrite, UA: NEGATIVE
Protein, UA: NEGATIVE
Spec Grav, UA: 1.025 (ref 1.010–1.025)
Urobilinogen, UA: 0.2 E.U./dL
pH, UA: 6 (ref 5.0–8.0)

## 2019-05-12 MED ORDER — RAMELTEON 8 MG PO TABS: 8.0000 mg | ORAL_TABLET | Freq: Every day | ORAL | 0 refills | Status: DC

## 2019-05-12 NOTE — Progress Notes (Signed)
Date:  05/12/2019   Name:  Cathy Murphy   DOB:  Mar 17, 1941   MRN:  [id number]   Chief Complaint: Annual Exam (Breast Exam. ) Cathy Murphy is a 78 y.o. female who presents today for her Complete Annual Exam. She feels well. She reports exercising walking some. She reports she is sleeping poorly - having nightmares every night. She denies breast issues - wants to skip mammogram this year.  DEXA - none Mammogram 04/2018 Colonoscopy 11/2017 No pneumonia vaccines done - pt declines  Hypertension This is a chronic problem. The problem is unchanged. The problem is controlled. Associated symptoms include shortness of breath. Pertinent negatives include no chest pain, headaches, orthopnea, palpitations or peripheral edema.  Diabetes She presents for her follow-up diabetic visit. She has type 2 diabetes mellitus. Her disease course has been stable. Pertinent negatives for hypoglycemia include no dizziness, headaches, nervousness/anxiousness or tremors. Pertinent negatives for diabetes include no chest pain, no fatigue, no polydipsia and no polyuria. She monitors blood glucose at home 1-2 x per week. There is no change in her home blood glucose trend. Her breakfast blood glucose is taken between 6-7 am. Her breakfast blood glucose range is generally 110-130 mg/dl.  Hyperlipidemia Associated symptoms include shortness of breath. Pertinent negatives include no chest pain.  Insomnia Primary symptoms: sleep disturbance (and nightmares).   Shortness of Breath This is a new problem. The current episode started more than 1 month ago. The problem occurs daily. The problem has been unchanged. Duration: has to rest for about 30 minutes after exertion. Pertinent negatives include no abdominal pain, chest pain, fever, headaches, leg swelling, orthopnea, rash, vomiting or wheezing. The symptoms are aggravated by exercise (even walking to the mail box). She has tried rest for the symptoms. The treatment provided  significant relief.   Lab Results  Component Value Date   HGBA1C 7.2 (H) 11/06/2018   Lab Results  Component Value Date   CREATININE 1.13 (H) 11/06/2018   BUN 18 11/06/2018   NA 143 11/06/2018   K 4.0 11/06/2018   CL 103 11/06/2018   CO2 26 11/06/2018   Lab Results  Component Value Date   CHOL 207 (H) 03/05/2018   HDL 55 03/05/2018   LDLCALC 135 (H) 03/05/2018   TRIG 87 03/05/2018   CHOLHDL 3.8 03/05/2018   Lab Results  Component Value Date   WBC 5.4 03/05/2018   HGB 13.5 03/05/2018   HCT 42.3 03/05/2018   MCV 90 03/05/2018   PLT 212 03/05/2018     Review of Systems  Constitutional: Negative for chills, fatigue and fever.  HENT: Negative for congestion, hearing loss, tinnitus, trouble swallowing and voice change.   Eyes: Negative for visual disturbance.  Respiratory: Positive for shortness of breath. Negative for cough, chest tightness and wheezing.   Cardiovascular: Negative for chest pain, palpitations, orthopnea and leg swelling.  Gastrointestinal: Negative for abdominal pain, constipation, diarrhea and vomiting.  Endocrine: Negative for polydipsia and polyuria.  Genitourinary: Negative for dysuria, frequency, genital sores, vaginal bleeding and vaginal discharge.  Musculoskeletal: Negative for arthralgias, gait problem and joint swelling.  Skin: Negative for color change and rash.  Allergic/Immunologic: Negative for environmental allergies.  Neurological: Negative for dizziness, tremors, light-headedness and headaches.  Hematological: Negative for adenopathy. Does not bruise/bleed easily.  Psychiatric/Behavioral: Positive for sleep disturbance (and nightmares). Negative for dysphoric mood. The patient has insomnia. The patient is not nervous/anxious.     Patient Active Problem List   Diagnosis Date Noted  .  BMI 24.0-24.9, adult 11/06/2018  . Bilateral neck pain 10/08/2018  . Aortic calcification ([place]) 03/05/2018  . 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  Ventral hernia without obstruction or gangrene 03/06/2016  . Controlled type 2 diabetes mellitus without complication, with long-term current use of insulin ([place]) 11/09/2015  . Plantar fasciitis, right 06/16/2015  . Knee torn cartilage 04/26/2015  . Allergic rhinitis, seasonal 04/26/2015  . Essential (primary) hypertension 04/26/2015  . Glaucoma 04/26/2015  . Hyperlipidemia associated with type 2 diabetes mellitus ([place]) 04/26/2015  . Insomnia 04/26/2015    Allergies  Allergen Reactions  . Amoxicillin Diarrhea  . Aspirin Nausea And Vomiting  . Metformin And Related Diarrhea  . Oxycodone Other (See Comments)    CNS side effects/agitation  . Sulfa Antibiotics     Past Surgical History:  Procedure Laterality Date  . ABDOMINAL HYSTERECTOMY    . COLONOSCOPY WITH PROPOFOL N/A 11/25/2017   Procedure: COLONOSCOPY WITH PROPOFOL;  Surgeon: [name], MD;  Location: [facility] ENDOSCOPY;  Service: Endoscopy;  Laterality: N/A;  . LIPOMA RESECTION     right posterior neck    Social History   Tobacco Use  . Smoking status: Former Smoker    Packs/day: 1.00    Years: 7.00    Pack years: 7.00    Types: Cigarettes    Quit date: 1965    Years since quitting: 55.5  . Smokeless tobacco: Never Used  . Tobacco comment: smoking cessation materials not required  Substance Use Topics  . Alcohol use: No  . Drug use: No     Medication list has been reviewed and updated.  Current Meds  Medication Sig  . albuterol (PROAIR HFA) 108 (90 Base) MCG/ACT inhaler Inhale 2 puffs into the lungs every 6 (six) hours as needed for wheezing or shortness of breath.  Marland Kitchen amLODipine (NORVASC) 5 MG tablet TAKE 1 TABLET BY MOUTH EVERY DAY  . atorvastatin (LIPITOR) 80 MG tablet TAKE 1 TABLET BY MOUTH EVERY DAY  . B-D UF III MINI PEN NEEDLES 31G X 5 MM MISC USE AS DIRECTED WITH LEVEMIR  . cholecalciferol (VITAMIN D) 1000 units tablet Take 2,000 Units by mouth daily.  Marland Kitchen glipiZIDE (GLUCOTROL) 5 MG tablet TAKE 1  TABLET BY MOUTH TWICE A DAY  . glucose blood (BAYER CONTOUR TEST) test strip Test once daily.  . Insulin Detemir (LEVEMIR) 100 UNIT/ML Pen INJECT 24 UNITS UNDER THE SKIN EVERY DAY  . JANUVIA 100 MG tablet TAKE 1 TABLET BY MOUTH EVERY DAY  . lisinopril (PRINIVIL,ZESTRIL) 40 MG tablet TAKE 1 TABLET BY MOUTH EVERY DAY  . LUMIGAN 0.01 % SOLN TAKE 1 DROP(S) IN BOTH EYES ONCE IN THE EVENING  . meloxicam (MOBIC) 7.5 MG tablet   . pioglitazone (ACTOS) 15 MG tablet TAKE 1 TABLET BY MOUTH EVERY DAY  . timolol (TIMOPTIC) 0.5 % ophthalmic solution Place 1 drop into both eyes.  . traZODone (DESYREL) 50 MG tablet TAKE 1 TABLET BY MOUTH EVERYDAY AT BEDTIME    PHQ 2/9 Scores 05/12/2019 04/28/2019 02/18/2019 11/06/2018  PHQ - 2 Score 0 0 2 0  PHQ- 9 Score 6 - 7 -    BP Readings from Last 3 Encounters:  05/12/19 122/66  04/28/19 124/74  02/18/19 140/73    Physical Exam Vitals signs and nursing note reviewed.  Constitutional:      General: She is not in acute distress.    Appearance: She is well-developed.  HENT:     Head: Normocephalic and atraumatic.  Right Ear: Tympanic membrane and ear canal normal.     Left Ear: Tympanic membrane and ear canal normal.     Nose:     Right Sinus: No maxillary sinus tenderness.     Left Sinus: No maxillary sinus tenderness.     Mouth/Throat:     Pharynx: Uvula midline.  Eyes:     General: No scleral icterus.       Right eye: No discharge.        Left eye: No discharge.     Conjunctiva/sclera: Conjunctivae normal.  Neck:     Musculoskeletal: Normal range of motion. No erythema.     Thyroid: No thyromegaly.     Vascular: No carotid bruit.  Cardiovascular:     Rate and Rhythm: Normal rate and regular rhythm.     Pulses: Normal pulses.     Heart sounds: Normal heart sounds.  Pulmonary:     Effort: Pulmonary effort is normal. No respiratory distress.     Breath sounds: No wheezing.  Chest:     Breasts:        Right: No mass, nipple discharge, skin  change or tenderness.        Left: No mass, nipple discharge, skin change or tenderness.  Abdominal:     General: Bowel sounds are normal.     Palpations: Abdomen is soft.     Tenderness: There is no abdominal tenderness.  Musculoskeletal: Normal range of motion.  Lymphadenopathy:     Cervical: No cervical adenopathy.  Skin:    General: Skin is warm and dry.     Findings: No rash.  Neurological:     Mental Status: She is alert and oriented to person, place, and time.     Cranial Nerves: No cranial nerve deficit.     Sensory: No sensory deficit.     Deep Tendon Reflexes: Reflexes are normal and symmetric.  Psychiatric:        Speech: Speech normal.        Behavior: Behavior normal.        Thought Content: Thought content normal.     Wt Readings from Last 3 Encounters:  05/12/19 178 lb (80.7 kg)  04/28/19 182 lb (82.6 kg)  02/18/19 174 lb (78.9 kg)    BP 122/66   Pulse 68   Temp (!) 97.5 F (36.4 C) (Temporal)   Ht 5\' 9"  (1.753 m)   Wt 178 lb (80.7 kg)   SpO2 97%   BMI 26.29 kg/m   Assessment and Plan: 1. Annual physical exam Normal exam - POCT urinalysis dipstick  2. Encounter for screening mammogram for breast cancer Pt declines mammogram this year  3. Encounter for screening for osteoporosis Pt declines DEXA  4. Essential (primary) hypertension controlled - CBC with Differential/Platelet - Ambulatory referral to Cardiology  5. Controlled type 2 diabetes mellitus without complication, with long-term current use of insulin ([facility]) Continue current medications - Comprehensive metabolic panel - Hemoglobin A1c - TSH  6. Hyperlipidemia associated with type 2 diabetes mellitus ([place]) On statin - Lipid panel  7. Primary insomnia Stop Trazodone due to nightmares and try Rozerem - ramelteon (ROZEREM) 8 MG tablet; Take 1 tablet (8 mg total) by mouth at bedtime.  Dispense: 30 tablet; Refill: 0  8. DOE (dyspnea on exertion) Instructed to go to ED of acutely  worse - EKG 12-Lead - SR @ 62; WNL - Ambulatory referral to Cardiology   Partially dictated using [place]. Any errors are unintentional.  Halina Maidens, MD [facility]  05/12/2019

## 2019-05-13 LAB — COMPREHENSIVE METABOLIC PANEL
ALT: 15 IU/L (ref 0–32)
AST: 13 IU/L (ref 0–40)
Albumin/Globulin Ratio: 1.8 (ref 1.2–2.2)
Albumin: 4.4 g/dL (ref 3.7–4.7)
Alkaline Phosphatase: 86 IU/L (ref 39–117)
BUN/Creatinine Ratio: 11 — ABNORMAL LOW (ref 12–28)
BUN: 12 mg/dL (ref 8–27)
Bilirubin Total: 1.4 mg/dL — ABNORMAL HIGH (ref 0.0–1.2)
CO2: 26 mmol/L (ref 20–29)
Calcium: 9.3 mg/dL (ref 8.7–10.3)
Chloride: 99 mmol/L (ref 96–106)
Creatinine, Ser: 1.07 mg/dL — ABNORMAL HIGH (ref 0.57–1.00)
GFR calc Af Amer: 59 mL/min/{1.73_m2} — ABNORMAL LOW (ref >59)
GFR calc non Af Amer: 51 mL/min/{1.73_m2} — ABNORMAL LOW (ref >59)
Globulin, Total: 2.5 g/dL (ref 1.5–4.5)
Glucose: 146 mg/dL — ABNORMAL HIGH (ref 65–99)
Potassium: 4.4 mmol/L (ref 3.5–5.2)
Sodium: 146 mmol/L — ABNORMAL HIGH (ref 134–144)
Total Protein: 6.9 g/dL (ref 6.0–8.5)

## 2019-05-13 LAB — LIPID PANEL
Chol/HDL Ratio: 3.8 ratio (ref 0.0–4.4)
Cholesterol, Total: 192 mg/dL (ref 100–199)
HDL: 51 mg/dL (ref >39)
LDL Calculated: 121 mg/dL — ABNORMAL HIGH (ref 0–99)
Triglycerides: 98 mg/dL (ref 0–149)
VLDL Cholesterol Cal: 20 mg/dL (ref 5–40)

## 2019-05-13 LAB — CBC WITH DIFFERENTIAL/PLATELET
Basophils Absolute: 0.1 10*3/uL (ref 0.0–0.2)
Basos: 1 %
EOS (ABSOLUTE): 0.1 10*3/uL (ref 0.0–0.4)
Eos: 2 %
Hematocrit: 41.6 % (ref 34.0–46.6)
Hemoglobin: 13.9 g/dL (ref 11.1–15.9)
Immature Grans (Abs): 0 10*3/uL (ref 0.0–0.1)
Immature Granulocytes: 1 %
Lymphocytes Absolute: 1.3 10*3/uL (ref 0.7–3.1)
Lymphs: 22 %
MCH: 29.2 pg (ref 26.6–33.0)
MCHC: 33.4 g/dL (ref 31.5–35.7)
MCV: 87 fL (ref 79–97)
Monocytes Absolute: 0.4 10*3/uL (ref 0.1–0.9)
Monocytes: 6 %
Neutrophils Absolute: 4.2 10*3/uL (ref 1.4–7.0)
Neutrophils: 68 %
Platelets: 186 10*3/uL (ref 150–450)
RBC: 4.76 x10E6/uL (ref 3.77–5.28)
RDW: 12.9 % (ref 11.7–15.4)
WBC: 6 10*3/uL (ref 3.4–10.8)

## 2019-05-13 LAB — TSH: TSH: 1.37 u[IU]/mL (ref 0.450–4.500)

## 2019-05-13 LAB — HEMOGLOBIN A1C
Est. average glucose Bld gHb Est-mCnc: 214 mg/dL
Hgb A1c MFr Bld: 9.1 % — ABNORMAL HIGH (ref 4.8–5.6)

## 2019-05-18 DIAGNOSIS — E1169 Type 2 diabetes mellitus with other specified complication: Secondary | ICD-10-CM | POA: Diagnosis not present

## 2019-05-18 DIAGNOSIS — I10 Essential (primary) hypertension: Secondary | ICD-10-CM | POA: Diagnosis not present

## 2019-05-18 DIAGNOSIS — E785 Hyperlipidemia, unspecified: Secondary | ICD-10-CM | POA: Diagnosis not present

## 2019-05-18 DIAGNOSIS — R0600 Dyspnea, unspecified: Secondary | ICD-10-CM | POA: Diagnosis not present

## 2019-06-03 ENCOUNTER — Other Ambulatory Visit: Payer: Self-pay | Admitting: Internal Medicine

## 2019-06-03 DIAGNOSIS — F5101 Primary insomnia: Secondary | ICD-10-CM

## 2019-06-09 DIAGNOSIS — I2510 Atherosclerotic heart disease of native coronary artery without angina pectoris: Secondary | ICD-10-CM | POA: Diagnosis not present

## 2019-06-09 DIAGNOSIS — R0600 Dyspnea, unspecified: Secondary | ICD-10-CM | POA: Diagnosis not present

## 2019-06-09 DIAGNOSIS — I10 Essential (primary) hypertension: Secondary | ICD-10-CM | POA: Diagnosis not present

## 2019-06-09 DIAGNOSIS — I700 Atherosclerosis of aorta: Secondary | ICD-10-CM | POA: Insufficient documentation

## 2019-06-09 DIAGNOSIS — E1169 Type 2 diabetes mellitus with other specified complication: Secondary | ICD-10-CM | POA: Diagnosis not present

## 2019-06-09 DIAGNOSIS — E785 Hyperlipidemia, unspecified: Secondary | ICD-10-CM | POA: Diagnosis not present

## 2019-06-18 ENCOUNTER — Other Ambulatory Visit: Payer: Self-pay | Admitting: Internal Medicine

## 2019-06-22 ENCOUNTER — Other Ambulatory Visit: Payer: Self-pay | Admitting: Internal Medicine

## 2019-06-30 ENCOUNTER — Other Ambulatory Visit: Payer: Self-pay | Admitting: Internal Medicine

## 2019-07-07 ENCOUNTER — Ambulatory Visit (INDEPENDENT_AMBULATORY_CARE_PROVIDER_SITE_OTHER): Payer: Medicare Other

## 2019-07-07 VITALS — BP 118/65 | HR 77 | Ht 69.0 in | Wt 175.0 lb

## 2019-07-07 DIAGNOSIS — Z0000 Encounter for general adult medical examination without abnormal findings: Secondary | ICD-10-CM | POA: Diagnosis not present

## 2019-07-07 DIAGNOSIS — Z1231 Encounter for screening mammogram for malignant neoplasm of breast: Secondary | ICD-10-CM

## 2019-07-07 DIAGNOSIS — Z780 Asymptomatic menopausal state: Secondary | ICD-10-CM

## 2019-07-07 NOTE — Patient Instructions (Addendum)
Cathy Murphy , Thank you for taking time to come for your Medicare Wellness Visit. I appreciate your ongoing commitment to your health goals. Please review the following plan we discussed and let me know if I can assist you in the future.   Screening recommendations/referrals: Colonoscopy: done 01/09/18 Mammogram: done 04/09/18. Please call [phone] to schedule your mammogram and bone density screening.  Recommended yearly ophthalmology/optometry visit for glaucoma screening and checkup Recommended yearly dental visit for hygiene and checkup  Vaccinations: Influenza vaccine: postponed Pneumococcal vaccine: postponed Tdap vaccine: done 03/05/11 Shingles vaccine: Shingrix discussed. Please contact your pharmacy for coverage information.   Advanced directives: Advance directive discussed with you today. I have provided a copy for you to complete at home and have notarized. Once this is complete please bring a copy in to our office so we can scan it into your chart.  Conditions/risks identified: recommend increasing physical activity to at least 3 days per week  Next appointment: Please follow up in one year for your Medicare Annual Wellness visit.     Preventive Care 23 Years and Older, Female Preventive care refers to lifestyle choices and visits with your health care provider that can promote health and wellness. What does preventive care include?  A yearly physical exam. This is also called an annual well check.  Dental exams once or twice a year.  Routine eye exams. Ask your health care provider how often you should have your eyes checked.  Personal lifestyle choices, including:  Daily care of your teeth and gums.  Regular physical activity.  Eating a healthy diet.  Avoiding tobacco and drug use.  Limiting alcohol use.  Practicing safe sex.  Taking low-dose aspirin every day.  Taking vitamin and mineral supplements as recommended by your health care provider. What  happens during an annual well check? The services and screenings done by your health care provider during your annual well check will depend on your age, overall health, lifestyle risk factors, and family history of disease. Counseling  Your health care provider may ask you questions about your:  Alcohol use.  Tobacco use.  Drug use.  Emotional well-being.  Home and relationship well-being.  Sexual activity.  Eating habits.  History of falls.  Memory and ability to understand (cognition).  Work and work Statistician.  Reproductive health. Screening  You may have the following tests or measurements:  Height, weight, and BMI.  Blood pressure.  Lipid and cholesterol levels. These may be checked every 5 years, or more frequently if you are over 53 years old.  Skin check.  Lung cancer screening. You may have this screening every year starting at age 3 if you have a 30-pack-year history of smoking and currently smoke or have quit within the past 15 years.  Fecal occult blood test (FOBT) of the stool. You may have this test every year starting at age 35.  Flexible sigmoidoscopy or colonoscopy. You may have a sigmoidoscopy every 5 years or a colonoscopy every 10 years starting at age 74.  Hepatitis C blood test.  Hepatitis B blood test.  Sexually transmitted disease (STD) testing.  Diabetes screening. This is done by checking your blood sugar (glucose) after you have not eaten for a while (fasting). You may have this done every 1-3 years.  Bone density scan. This is done to screen for osteoporosis. You may have this done starting at age 68.  Mammogram. This may be done every 1-2 years. Talk to your health care provider about how  often you should have regular mammograms. Talk with your health care provider about your test results, treatment options, and if necessary, the need for more tests. Vaccines  Your health care provider may recommend certain vaccines, such  as:  Influenza vaccine. This is recommended every year.  Tetanus, diphtheria, and acellular pertussis (Tdap, Td) vaccine. You may need a Td booster every 10 years.  Zoster vaccine. You may need this after age 81.  Pneumococcal 13-valent conjugate (PCV13) vaccine. One dose is recommended after age 69.  Pneumococcal polysaccharide (PPSV23) vaccine. One dose is recommended after age 41. Talk to your health care provider about which screenings and vaccines you need and how often you need them. This information is not intended to replace advice given to you by your health care provider. Make sure you discuss any questions you have with your health care provider. Document Released: 11/17/2015 Document Revised: 07/10/2016 Document Reviewed: 08/22/2015 Elsevier Interactive Patient Education  2017 [place] Prevention in the Home Falls can cause injuries. They can happen to people of all ages. There are many things you can do to make your home safe and to help prevent falls. What can I do on the outside of my home?  Regularly fix the edges of walkways and driveways and fix any cracks.  Remove anything that might make you trip as you walk through a door, such as a raised step or threshold.  Trim any bushes or trees on the path to your home.  Use bright outdoor lighting.  Clear any walking paths of anything that might make someone trip, such as rocks or tools.  Regularly check to see if handrails are loose or broken. Make sure that both sides of any steps have handrails.  Any raised decks and porches should have guardrails on the edges.  Have any leaves, snow, or ice cleared regularly.  Use sand or salt on walking paths during winter.  Clean up any spills in your garage right away. This includes oil or grease spills. What can I do in the bathroom?  Use night lights.  Install grab bars by the toilet and in the tub and shower. Do not use towel bars as grab bars.  Use  non-skid mats or decals in the tub or shower.  If you need to sit down in the shower, use a plastic, non-slip stool.  Keep the floor dry. Clean up any water that spills on the floor as soon as it happens.  Remove soap buildup in the tub or shower regularly.  Attach bath mats securely with double-sided non-slip rug tape.  Do not have throw rugs and other things on the floor that can make you trip. What can I do in the bedroom?  Use night lights.  Make sure that you have a light by your bed that is easy to reach.  Do not use any sheets or blankets that are too big for your bed. They should not hang down onto the floor.  Have a firm chair that has side arms. You can use this for support while you get dressed.  Do not have throw rugs and other things on the floor that can make you trip. What can I do in the kitchen?  Clean up any spills right away.  Avoid walking on wet floors.  Keep items that you use a lot in easy-to-reach places.  If you need to reach something above you, use a strong step stool that has a grab bar.  Keep electrical  cords out of the way.  Do not use floor polish or wax that makes floors slippery. If you must use wax, use non-skid floor wax.  Do not have throw rugs and other things on the floor that can make you trip. What can I do with my stairs?  Do not leave any items on the stairs.  Make sure that there are handrails on both sides of the stairs and use them. Fix handrails that are broken or loose. Make sure that handrails are as long as the stairways.  Check any carpeting to make sure that it is firmly attached to the stairs. Fix any carpet that is loose or worn.  Avoid having throw rugs at the top or bottom of the stairs. If you do have throw rugs, attach them to the floor with carpet tape.  Make sure that you have a light switch at the top of the stairs and the bottom of the stairs. If you do not have them, ask someone to add them for you. What  else can I do to help prevent falls?  Wear shoes that:  Do not have high heels.  Have rubber bottoms.  Are comfortable and fit you well.  Are closed at the toe. Do not wear sandals.  If you use a stepladder:  Make sure that it is fully opened. Do not climb a closed stepladder.  Make sure that both sides of the stepladder are locked into place.  Ask someone to hold it for you, if possible.  Clearly mark and make sure that you can see:  Any grab bars or handrails.  First and last steps.  Where the edge of each step is.  Use tools that help you move around (mobility aids) if they are needed. These include:  Canes.  Walkers.  Scooters.  Crutches.  Turn on the lights when you go into a dark area. Replace any light bulbs as soon as they burn out.  Set up your furniture so you have a clear path. Avoid moving your furniture around.  If any of your floors are uneven, fix them.  If there are any pets around you, be aware of where they are.  Review your medicines with your doctor. Some medicines can make you feel dizzy. This can increase your chance of falling. Ask your doctor what other things that you can do to help prevent falls. This information is not intended to replace advice given to you by your health care provider. Make sure you discuss any questions you have with your health care provider. Document Released: 08/17/2009 Document Revised: 03/28/2016 Document Reviewed: 11/25/2014 Elsevier Interactive Patient Education  2017 [employer].

## 2019-07-07 NOTE — Progress Notes (Signed)
Subjective:   Cathy Murphy is a 78 y.o. female who presents for Medicare Annual (Subsequent) preventive examination.  Virtual Visit via Telephone Note  I connected with [name] on 07/07/19 at 10:00 AM EDT by telephone and verified that I am speaking with the correct person using two identifiers.  Medicare Annual Wellness visit completed telephonically due to Covid-19 pandemic.   Location: Patient: home Provider: office   I discussed the limitations, risks, security and privacy concerns of performing an evaluation and management service by telephone and the availability of in person appointments. The patient expressed understanding and agreed to proceed.  Some vital signs may be absent or patient reported.   Clemetine Marker, LPN   Review of Systems:   Cardiac Risk Factors include: advanced age (>61men, >6 women);diabetes mellitus;dyslipidemia;hypertension     Objective:     Vitals: BP 118/65    Pulse 77    Ht 5\' 9"  (1.753 m)    Wt 175 lb (79.4 kg)    BMI 25.84 kg/m   Body mass index is 25.84 kg/m.  Advanced Directives 07/07/2019 05/25/2018 04/15/2018 03/05/2018 03/05/2018 11/25/2017 04/07/2017  Does Patient Have a Medical Advance Directive? No No No Yes Yes No No  Type of Advance Directive - - Public librarian;Living will - - -  Does patient want to make changes to medical advance directive? - - Yes (MAU/Ambulatory/Procedural Areas - Information given) No - Patient declined No - Patient declined - -  Copy of [place] in Chart? - - - No - copy requested - - -  Would patient like information on creating a medical advance directive? Yes (MAU/Ambulatory/Procedural Areas - Information given) - - - - No - Patient declined Yes (MAU/Ambulatory/Procedural Areas - Information given)    Tobacco Social History   Tobacco Use  Smoking Status Former Smoker   Packs/day: 1.00   Years: 7.00   Pack years: 7.00   Types: Cigarettes   Quit date: 1965    Years since quitting: 55.7  Smokeless Tobacco Never Used  Tobacco Comment   smoking cessation materials not required     Counseling given: Not Answered Comment: smoking cessation materials not required   Clinical Intake:  Pre-visit preparation completed: Yes  Pain : (generalized arthritis pain)     BMI - recorded: 25.84 Nutritional Status: BMI 25 -29 Overweight Nutritional Risks: None Diabetes: Yes CBG done?: No Did pt. bring in CBG monitor from home?: No   Nutrition Risk Assessment:  Has the patient had any N/V/D within the last 2 months?  No  Does the patient have any non-healing wounds?  No  Has the patient had any unintentional weight loss or weight gain?  No   Diabetes:  Is the patient diabetic?  Yes  If diabetic, was a CBG obtained today?  No  Did the patient bring in their glucometer from home?  No  How often do you monitor your CBG's? Daily fasting in AM today = 107.   Financial Strains and Diabetes Management:  Are you having any financial strains with the device, your supplies or your medication? No .  Does the patient want to be seen by Chronic Care Management for management of their diabetes?  No  Would the patient like to be referred to a Nutritionist or for Diabetic Management?  No   Diabetic Exams:  Diabetic Eye Exam: Completed 05/22/18. Overdue for diabetic eye exam. Postponed due to Covid-19. Pt has been advised about the  importance in completing this exam.   Diabetic Foot Exam: Completed 07/15/18.   How often do you need to have someone help you when you read instructions, pamphlets, or other written materials from your doctor or pharmacy?: 1 - Never  Interpreter Needed?: No  Information entered by :: Clemetine Marker LPN  Past Medical History:  Diagnosis Date   Allergy    Anxiety    Diabetes mellitus without complication ([place])    Glaucoma    Hyperlipidemia    Hypertension    Past Surgical History:  Procedure Laterality Date    ABDOMINAL HYSTERECTOMY     COLONOSCOPY WITH PROPOFOL N/A 11/25/2017   Procedure: COLONOSCOPY WITH PROPOFOL;  Surgeon: [name], MD;  Location: [facility] ENDOSCOPY;  Service: Endoscopy;  Laterality: N/A;   LIPOMA RESECTION     right posterior neck   Family History  Problem Relation Age of Onset   Cancer Mother        Breast   Heart disease Father    Heart attack Brother    Cancer Maternal Aunt        breast   Social History   Socioeconomic History   Marital status: Widowed    Spouse name: Not on file   Number of children: 1   Years of education: Not on file   Highest education level: 12th grade  Occupational History    Employer: [employer]  Social Designer, fashion/clothing strain: Not hard at all   Food insecurity    Worry: Never true    Inability: Never true   Transportation needs    Medical: No    Non-medical: No  Tobacco Use   Smoking status: Former Smoker    Packs/day: 1.00    Years: 7.00    Pack years: 7.00    Types: Cigarettes    Quit date: 1965    Years since quitting: 55.7   Smokeless tobacco: Never Used   Tobacco comment: smoking cessation materials not required  Substance and Sexual Activity   Alcohol use: No   Drug use: No   Sexual activity: Never  Lifestyle   Physical activity    Days per week: 0 days    Minutes per session: 0 min   Stress: Not at all  Relationships   Social connections    Talks on phone: Patient refused    Gets together: Patient refused    Attends religious service: Patient refused    Active member of club or organization: Patient refused    Attends meetings of clubs or organizations: Patient refused    Relationship status: Widowed  Other Topics Concern   Not on file  Social History Narrative   Not on file    Outpatient Encounter Medications as of 07/07/2019  Medication Sig   albuterol (PROAIR HFA) 108 (90 Base) MCG/ACT inhaler Inhale 2 puffs into the lungs every 6 (six) hours as needed for wheezing  or shortness of breath.   amLODipine (NORVASC) 5 MG tablet TAKE 1 TABLET BY MOUTH EVERY DAY   atorvastatin (LIPITOR) 80 MG tablet TAKE 1 TABLET BY MOUTH EVERY DAY   B-D UF III MINI PEN NEEDLES 31G X 5 MM MISC USE AS DIRECTED WITH LEVEMIR   cholecalciferol (VITAMIN D) 1000 units tablet Take 2,000 Units by mouth daily.   ezetimibe (ZETIA) 10 MG tablet Take 10 mg by mouth daily.   glipiZIDE (GLUCOTROL) 5 MG tablet TAKE 1 TABLET BY MOUTH TWICE A DAY   glucose blood (BAYER CONTOUR TEST)  test strip Test once daily.   Insulin Detemir (LEVEMIR FLEXTOUCH) 100 UNIT/ML Pen Inject 30 Units into the skin daily.   JANUVIA 100 MG tablet TAKE 1 TABLET BY MOUTH EVERY DAY   lisinopril (PRINIVIL,ZESTRIL) 40 MG tablet TAKE 1 TABLET BY MOUTH EVERY DAY   LUMIGAN 0.01 % SOLN TAKE 1 DROP(S) IN BOTH EYES ONCE IN THE EVENING   pioglitazone (ACTOS) 15 MG tablet TAKE 1 TABLET BY MOUTH EVERY DAY   timolol (TIMOPTIC) 0.5 % ophthalmic solution Place 1 drop into both eyes.   traZODone (DESYREL) 50 MG tablet TAKE 1 TABLET BY MOUTH EVERYDAY AT BEDTIME   [DISCONTINUED] meloxicam (MOBIC) 7.5 MG tablet    [DISCONTINUED] ramelteon (ROZEREM) 8 MG tablet TAKE 1 TABLET (8 MG TOTAL) BY MOUTH AT BEDTIME.   No facility-administered encounter medications on file as of 07/07/2019.     Activities of Daily Living In your present state of health, do you have any difficulty performing the following activities: 07/07/2019 05/12/2019  Hearing? N N  Comment declines hearing aids -  Vision? N N  Difficulty concentrating or making decisions? N N  Walking or climbing stairs? N N  Dressing or bathing? N N  Doing errands, shopping? N N  Preparing Food and eating ? N -  Using the Toilet? N -  In the past six months, have you accidently leaked urine? N -  Do you have problems with loss of bowel control? N -  Managing your Medications? N -  Managing your Finances? N -  Housekeeping or managing your Housekeeping? N -  Some recent  data might be hidden    Patient Care Team: Glean Hess, MD as PCP - General (Internal Medicine) [facility] (Ophthalmology)    Assessment:   This is a routine wellness examination for [place].  Exercise Activities and Dietary recommendations Current Exercise Habits: The patient does not participate in regular exercise at present, Exercise limited by: orthopedic condition(s)  Goals     DIET - INCREASE WATER INTAKE     Recommend to drink at least 6-8 8oz glasses of water per day.       Fall Risk Fall Risk  07/07/2019 02/18/2019 11/06/2018 04/15/2018 03/05/2018  Falls in the past year? 1 0 0 No No  Number falls in past yr: 1 0 0 - -  Injury with Fall? 0 0 0 - -  Risk for fall due to : - - - Impaired vision;Medication side effect -  Risk for fall due to: Comment - - - wears eyeglasses -  Follow up Falls prevention discussed Falls evaluation completed - - -   FALL RISK PREVENTION PERTAINING TO THE HOME:  Any stairs in or around the home? Yes  If so, do they handrails? Yes   Home free of loose throw rugs in walkways, pet beds, electrical cords, etc? Yes  Adequate lighting in your home to reduce risk of falls? Yes   ASSISTIVE DEVICES UTILIZED TO PREVENT FALLS:  Life alert? No  Use of a cane, walker or w/c? No  Grab bars in the bathroom? No  Shower chair or bench in shower? No  Elevated toilet seat or a handicapped toilet? Yes  DME ORDERS:  DME order needed?  No   TIMED UP AND GO:  Was the test performed? No . Telephonic visit.   Education: Fall risk prevention has been discussed.  Intervention(s) required? No    Depression Screen PHQ 2/9 Scores 07/07/2019 05/12/2019 04/28/2019 02/18/2019  PHQ - 2  Score 2 0 0 2  PHQ- 9 Score 4 6 - 7     Cognitive Function     6CIT Screen 07/07/2019 04/15/2018 04/07/2017  What Year? 0 points 0 points 0 points  What month? 0 points 0 points 0 points  What time? 0 points 0 points 0 points  Count back from 20 0 points 2 points 0 points    Months in reverse 0 points 2 points 0 points  Repeat phrase 0 points 2 points 0 points  Total Score 0 6 0    Immunization History  Administered Date(s) Administered   Influenza-Unspecified 03/06/2016   Td 03/05/2011    Qualifies for Shingles Vaccine? Yes . Due for Shingrix. Education has been provided regarding the importance of this vaccine. Pt has been advised to call insurance company to determine out of pocket expense. Advised may also receive vaccine at local pharmacy or Health Dept. Verbalized acceptance and understanding.  Tdap: Up to date  Flu Vaccine: Due for Flu vaccine. Does the patient want to receive this vaccine today?  No . Education has been provided regarding the importance of this vaccine but still declined. Advised may receive this vaccine at local pharmacy or Health Dept. Aware to provide a copy of the vaccination record if obtained from local pharmacy or Health Dept. Verbalized acceptance and understanding.  Pneumococcal Vaccine: Due for Pneumococcal vaccine. Does the patient want to receive this vaccine today?  No . Education has been provided regarding the importance of this vaccine but still declined. Advised may receive this vaccine at local pharmacy or Health Dept. Aware to provide a copy of the vaccination record if obtained from local pharmacy or Health Dept. Verbalized acceptance and understanding.   Screening Tests Health Maintenance  Topic Date Due   DEXA SCAN  10/28/2008   PNA vac Low Risk Adult (1 of 2 - PCV13) 10/28/2008   MAMMOGRAM  04/10/2019   OPHTHALMOLOGY EXAM  05/23/2019   INFLUENZA VACCINE  06/05/2019   FOOT EXAM  07/16/2019   HEMOGLOBIN A1C  11/12/2019   TETANUS/TDAP  03/04/2021   COLONOSCOPY  11/26/2027    Cancer Screenings:  Colorectal Screening: Completed 01/09/18.  No longer required.   Mammogram: Completed 04/09/18. Repeat every year. Ordered today. Pt provided with contact information and advised to call to schedule appt.    Bone Density: Not completed. Ordered today. Pt provided with contact information and advised to call to schedule appt.   Lung Cancer Screening: (Low Dose CT Chest recommended if Age 33-80 years, 30 pack-year currently smoking OR have quit w/in 15years.) does not qualify.    Additional Screening:  Hepatitis C Screening: no longer required  Vision Screening: Recommended annual ophthalmology exams for early detection of glaucoma and other disorders of the eye. Is the patient up to date with their annual eye exam?  No  - postponed due to Covid Who is the provider or what is the name of the office in which the pt attends annual eye exams? [place] Screening: Recommended annual dental exams for proper oral hygiene  Community Resource Referral:  CRR required this visit?  No      Plan:     I have personally reviewed and addressed the Medicare Annual Wellness questionnaire and have noted the following in the patients chart:  A. Medical and social history B. Use of alcohol, tobacco or illicit drugs  C. Current medications and supplements D. Functional ability and status E.  Nutritional status F.  Physical activity G. Advance directives H. List of other physicians I.  Hospitalizations, surgeries, and ER visits in previous 12 months J.  [place] such as hearing and vision if needed, cognitive and depression L. Referrals and appointments   In addition, I have reviewed and discussed with patient certain preventive protocols, quality metrics, and best practice recommendations. A written personalized care plan for preventive services as well as general preventive health recommendations were provided to patient.   Signed,  Clemetine Marker, LPN Nurse Health Advisor   Nurse Notes: pt states she is interested in taking prevagen for her memory, advised to discuss with Dr. Army Melia at next appt.

## 2019-07-31 ENCOUNTER — Other Ambulatory Visit: Payer: Self-pay | Admitting: Internal Medicine

## 2019-07-31 DIAGNOSIS — I10 Essential (primary) hypertension: Secondary | ICD-10-CM

## 2019-08-04 ENCOUNTER — Ambulatory Visit
Admission: RE | Admit: 2019-08-04 | Discharge: 2019-08-04 | Disposition: A | Discharge status: Home / Self Care | Payer: Medicare Other | Source: Ambulatory Visit | Attending: Internal Medicine | Admitting: Internal Medicine

## 2019-08-04 ENCOUNTER — Other Ambulatory Visit: Payer: Self-pay

## 2019-08-04 DIAGNOSIS — Z780 Asymptomatic menopausal state: Secondary | ICD-10-CM

## 2019-08-04 DIAGNOSIS — Z1231 Encounter for screening mammogram for malignant neoplasm of breast: Secondary | ICD-10-CM | POA: Diagnosis not present

## 2019-08-04 DIAGNOSIS — M8589 Other specified disorders of bone density and structure, multiple sites: Secondary | ICD-10-CM | POA: Diagnosis not present

## 2019-08-05 ENCOUNTER — Encounter: Payer: Self-pay | Admitting: Internal Medicine

## 2019-08-05 DIAGNOSIS — M8580 Other specified disorders of bone density and structure, unspecified site: Secondary | ICD-10-CM | POA: Insufficient documentation

## 2019-08-10 ENCOUNTER — Other Ambulatory Visit: Payer: Self-pay | Admitting: Internal Medicine

## 2019-08-10 ENCOUNTER — Inpatient Hospital Stay
Admission: RE | Admit: 2019-08-10 | Discharge: 2019-08-10 | Disposition: A | Discharge status: Home / Self Care | Payer: Self-pay | Source: Ambulatory Visit | Attending: Internal Medicine | Admitting: Internal Medicine

## 2019-08-10 DIAGNOSIS — Z1231 Encounter for screening mammogram for malignant neoplasm of breast: Secondary | ICD-10-CM

## 2019-08-10 DIAGNOSIS — R928 Other abnormal and inconclusive findings on diagnostic imaging of breast: Secondary | ICD-10-CM

## 2019-08-17 ENCOUNTER — Ambulatory Visit
Admission: RE | Admit: 2019-08-17 | Discharge: 2019-08-17 | Disposition: A | Discharge status: Home / Self Care | Payer: Medicare Other | Source: Ambulatory Visit | Attending: Internal Medicine | Admitting: Internal Medicine

## 2019-08-17 DIAGNOSIS — R922 Inconclusive mammogram: Secondary | ICD-10-CM | POA: Diagnosis not present

## 2019-08-17 DIAGNOSIS — R928 Other abnormal and inconclusive findings on diagnostic imaging of breast: Secondary | ICD-10-CM

## 2019-09-14 ENCOUNTER — Ambulatory Visit (INDEPENDENT_AMBULATORY_CARE_PROVIDER_SITE_OTHER): Payer: Medicare Other | Admitting: Internal Medicine

## 2019-09-14 ENCOUNTER — Encounter: Payer: Self-pay | Admitting: Internal Medicine

## 2019-09-14 ENCOUNTER — Other Ambulatory Visit: Payer: Self-pay

## 2019-09-14 VITALS — BP 120/78 | HR 68 | Ht 69.0 in | Wt 181.0 lb

## 2019-09-14 DIAGNOSIS — E1169 Type 2 diabetes mellitus with other specified complication: Secondary | ICD-10-CM

## 2019-09-14 DIAGNOSIS — I700 Atherosclerosis of aorta: Secondary | ICD-10-CM | POA: Diagnosis not present

## 2019-09-14 DIAGNOSIS — E785 Hyperlipidemia, unspecified: Secondary | ICD-10-CM | POA: Diagnosis not present

## 2019-09-14 DIAGNOSIS — E118 Type 2 diabetes mellitus with unspecified complications: Secondary | ICD-10-CM | POA: Diagnosis not present

## 2019-09-14 DIAGNOSIS — I10 Essential (primary) hypertension: Secondary | ICD-10-CM

## 2019-09-14 NOTE — Progress Notes (Signed)
Date:  09/14/2019   Name:  Cathy Murphy   DOB:  12/12/1940   MRN:  [id number]   Chief Complaint: Diabetes (Foot exam. Declined flu shot and pnuemonia vaccines.) and Hypertension   Hypertension This is a chronic problem. The problem is controlled. Pertinent negatives include no chest pain, headaches, palpitations or shortness of breath. Past treatments include calcium channel blockers and ACE inhibitors. The current treatment provides significant improvement. There are no compliance problems.   Diabetes She presents for her follow-up diabetic visit. She has type 2 diabetes mellitus. Disease course: A1C much worse last visit. Pertinent negatives for hypoglycemia include no headaches or tremors. Pertinent negatives for diabetes include no chest pain, no fatigue, no polydipsia and no polyuria. Symptoms are improving. Current diabetic treatment includes insulin injections (insulin increased to 30 units last visit; also on januvia, actos and glipizide). She participates in exercise intermittently. She monitors blood glucose at home 1-2 x per day. Her breakfast blood glucose is taken between 7-8 am. Her breakfast blood glucose range is generally 90-110 mg/dl. An ACE inhibitor/angiotensin II receptor blocker is being taken. Eye exam is not current.  Hyperlipidemia This is a chronic problem. The problem is controlled. Pertinent negatives include no chest pain or shortness of breath. The current treatment provides significant improvement of lipids.    Lab Results  Component Value Date   CREATININE 1.07 (H) 05/12/2019   BUN 12 05/12/2019   NA 146 (H) 05/12/2019   K 4.4 05/12/2019   CL 99 05/12/2019   CO2 26 05/12/2019   Lab Results  Component Value Date   CHOL 192 05/12/2019   HDL 51 05/12/2019   LDLCALC 121 (H) 05/12/2019   TRIG 98 05/12/2019   CHOLHDL 3.8 05/12/2019   Lab Results  Component Value Date   TSH 1.370 05/12/2019   Lab Results  Component Value Date   HGBA1C 9.1 (H)  05/12/2019     Review of Systems  Constitutional: Negative for appetite change, fatigue, fever and unexpected weight change.  HENT: Negative for tinnitus and trouble swallowing.   Eyes: Negative for visual disturbance.  Respiratory: Negative for cough, chest tightness and shortness of breath.   Cardiovascular: Negative for chest pain, palpitations and leg swelling.  Gastrointestinal: Negative for abdominal pain.  Endocrine: Negative for polydipsia and polyuria.  Genitourinary: Negative for dysuria and hematuria.  Musculoskeletal: Negative for arthralgias.  Neurological: Negative for tremors, numbness and headaches.  Psychiatric/Behavioral: Negative for dysphoric mood.    Patient Active Problem List   Diagnosis Date Noted  . Osteopenia determined by x-ray 08/05/2019  . Atherosclerosis of abdominal aorta ([place]) 06/09/2019  . Coronary artery disease involving native coronary artery of native heart 06/09/2019  . BMI 24.0-24.9, adult 11/06/2018  . Bilateral neck pain 10/08/2018  . Aortic calcification ([place]) 03/05/2018  . 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 Ventral hernia without obstruction or gangrene 03/06/2016  . Type II diabetes mellitus with complication ([place]) [id number]  . Plantar fasciitis, right 06/16/2015  . Knee torn cartilage 04/26/2015  . Allergic rhinitis, seasonal 04/26/2015  . Essential (primary) hypertension 04/26/2015  . Glaucoma 04/26/2015  . Hyperlipidemia associated with type 2 diabetes mellitus ([place]) 04/26/2015  . Insomnia 04/26/2015    Allergies  Allergen Reactions  . Amoxicillin Diarrhea  . Aspirin Nausea And Vomiting  . Metformin And Related Diarrhea  . Oxycodone Other (See Comments)    CNS side effects/agitation  . Sulfa Antibiotics     Past Surgical History:  Procedure Laterality Date  .  ABDOMINAL HYSTERECTOMY    . COLONOSCOPY WITH PROPOFOL N/A 11/25/2017   Procedure: COLONOSCOPY WITH PROPOFOL;  Surgeon: [name], MD;  Location: [facility]  ENDOSCOPY;  Service: Endoscopy;  Laterality: N/A;  . LIPOMA RESECTION     right posterior neck    Social History   Tobacco Use  . Smoking status: Former Smoker    Packs/day: 1.00    Years: 7.00    Pack years: 7.00    Types: Cigarettes    Quit date: 1965    Years since quitting: 55.8  . Smokeless tobacco: Never Used  . Tobacco comment: smoking cessation materials not required  Substance Use Topics  . Alcohol use: No  . Drug use: No     Medication list has been reviewed and updated.  Current Meds  Medication Sig  . albuterol (PROAIR HFA) 108 (90 Base) MCG/ACT inhaler Inhale 2 puffs into the lungs every 6 (six) hours as needed for wheezing or shortness of breath.  Marland Kitchen amLODipine (NORVASC) 5 MG tablet TAKE 1 TABLET BY MOUTH EVERY DAY  . atorvastatin (LIPITOR) 80 MG tablet TAKE 1 TABLET BY MOUTH EVERY DAY  . B-D UF III MINI PEN NEEDLES 31G X 5 MM MISC USE AS DIRECTED WITH LEVEMIR  . cholecalciferol (VITAMIN D) 1000 units tablet Take 2,000 Units by mouth daily.  Marland Kitchen ezetimibe (ZETIA) 10 MG tablet Take 10 mg by mouth daily.  Marland Kitchen glipiZIDE (GLUCOTROL) 5 MG tablet TAKE 1 TABLET BY MOUTH TWICE A DAY  . glucose blood (BAYER CONTOUR TEST) test strip Test once daily.  . Insulin Detemir (LEVEMIR FLEXTOUCH) 100 UNIT/ML Pen Inject 30 Units into the skin daily.  Marland Kitchen JANUVIA 100 MG tablet TAKE 1 TABLET BY MOUTH EVERY DAY  . lisinopril (ZESTRIL) 40 MG tablet TAKE 1 TABLET BY MOUTH EVERY DAY  . LUMIGAN 0.01 % SOLN TAKE 1 DROP(S) IN BOTH EYES ONCE IN THE EVENING  . pioglitazone (ACTOS) 15 MG tablet TAKE 1 TABLET BY MOUTH EVERY DAY  . timolol (TIMOPTIC) 0.5 % ophthalmic solution Place 1 drop into both eyes.  . traZODone (DESYREL) 50 MG tablet TAKE 1 TABLET BY MOUTH EVERYDAY AT BEDTIME    PHQ 2/9 Scores 09/14/2019 07/07/2019 05/12/2019 04/28/2019  PHQ - 2 Score 0 2 0 0  PHQ- 9 Score - 4 6 -    BP Readings from Last 3 Encounters:  09/14/19 120/78  07/07/19 118/65  05/12/19 122/66    Physical Exam  Vitals signs and nursing note reviewed.  Constitutional:      General: She is not in acute distress.    Appearance: She is well-developed.  HENT:     Head: Normocephalic and atraumatic.  Neck:     Musculoskeletal: Normal range of motion.  Cardiovascular:     Rate and Rhythm: Normal rate and regular rhythm.     Pulses: Normal pulses.     Heart sounds: No murmur.  Pulmonary:     Effort: Pulmonary effort is normal. No respiratory distress.  Musculoskeletal: Normal range of motion.     Right lower leg: No edema.     Left lower leg: No edema.  Lymphadenopathy:     Cervical: No cervical adenopathy.  Skin:    General: Skin is warm and dry.     Findings: No rash.  Neurological:     Mental Status: She is alert and oriented to person, place, and time.  Psychiatric:        Behavior: Behavior normal.  Thought Content: Thought content normal.     Wt Readings from Last 3 Encounters:  09/14/19 181 lb (82.1 kg)  07/07/19 175 lb (79.4 kg)  05/12/19 178 lb (80.7 kg)    BP 120/78   Pulse 68   Ht 5\' 9"  (1.753 m)   Wt 181 lb (82.1 kg)   SpO2 99%   BMI 26.73 kg/m   Assessment and Plan: 1. Essential (primary) hypertension Clinically stable exam with well controlled BP.   Tolerating medications, amlodipine 5 mg, lisinopril 40 mg, without side effects at this time. Pt to continue current regimen and low sodium diet; benefits of regular exercise as able discussed. - Basic metabolic panel  2. Type II diabetes mellitus with complication (HCC) Clinically stable by exam and report without s/s of hypoglycemia. DM complicated by HTN, lipids. Pt to schedule DM eye exam Tolerating medications Levemir 30 u, glipizide 5 mg bid, Januvia 100 mg and Actos 15 mg well without side effects or other concerns. - Hemoglobin A1c  3. Aortic calcification (HCC) On max dose of high intensity statin with zetia recently added to regimen Pt denies side effects to either medication  4. Hyperlipidemia  associated with type 2 diabetes mellitus ([place]) Now on zetia along with Atorvastatin 40 mg No side effects; will recheck labs - Lipid panel   Partially dictated using Dragon software. Any errors are unintentional.  Halina Maidens, MD [facility]  09/14/2019

## 2019-09-15 LAB — HEMOGLOBIN A1C
Est. average glucose Bld gHb Est-mCnc: 166 mg/dL
Hgb A1c MFr Bld: 7.4 % — ABNORMAL HIGH (ref 4.8–5.6)

## 2019-09-15 LAB — LIPID PANEL
Chol/HDL Ratio: 3 ratio (ref 0.0–4.4)
Cholesterol, Total: 178 mg/dL (ref 100–199)
HDL: 59 mg/dL (ref >39)
LDL Chol Calc (NIH): 103 mg/dL — ABNORMAL HIGH (ref 0–99)
Triglycerides: 90 mg/dL (ref 0–149)
VLDL Cholesterol Cal: 16 mg/dL (ref 5–40)

## 2019-09-15 LAB — BASIC METABOLIC PANEL
BUN/Creatinine Ratio: 14 (ref 12–28)
BUN: 16 mg/dL (ref 8–27)
CO2: 25 mmol/L (ref 20–29)
Calcium: 9.8 mg/dL (ref 8.7–10.3)
Chloride: 102 mmol/L (ref 96–106)
Creatinine, Ser: 1.16 mg/dL — ABNORMAL HIGH (ref 0.57–1.00)
GFR calc Af Amer: 53 mL/min/{1.73_m2} — ABNORMAL LOW (ref >59)
GFR calc non Af Amer: 46 mL/min/{1.73_m2} — ABNORMAL LOW (ref >59)
Glucose: 102 mg/dL — ABNORMAL HIGH (ref 65–99)
Potassium: 4.5 mmol/L (ref 3.5–5.2)
Sodium: 142 mmol/L (ref 134–144)

## 2019-09-20 ENCOUNTER — Telehealth: Payer: Self-pay

## 2019-09-20 NOTE — Telephone Encounter (Signed)
Mailed patient labs per her request.   Benedict Needy, CMA

## 2019-10-08 ENCOUNTER — Other Ambulatory Visit: Payer: Self-pay

## 2019-10-08 DIAGNOSIS — E118 Type 2 diabetes mellitus with unspecified complications: Secondary | ICD-10-CM

## 2019-10-08 MED ORDER — GLUCOSE BLOOD VI STRP: ORAL_STRIP | 12 refills | Status: DC

## 2019-10-19 DIAGNOSIS — E785 Hyperlipidemia, unspecified: Secondary | ICD-10-CM | POA: Diagnosis not present

## 2019-10-19 DIAGNOSIS — I2510 Atherosclerotic heart disease of native coronary artery without angina pectoris: Secondary | ICD-10-CM | POA: Diagnosis not present

## 2019-10-19 DIAGNOSIS — I700 Atherosclerosis of aorta: Secondary | ICD-10-CM | POA: Diagnosis not present

## 2019-10-19 DIAGNOSIS — I10 Essential (primary) hypertension: Secondary | ICD-10-CM | POA: Diagnosis not present

## 2019-10-26 ENCOUNTER — Ambulatory Visit: Payer: Medicare Other | Attending: Internal Medicine

## 2019-10-26 DIAGNOSIS — Z20822 Contact with and (suspected) exposure to covid-19: Secondary | ICD-10-CM

## 2019-10-28 LAB — NOVEL CORONAVIRUS, NAA: SARS-CoV-2, NAA: NOT DETECTED

## 2019-11-01 ENCOUNTER — Telehealth: Payer: Self-pay

## 2019-11-01 NOTE — Telephone Encounter (Signed)
Caller given negative result and verbalized understanding  

## 2019-11-08 ENCOUNTER — Ambulatory Visit: Payer: Medicare Other | Attending: Internal Medicine

## 2019-11-08 DIAGNOSIS — Z20822 Contact with and (suspected) exposure to covid-19: Secondary | ICD-10-CM | POA: Diagnosis not present

## 2019-11-09 LAB — NOVEL CORONAVIRUS, NAA: SARS-CoV-2, NAA: NOT DETECTED

## 2019-11-10 ENCOUNTER — Telehealth: Payer: Self-pay | Admitting: Hematology

## 2019-11-10 NOTE — Telephone Encounter (Signed)
Pt is aware covid 19 test is neg on 11/10/2019 

## 2019-11-23 ENCOUNTER — Other Ambulatory Visit: Payer: Self-pay | Admitting: Internal Medicine

## 2019-12-14 DIAGNOSIS — H401132 Primary open-angle glaucoma, bilateral, moderate stage: Secondary | ICD-10-CM | POA: Diagnosis not present

## 2019-12-14 LAB — HM DIABETES EYE EXAM

## 2019-12-27 ENCOUNTER — Encounter: Payer: Self-pay | Admitting: Internal Medicine

## 2019-12-30 ENCOUNTER — Ambulatory Visit
Admission: EM | Admit: 2019-12-30 | Discharge: 2019-12-30 | Disposition: A | Discharge status: Home / Self Care | Payer: Medicare Other | Attending: Family Medicine | Admitting: Family Medicine

## 2019-12-30 ENCOUNTER — Other Ambulatory Visit: Payer: Self-pay

## 2019-12-30 ENCOUNTER — Encounter: Payer: Self-pay | Admitting: Emergency Medicine

## 2019-12-30 DIAGNOSIS — R002 Palpitations: Secondary | ICD-10-CM | POA: Diagnosis not present

## 2019-12-30 DIAGNOSIS — F419 Anxiety disorder, unspecified: Secondary | ICD-10-CM | POA: Insufficient documentation

## 2019-12-30 DIAGNOSIS — Z01810 Encounter for preprocedural cardiovascular examination: Secondary | ICD-10-CM | POA: Diagnosis not present

## 2019-12-30 MED ORDER — HYDROXYZINE HCL 25 MG PO TABS: 12.5000 mg | ORAL_TABLET | Freq: Three times a day (TID) | ORAL | 0 refills | Status: DC | PRN

## 2019-12-30 NOTE — ED Provider Notes (Signed)
[facility]    CSN: [id number] Arrival date & time: 12/30/19  1238      History   Chief Complaint Chief Complaint  Patient presents with  . Palpitations   HPI  79 year old female presents with the above complaint.  Patient states that symptoms started last night.  She states that she has a "nervous feeling" in her chest.  She states that she feels like she is having palpitations.  Denies chest pain.  No shortness of breath.  No dizziness.  She states that she feels anxious.  No relieving factors.  No reports of diaphoresis.  No known exacerbating factors.  No other associated symptoms.  No other complaints.  Past Medical History:  Diagnosis Date  . Allergy   . Anxiety   . Diabetes mellitus without complication ([place])   . Glaucoma   . Hyperlipidemia   . Hypertension     Patient Active Problem List   Diagnosis Date Noted  . Osteopenia determined by x-ray 08/05/2019  . Atherosclerosis of abdominal aorta ([place]) 06/09/2019  . Coronary artery disease involving native coronary artery of native heart 06/09/2019  . BMI 24.0-24.9, adult 11/06/2018  . Bilateral neck pain 10/08/2018  . Aortic calcification ([place]) 03/05/2018  . 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 Ventral hernia without obstruction or gangrene 03/06/2016  . Type II diabetes mellitus with complication ([place]) [id number]  . Plantar fasciitis, right 06/16/2015  . Knee torn cartilage 04/26/2015  . Allergic rhinitis, seasonal 04/26/2015  . Essential (primary) hypertension 04/26/2015  . Glaucoma 04/26/2015  . Hyperlipidemia associated with type 2 diabetes mellitus ([place]) 04/26/2015  . Insomnia 04/26/2015    Past Surgical History:  Procedure Laterality Date  . ABDOMINAL HYSTERECTOMY    . COLONOSCOPY WITH PROPOFOL N/A 11/25/2017   Procedure: COLONOSCOPY WITH PROPOFOL;  Surgeon: [name], MD;  Location: [facility] ENDOSCOPY;  Service: Endoscopy;  Laterality: N/A;  . LIPOMA RESECTION     right posterior neck     OB History   No obstetric history on file.      Home Medications    Prior to Admission medications   Medication Sig Start Date End Date Taking? Authorizing Provider  amLODipine (NORVASC) 5 MG tablet TAKE 1 TABLET BY MOUTH EVERY DAY 11/23/19  Yes Glean Hess, MD  atorvastatin (LIPITOR) 80 MG tablet TAKE 1 TABLET BY MOUTH EVERY DAY 06/19/19  Yes Glean Hess, MD  B-D UF III MINI PEN NEEDLES 31G X 5 MM MISC USE AS DIRECTED WITH LEVEMIR 08/11/19  Yes Glean Hess, MD  cholecalciferol (VITAMIN D) 1000 units tablet Take 2,000 Units by mouth daily.   Yes [provider]  ezetimibe (ZETIA) 10 MG tablet Take 10 mg by mouth daily. 06/09/19  Yes [provider]  glipiZIDE (GLUCOTROL) 5 MG tablet TAKE 1 TABLET BY MOUTH TWICE A DAY 06/30/19  Yes Glean Hess, MD  glucose blood test strip Use to test blood sugar up to twice daily. 10/08/19  Yes Glean Hess, MD  Insulin Detemir (LEVEMIR FLEXTOUCH) 100 UNIT/ML Pen Inject 30 Units into the skin daily. 06/22/19  Yes Glean Hess, MD  JANUVIA 100 MG tablet TAKE 1 TABLET BY MOUTH EVERY DAY 02/26/19  Yes Glean Hess, MD  lisinopril (ZESTRIL) 40 MG tablet TAKE 1 TABLET BY MOUTH EVERY DAY 07/31/19  Yes Glean Hess, MD  LUMIGAN 0.01 % SOLN TAKE 1 DROP(S) IN BOTH EYES ONCE IN THE EVENING 03/01/15  Yes [provider]  pioglitazone (  ACTOS) 15 MG tablet TAKE 1 TABLET BY MOUTH EVERY DAY 02/26/19  Yes Glean Hess, MD  timolol (TIMOPTIC) 0.5 % ophthalmic solution Place 1 drop into both eyes. 02/15/16  Yes [provider]  traZODone (DESYREL) 50 MG tablet TAKE 1 TABLET BY MOUTH EVERYDAY AT BEDTIME 06/30/19  Yes Glean Hess, MD  hydrOXYzine (ATARAX/VISTARIL) 25 MG tablet Take 0.5-1 tablets (12.5-25 mg total) by mouth every 8 (eight) hours as needed for anxiety. 12/30/19   Coral Spikes, DO  albuterol (PROAIR HFA) 108 (90 Base) MCG/ACT inhaler Inhale 2 puffs into the lungs every 6 (six) hours  as needed for wheezing or shortness of breath. 02/18/19 12/30/19  Glean Hess, MD    Family History Family History  Problem Relation Age of Onset  . Cancer Mother        Breast  . Breast cancer Mother   . Heart disease Father   . Heart attack Brother   . Cancer Maternal Aunt        breast  . Breast cancer Maternal Aunt   . Breast cancer Cousin        mat cousin    Social History Social History   Tobacco Use  . Smoking status: Former Smoker    Packs/day: 1.00    Years: 7.00    Pack years: 7.00    Types: Cigarettes    Quit date: 1965    Years since quitting: 56.1  . Smokeless tobacco: Never Used  . Tobacco comment: smoking cessation materials not required  Substance Use Topics  . Alcohol use: No  . Drug use: No     Allergies   Amoxicillin, Aspirin, Metformin and related, Oxycodone, and Sulfa antibiotics   Review of Systems Review of Systems  Constitutional: Positive for diaphoresis.  Respiratory: Negative.   Cardiovascular: Positive for palpitations.  Psychiatric/Behavioral: The patient is nervous/anxious.    Physical Exam Triage Vital Signs ED Triage Vitals  Enc Vitals Group     BP 12/30/19 1248 (!) 141/70     Pulse Rate 12/30/19 1248 71     Resp 12/30/19 1248 18     Temp 12/30/19 1248 98.2 F (36.8 C)     Temp Source 12/30/19 1248 Oral     SpO2 12/30/19 1248 100 %     Weight 12/30/19 1245 174 lb (78.9 kg)     Height 12/30/19 1245 5\' 9"  (1.753 m)     Head Circumference --      Peak Flow --      Pain Score 12/30/19 1245 0     Pain Loc --      Pain Edu? --      Excl. in [place]? --    Updated Vital Signs BP (!) 141/70 (BP Location: Left Arm)   Pulse 71   Temp 98.2 F (36.8 C) (Oral)   Resp 18   Ht 5\' 9"  (1.753 m)   Wt 78.9 kg   SpO2 100%   BMI 25.70 kg/m   Visual Acuity Right Eye Distance:   Left Eye Distance:   Bilateral Distance:    Right Eye Near:   Left Eye Near:    Bilateral Near:     Physical Exam   UC Treatments / Results   Labs (all labs ordered are listed, but only abnormal results are displayed) Labs Reviewed - No data to display  EKG Interpretation: Normal sinus rhythm at the rate of 67.  Normal axis.  Normal intervals.  No significant ST  or T wave changes.  Normal EKG.  No changes when compared to EKG on 05/12/19.  Review of prior EKG on 05/12/19.  Independent interpretation: Normal sinus rhythm with a rate of 62.  Normal axis.  Normal intervals.  No ST or T wave changes.  Normal EKG.  Radiology No results found.  Procedures Procedures (including critical care time)  Medications Ordered in UC Medications - No data to display  Initial Impression / Assessment and Plan / UC Course  I have reviewed the triage vital signs and the nursing notes.  Pertinent labs & imaging results that were available during my care of the patient were reviewed by me and considered in my medical decision making (see chart for details).    79 year old female presents with palpitations and associated anxiety.  EKG today unremarkable.  EKG was compared to prior on 05/12/2019.  This EKG was independently reviewed as above.  No changes.  No evidence of ischemia.  Patient states that she is quite anxious.  Treating anxiety with hydroxyzine.  Advise follow-up with cardiology.  Supportive care.  Final Clinical Impressions(s) / UC Diagnoses   Final diagnoses:  Palpitations  Anxiety     Discharge Instructions     If persists, see your Cardiologist.  Medication as needed for anxiety. Use sparingly.  Take care  Dr. Lacinda Axon    ED Prescriptions    Medication Sig Dispense Auth. Provider   hydrOXYzine (ATARAX/VISTARIL) 25 MG tablet Take 0.5-1 tablets (12.5-25 mg total) by mouth every 8 (eight) hours as needed for anxiety. 30 tablet Coral Spikes, DO     PDMP not reviewed this encounter.   Coral Spikes, [place] 12/30/19 1353

## 2019-12-30 NOTE — Discharge Instructions (Signed)
If persists, see your Cardiologist.  Medication as needed for anxiety. Use sparingly.  Take care  Dr. Lacinda Axon

## 2019-12-30 NOTE — ED Triage Notes (Signed)
Pt c/o palpations or a nervous feeling in her chest. Denies pain or discomfort. Started last night but felt worse today. denies shortness of breath or dizziness.

## 2020-01-07 ENCOUNTER — Other Ambulatory Visit: Payer: Self-pay

## 2020-01-07 ENCOUNTER — Other Ambulatory Visit: Payer: Self-pay | Admitting: Internal Medicine

## 2020-02-20 ENCOUNTER — Other Ambulatory Visit: Payer: Self-pay | Admitting: Internal Medicine

## 2020-02-24 ENCOUNTER — Other Ambulatory Visit: Payer: Self-pay | Admitting: Internal Medicine

## 2020-02-24 DIAGNOSIS — Z794 Long term (current) use of insulin: Secondary | ICD-10-CM

## 2020-02-24 DIAGNOSIS — E119 Type 2 diabetes mellitus without complications: Secondary | ICD-10-CM

## 2020-02-24 NOTE — Telephone Encounter (Signed)
Appointment 7/13- RF per protocol

## 2020-02-29 DIAGNOSIS — L600 Ingrowing nail: Secondary | ICD-10-CM | POA: Diagnosis not present

## 2020-02-29 DIAGNOSIS — E119 Type 2 diabetes mellitus without complications: Secondary | ICD-10-CM | POA: Diagnosis not present

## 2020-02-29 DIAGNOSIS — M79674 Pain in right toe(s): Secondary | ICD-10-CM | POA: Diagnosis not present

## 2020-02-29 DIAGNOSIS — B351 Tinea unguium: Secondary | ICD-10-CM | POA: Diagnosis not present

## 2020-02-29 DIAGNOSIS — L03031 Cellulitis of right toe: Secondary | ICD-10-CM | POA: Diagnosis not present

## 2020-03-14 DIAGNOSIS — L600 Ingrowing nail: Secondary | ICD-10-CM | POA: Diagnosis not present

## 2020-03-14 DIAGNOSIS — B351 Tinea unguium: Secondary | ICD-10-CM | POA: Diagnosis not present

## 2020-03-14 DIAGNOSIS — M79672 Pain in left foot: Secondary | ICD-10-CM | POA: Diagnosis not present

## 2020-03-14 DIAGNOSIS — E119 Type 2 diabetes mellitus without complications: Secondary | ICD-10-CM | POA: Diagnosis not present

## 2020-05-02 DIAGNOSIS — E782 Mixed hyperlipidemia: Secondary | ICD-10-CM | POA: Diagnosis not present

## 2020-05-02 DIAGNOSIS — R600 Localized edema: Secondary | ICD-10-CM | POA: Diagnosis not present

## 2020-05-02 DIAGNOSIS — I2510 Atherosclerotic heart disease of native coronary artery without angina pectoris: Secondary | ICD-10-CM | POA: Diagnosis not present

## 2020-05-02 DIAGNOSIS — I10 Essential (primary) hypertension: Secondary | ICD-10-CM | POA: Diagnosis not present

## 2020-05-02 DIAGNOSIS — I700 Atherosclerosis of aorta: Secondary | ICD-10-CM | POA: Diagnosis not present

## 2020-05-16 ENCOUNTER — Encounter: Payer: Self-pay | Admitting: Internal Medicine

## 2020-05-16 ENCOUNTER — Other Ambulatory Visit: Payer: Self-pay

## 2020-05-16 ENCOUNTER — Ambulatory Visit (INDEPENDENT_AMBULATORY_CARE_PROVIDER_SITE_OTHER): Payer: Medicare Other | Admitting: Internal Medicine

## 2020-05-16 VITALS — BP 132/78 | HR 69 | Temp 98.8°F | Ht 69.0 in | Wt 180.0 lb

## 2020-05-16 DIAGNOSIS — E1169 Type 2 diabetes mellitus with other specified complication: Secondary | ICD-10-CM

## 2020-05-16 DIAGNOSIS — Z0000 Encounter for general adult medical examination without abnormal findings: Secondary | ICD-10-CM | POA: Diagnosis not present

## 2020-05-16 DIAGNOSIS — E785 Hyperlipidemia, unspecified: Secondary | ICD-10-CM | POA: Diagnosis not present

## 2020-05-16 DIAGNOSIS — E118 Type 2 diabetes mellitus with unspecified complications: Secondary | ICD-10-CM

## 2020-05-16 DIAGNOSIS — I10 Essential (primary) hypertension: Secondary | ICD-10-CM

## 2020-05-16 DIAGNOSIS — Z1231 Encounter for screening mammogram for malignant neoplasm of breast: Secondary | ICD-10-CM | POA: Diagnosis not present

## 2020-05-16 DIAGNOSIS — I700 Atherosclerosis of aorta: Secondary | ICD-10-CM | POA: Diagnosis not present

## 2020-05-16 DIAGNOSIS — Z1159 Encounter for screening for other viral diseases: Secondary | ICD-10-CM

## 2020-05-16 LAB — POCT URINALYSIS DIPSTICK
Bilirubin, UA: NEGATIVE
Blood, UA: NEGATIVE
Glucose, UA: NEGATIVE
Ketones, UA: NEGATIVE
Nitrite, UA: NEGATIVE
Protein, UA: POSITIVE — AB
Spec Grav, UA: 1.02 (ref 1.010–1.025)
Urobilinogen, UA: 0.2 E.U./dL
pH, UA: 6 (ref 5.0–8.0)

## 2020-05-16 NOTE — Progress Notes (Signed)
Date:  05/16/2020   Name:  Cathy Murphy   DOB:  1941/11/02   MRN:  [id number]   Chief Complaint: Annual Exam (breast exam no pap) and hunger (stays hungry all the time X2-3 months)  Cathy Murphy is a 79 y.o. female who presents today for her Complete Annual Exam. She feels well. She reports exercising none. She reports she is sleeping fairly well. Breast complaints none.  Mammogram: 07/2019 DEXA:07/2019 osteopenia Pap smear:discontinued Colonoscopy:11/2017  Immunization History  Administered Date(s) Administered  . Influenza-Unspecified 03/06/2016  . Td 03/05/2011    Diabetes She presents for her follow-up diabetic visit. She has type 2 diabetes mellitus. Her disease course has been stable. Pertinent negatives for hypoglycemia include no dizziness, headaches, nervousness/anxiousness or tremors. Pertinent negatives for diabetes include no chest pain, no fatigue, no polydipsia and no polyuria. Current diabetic treatment includes oral agent (triple therapy) (actos, januvia, glipizide and insulin). She participates in exercise intermittently. She monitors blood glucose at home 1-2 x per day. Her breakfast blood glucose is taken between 7-8 am. Her breakfast blood glucose range is generally 90-110 mg/dl. An ACE inhibitor/angiotensin II receptor blocker is being taken. Eye exam is current.  Hypertension This is a chronic problem. The problem is controlled. Pertinent negatives include no chest pain, headaches, palpitations or shortness of breath. Past treatments include ACE inhibitors (stopped amlodipine). The current treatment provides no improvement. Compliance problems: edema from norvasc.   Hyperlipidemia This is a chronic problem. The problem is controlled. Pertinent negatives include no chest pain or shortness of breath. Current antihyperlipidemic treatment includes statins and ezetimibe. The current treatment provides significant improvement of lipids.    Lab Results  Component Value  Date   CREATININE 1.16 (H) 09/14/2019   BUN 16 09/14/2019   NA 142 09/14/2019   K 4.5 09/14/2019   CL 102 09/14/2019   CO2 25 09/14/2019   Lab Results  Component Value Date   CHOL 178 09/14/2019   HDL 59 09/14/2019   LDLCALC 103 (H) 09/14/2019   TRIG 90 09/14/2019   CHOLHDL 3.0 09/14/2019   Lab Results  Component Value Date   TSH 1.370 05/12/2019   Lab Results  Component Value Date   HGBA1C 7.4 (H) 09/14/2019   Lab Results  Component Value Date   WBC 6.0 05/12/2019   HGB 13.9 05/12/2019   HCT 41.6 05/12/2019   MCV 87 05/12/2019   PLT 186 05/12/2019   Lab Results  Component Value Date   ALT 15 05/12/2019   AST 13 05/12/2019   ALKPHOS 86 05/12/2019   BILITOT 1.4 (H) 05/12/2019     Review of Systems  Constitutional: Negative for chills, fatigue and fever.  HENT: Negative for congestion, hearing loss, tinnitus, trouble swallowing and voice change.   Eyes: Negative for visual disturbance.  Respiratory: Negative for cough, chest tightness, shortness of breath and wheezing.   Cardiovascular: Positive for leg swelling (every week was taken off of norvasc and got better). Negative for chest pain and palpitations.  Gastrointestinal: Negative for abdominal pain, constipation, diarrhea and vomiting.  Endocrine: Negative for polydipsia and polyuria.  Genitourinary: Negative for dysuria, frequency, genital sores, vaginal bleeding and vaginal discharge.  Musculoskeletal: Negative for arthralgias, gait problem and joint swelling.  Skin: Negative for color change and rash.  Neurological: Negative for dizziness, tremors, light-headedness and headaches.  Hematological: Negative for adenopathy. Does not bruise/bleed easily.  Psychiatric/Behavioral: Negative for dysphoric mood and sleep disturbance. The patient is not nervous/anxious.  Patient Active Problem List   Diagnosis Date Noted  . Osteopenia determined by x-ray 08/05/2019  . Atherosclerosis of abdominal aorta ([place])  06/09/2019  . Coronary artery disease involving native coronary artery of native heart 06/09/2019  . BMI 24.0-24.9, adult 11/06/2018  . Bilateral neck pain 10/08/2018  . Aortic calcification ([place]) 03/05/2018  . 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 Ventral hernia without obstruction or gangrene 03/06/2016  . Type II diabetes mellitus with complication ([place]) 84/13/2440  . Plantar fasciitis, right 06/16/2015  . Knee torn cartilage 04/26/2015  . Allergic rhinitis, seasonal 04/26/2015  . Essential (primary) hypertension 04/26/2015  . Glaucoma 04/26/2015  . Hyperlipidemia associated with type 2 diabetes mellitus ([place]) 04/26/2015  . Insomnia 04/26/2015    Allergies  Allergen Reactions  . Amoxicillin Diarrhea  . Aspirin Nausea And Vomiting  . Metformin And Related Diarrhea  . Oxycodone Other (See Comments)    CNS side effects/agitation  . Sulfa Antibiotics     Past Surgical History:  Procedure Laterality Date  . ABDOMINAL HYSTERECTOMY    . COLONOSCOPY WITH PROPOFOL N/A 11/25/2017   Procedure: COLONOSCOPY WITH PROPOFOL;  Surgeon: [name], MD;  Location[facility] ENDOSCOPY;  Service: Endoscopy;  Laterality: N/A;  . LIPOMA RESECTION     right posterior neck    Social History   Tobacco Use  . Smoking status: Former Smoker    Packs/day: 1.00    Years: 7.00    Pack years: 7.00    Types: Cigarettes    Quit date: 1965    Years since quitting: 56.5  . Smokeless tobacco: Never Used  . Tobacco comment: smoking cessation materials not required  Vaping Use  . Vaping Use: Never used  Substance Use Topics  . Alcohol use: No  . Drug use: No     Medication list has been reviewed and updated.  Current Meds  Medication Sig  . atorvastatin (LIPITOR) 80 MG tablet TAKE 1 TABLET BY MOUTH EVERY DAY  . B-D UF III MINI PEN NEEDLES 31G X 5 MM MISC USE AS DIRECTED WITH LEVEMIR  . cholecalciferol (VITAMIN D) 1000 units tablet Take 2,000 Units by mouth daily.  Marland Kitchen ezetimibe (ZETIA) 10 MG  tablet Take 10 mg by mouth daily.  Marland Kitchen glipiZIDE (GLUCOTROL) 5 MG tablet TAKE 1 TABLET BY MOUTH TWICE A DAY  . glucose blood test strip Use to test blood sugar up to twice daily.  . hydrOXYzine (ATARAX/VISTARIL) 25 MG tablet Take 0.5-1 tablets (12.5-25 mg total) by mouth every 8 (eight) hours as needed for anxiety.  Marland Kitchen JANUVIA 100 MG tablet TAKE 1 TABLET BY MOUTH EVERY DAY  . LEVEMIR FLEXTOUCH 100 UNIT/ML FlexPen INJECT 30 UNITS INTO THE SKIN DAILY.  Marland Kitchen lisinopril (ZESTRIL) 40 MG tablet TAKE 1 TABLET BY MOUTH EVERY DAY  . LUMIGAN 0.01 % SOLN TAKE 1 DROP(S) IN BOTH EYES ONCE IN THE EVENING  . pioglitazone (ACTOS) 15 MG tablet TAKE 1 TABLET BY MOUTH EVERY DAY  . timolol (TIMOPTIC) 0.5 % ophthalmic solution Place 1 drop into both eyes.  . traZODone (DESYREL) 50 MG tablet TAKE 1 TABLET BY MOUTH EVERYDAY AT BEDTIME    PHQ 2/9 Scores 05/16/2020 09/14/2019 07/07/2019 05/12/2019  PHQ - 2 Score 0 0 2 0  PHQ- 9 Score 0 - 4 6    GAD 7 : Generalized Anxiety Score 05/16/2020 02/18/2019  Nervous, Anxious, on Edge 1 0  Control/stop worrying 1 3  Worry too much - different things 1 3  Trouble relaxing 1  2  Restless 0 1  Easily annoyed or irritable 1 0  Afraid - awful might happen 0 2  Total GAD 7 Score 5 11  Anxiety Difficulty Not difficult at all Somewhat difficult    BP Readings from Last 3 Encounters:  05/16/20 132/78  12/30/19 (!) 141/70  09/14/19 120/78    Physical Exam Vitals and nursing note reviewed.  Constitutional:      General: She is not in acute distress.    Appearance: She is well-developed.  HENT:     Head: Normocephalic and atraumatic.     Right Ear: Tympanic membrane and ear canal normal.     Left Ear: Tympanic membrane and ear canal normal.     Nose:     Right Sinus: No maxillary sinus tenderness.     Left Sinus: No maxillary sinus tenderness.  Eyes:     General: No scleral icterus.       Right eye: No discharge.        Left eye: No discharge.     Conjunctiva/sclera:  Conjunctivae normal.  Neck:     Thyroid: No thyromegaly.     Vascular: No carotid bruit.  Cardiovascular:     Rate and Rhythm: Normal rate and regular rhythm.     Pulses: Normal pulses.     Heart sounds: Normal heart sounds.  Pulmonary:     Effort: Pulmonary effort is normal. No respiratory distress.     Breath sounds: No wheezing.  Chest:     Breasts:        Right: No mass, nipple discharge, skin change or tenderness.        Left: No mass, nipple discharge, skin change or tenderness.  Abdominal:     General: Bowel sounds are normal.     Palpations: Abdomen is soft.     Tenderness: There is no abdominal tenderness.  Musculoskeletal:     Cervical back: Normal range of motion. No erythema.     Right lower leg: No edema.     Left lower leg: No edema.  Lymphadenopathy:     Cervical: No cervical adenopathy.  Skin:    General: Skin is warm and dry.     Findings: No rash.  Neurological:     Mental Status: She is alert and oriented to person, place, and time.     Cranial Nerves: No cranial nerve deficit.     Sensory: No sensory deficit.     Deep Tendon Reflexes: Reflexes are normal and symmetric.  Psychiatric:        Attention and Perception: Attention normal.        Mood and Affect: Mood normal.     Wt Readings from Last 3 Encounters:  05/16/20 180 lb (81.6 kg)  12/30/19 174 lb (78.9 kg)  09/14/19 181 lb (82.1 kg)    BP 132/78   Pulse 69   Temp 98.8 F (37.1 C) (Oral)   Ht 5\' 9"  (1.753 m)   Wt 180 lb (81.6 kg)   SpO2 98%   BMI 26.58 kg/m   Assessment and Plan: 1. Annual physical exam Normal exam Continue healthy diet, work on exercise - POCT urinalysis dipstick  2. Encounter for screening mammogram for breast cancer Schedule mammogram - MM 3D SCREEN BREAST BILATERAL; Future  3. Need for hepatitis C screening test - Hepatitis C antibody  4. Essential (primary) hypertension Clinically stable exam with well controlled BP. Tolerating medications without side  effects at this time. Pt to continue current regimen  and low sodium diet; benefits of regular exercise as able discussed. - CBC with Differential/Platelet - TSH  5. Type II diabetes mellitus with complication (HCC) Clinically stable by exam and report without s/s of hypoglycemia. DM complicated by HTN. Tolerating medications well without side effects or other concerns. - Comprehensive metabolic panel - Hemoglobin A1c  6. Hyperlipidemia associated with type 2 diabetes mellitus ([place]) Tolerating statin medication without side effects at this time LDL is not quite at goal of < 70 on current dose Continue same therapy without change at this time. - Lipid panel  7. Aortic calcification (HCC) On appropriate statin therapy   Partially dictated using Editor, commissioning. Any errors are unintentional.  Halina Maidens, MD [facility]  05/16/2020

## 2020-05-17 LAB — CBC WITH DIFFERENTIAL/PLATELET
Basophils Absolute: 0 10*3/uL (ref 0.0–0.2)
Basos: 1 %
EOS (ABSOLUTE): 0.1 10*3/uL (ref 0.0–0.4)
Eos: 2 %
Hematocrit: 46 % (ref 34.0–46.6)
Hemoglobin: 14.7 g/dL (ref 11.1–15.9)
Immature Grans (Abs): 0 10*3/uL (ref 0.0–0.1)
Immature Granulocytes: 1 %
Lymphocytes Absolute: 1.4 10*3/uL (ref 0.7–3.1)
Lymphs: 26 %
MCH: 28.9 pg (ref 26.6–33.0)
MCHC: 32 g/dL (ref 31.5–35.7)
MCV: 91 fL (ref 79–97)
Monocytes Absolute: 0.3 10*3/uL (ref 0.1–0.9)
Monocytes: 6 %
Neutrophils Absolute: 3.4 10*3/uL (ref 1.4–7.0)
Neutrophils: 64 %
Platelets: 211 10*3/uL (ref 150–450)
RBC: 5.08 x10E6/uL (ref 3.77–5.28)
RDW: 12.8 % (ref 11.7–15.4)
WBC: 5.3 10*3/uL (ref 3.4–10.8)

## 2020-05-17 LAB — COMPREHENSIVE METABOLIC PANEL
ALT: 12 IU/L (ref 0–32)
AST: 13 IU/L (ref 0–40)
Albumin/Globulin Ratio: 1.9 (ref 1.2–2.2)
Albumin: 4.5 g/dL (ref 3.7–4.7)
Alkaline Phosphatase: 88 IU/L (ref 48–121)
BUN/Creatinine Ratio: 14 (ref 12–28)
BUN: 15 mg/dL (ref 8–27)
Bilirubin Total: 1.3 mg/dL — ABNORMAL HIGH (ref 0.0–1.2)
CO2: 27 mmol/L (ref 20–29)
Calcium: 9.6 mg/dL (ref 8.7–10.3)
Chloride: 100 mmol/L (ref 96–106)
Creatinine, Ser: 1.06 mg/dL — ABNORMAL HIGH (ref 0.57–1.00)
GFR calc Af Amer: 59 mL/min/{1.73_m2} — ABNORMAL LOW (ref >59)
GFR calc non Af Amer: 51 mL/min/{1.73_m2} — ABNORMAL LOW (ref >59)
Globulin, Total: 2.4 g/dL (ref 1.5–4.5)
Glucose: 121 mg/dL — ABNORMAL HIGH (ref 65–99)
Potassium: 4.2 mmol/L (ref 3.5–5.2)
Sodium: 140 mmol/L (ref 134–144)
Total Protein: 6.9 g/dL (ref 6.0–8.5)

## 2020-05-17 LAB — LIPID PANEL
Chol/HDL Ratio: 3.2 ratio (ref 0.0–4.4)
Cholesterol, Total: 175 mg/dL (ref 100–199)
HDL: 55 mg/dL (ref >39)
LDL Chol Calc (NIH): 103 mg/dL — ABNORMAL HIGH (ref 0–99)
Triglycerides: 92 mg/dL (ref 0–149)
VLDL Cholesterol Cal: 17 mg/dL (ref 5–40)

## 2020-05-17 LAB — HEMOGLOBIN A1C
Est. average glucose Bld gHb Est-mCnc: 183 mg/dL
Hgb A1c MFr Bld: 8 % — ABNORMAL HIGH (ref 4.8–5.6)

## 2020-05-17 LAB — HEPATITIS C ANTIBODY: Hep C Virus Ab: <0.1 s/co ratio (ref 0.0–0.9)

## 2020-05-17 LAB — TSH: TSH: 2.87 u[IU]/mL (ref 0.450–4.500)

## 2020-05-18 NOTE — Progress Notes (Signed)
Called pt with labs diabetes increased from last labs to a 8.0. Kidney function is stable to work on diet and continue to take same medications. All other labs normal. Pt verbalized understanding.  KP

## 2020-05-19 ENCOUNTER — Other Ambulatory Visit: Payer: Self-pay | Admitting: Internal Medicine

## 2020-05-19 DIAGNOSIS — Z794 Long term (current) use of insulin: Secondary | ICD-10-CM

## 2020-06-20 DIAGNOSIS — H401132 Primary open-angle glaucoma, bilateral, moderate stage: Secondary | ICD-10-CM | POA: Diagnosis not present

## 2020-06-24 ENCOUNTER — Other Ambulatory Visit: Payer: Self-pay | Admitting: Internal Medicine

## 2020-06-24 NOTE — Telephone Encounter (Signed)
Requested Prescriptions  Pending Prescriptions Disp Refills  . atorvastatin (LIPITOR) 80 MG tablet [Pharmacy Med Name: ATORVASTATIN 80 MG TABLET] 90 tablet 3    Sig: TAKE 1 TABLET BY MOUTH EVERY DAY     Cardiovascular:  Antilipid - Statins Failed - 06/24/2020 12:46 AM      Failed - LDL in normal range and within 360 days    LDL Chol Calc (NIH)  Date Value Ref Range Status  05/16/2020 103 (H) 0 - 99 mg/dL Final         Passed - Total Cholesterol in normal range and within 360 days    Cholesterol, Total  Date Value Ref Range Status  05/16/2020 175 100 - 199 mg/dL Final         Passed - HDL in normal range and within 360 days    HDL  Date Value Ref Range Status  05/16/2020 55 >39 mg/dL Final         Passed - Triglycerides in normal range and within 360 days    Triglycerides  Date Value Ref Range Status  05/16/2020 92 0 - 149 mg/dL Final         Passed - Patient is not pregnant      Passed - Valid encounter within last 12 months    Recent Outpatient Visits          1 month ago Annual physical exam   [facility] Glean Hess, MD   9 months ago Essential (primary) hypertension   [facility] Glean Hess, MD   1 year ago Annual physical exam   [facility] Glean Hess, MD   1 year ago Skin lesions   [facility] Glean Hess, MD   1 year ago Mild intermittent reactive airway disease with wheezing without complication   [facility] Glean Hess, MD             . glipiZIDE ([place]) 5 MG tablet [Pharmacy Med Name: GLIPIZIDE 5 MG TABLET] 180 tablet 1    Sig: TAKE 1 TABLET BY MOUTH TWICE A DAY     Endocrinology:  Diabetes - Sulfonylureas Failed - 06/24/2020 12:46 AM      Failed - HBA1C is between 0 and 7.9 and within 180 days    Hgb A1c MFr Bld  Date Value Ref Range Status  05/16/2020 8.0 (H) 4.8 - 5.6 % Final    Comment:             Prediabetes: 5.7 - 6.4          Diabetes: >6.4           Glycemic control for adults with diabetes: <7.0          Passed - Valid encounter within last 6 months    Recent Outpatient Visits          1 month ago Annual physical exam   [facility] Glean Hess, MD   9 months ago Essential (primary) hypertension   [facility] Glean Hess, MD   1 year ago Annual physical exam   [facility] Glean Hess, MD   1 year ago Skin lesions   [facility] Glean Hess, MD   1 year ago Mild intermittent reactive airway disease with wheezing without complication   [facility] Glean Hess, MD

## 2020-06-27 DIAGNOSIS — I700 Atherosclerosis of aorta: Secondary | ICD-10-CM | POA: Diagnosis not present

## 2020-06-27 DIAGNOSIS — I2510 Atherosclerotic heart disease of native coronary artery without angina pectoris: Secondary | ICD-10-CM | POA: Diagnosis not present

## 2020-06-27 DIAGNOSIS — E782 Mixed hyperlipidemia: Secondary | ICD-10-CM | POA: Diagnosis not present

## 2020-06-27 DIAGNOSIS — I10 Essential (primary) hypertension: Secondary | ICD-10-CM | POA: Diagnosis not present

## 2020-07-12 ENCOUNTER — Other Ambulatory Visit: Payer: Self-pay | Admitting: Internal Medicine

## 2020-07-12 ENCOUNTER — Ambulatory Visit (INDEPENDENT_AMBULATORY_CARE_PROVIDER_SITE_OTHER): Payer: Medicare Other

## 2020-07-12 DIAGNOSIS — Z0000 Encounter for general adult medical examination without abnormal findings: Secondary | ICD-10-CM

## 2020-07-12 MED ORDER — HYDROXYZINE HCL 25 MG PO TABS: 12.5000 mg | ORAL_TABLET | Freq: Three times a day (TID) | ORAL | 0 refills | Status: DC | PRN

## 2020-07-12 NOTE — Progress Notes (Signed)
Subjective:   Cathy Murphy is a 79 y.o. female who presents for Medicare Annual (Subsequent) preventive examination.   Virtual Visit via Telephone Note  I connected with  [name] on 07/12/20 at 10:00 AM EDT by telephone and verified that I am speaking with the correct person using two identifiers.  Medicare Annual Wellness visit completed telephonically due to Covid-19 pandemic.   Location: Patient: home Provider: [facility]   I discussed the limitations, risks, security and privacy concerns of performing an evaluation and management service by telephone and the availability of in person appointments. The patient expressed understanding and agreed to proceed.  Unable to perform video visit due to video visit attempted and failed and/or patient does not have video capability.   Some vital signs may be absent or patient reported.   Clemetine Marker, LPN    Review of Systems     Cardiac Risk Factors include: advanced age (>12men, >46 women);diabetes mellitus;dyslipidemia;hypertension     Objective:    Today's Vitals   07/12/20 1007  PainSc: 9    There is no height or weight on file to calculate BMI.  Advanced Directives 07/12/2020 12/30/2019 07/07/2019 05/25/2018 04/15/2018 03/05/2018 03/05/2018  Does Patient Have a Medical Advance Directive? No No No No No Yes Yes  Type of Advance Directive - - - - - Press photographer;Living will -  Does patient want to make changes to medical advance directive? - - - - Yes (MAU/Ambulatory/Procedural Areas - Information given) No - Patient declined No - Patient declined  Copy of [place] in Chart? - - - - - No - copy requested -  Would patient like information on creating a medical advance directive? No - Patient declined - Yes (MAU/Ambulatory/Procedural Areas - Information given) - - - -    Current Medications (verified) Outpatient Encounter Medications as of 07/12/2020  Medication Sig  . atorvastatin (LIPITOR) 80 MG  tablet TAKE 1 TABLET BY MOUTH EVERY DAY  . B-D UF III MINI PEN NEEDLES 31G X 5 MM MISC USE AS DIRECTED WITH LEVEMIR  . cholecalciferol (VITAMIN D) 1000 units tablet Take 2,000 Units by mouth daily.  . clindamycin (CLEOCIN) 150 MG capsule Take 150 mg by mouth every 8 (eight) hours.  Marland Kitchen ezetimibe (ZETIA) 10 MG tablet Take 10 mg by mouth daily.  Marland Kitchen glipiZIDE (GLUCOTROL) 5 MG tablet TAKE 1 TABLET BY MOUTH TWICE A DAY  . glucose blood test strip Use to test blood sugar up to twice daily.  Marland Kitchen JANUVIA 100 MG tablet TAKE 1 TABLET BY MOUTH EVERY DAY  . LEVEMIR FLEXTOUCH 100 UNIT/ML FlexPen INJECT 30 UNITS INTO THE SKIN DAILY.  Marland Kitchen lisinopril (ZESTRIL) 40 MG tablet TAKE 1 TABLET BY MOUTH EVERY DAY  . LUMIGAN 0.01 % SOLN TAKE 1 DROP(S) IN BOTH EYES ONCE IN THE EVENING  . pioglitazone (ACTOS) 15 MG tablet TAKE 1 TABLET BY MOUTH EVERY DAY  . spironolactone (ALDACTONE) 25 MG tablet Take 25 mg by mouth daily.  . timolol (TIMOPTIC) 0.5 % ophthalmic solution Place 1 drop into both eyes.  . traZODone (DESYREL) 50 MG tablet TAKE 1 TABLET BY MOUTH EVERYDAY AT BEDTIME  . hydrOXYzine (ATARAX/VISTARIL) 25 MG tablet Take 0.5-1 tablets (12.5-25 mg total) by mouth every 8 (eight) hours as needed for anxiety. (Patient not taking: Reported on 07/12/2020)  . [DISCONTINUED] albuterol (PROAIR HFA) 108 (90 Base) MCG/ACT inhaler Inhale 2 puffs into the lungs every 6 (six) hours as needed for wheezing or  shortness of breath.   No facility-administered encounter medications on file as of 07/12/2020.    Allergies (verified) Amoxicillin, Aspirin, Metformin and related, Oxycodone, and Sulfa antibiotics   History: Past Medical History:  Diagnosis Date  . Allergy   . Anxiety   . Diabetes mellitus without complication ([place])   . Glaucoma   . Hyperlipidemia   . Hypertension    Past Surgical History:  Procedure Laterality Date  . ABDOMINAL HYSTERECTOMY    . COLONOSCOPY WITH PROPOFOL N/A 11/25/2017   Procedure: COLONOSCOPY WITH  PROPOFOL;  Surgeon: [name], MD;  Location: [facility] ENDOSCOPY;  Service: Endoscopy;  Laterality: N/A;  . LIPOMA RESECTION     right posterior neck   Family History  Problem Relation Age of Onset  . Cancer Mother        Breast  . Breast cancer Mother   . Heart disease Father   . Heart attack Brother   . Cancer Maternal Aunt        breast  . Breast cancer Maternal Aunt   . Breast cancer Cousin        mat cousin   Social History   Socioeconomic History  . Marital status: Widowed    Spouse name: Not on file  . Number of children: 1  . Years of education: Not on file  . Highest education level: 12th grade  Occupational History    Employer: [employer]  Tobacco Use  . Smoking status: Former Smoker    Packs/day: 1.00    Years: 7.00    Pack years: 7.00    Types: Cigarettes    Quit date: 1965    Years since quitting: 56.7  . Smokeless tobacco: Never Used  . Tobacco comment: smoking cessation materials not required  Vaping Use  . Vaping Use: Never used  Substance and Sexual Activity  . Alcohol use: No  . Drug use: No  . Sexual activity: Never  Other Topics Concern  . Not on file  Social History Narrative   Pt lives alone.   Social Determinants of Health   Financial Resource Strain: Low Risk   . Difficulty of Paying Living Expenses: Not hard at all  Food Insecurity: No Food Insecurity  . Worried About Charity fundraiser in the Last Year: Never true  . Ran Out of Food in the Last Year: Never true  Transportation Needs: No Transportation Needs  . Lack of Transportation (Medical): No  . Lack of Transportation (Non-Medical): No  Physical Activity: Inactive  . Days of Exercise per Week: 0 days  . Minutes of Exercise per Session: 0 min  Stress: Stress Concern Present  . Feeling of Stress : To some extent  Social Connections: Socially Isolated  . Frequency of Communication with Friends and Family: More than three times a week  . Frequency of Social Gatherings with  Friends and Family: Three times a week  . Attends Religious Services: Never  . Active Member of Clubs or Organizations: No  . Attends Archivist Meetings: Never  . Marital Status: Widowed    Tobacco Counseling Counseling given: Not Answered Comment: smoking cessation materials not required   Clinical Intake:  Pre-visit preparation completed: Yes  Pain : 0-10 Pain Score: 9  Pain Type: Chronic pain Pain Location: Knee Pain Orientation: Right, Left Pain Descriptors / Indicators: Aching, Sore Pain Onset: More than a month ago Pain Frequency: Constant     Nutritional Risks: None Diabetes: Yes CBG done?: No Did pt. bring  in CBG monitor from home?: No  How often do you need to have someone help you when you read instructions, pamphlets, or other written materials from your doctor or pharmacy?: 1 - Never  Nutrition Risk Assessment:  Has the patient had any N/V/D within the last 2 months?  No  Does the patient have any non-healing wounds?  No  Has the patient had any unintentional weight loss or weight gain?  No   Diabetes:  Is the patient diabetic?  Yes  If diabetic, was a CBG obtained today?  No  Did the patient bring in their glucometer from home?  No  How often do you monitor your CBG's? daily.   Financial Strains and Diabetes Management:  Are you having any financial strains with the device, your supplies or your medication? No .  Does the patient want to be seen by Chronic Care Management for management of their diabetes?  No  Would the patient like to be referred to a Nutritionist or for Diabetic Management?  No   Diabetic Exams:  Diabetic Eye Exam: Completed 12/14/19 negative retinopathy.   Diabetic Foot Exam: Completed 05/16/20.   Interpreter Needed?: No  Information entered by :: Clemetine Marker LPN   Activities of Daily Living In your present state of health, do you have any difficulty performing the following activities: 07/12/2020  Hearing? N    Comment declines hearing aids  Vision? N  Difficulty concentrating or making decisions? N  Walking or climbing stairs? N  Dressing or bathing? N  Doing errands, shopping? N  Preparing Food and eating ? N  Using the Toilet? N  In the past six months, have you accidently leaked urine? N  Do you have problems with loss of bowel control? N  Managing your Medications? N  Managing your Finances? N  Housekeeping or managing your Housekeeping? N  Some recent data might be hidden    Patient Care Team: Glean Hess, MD as PCP - General (Internal Medicine) [facility] (Ophthalmology)  Indicate any recent Medical Services you may have received from other than [facility] providers in the past year (date may be approximate).     Assessment:   This is a routine wellness examination for [place].  Hearing/Vision screen  Hearing Screening   125Hz  250Hz  500Hz  1000Hz  2000Hz  3000Hz  4000Hz  6000Hz  8000Hz   Right ear:           Left ear:           Comments: Pt denies hearing difficulty  Vision Screening Comments: Annual vision screenings done at [facility]  Dietary issues and exercise activities discussed: Current Exercise Habits: The patient does not participate in regular exercise at present, Exercise limited by: orthopedic condition(s)  Goals    . DIET - INCREASE WATER INTAKE     Recommend to drink at least 6-8 8oz glasses of water per day.      Depression Screen PHQ 2/9 Scores 07/12/2020 05/16/2020 09/14/2019 07/07/2019 05/12/2019 04/28/2019 02/18/2019  PHQ - 2 Score 0 0 0 2 0 0 2  PHQ- 9 Score - 0 - 4 6 - 7    Fall Risk Fall Risk  07/12/2020 05/16/2020 07/07/2019 02/18/2019 11/06/2018  Falls in the past year? 0 0 1 0 0  Number falls in past yr: 0 - 1 0 0  Injury with Fall? 0 - 0 0 0  Risk for fall due to : No Fall Risks No Fall Risks - - -  Risk for fall due to:  Comment - - - - -  Follow up Falls prevention discussed Falls evaluation completed Falls prevention discussed Falls evaluation  completed -    Any stairs in or around the home? Yes  If so, are there any without handrails? No  Home free of loose throw rugs in walkways, pet beds, electrical cords, etc? Yes  Adequate lighting in your home to reduce risk of falls? Yes   ASSISTIVE DEVICES UTILIZED TO PREVENT FALLS:  Life alert? No  Use of a cane, walker or w/c? No  Grab bars in the bathroom? No  Shower chair or bench in shower? No  Elevated toilet seat or a handicapped toilet? Yes   TIMED UP AND GO:  Was the test performed? No . Telephonic visit.   Cognitive Function:     6CIT Screen 07/12/2020 07/07/2019 04/15/2018 04/07/2017  What Year? 0 points 0 points 0 points 0 points  What month? 0 points 0 points 0 points 0 points  What time? 0 points 0 points 0 points 0 points  Count back from 20 0 points 0 points 2 points 0 points  Months in reverse 0 points 0 points 2 points 0 points  Repeat phrase 0 points 0 points 2 points 0 points  Total Score 0 0 6 0    Immunizations Immunization History  Administered Date(s) Administered  . Influenza-Unspecified 03/06/2016  . Moderna SARS-COVID-2 Vaccination 01/01/2020, 01/29/2020  . Td 03/05/2011    TDAP status: Up to date   Flu Vaccine status: Declined, Education has been provided regarding the importance of this vaccine but patient still declined. Advised may receive this vaccine at local pharmacy or Health Dept. Aware to provide a copy of the vaccination record if obtained from local pharmacy or Health Dept. Verbalized acceptance and understanding.   Pneumococcal vaccine status: Declined,  Education has been provided regarding the importance of this vaccine but patient still declined. Advised may receive this vaccine at local pharmacy or Health Dept. Aware to provide a copy of the vaccination record if obtained from local pharmacy or Health Dept. Verbalized acceptance and understanding.    Covid-19 vaccine status: Completed vaccines  Qualifies for Shingles Vaccine? Yes    Zostavax completed No   Shingrix Completed?: No.    Education has been provided regarding the importance of this vaccine. Patient has been advised to call insurance company to determine out of pocket expense if they have not yet received this vaccine. Advised may also receive vaccine at local pharmacy or Health Dept. Verbalized acceptance and understanding.  Screening Tests Health Maintenance  Topic Date Due  . INFLUENZA VACCINE  06/04/2020  . PNA vac Low Risk Adult (1 of 2 - PCV13) 09/13/2020 (Originally 10/28/2008)  . MAMMOGRAM  08/03/2020  . HEMOGLOBIN A1C  11/16/2020  . OPHTHALMOLOGY EXAM  12/13/2020  . TETANUS/TDAP  03/04/2021  . FOOT EXAM  05/16/2021  . DEXA SCAN  Completed  . COVID-19 Vaccine  Completed  . Hepatitis C Screening  Completed    Health Maintenance  Health Maintenance Due  Topic Date Due  . INFLUENZA VACCINE  06/04/2020   Colorectal Cancer screening: Virtual colonoscopy completed 01/09/18. No longer required  Mammogram status: Completed 08/04/19 . Repeat every year   Bone Density status: Completed 08/04/19. Results reflect: Bone density results: OSTEOPENIA. Repeat every 2 years.  Lung Cancer Screening: (Low Dose CT Chest recommended if Age 16-80 years, 30 pack-year currently smoking OR have quit w/in 15years.) does not qualify.   Additional Screening:  Hepatitis C Screening:  does qualify; Completed 05/16/20  Vision Screening: Recommended annual ophthalmology exams for early detection of glaucoma and other disorders of the eye. Is the patient up to date with their annual eye exam?  Yes  Who is the provider or what is the name of the office in which the patient attends annual eye exams? [facility] Screening: Recommended annual dental exams for proper oral hygiene  Community Resource Referral / Chronic Care Management: CRR required this visit?  No   CCM required this visit?  No      Plan:     I have personally reviewed and noted the  following in the patient's chart:   . Medical and social history . Use of alcohol, tobacco or illicit drugs  . Current medications and supplements . Functional ability and status . Nutritional status . Physical activity . Advanced directives . List of other physicians . Hospitalizations, surgeries, and ER visits in previous 12 months . Vitals . Screenings to include cognitive, depression, and falls . Referrals and appointments  In addition, I have reviewed and discussed with patient certain preventive protocols, quality metrics, and best practice recommendations. A written personalized care plan for preventive services as well as general preventive health recommendations were provided to patient.     Clemetine Marker, LPN   05/09/1606   Nurse Notes: pt states she has been feeling anxious lately with no specific reason as to why. She was seen at urgent care on 12/30/19 and prescribed hydroxyzine that she is now out of. Patient requests refill to help manage anxiety. Pt advised will send to Dr. Army Melia.

## 2020-07-12 NOTE — Patient Instructions (Signed)
Cathy Murphy , Thank you for taking time to come for your Medicare Wellness Visit. I appreciate your ongoing commitment to your health goals. Please review the following plan we discussed and let me know if I can assist you in the future.   Screening recommendations/referrals: Colonoscopy: no longer required Mammogram: done 08/04/19. Please call [phone] to schedule your mammogram.  Bone Density: done 08/04/19 Recommended yearly ophthalmology/optometry visit for glaucoma screening and checkup Recommended yearly dental visit for hygiene and checkup  Vaccinations: Influenza vaccine: declined Pneumococcal vaccine: declined Tdap vaccine: done 03/05/11 Shingles vaccine: Shingrix discussed. Please contact your pharmacy for coverage information.  Covid-19: done 01/01/20 & 01/29/20  Advanced directives: Please bring a copy of your health care power of attorney and living will to the office at your convenience once you have completed those documents.   Conditions/risks identified: Recommend increasing physical activity   Next appointment: Follow up in one year for your annual wellness visit    Preventive Care 65 Years and Older, Female Preventive care refers to lifestyle choices and visits with your health care provider that can promote health and wellness. What does preventive care include?  A yearly physical exam. This is also called an annual well check.  Dental exams once or twice a year.  Routine eye exams. Ask your health care provider how often you should have your eyes checked.  Personal lifestyle choices, including:  Daily care of your teeth and gums.  Regular physical activity.  Eating a healthy diet.  Avoiding tobacco and drug use.  Limiting alcohol use.  Practicing safe sex.  Taking low-dose aspirin every day.  Taking vitamin and mineral supplements as recommended by your health care provider. What happens during an annual well check? The services and screenings done  by your health care provider during your annual well check will depend on your age, overall health, lifestyle risk factors, and family history of disease. Counseling  Your health care provider may ask you questions about your:  Alcohol use.  Tobacco use.  Drug use.  Emotional well-being.  Home and relationship well-being.  Sexual activity.  Eating habits.  History of falls.  Memory and ability to understand (cognition).  Work and work Statistician.  Reproductive health. Screening  You may have the following tests or measurements:  Height, weight, and BMI.  Blood pressure.  Lipid and cholesterol levels. These may be checked every 5 years, or more frequently if you are over 84 years old.  Skin check.  Lung cancer screening. You may have this screening every year starting at age 52 if you have a 30-pack-year history of smoking and currently smoke or have quit within the past 15 years.  Fecal occult blood test (FOBT) of the stool. You may have this test every year starting at age 63.  Flexible sigmoidoscopy or colonoscopy. You may have a sigmoidoscopy every 5 years or a colonoscopy every 10 years starting at age 25.  Hepatitis C blood test.  Hepatitis B blood test.  Sexually transmitted disease (STD) testing.  Diabetes screening. This is done by checking your blood sugar (glucose) after you have not eaten for a while (fasting). You may have this done every 1-3 years.  Bone density scan. This is done to screen for osteoporosis. You may have this done starting at age 62.  Mammogram. This may be done every 1-2 years. Talk to your health care provider about how often you should have regular mammograms. Talk with your health care provider about your test results,  treatment options, and if necessary, the need for more tests. Vaccines  Your health care provider may recommend certain vaccines, such as:  Influenza vaccine. This is recommended every year.  Tetanus,  diphtheria, and acellular pertussis (Tdap, Td) vaccine. You may need a Td booster every 10 years.  Zoster vaccine. You may need this after age 45.  Pneumococcal 13-valent conjugate (PCV13) vaccine. One dose is recommended after age 53.  Pneumococcal polysaccharide (PPSV23) vaccine. One dose is recommended after age 80. Talk to your health care provider about which screenings and vaccines you need and how often you need them. This information is not intended to replace advice given to you by your health care provider. Make sure you discuss any questions you have with your health care provider. Document Released: 11/17/2015 Document Revised: 07/10/2016 Document Reviewed: 08/22/2015 Elsevier Interactive Patient Education  2017 [employer] Prevention in the Home Falls can cause injuries. They can happen to people of all ages. There are many things you can do to make your home safe and to help prevent falls. What can I do on the outside of my home?  Regularly fix the edges of walkways and driveways and fix any cracks.  Remove anything that might make you trip as you walk through a door, such as a raised step or threshold.  Trim any bushes or trees on the path to your home.  Use bright outdoor lighting.  Clear any walking paths of anything that might make someone trip, such as rocks or tools.  Regularly check to see if handrails are loose or broken. Make sure that both sides of any steps have handrails.  Any raised decks and porches should have guardrails on the edges.  Have any leaves, snow, or ice cleared regularly.  Use sand or salt on walking paths during winter.  Clean up any spills in your garage right away. This includes oil or grease spills. What can I do in the bathroom?  Use night lights.  Install grab bars by the toilet and in the tub and shower. Do not use towel bars as grab bars.  Use non-skid mats or decals in the tub or shower.  If you need to sit down in  the shower, use a plastic, non-slip stool.  Keep the floor dry. Clean up any water that spills on the floor as soon as it happens.  Remove soap buildup in the tub or shower regularly.  Attach bath mats securely with double-sided non-slip rug tape.  Do not have throw rugs and other things on the floor that can make you trip. What can I do in the bedroom?  Use night lights.  Make sure that you have a light by your bed that is easy to reach.  Do not use any sheets or blankets that are too big for your bed. They should not hang down onto the floor.  Have a firm chair that has side arms. You can use this for support while you get dressed.  Do not have throw rugs and other things on the floor that can make you trip. What can I do in the kitchen?  Clean up any spills right away.  Avoid walking on wet floors.  Keep items that you use a lot in easy-to-reach places.  If you need to reach something above you, use a strong step stool that has a grab bar.  Keep electrical cords out of the way.  Do not use floor polish or wax that makes floors  slippery. If you must use wax, use non-skid floor wax.  Do not have throw rugs and other things on the floor that can make you trip. What can I do with my stairs?  Do not leave any items on the stairs.  Make sure that there are handrails on both sides of the stairs and use them. Fix handrails that are broken or loose. Make sure that handrails are as long as the stairways.  Check any carpeting to make sure that it is firmly attached to the stairs. Fix any carpet that is loose or worn.  Avoid having throw rugs at the top or bottom of the stairs. If you do have throw rugs, attach them to the floor with carpet tape.  Make sure that you have a light switch at the top of the stairs and the bottom of the stairs. If you do not have them, ask someone to add them for you. What else can I do to help prevent falls?  Wear shoes that:  Do not have high  heels.  Have rubber bottoms.  Are comfortable and fit you well.  Are closed at the toe. Do not wear sandals.  If you use a stepladder:  Make sure that it is fully opened. Do not climb a closed stepladder.  Make sure that both sides of the stepladder are locked into place.  Ask someone to hold it for you, if possible.  Clearly mark and make sure that you can see:  Any grab bars or handrails.  First and last steps.  Where the edge of each step is.  Use tools that help you move around (mobility aids) if they are needed. These include:  Canes.  Walkers.  Scooters.  Crutches.  Turn on the lights when you go into a dark area. Replace any light bulbs as soon as they burn out.  Set up your furniture so you have a clear path. Avoid moving your furniture around.  If any of your floors are uneven, fix them.  If there are any pets around you, be aware of where they are.  Review your medicines with your doctor. Some medicines can make you feel dizzy. This can increase your chance of falling. Ask your doctor what other things that you can do to help prevent falls. This information is not intended to replace advice given to you by your health care provider. Make sure you discuss any questions you have with your health care provider. Document Released: 08/17/2009 Document Revised: 03/28/2016 Document Reviewed: 11/25/2014 Elsevier Interactive Patient Education  2017 [employer].

## 2020-07-13 ENCOUNTER — Telehealth: Payer: Self-pay

## 2020-07-13 NOTE — Chronic Care Management (AMB) (Signed)
  Chronic Care Management   Note  07/13/2020 Name: [name] MRN: [id number] DOB: 09/22/1941  Cathy Murphy is a 79 y.o. year old female who is a primary care patient of Glean Hess, MD. I reached out to [name] by phone today in response to a referral sent by Ms. Kristen [employer]nett's health plan.     Ms. Lapenna was given information about Chronic Care Management services today including:  1. CCM service includes personalized support from designated clinical staff supervised by her physician, including individualized plan of care and coordination with other care providers 2. 24/7 contact phone numbers for assistance for urgent and routine care needs. 3. Service will only be billed when office clinical staff spend 20 minutes or more in a month to coordinate care. 4. Only one practitioner may furnish and bill the service in a calendar month. 5. The patient may stop CCM services at any time (effective at the end of the month) by phone call to the office staff. 6. The patient will be responsible for cost sharing (co-pay) of up to 20% of the service fee (after annual deductible is met).  Patient did not agree to enrollment in care management services and does not wish to consider at this time.  Follow up plan: The patient has been provided with contact information for the care management team and has been advised to call with any health related questions or concerns.   Noreene Larsson, [address], [place], [place] [address] Direct Dial: [phone] [email or web address]

## 2020-07-23 ENCOUNTER — Other Ambulatory Visit: Payer: Self-pay | Admitting: Internal Medicine

## 2020-07-23 DIAGNOSIS — I10 Essential (primary) hypertension: Secondary | ICD-10-CM

## 2020-07-23 NOTE — Telephone Encounter (Signed)
Requested Prescriptions  Pending Prescriptions Disp Refills   traZODone (DESYREL) 50 MG tablet [Pharmacy Med Name: TRAZODONE 50 MG TABLET] 90 tablet 1    Sig: TAKE 1 TABLET BY MOUTH EVERYDAY AT BEDTIME     Psychiatry: Antidepressants - Serotonin Modulator Passed - 07/23/2020 12:51 AM      Passed - Valid encounter within last 6 months    Recent Outpatient Visits          2 months ago Annual physical exam   [facility] Glean Hess, MD   10 months ago Essential (primary) hypertension   [facility] Glean Hess, MD   1 year ago Annual physical exam   [facility] Glean Hess, MD   1 year ago Skin lesions   [facility] Glean Hess, MD   1 year ago Mild intermittent reactive airway disease with wheezing without complication   [facility] Glean Hess, MD              lisinopril (ZESTRIL) 40 MG tablet [Pharmacy Med Name: LISINOPRIL 40 MG TABLET] 90 tablet 3    Sig: TAKE 1 TABLET BY MOUTH EVERY DAY     Cardiovascular:  ACE Inhibitors Failed - 07/23/2020 12:51 AM      Failed - Cr in normal range and within 180 days    Creatinine  Date Value Ref Range Status  07/22/2012 0.96 0.60 - 1.30 mg/dL Final   Creatinine, Ser  Date Value Ref Range Status  05/16/2020 1.06 (H) 0.57 - 1.00 mg/dL Final         Passed - K in normal range and within 180 days    Potassium  Date Value Ref Range Status  05/16/2020 4.2 3.5 - 5.2 mmol/L Final  07/22/2012 3.6 3.5 - 5.1 mmol/L Final         Passed - Patient is not pregnant      Passed - Last BP in normal range    BP Readings from Last 1 Encounters:  05/16/20 132/78         Passed - Valid encounter within last 6 months    Recent Outpatient Visits          2 months ago Annual physical exam   [facility] Glean Hess, MD   10 months ago Essential (primary) hypertension   [facility] Glean Hess, MD   1 year ago Annual physical exam    [facility] Glean Hess, MD   1 year ago Skin lesions   [facility] Glean Hess, MD   1 year ago Mild intermittent reactive airway disease with wheezing without complication   [facility] Glean Hess, MD

## 2020-08-07 ENCOUNTER — Other Ambulatory Visit: Payer: Self-pay | Admitting: Internal Medicine

## 2020-08-14 ENCOUNTER — Other Ambulatory Visit: Payer: Self-pay | Admitting: Internal Medicine

## 2020-08-14 DIAGNOSIS — Z1231 Encounter for screening mammogram for malignant neoplasm of breast: Secondary | ICD-10-CM

## 2020-08-16 ENCOUNTER — Encounter: Payer: Self-pay | Admitting: Internal Medicine

## 2020-08-16 ENCOUNTER — Telehealth: Payer: Self-pay

## 2020-08-16 ENCOUNTER — Ambulatory Visit (INDEPENDENT_AMBULATORY_CARE_PROVIDER_SITE_OTHER): Payer: Medicare Other | Admitting: Internal Medicine

## 2020-08-16 ENCOUNTER — Ambulatory Visit: Payer: Self-pay

## 2020-08-16 ENCOUNTER — Other Ambulatory Visit: Payer: Self-pay

## 2020-08-16 VITALS — BP 122/78 | HR 78 | Ht 69.0 in | Wt 181.0 lb

## 2020-08-16 DIAGNOSIS — E118 Type 2 diabetes mellitus with unspecified complications: Secondary | ICD-10-CM

## 2020-08-16 DIAGNOSIS — N1831 Chronic kidney disease, stage 3a: Secondary | ICD-10-CM | POA: Diagnosis not present

## 2020-08-16 DIAGNOSIS — I10 Essential (primary) hypertension: Secondary | ICD-10-CM

## 2020-08-16 DIAGNOSIS — E785 Hyperlipidemia, unspecified: Secondary | ICD-10-CM

## 2020-08-16 DIAGNOSIS — E1169 Type 2 diabetes mellitus with other specified complication: Secondary | ICD-10-CM | POA: Diagnosis not present

## 2020-08-16 LAB — POCT URINALYSIS DIPSTICK
Bilirubin, UA: NEGATIVE
Blood, UA: NEGATIVE
Glucose, UA: NEGATIVE
Ketones, UA: NEGATIVE
Leukocytes, UA: NEGATIVE
Nitrite, UA: NEGATIVE
Protein, UA: POSITIVE — AB
Spec Grav, UA: >=1.03 — AB (ref 1.010–1.025)
Urobilinogen, UA: 0.2 E.U./dL
pH, UA: 6 (ref 5.0–8.0)

## 2020-08-16 MED ORDER — ROSUVASTATIN CALCIUM 40 MG PO TABS: 40.0000 mg | ORAL_TABLET | Freq: Every day | ORAL | 1 refills | Status: DC

## 2020-08-16 NOTE — Telephone Encounter (Signed)
Copied from [place] [id number]. Topic: General - Call Back - No Documentation >> Aug 16, 2020 10:46 AM Gillis Ends D wrote: Reason for CRM: Patient called and asked for the nurse to return her call she has been experiencing some high blood pressures and she states it goes up and down. Her readings have been 154/88 and 152/81. Reached out to the triage team but they were unavailable, did sent a clinical call for them to return the call as well. She can be reached at [phone]. Please advise

## 2020-08-16 NOTE — Telephone Encounter (Signed)
Called and spoke with patient she states that her BP has been high and she has a headache.  She states that her cardiologist just stopped her amlodipine because she had swelling in her legs  She take spirolactone and Lisinopril.  She has not missed any doses.  BP today 154/88  And on 08/09/20  152/81. She does not check her BP everyday.  She states that she is concerned because she is having headache. Her cardiology appointment 06/27/20 140/74. This was before the medication change. She denies weakness, blurred vision, chest pain. She states she has no swelling. Per protocol patient was asked to go to ER for evaluation of increasing BP with headache. Patient interrupted and states that Dr office is calling her. We ended our conversation so that she could consult with office.  Reason for Disposition . [2] Systolic BP  >= 633 OR Diastolic >= 354 AND [5] cardiac or neurologic symptoms (e.g., chest pain, difficulty breathing, unsteady gait, blurred vision)  Answer Assessment - Initial Assessment Questions 1. BLOOD PRESSURE: "What is the blood pressure?" "Did you take at least two measurements 5 minutes apart?"     154/88, on 10/6 152/81 2. ONSET: "When did you take your blood pressure?"     Today 154/88 3. HOW: "How did you obtain the blood pressure?" (e.g., visiting nurse, automatic home BP monitor)    Home automatic wrist 4. HISTORY: "Do you have a history of high blood pressure?"     yes 5. MEDICATIONS: "Are you taking any medications for blood pressure?" "Have you missed any doses recently?"     Not missed but taken off amlodipine by Cardiology 6. OTHER SYMPTOMS: "Do you have any symptoms?" (e.g., headache, chest pain, blurred vision, difficulty breathing, weakness)    headache 7. PREGNANCY: "Is there any chance you are pregnant?" "When was your last menstrual period?"    N/A  Protocols used: BLOOD PRESSURE - HIGH-A-AH

## 2020-08-16 NOTE — Progress Notes (Signed)
Date:  08/16/2020   Name:  Cathy Murphy   DOB:  Sep 01, 1941   MRN:  [id number]   Chief Complaint: Diabetes (4 month follow up) and Hypertension (Complaining of high BP at home. Brought cuff today to compare. Uses a wrist cuff at home. Always checks her Left wrist- today with her wrist cuff in office we got 143/73)  Diabetes She presents for her follow-up diabetic visit. She has type 2 diabetes mellitus. Her disease course has been stable. Pertinent negatives for hypoglycemia include no headaches or tremors. Pertinent negatives for diabetes include no chest pain, no fatigue, no polydipsia and no polyuria. Current diabetic treatment includes oral agent (triple therapy) and insulin injections (levemir, Actos, januvia, glipizide). She is compliant with treatment all of the time. Her weight is stable. She monitors blood glucose at home 3-4 x per week. Her breakfast blood glucose is taken between 7-8 am. Her breakfast blood glucose range is generally 110-130 mg/dl. An ACE inhibitor/angiotensin II receptor blocker is being taken.  Hypertension This is a chronic problem. The problem is uncontrolled (high readings at home with wrist cuff). Pertinent negatives include no chest pain, headaches, palpitations or shortness of breath. Past treatments include ACE inhibitors and diuretics. The current treatment provides significant improvement.  Hyperlipidemia This is a chronic problem. The problem is resistant. Pertinent negatives include no chest pain or shortness of breath. Current antihyperlipidemic treatment includes statins and ezetimibe (lipitor 80 and zetia 10 mg with LDL > 100). There are no compliance problems.  Risk factors for coronary artery disease include diabetes mellitus.    Lab Results  Component Value Date   CREATININE 1.06 (H) 05/16/2020   BUN 15 05/16/2020   NA 140 05/16/2020   K 4.2 05/16/2020   CL 100 05/16/2020   CO2 27 05/16/2020   Lab Results  Component Value Date   CHOL 175  05/16/2020   HDL 55 05/16/2020   LDLCALC 103 (H) 05/16/2020   TRIG 92 05/16/2020   CHOLHDL 3.2 05/16/2020   Lab Results  Component Value Date   TSH 2.870 05/16/2020   Lab Results  Component Value Date   HGBA1C 8.0 (H) 05/16/2020   Lab Results  Component Value Date   WBC 5.3 05/16/2020   HGB 14.7 05/16/2020   HCT 46.0 05/16/2020   MCV 91 05/16/2020   PLT 211 05/16/2020   Lab Results  Component Value Date   ALT 12 05/16/2020   AST 13 05/16/2020   ALKPHOS 88 05/16/2020   BILITOT 1.3 (H) 05/16/2020     Review of Systems  Constitutional: Negative for appetite change, fatigue, fever and unexpected weight change.  HENT: Negative for tinnitus and trouble swallowing.   Eyes: Negative for visual disturbance.  Respiratory: Negative for cough, chest tightness and shortness of breath.   Cardiovascular: Negative for chest pain, palpitations and leg swelling.  Gastrointestinal: Negative for abdominal pain.  Endocrine: Negative for polydipsia and polyuria.  Genitourinary: Negative for dysuria and hematuria.  Musculoskeletal: Negative for arthralgias.  Neurological: Negative for tremors, numbness and headaches.  Psychiatric/Behavioral: Negative for dysphoric mood.    Patient Active Problem List   Diagnosis Date Noted  . Stage 3a chronic kidney disease ([place]) 08/16/2020  . Osteopenia determined by x-ray 08/05/2019  . Atherosclerosis of abdominal aorta ([place]) 06/09/2019  . Coronary artery disease involving native coronary artery of native heart 06/09/2019  . BMI 24.0-24.9, adult 11/06/2018  . Bilateral neck pain 10/08/2018  . Aortic calcification ([place]) 03/05/2018  . Arthritis  of shoulder 03/13/2017  . Ventral hernia without obstruction or gangrene 03/06/2016  . Type II diabetes mellitus with complication ([place]) 81/85/6314  . Plantar fasciitis, right 06/16/2015  . Knee torn cartilage 04/26/2015  . Allergic rhinitis, seasonal 04/26/2015  . Essential (primary) hypertension  04/26/2015  . Glaucoma 04/26/2015  . Hyperlipidemia associated with type 2 diabetes mellitus ([place]) 04/26/2015  . Insomnia 04/26/2015    Allergies  Allergen Reactions  . Amoxicillin Diarrhea  . Aspirin Nausea And Vomiting  . Metformin And Related Diarrhea  . Oxycodone Other (See Comments)    CNS side effects/agitation  . Sulfa Antibiotics     Past Surgical History:  Procedure Laterality Date  . ABDOMINAL HYSTERECTOMY    . COLONOSCOPY WITH PROPOFOL N/A 11/25/2017   Procedure: COLONOSCOPY WITH PROPOFOL;  Surgeon: [name], MD;  Location: [facility] ENDOSCOPY;  Service: Endoscopy;  Laterality: N/A;  . LIPOMA RESECTION     right posterior neck    Social History   Tobacco Use  . Smoking status: Former Smoker    Packs/day: 1.00    Years: 7.00    Pack years: 7.00    Types: Cigarettes    Quit date: 1965    Years since quitting: 56.8  . Smokeless tobacco: Never Used  . Tobacco comment: smoking cessation materials not required  Vaping Use  . Vaping Use: Never used  Substance Use Topics  . Alcohol use: No  . Drug use: No     Medication list has been reviewed and updated.  Current Meds  Medication Sig  . B-D UF III MINI PEN NEEDLES 31G X 5 MM MISC USE AS DIRECTED WITH LEVEMIR  . cholecalciferol (VITAMIN D) 1000 units tablet Take 2,000 Units by mouth daily.  . clindamycin (CLEOCIN) 150 MG capsule Take 150 mg by mouth every 8 (eight) hours.  Marland Kitchen ezetimibe (ZETIA) 10 MG tablet Take 10 mg by mouth daily.  Marland Kitchen glipiZIDE (GLUCOTROL) 5 MG tablet TAKE 1 TABLET BY MOUTH TWICE A DAY  . glucose blood test strip Use to test blood sugar up to twice daily.  . hydrOXYzine (ATARAX/VISTARIL) 25 MG tablet Take 0.5-1 tablets (12.5-25 mg total) by mouth every 8 (eight) hours as needed for anxiety.  Marland Kitchen JANUVIA 100 MG tablet TAKE 1 TABLET BY MOUTH EVERY DAY  . LEVEMIR FLEXTOUCH 100 UNIT/ML FlexPen INJECT 30 UNITS INTO THE SKIN DAILY.  Marland Kitchen lisinopril (ZESTRIL) 40 MG tablet TAKE 1 TABLET BY MOUTH  EVERY DAY  . LUMIGAN 0.01 % SOLN TAKE 1 DROP(S) IN BOTH EYES ONCE IN THE EVENING  . pioglitazone (ACTOS) 15 MG tablet TAKE 1 TABLET BY MOUTH EVERY DAY  . spironolactone (ALDACTONE) 25 MG tablet Take 25 mg by mouth daily.  . timolol (TIMOPTIC) 0.5 % ophthalmic solution Place 1 drop into both eyes.  . traZODone (DESYREL) 50 MG tablet TAKE 1 TABLET BY MOUTH EVERYDAY AT BEDTIME  . [DISCONTINUED] atorvastatin (LIPITOR) 80 MG tablet TAKE 1 TABLET BY MOUTH EVERY DAY    PHQ 2/9 Scores 08/16/2020 07/12/2020 05/16/2020 09/14/2019  PHQ - 2 Score 4 0 0 0  PHQ- 9 Score 8 - 0 -    GAD 7 : Generalized Anxiety Score 08/16/2020 05/16/2020 02/18/2019  Nervous, Anxious, on Edge 0 1 0  Control/stop worrying 0 1 3  Worry too much - different things 0 1 3  Trouble relaxing 0 1 2  Restless 0 0 1  Easily annoyed or irritable 0 1 0  Afraid - awful might happen 0 0 2  Total GAD 7 Score 0 5 11  Anxiety Difficulty Not difficult at all Not difficult at all Somewhat difficult    BP Readings from Last 3 Encounters:  08/16/20 122/78  05/16/20 132/78  12/30/19 (!) 141/70    Physical Exam Vitals and nursing note reviewed.  Constitutional:      General: She is not in acute distress.    Appearance: Normal appearance. She is well-developed.  HENT:     Head: Normocephalic and atraumatic.  Cardiovascular:     Rate and Rhythm: Normal rate and regular rhythm.     Pulses: Normal pulses.  Pulmonary:     Effort: Pulmonary effort is normal. No respiratory distress.     Breath sounds: No wheezing or rhonchi.  Musculoskeletal:        General: Normal range of motion.     Right lower leg: No edema.     Left lower leg: No edema.  Lymphadenopathy:     Cervical: No cervical adenopathy.  Skin:    General: Skin is warm and dry.     Findings: No rash.  Neurological:     Mental Status: She is alert and oriented to person, place, and time.  Psychiatric:        Behavior: Behavior normal.        Thought Content: Thought  content normal.     Wt Readings from Last 3 Encounters:  08/16/20 181 lb (82.1 kg)  05/16/20 180 lb (81.6 kg)  12/30/19 174 lb (78.9 kg)    BP 122/78   Pulse 78   Ht 5\' 9"  (1.753 m)   Wt 181 lb (82.1 kg)   SpO2 98%   BMI 26.73 kg/m   Assessment and Plan: 1. Type II diabetes mellitus with complication (HCC) Clinically stable by exam and report without s/s of hypoglycemia. DM complicated by HTN and lipids. Tolerating medications well without side effects or other concerns. Some low reading in the middle of the night - move insulin dose to the morning - Hemoglobin A1c - POCT urinalysis dipstick - negative for blood  2. Essential (primary) hypertension Clinically stable exam with well controlled BP after the addition of spironolactone. Tolerating medications without side effects at this time. Pt to continue current regimen and low sodium diet; benefits of regular exercise as able discussed. Readings high due to wrist cuff - she can continue to use it but subtract 20 points  3. Hyperlipidemia associated with type 2 diabetes mellitus (HCC) LDL not a goal at 103. She did not have any benefit from adding zetia Will try change to Crestor 40 mg - rosuvastatin (CRESTOR) 40 MG tablet; Take 1 tablet (40 mg total) by mouth daily.  Dispense: 90 tablet; Refill: 1  4. Stage 3a chronic kidney disease ([place]) - Basic metabolic panel   Partially dictated using Editor, commissioning. Any errors are unintentional.  Halina Maidens, MD [facility]  08/16/2020

## 2020-08-16 NOTE — Telephone Encounter (Signed)
Pt has an appointment today 08/16/2020.  KP

## 2020-08-16 NOTE — Patient Instructions (Signed)
Change insulin dosing to the morning.  When you run out of atorvastatin 80 mg, fill the rosuvastatin 40 mg instead

## 2020-08-17 LAB — BASIC METABOLIC PANEL
BUN/Creatinine Ratio: 15 (ref 12–28)
BUN: 20 mg/dL (ref 8–27)
CO2: 27 mmol/L (ref 20–29)
Calcium: 9.5 mg/dL (ref 8.7–10.3)
Chloride: 103 mmol/L (ref 96–106)
Creatinine, Ser: 1.35 mg/dL — ABNORMAL HIGH (ref 0.57–1.00)
GFR calc Af Amer: 44 mL/min/{1.73_m2} — ABNORMAL LOW (ref >59)
GFR calc non Af Amer: 38 mL/min/{1.73_m2} — ABNORMAL LOW (ref >59)
Glucose: 184 mg/dL — ABNORMAL HIGH (ref 65–99)
Potassium: 4.6 mmol/L (ref 3.5–5.2)
Sodium: 143 mmol/L (ref 134–144)

## 2020-08-17 LAB — HEMOGLOBIN A1C
Est. average glucose Bld gHb Est-mCnc: 192 mg/dL
Hgb A1c MFr Bld: 8.3 % — ABNORMAL HIGH (ref 4.8–5.6)

## 2020-08-20 ENCOUNTER — Other Ambulatory Visit: Payer: Self-pay | Admitting: Internal Medicine

## 2020-08-20 NOTE — Telephone Encounter (Signed)
Requested Prescriptions  Pending Prescriptions Disp Refills  . JANUVIA 100 MG tablet [Pharmacy Med Name: JANUVIA 100 MG TABLET] 90 tablet 1    Sig: TAKE 1 TABLET BY [place] DAY     Endocrinology:  Diabetes - DPP-4 Inhibitors Failed - 08/20/2020 12:42 AM      Failed - HBA1C is between 0 and 7.9 and within 180 days    Hgb A1c MFr Bld  Date Value Ref Range Status  08/16/2020 8.3 (H) 4.8 - 5.6 % Final    Comment:             Prediabetes: 5.7 - 6.4          Diabetes: >6.4          Glycemic control for adults with diabetes: <7.0          Failed - Cr in normal range and within 360 days    Creatinine  Date Value Ref Range Status  07/22/2012 0.96 0.60 - 1.30 mg/dL Final   Creatinine, Ser  Date Value Ref Range Status  08/16/2020 1.35 (H) 0.57 - 1.00 mg/dL Final         Passed - Valid encounter within last 6 months    Recent Outpatient Visits          4 days ago Type II diabetes mellitus with complication [facility])   [facility] Glean Hess, MD   3 months ago Annual physical exam   [facility] Glean Hess, MD   11 months ago Essential (primary) hypertension   [facility] Glean Hess, MD   1 year ago Annual physical exam   [facility] Glean Hess, MD   1 year ago Skin lesions   [facility] Glean Hess, MD      Future Appointments            In 4 months [employer] Melia Jesse Sans, MD [facility]

## 2020-08-22 ENCOUNTER — Ambulatory Visit
Admission: RE | Admit: 2020-08-22 | Discharge: 2020-08-22 | Disposition: A | Discharge status: Home / Self Care | Payer: Medicare Other | Source: Ambulatory Visit | Attending: Internal Medicine | Admitting: Internal Medicine

## 2020-08-22 ENCOUNTER — Other Ambulatory Visit: Payer: Self-pay

## 2020-08-22 DIAGNOSIS — Z1231 Encounter for screening mammogram for malignant neoplasm of breast: Secondary | ICD-10-CM | POA: Insufficient documentation

## 2020-09-22 ENCOUNTER — Other Ambulatory Visit: Payer: Self-pay | Admitting: Internal Medicine

## 2020-09-25 ENCOUNTER — Other Ambulatory Visit: Payer: Self-pay | Admitting: Internal Medicine

## 2020-11-04 DIAGNOSIS — Z9841 Cataract extraction status, right eye: Secondary | ICD-10-CM

## 2020-11-04 HISTORY — DX: Cataract extraction status, right eye: Z98.41

## 2020-11-07 ENCOUNTER — Other Ambulatory Visit: Payer: Self-pay | Admitting: Internal Medicine

## 2020-11-13 ENCOUNTER — Other Ambulatory Visit: Payer: Self-pay | Admitting: Internal Medicine

## 2020-11-13 DIAGNOSIS — E119 Type 2 diabetes mellitus without complications: Secondary | ICD-10-CM

## 2020-12-11 ENCOUNTER — Ambulatory Visit (INDEPENDENT_AMBULATORY_CARE_PROVIDER_SITE_OTHER): Payer: Medicare Other | Admitting: Internal Medicine

## 2020-12-11 ENCOUNTER — Encounter: Payer: Self-pay | Admitting: Internal Medicine

## 2020-12-11 ENCOUNTER — Other Ambulatory Visit: Payer: Self-pay

## 2020-12-11 VITALS — BP 128/64 | HR 72 | Temp 98.1°F | Ht 69.0 in | Wt 183.0 lb

## 2020-12-11 DIAGNOSIS — F419 Anxiety disorder, unspecified: Secondary | ICD-10-CM | POA: Diagnosis not present

## 2020-12-11 DIAGNOSIS — E118 Type 2 diabetes mellitus with unspecified complications: Secondary | ICD-10-CM

## 2020-12-11 DIAGNOSIS — R419 Unspecified symptoms and signs involving cognitive functions and awareness: Secondary | ICD-10-CM | POA: Diagnosis not present

## 2020-12-11 DIAGNOSIS — I700 Atherosclerosis of aorta: Secondary | ICD-10-CM | POA: Diagnosis not present

## 2020-12-11 LAB — POCT GLYCOSYLATED HEMOGLOBIN (HGB A1C)
HbA1c POC (<> result, manual entry): 0 % (ref 4.0–5.6)
HbA1c, POC (controlled diabetic range): 0 % (ref 0.0–7.0)
HbA1c, POC (prediabetic range): 0 % — AB (ref 5.7–6.4)
Hemoglobin A1C: 7.5 % — AB (ref 4.0–5.6)

## 2020-12-11 MED ORDER — HYDROXYZINE HCL 25 MG PO TABS: 12.5000 mg | ORAL_TABLET | Freq: Three times a day (TID) | ORAL | 0 refills | Status: DC | PRN

## 2020-12-11 NOTE — Progress Notes (Signed)
Date:  12/11/2020   Name:  Cathy Murphy   DOB:  1941-01-05   MRN:  [id number]   Chief Complaint: Memory Loss (Driving and getting lost for several months. Happened this morning when she was going home. She lost her way going home. )  Diabetes She presents for her follow-up diabetic visit. She has type 2 diabetes mellitus. Hypoglycemia symptoms include headaches and nervousness/anxiousness. Pertinent negatives for hypoglycemia include no dizziness. Pertinent negatives for diabetes include no chest pain and no fatigue. Current diabetic treatment includes insulin pump (actos, januvia, glipizide).   Memory concerns - she has stopped driving at night and gets lost frequently.  Today she lost her way to jury duty, stopped and was eventually able to drive home.  It has happened one other time but today was the worst.   She denies any other memory issues.  She lives alone and lays her medications out.  She has not missed any bill payments, she has not had trouble shopping or preparing meals.  Today she did not eat breakfast but took her oral medication before leaving for jury duty.  They dismissed her and she became disoriented on the way home.  She felt diaphoretic and nauseated.  She did find her way home, ate something then took a nap.  She asked her cousin to drive her here today but she knew the way.   Lab Results  Component Value Date   CREATININE 1.35 (H) 08/16/2020   BUN 20 08/16/2020   NA 143 08/16/2020   K 4.6 08/16/2020   CL 103 08/16/2020   CO2 27 08/16/2020   Lab Results  Component Value Date   CHOL 175 05/16/2020   HDL 55 05/16/2020   LDLCALC 103 (H) 05/16/2020   TRIG 92 05/16/2020   CHOLHDL 3.2 05/16/2020   Lab Results  Component Value Date   TSH 2.870 05/16/2020   Lab Results  Component Value Date   HGBA1C 7.5 (A) 12/11/2020   HGBA1C 0 12/11/2020   HGBA1C 0 (A) 12/11/2020   HGBA1C 0.0 12/11/2020   Lab Results  Component Value Date   WBC 5.3 05/16/2020   HGB  14.7 05/16/2020   HCT 46.0 05/16/2020   MCV 91 05/16/2020   PLT 211 05/16/2020   Lab Results  Component Value Date   ALT 12 05/16/2020   AST 13 05/16/2020   ALKPHOS 88 05/16/2020   BILITOT 1.3 (H) 05/16/2020     Review of Systems  Constitutional: Negative for chills, fatigue, fever and unexpected weight change.  HENT: Negative for trouble swallowing.   Respiratory: Negative for cough, chest tightness, shortness of breath and wheezing.   Cardiovascular: Negative for chest pain, palpitations and leg swelling.  Musculoskeletal: Negative for arthralgias.  Neurological: Positive for headaches. Negative for dizziness.  Psychiatric/Behavioral: Negative for dysphoric mood and sleep disturbance. The patient is nervous/anxious.     Patient Active Problem List   Diagnosis Date Noted  . Stage 3a chronic kidney disease ([place]) 08/16/2020  . Osteopenia determined by x-ray 08/05/2019  . Atherosclerosis of abdominal aorta ([place]) 06/09/2019  . Coronary artery disease involving native coronary artery of native heart 06/09/2019  . BMI 24.0-24.9, adult 11/06/2018  . Bilateral neck pain 10/08/2018  . Aortic calcification ([place]) 03/05/2018  . 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 Ventral hernia without obstruction or gangrene 03/06/2016  . Type II diabetes mellitus with complication ([place]) 57/32/2025  . Plantar fasciitis, right 06/16/2015  . Knee torn cartilage 04/26/2015  . Allergic  rhinitis, seasonal 04/26/2015  . Essential (primary) hypertension 04/26/2015  . Glaucoma 04/26/2015  . Hyperlipidemia associated with type 2 diabetes mellitus ([place]) 04/26/2015  . Insomnia 04/26/2015    Allergies  Allergen Reactions  . Amoxicillin Diarrhea  . Aspirin Nausea And Vomiting  . Metformin And Related Diarrhea  . Oxycodone Other (See Comments)    CNS side effects/agitation  . Sulfa Antibiotics     Past Surgical History:  Procedure Laterality Date  . ABDOMINAL HYSTERECTOMY    . COLONOSCOPY WITH  PROPOFOL N/A 11/25/2017   Procedure: COLONOSCOPY WITH PROPOFOL;  Surgeon: [name], MD;  Location: [facility] ENDOSCOPY;  Service: Endoscopy;  Laterality: N/A;  . LIPOMA RESECTION     right posterior neck    Social History   Tobacco Use  . Smoking status: Former Smoker    Packs/day: 1.00    Years: 7.00    Pack years: 7.00    Types: Cigarettes    Quit date: 1965    Years since quitting: 57.1  . Smokeless tobacco: Never Used  . Tobacco comment: smoking cessation materials not required  Vaping Use  . Vaping Use: Never used  Substance Use Topics  . Alcohol use: No  . Drug use: No     Medication list has been reviewed and updated.  Current Meds  Medication Sig  . B-D UF III MINI PEN NEEDLES 31G X 5 MM MISC USE AS DIRECTED WITH LEVEMIR  . cholecalciferol (VITAMIN D) 1000 units tablet Take 2,000 Units by mouth daily.  Marland Kitchen ezetimibe (ZETIA) 10 MG tablet Take 10 mg by mouth daily.  Marland Kitchen glipiZIDE (GLUCOTROL) 5 MG tablet TAKE 1 TABLET BY MOUTH TWICE A DAY  . glucose blood test strip Use to test blood sugar up to twice daily.  Marland Kitchen JANUVIA 100 MG tablet TAKE 1 TABLET BY MOUTH EVERY DAY  . LEVEMIR FLEXTOUCH 100 UNIT/ML FlexPen INJECT 30 UNITS INTO THE SKIN DAILY.  Marland Kitchen lisinopril (ZESTRIL) 40 MG tablet TAKE 1 TABLET BY MOUTH EVERY DAY  . LUMIGAN 0.01 % SOLN TAKE 1 DROP(S) IN BOTH EYES ONCE IN THE EVENING  . pioglitazone (ACTOS) 15 MG tablet TAKE 1 TABLET BY MOUTH EVERY DAY  . rosuvastatin (CRESTOR) 40 MG tablet Take 1 tablet (40 mg total) by mouth daily.  Marland Kitchen spironolactone (ALDACTONE) 25 MG tablet Take 25 mg by mouth daily.  . timolol (TIMOPTIC) 0.5 % ophthalmic solution Place 1 drop into both eyes.  . traZODone (DESYREL) 50 MG tablet TAKE 1 TABLET BY MOUTH EVERYDAY AT BEDTIME  . [DISCONTINUED] clindamycin (CLEOCIN) 150 MG capsule Take 150 mg by mouth every 8 (eight) hours.  . [DISCONTINUED] hydrOXYzine (ATARAX/VISTARIL) 25 MG tablet TAKE 0.5-1 TABLETS (12.5-25 MG TOTAL) BY MOUTH EVERY 8  (EIGHT) HOURS AS NEEDED FOR ANXIETY.    PHQ 2/9 Scores 12/11/2020 08/16/2020 07/12/2020 05/16/2020  PHQ - 2 Score 0 4 0 0  PHQ- 9 Score 9 8 - 0    GAD 7 : Generalized Anxiety Score 12/11/2020 08/16/2020 05/16/2020 02/18/2019  Nervous, Anxious, on Edge 1 0 1 0  Control/stop worrying 0 0 1 3  Worry too much - different things 0 0 1 3  Trouble relaxing 3 0 1 2  Restless 0 0 0 1  Easily annoyed or irritable 0 0 1 0  Afraid - awful might happen 0 0 0 2  Total GAD 7 Score 4 0 5 11  Anxiety Difficulty Not difficult at all Not difficult at all Not difficult at all Somewhat difficult  6CIT Screen 12/11/2020 07/12/2020 07/07/2019 04/15/2018 04/07/2017  What Year? 0 points 0 points 0 points 0 points 0 points  What month? 0 points 0 points 0 points 0 points 0 points  What time? 0 points 0 points 0 points 0 points 0 points  Count back from 20 4 points 0 points 0 points 2 points 0 points  Months in reverse 2 points 0 points 0 points 2 points 0 points  Repeat phrase 4 points 0 points 0 points 2 points 0 points  Total Score 10 0 0 6 0      BP Readings from Last 3 Encounters:  12/11/20 128/64  08/16/20 122/78  05/16/20 132/78    Physical Exam Constitutional:      Appearance: Normal appearance.  HENT:     Head: Normocephalic.     Comments: Nontender, no TA tenderness Eyes:     Pupils: Pupils are equal, round, and reactive to light.  Cardiovascular:     Rate and Rhythm: Normal rate and regular rhythm.     Pulses: Normal pulses.     Heart sounds: No murmur heard.   Pulmonary:     Effort: Pulmonary effort is normal.     Breath sounds: Normal breath sounds.  Musculoskeletal:     Cervical back: Normal range of motion.     Right lower leg: No edema.     Left lower leg: No edema.  Lymphadenopathy:     Cervical: No cervical adenopathy.  Skin:    General: Skin is warm.  Neurological:     General: No focal deficit present.     Mental Status: She is alert and oriented to person, place, and time.      Wt Readings from Last 3 Encounters:  12/11/20 183 lb (83 kg)  08/16/20 181 lb (82.1 kg)  05/16/20 180 lb (81.6 kg)    BP 128/64   Pulse 72   Temp 98.1 F (36.7 C) (Oral)   Ht 5\' 9"  (1.753 m)   Wt 183 lb (83 kg)   SpO2 100%   BMI 27.02 kg/m   Assessment and Plan: 1. Cognitive complaints Always while driving - pt denies any other issues.  Today may have been low BS since she had not eaten but took oral meds and she had some diaphoresis and nausea. May need to reduce DM meds if persistent If labs are normal, pt requests referral to Neurology which is appropriate - Vitamin B12 - Sedimentation rate - TSH + free T4 - RPR  2. Type II diabetes mellitus with complication (HCC) Controlled on current medications - A1C 7.5 today May consider reducing medication if she has recurrent sx - POCT glycosylated hemoglobin (Hb A1C)  3. Aortic calcification (HCC) On statin therapy  4. Anxiety Continue trazodone at HS and vistaril as needed - hydrOXYzine (ATARAX/VISTARIL) 25 MG tablet; Take 0.5-1 tablets (12.5-25 mg total) by mouth every 8 (eight) hours as needed for anxiety.  Dispense: 30 tablet; Refill: 0   Partially dictated using Editor, commissioning. Any errors are unintentional.  Halina Maidens, MD [facility]  12/11/2020

## 2020-12-12 ENCOUNTER — Other Ambulatory Visit: Payer: Self-pay | Admitting: Internal Medicine

## 2020-12-12 DIAGNOSIS — R419 Unspecified symptoms and signs involving cognitive functions and awareness: Secondary | ICD-10-CM

## 2020-12-12 LAB — TSH+FREE T4
Free T4: 1.02 ng/dL (ref 0.82–1.77)
TSH: 1.52 u[IU]/mL (ref 0.450–4.500)

## 2020-12-12 LAB — VITAMIN B12: Vitamin B-12: 324 pg/mL (ref 232–1245)

## 2020-12-12 LAB — RPR: RPR Ser Ql: NONREACTIVE

## 2020-12-12 LAB — SEDIMENTATION RATE: Sed Rate: 29 mm/hr (ref 0–40)

## 2020-12-20 DIAGNOSIS — H401132 Primary open-angle glaucoma, bilateral, moderate stage: Secondary | ICD-10-CM | POA: Diagnosis not present

## 2020-12-22 DIAGNOSIS — H401132 Primary open-angle glaucoma, bilateral, moderate stage: Secondary | ICD-10-CM | POA: Diagnosis not present

## 2020-12-22 LAB — HM DIABETES EYE EXAM

## 2020-12-26 ENCOUNTER — Ambulatory Visit: Payer: Medicare Other | Admitting: Internal Medicine

## 2020-12-28 DIAGNOSIS — G479 Sleep disorder, unspecified: Secondary | ICD-10-CM | POA: Diagnosis not present

## 2020-12-28 DIAGNOSIS — R413 Other amnesia: Secondary | ICD-10-CM | POA: Diagnosis not present

## 2020-12-28 DIAGNOSIS — R519 Headache, unspecified: Secondary | ICD-10-CM | POA: Diagnosis not present

## 2020-12-29 DIAGNOSIS — R413 Other amnesia: Secondary | ICD-10-CM | POA: Insufficient documentation

## 2020-12-29 DIAGNOSIS — G479 Sleep disorder, unspecified: Secondary | ICD-10-CM | POA: Insufficient documentation

## 2020-12-29 DIAGNOSIS — R519 Headache, unspecified: Secondary | ICD-10-CM | POA: Insufficient documentation

## 2020-12-29 DIAGNOSIS — G3184 Mild cognitive impairment, so stated: Secondary | ICD-10-CM | POA: Insufficient documentation

## 2021-01-05 ENCOUNTER — Other Ambulatory Visit: Payer: Self-pay | Admitting: Internal Medicine

## 2021-01-16 ENCOUNTER — Encounter: Payer: Self-pay | Admitting: Internal Medicine

## 2021-01-18 ENCOUNTER — Other Ambulatory Visit: Payer: Self-pay | Admitting: Internal Medicine

## 2021-01-18 DIAGNOSIS — I10 Essential (primary) hypertension: Secondary | ICD-10-CM

## 2021-01-22 ENCOUNTER — Ambulatory Visit
Admission: EM | Admit: 2021-01-22 | Discharge: 2021-01-22 | Disposition: A | Discharge status: Home / Self Care | Payer: Medicare Other | Attending: Family Medicine | Admitting: Family Medicine

## 2021-01-22 ENCOUNTER — Other Ambulatory Visit: Payer: Self-pay

## 2021-01-22 DIAGNOSIS — G4762 Sleep related leg cramps: Secondary | ICD-10-CM | POA: Diagnosis not present

## 2021-01-22 DIAGNOSIS — T887XXA Unspecified adverse effect of drug or medicament, initial encounter: Secondary | ICD-10-CM | POA: Diagnosis not present

## 2021-01-22 NOTE — ED Triage Notes (Addendum)
Pt c/o bilateral calf pain/cramps that has been increasing over the past 2 weeks. Pt reports it intensified last night. Pt did recently start 2 new medications, donepezil and nortriptyline, she is concerned these may be contributing.

## 2021-01-22 NOTE — Discharge Instructions (Signed)
Stay hydrated.  Stretch before bed.  Stop the Donepezil & inform neurology.  Take care  Dr. Lacinda Axon

## 2021-01-22 NOTE — ED Provider Notes (Signed)
[facility]    CSN: [id number] Arrival date & time: 01/22/21  0901      History   Chief Complaint Chief Complaint  Patient presents with  . Leg Pain    Bilateral calves   HPI  80 year old female presents with the above complaint.  Patient reports a 3-week history of leg cramps.  Cramps occur in the calf.  Occur primarily at night.  She states that last night was particular bad.  Pain has been severe.  She states that last night she was unable to get them to resolve.  She tried several over-the-counter remedies without resolution.  Patient is concerned that this may be a medication side effect.  She has recently started 2 new medications, donepezil and nortriptyline.  She believes that 1 of these may be causing her symptoms as she has never had this before.  No other complaints or concerns at this time.  Past Medical History:  Diagnosis Date  . Allergy   . Anxiety   . Diabetes mellitus without complication ([place])   . Glaucoma   . Hyperlipidemia   . Hypertension     Patient Active Problem List   Diagnosis Date Noted  . Difficulty sleeping 12/29/2020  . Memory loss or impairment 12/29/2020  . Headache disorder 12/29/2020  . Stage 3a chronic kidney disease ([place]) 08/16/2020  . Osteopenia determined by x-ray 08/05/2019  . Atherosclerosis of abdominal aorta ([place]) 06/09/2019  . Coronary artery disease involving native coronary artery of native heart 06/09/2019  . BMI 24.0-24.9, adult 11/06/2018  . Bilateral neck pain 10/08/2018  . Aortic calcification ([place]) 03/05/2018  . 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 Ventral hernia without obstruction or gangrene 03/06/2016  . Type II diabetes mellitus with complication ([place]) 12/87/8676  . Plantar fasciitis, right 06/16/2015  . Knee torn cartilage 04/26/2015  . Allergic rhinitis, seasonal 04/26/2015  . Essential (primary) hypertension 04/26/2015  . Glaucoma 04/26/2015  . Hyperlipidemia associated with type 2 diabetes  mellitus ([place]) 04/26/2015  . Insomnia 04/26/2015    Past Surgical History:  Procedure Laterality Date  . ABDOMINAL HYSTERECTOMY    . COLONOSCOPY WITH PROPOFOL N/A 11/25/2017   Procedure: COLONOSCOPY WITH PROPOFOL;  Surgeon: [name], MD;  Location: [facility] ENDOSCOPY;  Service: Endoscopy;  Laterality: N/A;  . LIPOMA RESECTION     right posterior neck    OB History   No obstetric history on file.      Home Medications    Prior to Admission medications   Medication Sig Start Date End Date Taking? Authorizing Provider  cholecalciferol (VITAMIN D) 1000 units tablet Take 2,000 Units by mouth daily.   Yes [provider]  donepezil (ARICEPT) 5 MG tablet Take 5 mg by mouth at bedtime. 12/28/20  Yes [provider]  dorzolamide-timolol (COSOPT) 22.3-6.8 MG/ML ophthalmic solution 1 drop 2 (two) times daily. 12/22/20  Yes [provider]  ezetimibe (ZETIA) 10 MG tablet Take 10 mg by mouth daily. 06/09/19  Yes [provider]  glipiZIDE (GLUCOTROL) 5 MG tablet TAKE 1 TABLET BY MOUTH TWICE A DAY 01/05/21  Yes Glean Hess, MD  hydrOXYzine (ATARAX/VISTARIL) 25 MG tablet Take 0.5-1 tablets (12.5-25 mg total) by mouth every 8 (eight) hours as needed for anxiety. 12/11/20  Yes Glean Hess, MD  JANUVIA 100 MG tablet TAKE 1 TABLET BY MOUTH EVERY DAY 01/05/21  Yes Glean Hess, MD  LEVEMIR FLEXTOUCH 100 UNIT/ML FlexPen INJECT 30 UNITS INTO THE SKIN DAILY. 09/22/20  Yes Glean Hess, MD  lisinopril (ZESTRIL) 40 MG tablet TAKE 1 TABLET BY MOUTH EVERY DAY 01/18/21  Yes Glean Hess, MD  LUMIGAN 0.01 % SOLN TAKE 1 DROP(S) IN BOTH EYES ONCE IN THE EVENING 03/01/15  Yes [provider]  nortriptyline (PAMELOR) 10 MG capsule Take by mouth. 12/28/20  Yes [provider]  pioglitazone (ACTOS) 15 MG tablet TAKE 1 TABLET BY MOUTH EVERY DAY 11/13/20  Yes Glean Hess, MD  rosuvastatin (CRESTOR) 40 MG tablet Take 1 tablet (40 mg total) by  mouth daily. 08/16/20  Yes Glean Hess, MD  spironolactone (ALDACTONE) 25 MG tablet Take 25 mg by mouth daily. 06/27/20  Yes [provider]  timolol (TIMOPTIC) 0.5 % ophthalmic solution Place 1 drop into both eyes. 02/15/16  Yes [provider]  traZODone (DESYREL) 50 MG tablet TAKE 1 TABLET BY MOUTH EVERYDAY AT BEDTIME 07/23/20  Yes Glean Hess, MD  B-D UF III MINI PEN NEEDLES 31G X 5 MM MISC USE AS DIRECTED WITH LEVEMIR 09/25/20   Glean Hess, MD  glucose blood test strip Use to test blood sugar up to twice daily. 10/08/19   Glean Hess, MD  albuterol [facility] HFA) [phone] Base) MCG/ACT inhaler Inhale 2 puffs into the lungs every 6 (six) hours as needed for wheezing or shortness of breath. 02/18/19 12/30/19  Glean Hess, MD    Family History Family History  Problem Relation Age of Onset  . Cancer Mother        Breast  . Breast cancer Mother   . Heart disease Father   . Heart attack Brother   . Cancer Maternal Aunt        breast  . Breast cancer Maternal Aunt   . Breast cancer Cousin        mat cousin    Social History Social History   Tobacco Use  . Smoking status: Former Smoker    Packs/day: 1.00    Years: 7.00    Pack years: 7.00    Types: Cigarettes    Quit date: 1965    Years since quitting: 57.2  . Smokeless tobacco: Never Used  . Tobacco comment: smoking cessation materials not required  Vaping Use  . Vaping Use: Never used  Substance Use Topics  . Alcohol use: No  . Drug use: No     Allergies   Amoxicillin, Aspirin, Metformin and related, Oxycodone, and Sulfa antibiotics   Review of Systems Review of Systems  Constitutional: Negative.   Musculoskeletal:       Leg cramps.   Physical Exam Triage Vital Signs ED Triage Vitals  Enc Vitals Group     BP 01/22/21 0923 136/61     Pulse Rate 01/22/21 0923 91     Resp 01/22/21 0923 18     Temp 01/22/21 0923 98.1 F (36.7 C)     Temp Source 01/22/21 0923 Oral      SpO2 01/22/21 0923 100 %     Weight 01/22/21 0920 182 lb (82.6 kg)     Height 01/22/21 0920 5\' 9"  (1.753 m)     Head Circumference --      Peak Flow --      Pain Score 01/22/21 0920 10     Pain Loc --      Pain Edu? --      Excl. in [place]? --    Updated Vital Signs BP 136/61 (BP Location: Left Arm)   Pulse  91   Temp 98.1 F (36.7 C) (Oral)   Resp 18   Ht 5\' 9"  (1.753 m)   Wt 82.6 kg   SpO2 100%   BMI 26.88 kg/m   Visual Acuity Right Eye Distance:   Left Eye Distance:   Bilateral Distance:    Right Eye Near:   Left Eye Near:    Bilateral Near:     Physical Exam Vitals and nursing note reviewed.  Constitutional:      General: She is not in acute distress.    Appearance: Normal appearance. She is not ill-appearing.  HENT:     Head: Normocephalic and atraumatic.  Eyes:     General:        Right eye: No discharge.        Left eye: No discharge.     Conjunctiva/sclera: Conjunctivae normal.  Cardiovascular:     Rate and Rhythm: Normal rate.  Pulmonary:     Effort: Pulmonary effort is normal. No respiratory distress.     Breath sounds: Normal breath sounds.  Neurological:     Mental Status: She is alert.  Psychiatric:        Mood and Affect: Mood normal.        Behavior: Behavior normal.    UC Treatments / Results  Labs (all labs ordered are listed, but only abnormal results are displayed) Labs Reviewed - No data to display  EKG   Radiology No results found.  Procedures Procedures (including critical care time)  Medications Ordered in UC Medications - No data to display  Initial Impression / Assessment and Plan / UC Course  I have reviewed the triage vital signs and the nursing notes.  Pertinent labs & imaging results that were available during my care of the patient were reviewed by me and considered in my medical decision making (see chart for details).    80 year old female presents with nocturnal leg cramps.  This is a known side effect of  recently started donepezil.  Advised her to stop the medication.  Advised to stay hydrated and stretch before bed.  She is to inform her neurologist about stopping the medication.  Final Clinical Impressions(s) / UC Diagnoses   Final diagnoses:  Nocturnal leg cramps  Medication side effect     Discharge Instructions     Stay hydrated.  Stretch before bed.  Stop the Donepezil & inform neurology.  Take care  Dr. Lacinda Axon    ED Prescriptions    None     PDMP not reviewed this encounter.   Coral Spikes, [place] 01/22/21 1023

## 2021-02-03 ENCOUNTER — Other Ambulatory Visit: Payer: Self-pay | Admitting: Internal Medicine

## 2021-02-03 NOTE — Telephone Encounter (Signed)
Requested medications are due for refill today yes  Requested medications are on the active medication list yes  Last refill 1/4  Last visit I do not see a visit that addresses this med/dx.  Future visit scheduled 07/2021  Notes to clinic Please assess.

## 2021-02-12 ENCOUNTER — Ambulatory Visit: Payer: Self-pay | Admitting: *Deleted

## 2021-02-12 NOTE — Telephone Encounter (Signed)
Pt called c/o being dizzy and weak and [place].  She was difficult to triage as her answers were vague and short.  I spoke in very simple language to facilitate her understanding. She has diabetes and uses insulin.  She has not checked her glucose this morning or taken her insulin.   She mentioned "every time I get up I'm dizzy".   "I hold onto the chair for a few minutes until the dizziness goes away".    "I'm so weak".  The protocol is to be seen within 24 hrs however pt did not want to come in.   There was an appt today at 1:40 with DrArmy Melia however she said,  "I'm too tired to mess with coming in there today".  She preferred Tuesday or Wednesday.   No appts on Tues. But made her an appt for Wednesday 02/14/2021 at 8:00 with Dr. Army Melia.      Covid questionnaire completed.   She asked me,  "When did they start this survey thing where I have to answer all these questions just for an appt?"  I let her know I ask those triage questions so we know what kind of appt she needs.   She said,  "Oh ok".    I sent my notes to [facility].    Reason for Disposition . [1] MODERATE dizziness (e.g., interferes with normal activities) AND [2] has NOT been evaluated by physician for this  (Exception: dizziness caused by heat exposure, sudden standing, or poor fluid intake)  Answer Assessment - Initial Assessment Questions 1. DESCRIPTION: "Describe your dizziness."     I'm dizzy, [place] and weak.   I have diabetes.   My sugar has been up 111.   Normally it's 90-95-100. 2. LIGHTHEADED: "Do you feel lightheaded?" (e.g., somewhat faint, woozy, weak upon standing)     Not feel like passing out.   I just feel tired all the time.  It's getting worse over the last 2-3 days.   I use insulin.   I'm not checking my sugar or taking my insulin.    I will take it in a few minutes.  I usually do it 10:45-11:45.    I ate this morning so I didn't check my sugar.   I ate breakfast at 10:30.    When I first  get up I'm real dizzy.   I get dizzy when I stand up.   I will just stand there and hold onto the chair until the dizziness goes away.   "Just a few minutes"    Every time I stand up.   Happening for 2 days.    No diarrhea or vomiting.   3. VERTIGO: "Do you feel like either you or the room is spinning or tilting?" (i.e. vertigo)     No 4. SEVERITY: "How bad is it?"  "Do you feel like you are going to faint?" "Can you stand and walk?"   - MILD: Feels slightly dizzy, but walking normally.   - MODERATE: Feels very unsteady when walking, but not falling; interferes with normal activities (e.g., school, work) .   - SEVERE: Unable to walk without falling, or requires assistance to walk without falling; feels like passing out now.      Moderate.   See above    Denies any falls. 5. ONSET:  "When did the dizziness begin?"     2-3 days ago 6. AGGRAVATING FACTORS: "Does anything make it worse?" (e.g., standing, change in head position)  Every time I stand up or get up 7. HEART RATE: "Can you tell me your heart rate?" "How many beats in 15 seconds?"  (Note: not all patients can do this)       Did not ask Denies heart problems. 8. CAUSE: "What do you think is causing the dizziness?"     I don't know 9. RECURRENT SYMPTOM: "Have you had dizziness before?" If Yes, ask: "When was the last time?" "What happened that time?"     No 10. OTHER SYMPTOMS: "Do you have any other symptoms?" (e.g., fever, chest pain, vomiting, diarrhea, bleeding)       No diarrhea, chest pain, shortness of breath or vomiting.  Denies any illnesses. 11. PREGNANCY: "Is there any chance you are pregnant?" "When was your last menstrual period?"       N/A due to age  Protocols used: DIZZINESS [facility]

## 2021-02-12 NOTE — Telephone Encounter (Signed)
Noted pt schedule an appt 02/14/2021 and refused an earlier appt.  KP

## 2021-02-14 ENCOUNTER — Ambulatory Visit (INDEPENDENT_AMBULATORY_CARE_PROVIDER_SITE_OTHER): Payer: Medicare Other | Admitting: Internal Medicine

## 2021-02-14 ENCOUNTER — Encounter: Payer: Self-pay | Admitting: Internal Medicine

## 2021-02-14 ENCOUNTER — Other Ambulatory Visit: Payer: Self-pay

## 2021-02-14 VITALS — BP 112/60 | HR 86 | Temp 98.1°F | Ht 69.0 in | Wt 185.0 lb

## 2021-02-14 DIAGNOSIS — E118 Type 2 diabetes mellitus with unspecified complications: Secondary | ICD-10-CM | POA: Diagnosis not present

## 2021-02-14 DIAGNOSIS — R42 Dizziness and giddiness: Secondary | ICD-10-CM

## 2021-02-14 NOTE — Progress Notes (Signed)
Date:  02/14/2021   Name:  Cathy Murphy   DOB:  01-09-1941   MRN:  [id number]   Chief Complaint: Dizziness (X2-3 days, happens when pt stands up, feels light headed, last blood sugar was 209 yesterday ) and Fatigue (X2-3 days, sleeping more than usual )  Dizziness This is a new problem. The current episode started in the past 7 days. Associated symptoms include fatigue. Pertinent negatives include no arthralgias, chest pain, chills, diaphoresis, fever, headaches or nausea. The symptoms are aggravated by standing. She has tried nothing for the symptoms.  Diabetes She presents for her follow-up diabetic visit. She has type 2 diabetes mellitus. Hypoglycemia symptoms include dizziness. Pertinent negatives for hypoglycemia include no headaches. Associated symptoms include fatigue. Pertinent negatives for diabetes include no chest pain. She monitors blood glucose at home 1-2 x per day. Her home blood glucose trend is increasing steadily.    Lab Results  Component Value Date   CREATININE 1.35 (H) 08/16/2020   BUN 20 08/16/2020   NA 143 08/16/2020   K 4.6 08/16/2020   CL 103 08/16/2020   CO2 27 08/16/2020   Lab Results  Component Value Date   CHOL 175 05/16/2020   HDL 55 05/16/2020   LDLCALC 103 (H) 05/16/2020   TRIG 92 05/16/2020   CHOLHDL 3.2 05/16/2020   Lab Results  Component Value Date   TSH 1.520 12/11/2020   Lab Results  Component Value Date   HGBA1C 7.5 (A) 12/11/2020   HGBA1C 0 12/11/2020   HGBA1C 0 (A) 12/11/2020   HGBA1C 0.0 12/11/2020   Lab Results  Component Value Date   WBC 5.3 05/16/2020   HGB 14.7 05/16/2020   HCT 46.0 05/16/2020   MCV 91 05/16/2020   PLT 211 05/16/2020   Lab Results  Component Value Date   ALT 12 05/16/2020   AST 13 05/16/2020   ALKPHOS 88 05/16/2020   BILITOT 1.3 (H) 05/16/2020     Review of Systems  Constitutional: Positive for fatigue. Negative for chills, diaphoresis and fever.  HENT: Negative for trouble swallowing.    Eyes: Positive for visual disturbance.  Respiratory: Negative for chest tightness, shortness of breath and wheezing.   Cardiovascular: Negative for chest pain and palpitations.  Gastrointestinal: Negative for constipation, diarrhea and nausea.  Musculoskeletal: Negative for arthralgias and gait problem.  Neurological: Positive for dizziness and light-headedness. Negative for headaches.  Psychiatric/Behavioral: Positive for sleep disturbance (sleeping more).    Patient Active Problem List   Diagnosis Date Noted  . Difficulty sleeping 12/29/2020  . Memory loss or impairment 12/29/2020  . Headache disorder 12/29/2020  . Stage 3a chronic kidney disease ([place]) 08/16/2020  . Osteopenia determined by x-ray 08/05/2019  . Atherosclerosis of abdominal aorta ([place]) 06/09/2019  . Coronary artery disease involving native coronary artery of native heart 06/09/2019  . BMI 24.0-24.9, adult 11/06/2018  . Bilateral neck pain 10/08/2018  . Aortic calcification ([place]) 03/05/2018  . 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 Ventral hernia without obstruction or gangrene 03/06/2016  . Type II diabetes mellitus with complication ([place]) 69/62/9528  . Plantar fasciitis, right 06/16/2015  . Knee torn cartilage 04/26/2015  . Allergic rhinitis, seasonal 04/26/2015  . Essential (primary) hypertension 04/26/2015  . Glaucoma 04/26/2015  . Hyperlipidemia associated with type 2 diabetes mellitus ([place]) 04/26/2015  . Insomnia 04/26/2015    Allergies  Allergen Reactions  . Amoxicillin Diarrhea  . Aricept [Donepezil] Other (See Comments)    Leg cramps  . Aspirin Nausea And  Vomiting  . Metformin And Related Diarrhea  . Oxycodone Other (See Comments)    CNS side effects/agitation  . Sulfa Antibiotics     Past Surgical History:  Procedure Laterality Date  . ABDOMINAL HYSTERECTOMY    . COLONOSCOPY WITH PROPOFOL N/A 11/25/2017   Procedure: COLONOSCOPY WITH PROPOFOL;  Surgeon: [name], MD;  Location: [facility]  ENDOSCOPY;  Service: Endoscopy;  Laterality: N/A;  . LIPOMA RESECTION     right posterior neck    Social History   Tobacco Use  . Smoking status: Former Smoker    Packs/day: 1.00    Years: 7.00    Pack years: 7.00    Types: Cigarettes    Quit date: 1965    Years since quitting: 57.3  . Smokeless tobacco: Never Used  . Tobacco comment: smoking cessation materials not required  Vaping Use  . Vaping Use: Never used  Substance Use Topics  . Alcohol use: No  . Drug use: No     Medication list has been reviewed and updated.  Current Meds  Medication Sig  . B-D UF III MINI PEN NEEDLES 31G X 5 MM MISC USE AS DIRECTED WITH LEVEMIR  . cholecalciferol (VITAMIN D) 1000 units tablet Take 2,000 Units by mouth daily.  . dorzolamide-timolol (COSOPT) 22.3-6.8 MG/ML ophthalmic solution 1 drop 2 (two) times daily.  Marland Kitchen ezetimibe (ZETIA) 10 MG tablet Take 10 mg by mouth daily.  Marland Kitchen glipiZIDE (GLUCOTROL) 5 MG tablet TAKE 1 TABLET BY MOUTH TWICE A DAY  . glucose blood test strip Use to test blood sugar up to twice daily.  . hydrOXYzine (ATARAX/VISTARIL) 25 MG tablet Take 0.5-1 tablets (12.5-25 mg total) by mouth every 8 (eight) hours as needed for anxiety.  Marland Kitchen JANUVIA 100 MG tablet TAKE 1 TABLET BY MOUTH EVERY DAY  . LEVEMIR FLEXTOUCH 100 UNIT/ML FlexPen INJECT 30 UNITS INTO THE SKIN DAILY.  Marland Kitchen lisinopril (ZESTRIL) 40 MG tablet TAKE 1 TABLET BY MOUTH EVERY DAY  . LUMIGAN 0.01 % SOLN TAKE 1 DROP(S) IN BOTH EYES ONCE IN THE EVENING  . memantine (NAMENDA) 5 MG tablet Take 5 mg by mouth daily.  . nortriptyline (PAMELOR) 10 MG capsule Take by mouth.  . pioglitazone (ACTOS) 15 MG tablet TAKE 1 TABLET BY MOUTH EVERY DAY  . rosuvastatin (CRESTOR) 40 MG tablet Take 1 tablet (40 mg total) by mouth daily.  Marland Kitchen spironolactone (ALDACTONE) 25 MG tablet Take 25 mg by mouth daily.  . timolol (TIMOPTIC) 0.5 % ophthalmic solution Place 1 drop into both eyes.  . traZODone (DESYREL) 50 MG tablet TAKE 1 TABLET BY MOUTH  EVERYDAY AT BEDTIME    PHQ 2/9 Scores 02/14/2021 12/11/2020 08/16/2020 07/12/2020  PHQ - 2 Score 6 0 4 0  PHQ- 9 Score 9 9 8  -    GAD 7 : Generalized Anxiety Score 02/14/2021 12/11/2020 08/16/2020 05/16/2020  Nervous, Anxious, on Edge 0 1 0 1  Control/stop worrying 0 0 0 1  Worry too much - different things 0 0 0 1  Trouble relaxing 0 3 0 1  Restless 0 0 0 0  Easily annoyed or irritable 0 0 0 1  Afraid - awful might happen 0 0 0 0  Total GAD 7 Score 0 4 0 5  Anxiety Difficulty - Not difficult at all Not difficult at all Not difficult at all    BP Readings from Last 3 Encounters:  02/14/21 112/60  01/22/21 136/61  12/11/20 128/64    Physical Exam Vitals and nursing  note reviewed.  Constitutional:      General: She is not in acute distress.    Appearance: Normal appearance. She is well-developed.  HENT:     Head: Normocephalic and atraumatic.  Cardiovascular:     Rate and Rhythm: Normal rate and regular rhythm.     Pulses: Normal pulses.     Heart sounds: No murmur heard.   Pulmonary:     Effort: Pulmonary effort is normal. No respiratory distress.     Breath sounds: No wheezing or rhonchi.  Musculoskeletal:     Cervical back: Normal range of motion.     Right lower leg: No edema.     Left lower leg: No edema.  Lymphadenopathy:     Cervical: No cervical adenopathy.  Skin:    General: Skin is warm and dry.     Findings: No rash.  Neurological:     Mental Status: She is alert and oriented to person, place, and time.  Psychiatric:        Mood and Affect: Mood normal.        Behavior: Behavior normal.     Wt Readings from Last 3 Encounters:  02/14/21 185 lb (83.9 kg)  01/22/21 182 lb (82.6 kg)  12/11/20 183 lb (83 kg)    BP 112/60   Pulse 86   Temp 98.1 F (36.7 C) (Oral)   Ht 5\' 9"  (1.753 m)   Wt 185 lb (83.9 kg)   SpO2 99%   BMI 27.32 kg/m   Assessment and Plan: 1. Dizziness Suspect side effect to Namenda and/or Pamelor Will discontinue both.  Continue  fluids. Expect improvement over the next few days. Will rule out metabolic issues with labs today. - CBC with Differential/Platelet - Comprehensive metabolic panel  2. Type II diabetes mellitus with complication (HCC) Continue to take medications as prescribed - Comprehensive metabolic panel   Partially dictated using Dragon software. Any errors are unintentional.  Halina Maidens, MD [facility]  02/14/2021

## 2021-02-15 ENCOUNTER — Other Ambulatory Visit: Payer: Self-pay | Admitting: Internal Medicine

## 2021-02-15 DIAGNOSIS — F419 Anxiety disorder, unspecified: Secondary | ICD-10-CM

## 2021-02-15 LAB — COMPREHENSIVE METABOLIC PANEL
ALT: 28 IU/L (ref 0–32)
AST: 21 IU/L (ref 0–40)
Albumin/Globulin Ratio: 1.8 (ref 1.2–2.2)
Albumin: 4.3 g/dL (ref 3.7–4.7)
Alkaline Phosphatase: 94 IU/L (ref 44–121)
BUN/Creatinine Ratio: 13 (ref 12–28)
BUN: 22 mg/dL (ref 8–27)
Bilirubin Total: 1.2 mg/dL (ref 0.0–1.2)
CO2: 24 mmol/L (ref 20–29)
Calcium: 9.4 mg/dL (ref 8.7–10.3)
Chloride: 100 mmol/L (ref 96–106)
Creatinine, Ser: 1.72 mg/dL — ABNORMAL HIGH (ref 0.57–1.00)
Globulin, Total: 2.4 g/dL (ref 1.5–4.5)
Glucose: 189 mg/dL — ABNORMAL HIGH (ref 65–99)
Potassium: 4.4 mmol/L (ref 3.5–5.2)
Sodium: 140 mmol/L (ref 134–144)
Total Protein: 6.7 g/dL (ref 6.0–8.5)
eGFR: 30 mL/min/{1.73_m2} — ABNORMAL LOW (ref >59)

## 2021-02-15 LAB — CBC WITH DIFFERENTIAL/PLATELET
Basophils Absolute: 0 10*3/uL (ref 0.0–0.2)
Basos: 1 %
EOS (ABSOLUTE): 0.1 10*3/uL (ref 0.0–0.4)
Eos: 1 %
Hematocrit: 42.8 % (ref 34.0–46.6)
Hemoglobin: 13.7 g/dL (ref 11.1–15.9)
Immature Grans (Abs): 0 10*3/uL (ref 0.0–0.1)
Immature Granulocytes: 0 %
Lymphocytes Absolute: 2.1 10*3/uL (ref 0.7–3.1)
Lymphs: 30 %
MCH: 29 pg (ref 26.6–33.0)
MCHC: 32 g/dL (ref 31.5–35.7)
MCV: 91 fL (ref 79–97)
Monocytes Absolute: 0.4 10*3/uL (ref 0.1–0.9)
Monocytes: 6 %
Neutrophils Absolute: 4.3 10*3/uL (ref 1.4–7.0)
Neutrophils: 62 %
Platelets: 199 10*3/uL (ref 150–450)
RBC: 4.73 x10E6/uL (ref 3.77–5.28)
RDW: 12.5 % (ref 11.7–15.4)
WBC: 6.9 10*3/uL (ref 3.4–10.8)

## 2021-02-20 DIAGNOSIS — H401132 Primary open-angle glaucoma, bilateral, moderate stage: Secondary | ICD-10-CM | POA: Diagnosis not present

## 2021-03-13 DIAGNOSIS — H401132 Primary open-angle glaucoma, bilateral, moderate stage: Secondary | ICD-10-CM | POA: Diagnosis not present

## 2021-03-15 ENCOUNTER — Other Ambulatory Visit: Payer: Self-pay | Admitting: Internal Medicine

## 2021-03-15 DIAGNOSIS — F419 Anxiety disorder, unspecified: Secondary | ICD-10-CM

## 2021-04-02 ENCOUNTER — Other Ambulatory Visit: Payer: Self-pay | Admitting: Internal Medicine

## 2021-04-02 DIAGNOSIS — E785 Hyperlipidemia, unspecified: Secondary | ICD-10-CM

## 2021-04-02 DIAGNOSIS — E1169 Type 2 diabetes mellitus with other specified complication: Secondary | ICD-10-CM

## 2021-04-16 ENCOUNTER — Other Ambulatory Visit: Payer: Self-pay | Admitting: Internal Medicine

## 2021-04-17 ENCOUNTER — Encounter: Payer: Self-pay | Admitting: Internal Medicine

## 2021-04-17 ENCOUNTER — Other Ambulatory Visit: Payer: Self-pay

## 2021-04-17 ENCOUNTER — Ambulatory Visit (INDEPENDENT_AMBULATORY_CARE_PROVIDER_SITE_OTHER): Payer: Medicare Other | Admitting: Internal Medicine

## 2021-04-17 VITALS — BP 106/68 | HR 82 | Temp 98.9°F | Ht 69.0 in | Wt 184.0 lb

## 2021-04-17 DIAGNOSIS — E1169 Type 2 diabetes mellitus with other specified complication: Secondary | ICD-10-CM | POA: Diagnosis not present

## 2021-04-17 DIAGNOSIS — I10 Essential (primary) hypertension: Secondary | ICD-10-CM

## 2021-04-17 DIAGNOSIS — E785 Hyperlipidemia, unspecified: Secondary | ICD-10-CM | POA: Diagnosis not present

## 2021-04-17 DIAGNOSIS — G3184 Mild cognitive impairment, so stated: Secondary | ICD-10-CM

## 2021-04-17 DIAGNOSIS — E118 Type 2 diabetes mellitus with unspecified complications: Secondary | ICD-10-CM | POA: Diagnosis not present

## 2021-04-17 DIAGNOSIS — F419 Anxiety disorder, unspecified: Secondary | ICD-10-CM

## 2021-04-17 MED ORDER — SPIRONOLACTONE 25 MG PO TABS: 25.0000 mg | ORAL_TABLET | Freq: Every day | ORAL | Status: AC

## 2021-04-17 MED ORDER — HYDROXYZINE HCL 25 MG PO TABS: 12.5000 mg | ORAL_TABLET | Freq: Three times a day (TID) | ORAL | 5 refills | Status: DC | PRN

## 2021-04-17 NOTE — Progress Notes (Signed)
Date:  04/17/2021   Name:  Cathy Murphy   DOB:  May 05, 1941   MRN:  [id number]   Chief Complaint: Diabetes (Blood Sugar- 146 this morning fasting. )  Diabetes She presents for her follow-up diabetic visit. She has type 2 diabetes mellitus. Her disease course has been stable. Pertinent negatives for hypoglycemia include no headaches or tremors. Pertinent negatives for diabetes include no chest pain, no fatigue, no polydipsia and no polyuria. Diabetic complications include nephropathy. Current diabetic treatment includes insulin injections and oral agent (triple therapy) (glipizide, januvia, actos). An ACE inhibitor/angiotensin II receptor blocker is being taken.  Hypertension This is a chronic problem. The problem is controlled. Pertinent negatives include no chest pain, headaches, palpitations or shortness of breath. Past treatments include ACE inhibitors and diuretics. The current treatment provides significant improvement. There are no compliance problems.   Hyperlipidemia This is a chronic problem. The problem is controlled. Pertinent negatives include no chest pain or shortness of breath. Current antihyperlipidemic treatment includes statins.  MCI - seen by neurology.  Started Aricept and Namendar ecently but stopped due to side effects. She has not had any further issues with confusion, getting lost while driving, etc.   Lab Results  Component Value Date   CREATININE 1.72 (H) 02/14/2021   BUN 22 02/14/2021   NA 140 02/14/2021   K 4.4 02/14/2021   CL 100 02/14/2021   CO2 24 02/14/2021   Lab Results  Component Value Date   CHOL 175 05/16/2020   HDL 55 05/16/2020   LDLCALC 103 (H) 05/16/2020   TRIG 92 05/16/2020   CHOLHDL 3.2 05/16/2020   Lab Results  Component Value Date   TSH 1.520 12/11/2020   Lab Results  Component Value Date   HGBA1C 7.5 (A) 12/11/2020   HGBA1C 0 12/11/2020   HGBA1C 0 (A) 12/11/2020   HGBA1C 0.0 12/11/2020   Lab Results  Component Value Date    WBC 6.9 02/14/2021   HGB 13.7 02/14/2021   HCT 42.8 02/14/2021   MCV 91 02/14/2021   PLT 199 02/14/2021   Lab Results  Component Value Date   ALT 28 02/14/2021   AST 21 02/14/2021   ALKPHOS 94 02/14/2021   BILITOT 1.2 02/14/2021     Review of Systems  Constitutional:  Negative for appetite change, fatigue, fever and unexpected weight change.  HENT:  Negative for tinnitus and trouble swallowing.   Eyes:  Negative for visual disturbance.  Respiratory:  Negative for cough, chest tightness and shortness of breath.   Cardiovascular:  Negative for chest pain, palpitations and leg swelling.  Gastrointestinal:  Negative for abdominal pain.  Endocrine: Negative for polydipsia and polyuria.  Genitourinary:  Negative for dysuria and hematuria.  Musculoskeletal:  Negative for arthralgias.  Neurological:  Negative for tremors, numbness and headaches.  Psychiatric/Behavioral:  Negative for dysphoric mood.    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) 94/17/4081   Plantar  fasciitis, right 06/16/2015   Knee torn cartilage 04/26/2015   Allergic rhinitis, seasonal 04/26/2015   Essential (primary) hypertension 04/26/2015   Glaucoma 04/26/2015   Hyperlipidemia associated with type 2 diabetes mellitus ([place]) 04/26/2015   Insomnia 04/26/2015    Allergies  Allergen Reactions   Amoxicillin Diarrhea    Aricept [Donepezil] Other (See Comments)    Leg cramps   Aspirin Nausea And Vomiting   Metformin And Related Diarrhea   Oxycodone Other (See Comments)    CNS side effects/agitation   Sulfa Antibiotics     Past Surgical History:  Procedure Laterality Date   ABDOMINAL HYSTERECTOMY     COLONOSCOPY WITH PROPOFOL N/A 11/25/2017   Procedure: COLONOSCOPY WITH PROPOFOL;  Surgeon: [name], MD;  Location: [facility] ENDOSCOPY;  Service: Endoscopy;  Laterality: N/A;   LIPOMA RESECTION     right posterior neck    Social History   Tobacco Use   Smoking status: Former    Packs/day: 1.00    Years: 7.00    Pack years: 7.00    Types: Cigarettes    Quit date: 1965    Years since quitting: 57.4   Smokeless tobacco: Never   Tobacco comments:    smoking cessation materials not required  Vaping Use   Vaping Use: Never used  Substance Use Topics   Alcohol use: No   Drug use: No     Medication list has been reviewed and updated.  Current Meds  Medication Sig   B-D UF III MINI PEN NEEDLES 31G X 5 MM MISC USE AS DIRECTED WITH LEVEMIR   cholecalciferol (VITAMIN D) 1000 units tablet Take 2,000 Units by mouth daily.   donepezil (ARICEPT) 5 MG tablet Take 5 mg by mouth at bedtime.   ezetimibe (ZETIA) 10 MG tablet Take 10 mg by mouth daily.   glipiZIDE (GLUCOTROL) 5 MG tablet TAKE 1 TABLET BY MOUTH TWICE A DAY   glucose blood test strip Use to test blood sugar up to twice daily.   hydrOXYzine (ATARAX/VISTARIL) 25 MG tablet TAKE 0.5-1 TABLETS (12.5-25 MG TOTAL) BY MOUTH EVERY 8 (EIGHT) HOURS AS NEEDED FOR ANXIETY.   JANUVIA 100 MG tablet TAKE 1 TABLET BY MOUTH EVERY DAY   LEVEMIR FLEXTOUCH 100 UNIT/ML FlexPen INJECT 30 UNITS INTO THE SKIN DAILY.   lisinopril (ZESTRIL) 40 MG tablet TAKE 1 TABLET BY MOUTH EVERY DAY   LUMIGAN 0.01 % SOLN TAKE 1 DROP(S) IN BOTH EYES ONCE IN THE EVENING   pioglitazone (ACTOS) 15 MG tablet TAKE 1 TABLET BY MOUTH EVERY DAY   rosuvastatin (CRESTOR) 40 MG tablet  TAKE 1 TABLET BY MOUTH EVERY DAY   spironolactone (ALDACTONE) 25 MG tablet Take 25 mg by mouth daily.   timolol (TIMOPTIC) 0.5 % ophthalmic solution Place 1 drop into both eyes.   traZODone (DESYREL) 50 MG tablet TAKE 1 TABLET BY MOUTH EVERYDAY AT BEDTIME   [DISCONTINUED] dorzolamide-timolol (COSOPT) 22.3-6.8 MG/ML ophthalmic solution 1 drop 2 (two) times daily.    PHQ 2/9 Scores 04/17/2021 02/14/2021 12/11/2020 08/16/2020  PHQ - 2 Score 3 6 0 4  PHQ- 9 Score 7 9 9 8     GAD 7 : Generalized Anxiety Score 04/17/2021 02/14/2021 12/11/2020 08/16/2020  Nervous, Anxious, on Edge 1 0 1 0  Control/stop worrying 0 0 0 0  Worry too much - different things 1 0 0 0  Trouble relaxing 1 0 3 0  Restless 0 0 0 0  Easily annoyed or irritable 0 0 0 0  Afraid -  awful might happen 0 0 0 0  Total GAD 7 Score 3 0 4 0  Anxiety Difficulty Not difficult at all - Not difficult at all Not difficult at all    BP Readings from Last 3 Encounters:  04/17/21 106/68  02/14/21 112/60  01/22/21 136/61    Physical Exam Vitals and nursing note reviewed.  Constitutional:      General: She is not in acute distress.    Appearance: She is well-developed.  HENT:     Head: Normocephalic and atraumatic.  Cardiovascular:     Rate and Rhythm: Normal rate and regular rhythm.     Pulses: Normal pulses.  Pulmonary:     Effort: Pulmonary effort is normal. No respiratory distress.     Breath sounds: No wheezing or rhonchi.  Musculoskeletal:     Right lower leg: No edema.     Left lower leg: No edema.  Skin:    General: Skin is warm and dry.     Findings: No rash.  Neurological:     Mental Status: She is alert and oriented to person, place, and time.  Psychiatric:        Mood and Affect: Mood normal.        Behavior: Behavior normal.    Wt Readings from Last 3 Encounters:  04/17/21 184 lb (83.5 kg)  02/14/21 185 lb (83.9 kg)  01/22/21 182 lb (82.6 kg)    BP 106/68   Pulse 82   Temp 98.9 F (37.2 C) (Oral)   Ht  5\' 9"  (1.753 m)   Wt 184 lb (83.5 kg)   SpO2 98%   BMI 27.17 kg/m   Assessment and Plan: 1. Type II diabetes mellitus with complication (HCC) Clinically stable by exam and report without s/s of hypoglycemia. DM complicated by hypertension and dyslipidemia. Tolerating medications well without side effects or other concerns. - Hemoglobin A1c  2. Essential (primary) hypertension Clinically stable exam with well controlled BP. Tolerating medications without side effects at this time. Repeat BMP for decreased GFR last visit Pt to continue current regimen and low sodium diet; benefits of regular exercise as able discussed. - Basic metabolic panel - spironolactone (ALDACTONE) 25 MG tablet; Take 1 tablet (25 mg total) by mouth daily.  Dispense: 30 tablet  3. Hyperlipidemia associated with type 2 diabetes mellitus ([place]) On statin therapy - no lipid panel in one year - Lipid panel  4. Mild cognitive impairment Stabilized without current issues Pt declines further evaluation or medication trials at this time  5. Anxiety - hydrOXYzine (ATARAX/VISTARIL) 25 MG tablet; Take 0.5-1 tablets (12.5-25 mg total) by mouth every 8 (eight) hours as needed for anxiety.  Dispense: 30 tablet; Refill: 5   Partially dictated using Editor, commissioning. Any errors are unintentional.  Halina Maidens, MD [facility]  04/17/2021

## 2021-04-18 LAB — BASIC METABOLIC PANEL
BUN/Creatinine Ratio: 13 (ref 12–28)
BUN: 17 mg/dL (ref 8–27)
CO2: 23 mmol/L (ref 20–29)
Calcium: 9.4 mg/dL (ref 8.7–10.3)
Chloride: 102 mmol/L (ref 96–106)
Creatinine, Ser: 1.31 mg/dL — ABNORMAL HIGH (ref 0.57–1.00)
Glucose: 148 mg/dL — ABNORMAL HIGH (ref 65–99)
Potassium: 4 mmol/L (ref 3.5–5.2)
Sodium: 140 mmol/L (ref 134–144)
eGFR: 42 mL/min/{1.73_m2} — ABNORMAL LOW (ref >59)

## 2021-04-18 LAB — HEMOGLOBIN A1C
Est. average glucose Bld gHb Est-mCnc: 258 mg/dL
Hgb A1c MFr Bld: 10.6 % — ABNORMAL HIGH (ref 4.8–5.6)

## 2021-04-18 LAB — LIPID PANEL
Chol/HDL Ratio: 3 ratio (ref 0.0–4.4)
Cholesterol, Total: 153 mg/dL (ref 100–199)
HDL: 51 mg/dL (ref >39)
LDL Chol Calc (NIH): 81 mg/dL (ref 0–99)
Triglycerides: 115 mg/dL (ref 0–149)
VLDL Cholesterol Cal: 21 mg/dL (ref 5–40)

## 2021-04-19 ENCOUNTER — Other Ambulatory Visit: Payer: Self-pay

## 2021-04-19 DIAGNOSIS — E118 Type 2 diabetes mellitus with unspecified complications: Secondary | ICD-10-CM

## 2021-04-19 MED ORDER — LEVEMIR FLEXTOUCH 100 UNIT/ML ~~LOC~~ SOPN: 40.0000 [IU] | PEN_INJECTOR | Freq: Every day | SUBCUTANEOUS | 1 refills | Status: DC

## 2021-05-14 ENCOUNTER — Other Ambulatory Visit: Payer: Self-pay | Admitting: Internal Medicine

## 2021-05-14 DIAGNOSIS — E119 Type 2 diabetes mellitus without complications: Secondary | ICD-10-CM

## 2021-05-14 DIAGNOSIS — Z794 Long term (current) use of insulin: Secondary | ICD-10-CM

## 2021-05-22 ENCOUNTER — Other Ambulatory Visit: Payer: Self-pay

## 2021-05-28 DIAGNOSIS — E118 Type 2 diabetes mellitus with unspecified complications: Secondary | ICD-10-CM | POA: Diagnosis not present

## 2021-05-28 DIAGNOSIS — G8929 Other chronic pain: Secondary | ICD-10-CM | POA: Diagnosis not present

## 2021-05-28 DIAGNOSIS — M545 Low back pain, unspecified: Secondary | ICD-10-CM | POA: Diagnosis not present

## 2021-05-28 DIAGNOSIS — M4807 Spinal stenosis, lumbosacral region: Secondary | ICD-10-CM | POA: Diagnosis not present

## 2021-05-29 ENCOUNTER — Other Ambulatory Visit: Payer: Self-pay | Admitting: Orthopedic Surgery

## 2021-05-29 ENCOUNTER — Other Ambulatory Visit (HOSPITAL_COMMUNITY): Payer: Self-pay | Admitting: Orthopedic Surgery

## 2021-05-29 DIAGNOSIS — M4807 Spinal stenosis, lumbosacral region: Secondary | ICD-10-CM

## 2021-05-29 DIAGNOSIS — G8929 Other chronic pain: Secondary | ICD-10-CM

## 2021-05-29 DIAGNOSIS — M545 Low back pain, unspecified: Secondary | ICD-10-CM

## 2021-06-12 ENCOUNTER — Ambulatory Visit
Admission: RE | Admit: 2021-06-12 | Discharge: 2021-06-12 | Disposition: A | Discharge status: Home / Self Care | Payer: Medicare Other | Source: Ambulatory Visit | Attending: Orthopedic Surgery | Admitting: Orthopedic Surgery

## 2021-06-12 ENCOUNTER — Other Ambulatory Visit: Payer: Self-pay

## 2021-06-12 DIAGNOSIS — M79604 Pain in right leg: Secondary | ICD-10-CM | POA: Diagnosis not present

## 2021-06-12 DIAGNOSIS — G8929 Other chronic pain: Secondary | ICD-10-CM | POA: Diagnosis not present

## 2021-06-12 DIAGNOSIS — M4807 Spinal stenosis, lumbosacral region: Secondary | ICD-10-CM | POA: Diagnosis not present

## 2021-06-12 DIAGNOSIS — M545 Low back pain, unspecified: Secondary | ICD-10-CM | POA: Diagnosis not present

## 2021-06-12 DIAGNOSIS — M79605 Pain in left leg: Secondary | ICD-10-CM | POA: Diagnosis not present

## 2021-06-12 DIAGNOSIS — M5136 Other intervertebral disc degeneration, lumbar region: Secondary | ICD-10-CM | POA: Diagnosis not present

## 2021-06-13 DIAGNOSIS — I700 Atherosclerosis of aorta: Secondary | ICD-10-CM | POA: Diagnosis not present

## 2021-06-13 DIAGNOSIS — I10 Essential (primary) hypertension: Secondary | ICD-10-CM | POA: Diagnosis not present

## 2021-06-13 DIAGNOSIS — I2510 Atherosclerotic heart disease of native coronary artery without angina pectoris: Secondary | ICD-10-CM | POA: Diagnosis not present

## 2021-06-25 DIAGNOSIS — G8929 Other chronic pain: Secondary | ICD-10-CM | POA: Diagnosis not present

## 2021-06-25 DIAGNOSIS — M48062 Spinal stenosis, lumbar region with neurogenic claudication: Secondary | ICD-10-CM | POA: Diagnosis not present

## 2021-06-25 DIAGNOSIS — M5442 Lumbago with sciatica, left side: Secondary | ICD-10-CM | POA: Diagnosis not present

## 2021-06-25 DIAGNOSIS — M5441 Lumbago with sciatica, right side: Secondary | ICD-10-CM | POA: Diagnosis not present

## 2021-07-01 ENCOUNTER — Other Ambulatory Visit: Payer: Self-pay | Admitting: Internal Medicine

## 2021-07-01 DIAGNOSIS — E1169 Type 2 diabetes mellitus with other specified complication: Secondary | ICD-10-CM

## 2021-07-01 NOTE — Telephone Encounter (Signed)
Requested Prescriptions  Pending Prescriptions Disp Refills  . rosuvastatin (CRESTOR) 40 MG tablet [Pharmacy Med Name: ROSUVASTATIN CALCIUM 40 MG TAB] 90 tablet 2    Sig: TAKE 1 TABLET BY MOUTH EVERY DAY     Cardiovascular:  Antilipid - Statins Passed - 07/01/2021 12:47 AM      Passed - Total Cholesterol in normal range and within 360 days    Cholesterol, Total  Date Value Ref Range Status  04/17/2021 153 100 - 199 mg/dL Final         Passed - LDL in normal range and within 360 days    LDL Chol Calc (NIH)  Date Value Ref Range Status  04/17/2021 81 0 - 99 mg/dL Final         Passed - HDL in normal range and within 360 days    HDL  Date Value Ref Range Status  04/17/2021 51 >39 mg/dL Final         Passed - Triglycerides in normal range and within 360 days    Triglycerides  Date Value Ref Range Status  04/17/2021 115 0 - 149 mg/dL Final         Passed - Patient is not pregnant      Passed - Valid encounter within last 12 months    Recent Outpatient Visits          2 months ago Type II diabetes mellitus with complication [facility])   [facility] Glean Hess, MD   4 months ago Dizziness   [facility] Glean Hess, MD   6 months ago Cognitive complaints   [facility] Glean Hess, MD   10 months ago Type II diabetes mellitus with complication [facility])   [facility] Glean Hess, MD   1 year ago Annual physical exam   [facility] Glean Hess, MD      Future Appointments            In 1 month Army Melia Jesse Sans, MD [facility], [facility]

## 2021-07-08 ENCOUNTER — Other Ambulatory Visit: Payer: Self-pay | Admitting: Internal Medicine

## 2021-07-08 DIAGNOSIS — I10 Essential (primary) hypertension: Secondary | ICD-10-CM

## 2021-07-08 NOTE — Telephone Encounter (Signed)
Requested medication (s) are due for refill today: yes to both  Requested medication (s) are on the active medication list: yes  Last refill:  glipizide: 01/05/21 #180 1  Future visit scheduled: yes  Notes to clinic:  abnormal Hgb A1C Abnormal Cr   Requested Prescriptions  Pending Prescriptions Disp Refills   glipiZIDE (GLUCOTROL) 5 MG tablet [Pharmacy Med Name: GLIPIZIDE 5 MG TABLET] 180 tablet 1    Sig: TAKE 1 TABLET BY MOUTH TWICE A DAY     Endocrinology:  Diabetes - Sulfonylureas Failed - 07/08/2021 12:46 AM      Failed - HBA1C is between 0 and 7.9 and within 180 days    HbA1c, POC (prediabetic range)  Date Value Ref Range Status  12/11/2020 0 (A) 5.7 - 6.4 % Final   HbA1c, POC (controlled diabetic range)  Date Value Ref Range Status  12/11/2020 0.0 0.0 - 7.0 % Final   HbA1c POC (<> result, manual entry)  Date Value Ref Range Status  12/11/2020 0 4.0 - 5.6 % Final   Hgb A1c MFr Bld  Date Value Ref Range Status  04/17/2021 10.6 (H) 4.8 - 5.6 % Final    Comment:             Prediabetes: 5.7 - 6.4          Diabetes: >6.4          Glycemic control for adults with diabetes: <7.0           Passed - Valid encounter within last 6 months    Recent Outpatient Visits           2 months ago Type II diabetes mellitus with complication [facility])   [facility] Glean Hess, MD   4 months ago Dizziness   [facility] Glean Hess, MD   6 months ago Cognitive complaints   [facility] Glean Hess, MD   10 months ago Type II diabetes mellitus with complication [facility])   [place], Laura H, MD   1 year ago Annual physical exam   [facility] Glean Hess, MD       Future Appointments             In 1 month Glean Hess, MD [facility]             lisinopril (ZESTRIL) 40 MG tablet [Pharmacy Med Name: LISINOPRIL 40 MG TABLET] 90 tablet 1    Sig: TAKE 1 TABLET BY MOUTH EVERY  DAY     Cardiovascular:  ACE Inhibitors Failed - 07/08/2021 12:46 AM      Failed - Cr in normal range and within 180 days    Creatinine  Date Value Ref Range Status  07/22/2012 0.96 0.60 - 1.30 mg/dL Final   Creatinine, Ser  Date Value Ref Range Status  04/17/2021 1.31 (H) 0.57 - 1.00 mg/dL Final          Passed - K in normal range and within 180 days    Potassium  Date Value Ref Range Status  04/17/2021 4.0 3.5 - 5.2 mmol/L Final  07/22/2012 3.6 3.5 - 5.1 mmol/L Final          Passed - Patient is not pregnant      Passed - Last BP in normal range    BP Readings from Last 1 Encounters:  04/17/21 106/68          Passed - Valid  encounter within last 6 months    Recent Outpatient Visits           2 months ago Type II diabetes mellitus with complication [facility])   [facility] Glean Hess, MD   4 months ago Dizziness   [facility] Glean Hess, MD   6 months ago Cognitive complaints   [facility] Glean Hess, MD   10 months ago Type II diabetes mellitus with complication [facility])   [facility] Glean Hess, MD   1 year ago Annual physical exam   [facility] Glean Hess, MD       Future Appointments             In 1 month Army Melia Jesse Sans, MD [facility], [facility]

## 2021-07-10 ENCOUNTER — Other Ambulatory Visit: Payer: Self-pay

## 2021-07-10 ENCOUNTER — Encounter: Payer: Self-pay | Admitting: Internal Medicine

## 2021-07-10 ENCOUNTER — Ambulatory Visit (INDEPENDENT_AMBULATORY_CARE_PROVIDER_SITE_OTHER): Payer: Medicare Other | Admitting: Internal Medicine

## 2021-07-10 VITALS — BP 118/68 | HR 84 | Temp 98.6°F | Ht 69.0 in | Wt 183.0 lb

## 2021-07-10 DIAGNOSIS — E118 Type 2 diabetes mellitus with unspecified complications: Secondary | ICD-10-CM | POA: Diagnosis not present

## 2021-07-10 DIAGNOSIS — N761 Subacute and chronic vaginitis: Secondary | ICD-10-CM

## 2021-07-10 LAB — POCT WET PREP WITH KOH
KOH Prep POC: POSITIVE — AB
RBC Wet Prep HPF POC: 0
Trichomonas, UA: NEGATIVE
WBC Wet Prep HPF POC: 5

## 2021-07-10 LAB — POCT GLYCOSYLATED HEMOGLOBIN (HGB A1C): Hemoglobin A1C: 8.1 % — AB (ref 4.0–5.6)

## 2021-07-10 MED ORDER — FLUCONAZOLE 100 MG PO TABS: ORAL_TABLET | ORAL | 0 refills | Status: DC

## 2021-07-10 MED ORDER — METRONIDAZOLE 500 MG PO TABS: 500.0000 mg | ORAL_TABLET | Freq: Two times a day (BID) | ORAL | 0 refills | Status: DC

## 2021-07-10 NOTE — Progress Notes (Signed)
Date:  07/10/2021   Name:  Cathy Murphy   DOB:  January 11, 1941   MRN:  [id number]   Chief Complaint: Vaginitis (X1 week, Burning, itching)  Vaginal Discharge The patient's primary symptoms include vaginal discharge. Pertinent negatives include no abdominal pain, dysuria, fever, headaches or hematuria. She has tried nothing for the symptoms. She is not sexually active.  Diabetes She presents for her follow-up diabetic visit. She has type 2 diabetes mellitus. Her disease course has been improving. Pertinent negatives for hypoglycemia include no headaches or tremors. Pertinent negatives for diabetes include no chest pain, no fatigue, no polydipsia and no polyuria. Current diabetic treatments: insulin, januvia, actos, glipizide and cardiology recently added farxiga. Her home blood glucose trend is decreasing steadily. An ACE inhibitor/angiotensin II receptor blocker is being taken.   Lab Results  Component Value Date   CREATININE 1.31 (H) 04/17/2021   BUN 17 04/17/2021   NA 140 04/17/2021   K 4.0 04/17/2021   CL 102 04/17/2021   CO2 23 04/17/2021   Lab Results  Component Value Date   CHOL 153 04/17/2021   HDL 51 04/17/2021   LDLCALC 81 04/17/2021   TRIG 115 04/17/2021   CHOLHDL 3.0 04/17/2021   Lab Results  Component Value Date   TSH 1.520 12/11/2020   Lab Results  Component Value Date   HGBA1C 8.1 (A) 07/10/2021   Lab Results  Component Value Date   WBC 6.9 02/14/2021   HGB 13.7 02/14/2021   HCT 42.8 02/14/2021   MCV 91 02/14/2021   PLT 199 02/14/2021   Lab Results  Component Value Date   ALT 28 02/14/2021   AST 21 02/14/2021   ALKPHOS 94 02/14/2021   BILITOT 1.2 02/14/2021     Review of Systems  Constitutional:  Negative for appetite change, fatigue, fever and unexpected weight change.  HENT:  Negative for tinnitus and trouble swallowing.   Eyes:  Negative for visual disturbance.  Respiratory:  Negative for cough, chest tightness and shortness of breath.    Cardiovascular:  Negative for chest pain, palpitations and leg swelling.  Gastrointestinal:  Negative for abdominal pain.  Endocrine: Negative for polydipsia and polyuria.  Genitourinary:  Positive for vaginal discharge. Negative for dysuria and hematuria.       Vaginal itching  Musculoskeletal:  Negative for arthralgias.  Neurological:  Negative for tremors, numbness and headaches.  Psychiatric/Behavioral:  Negative for dysphoric mood.    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   Ventral hernia without obstruction or gangrene 03/06/2016   Type II diabetes mellitus with complication ([place]) [id number]   Plantar fasciitis, right 06/16/2015   Knee torn cartilage 04/26/2015   Allergic rhinitis, seasonal 04/26/2015   Essential (primary) hypertension 04/26/2015   Glaucoma 04/26/2015   Hyperlipidemia associated with type 2 diabetes mellitus ([place]) 04/26/2015   Insomnia 04/26/2015    Allergies  Allergen Reactions   Amoxicillin Diarrhea   Aricept [Donepezil] Other (See Comments)    Leg cramps   Aspirin Nausea And Vomiting   Metformin And  Related Diarrhea   Oxycodone Other (See Comments)    CNS side effects/agitation   Sulfa Antibiotics     Past Surgical History:  Procedure Laterality Date   ABDOMINAL HYSTERECTOMY     COLONOSCOPY WITH PROPOFOL N/A 11/25/2017   Procedure: COLONOSCOPY WITH PROPOFOL;  Surgeon: [name], MD;  Location: [facility] ENDOSCOPY;  Service: Endoscopy;  Laterality: N/A;    LIPOMA RESECTION     right posterior neck    Social History   Tobacco Use   Smoking status: Former    Packs/day: 1.00    Years: 7.00    Pack years: 7.00    Types: Cigarettes    Quit date: 1965    Years since quitting: 57.7   Smokeless tobacco: Never   Tobacco comments:    smoking cessation materials not required  Vaping Use   Vaping Use: Never used  Substance Use Topics   Alcohol use: No   Drug use: No     Medication list has been reviewed and updated.  Current Meds  Medication Sig   B-D UF III MINI PEN NEEDLES 31G X 5 MM MISC USE AS DIRECTED WITH LEVEMIR   cholecalciferol (VITAMIN D) 1000 units tablet Take 2,000 Units by mouth daily.   dapagliflozin propanediol (FARXIGA) 10 MG TABS tablet Take by mouth daily.   ezetimibe (ZETIA) 10 MG tablet Take 10 mg by mouth daily.   fluconazole (DIFLUCAN) 100 MG tablet One tablet every other day for 3 doses   gabapentin (NEURONTIN) 100 MG capsule Take by mouth.   glipiZIDE (GLUCOTROL) 5 MG tablet TAKE 1 TABLET BY MOUTH TWICE A DAY   glucose blood test strip Use to test blood sugar up to twice daily.   hydrOXYzine (ATARAX/VISTARIL) 25 MG tablet Take 0.5-1 tablets (12.5-25 mg total) by mouth every 8 (eight) hours as needed for anxiety.   insulin detemir (LEVEMIR FLEXTOUCH) 100 UNIT/ML FlexPen Inject 40 Units into the skin daily.   JANUVIA 100 MG tablet TAKE 1 TABLET BY MOUTH EVERY DAY   lisinopril (ZESTRIL) 40 MG tablet TAKE 1 TABLET BY MOUTH EVERY DAY   LUMIGAN 0.01 % SOLN TAKE 1 DROP(S) IN BOTH EYES ONCE IN THE EVENING   meloxicam (MOBIC) 15 MG tablet Take 15 mg by mouth daily.   metroNIDAZOLE (FLAGYL) 500 MG tablet Take 1 tablet (500 mg total) by mouth 2 (two) times daily for 7 days.   pioglitazone (ACTOS) 15 MG tablet TAKE 1 TABLET BY MOUTH EVERY DAY   rosuvastatin (CRESTOR) 40 MG tablet TAKE 1 TABLET BY MOUTH EVERY DAY   SIMBRINZA 1-0.2 % SUSP Apply 1 drop to eye 2 (two) times daily.   spironolactone (ALDACTONE) 25 MG tablet  Take 1 tablet (25 mg total) by mouth daily.   timolol (TIMOPTIC) 0.5 % ophthalmic solution Place 1 drop into both eyes.   traZODone (DESYREL) 50 MG tablet TAKE 1 TABLET BY MOUTH EVERYDAY AT BEDTIME   [DISCONTINUED] dapagliflozin propanediol (FARXIGA) 10 MG TABS tablet Take 1 tablet by mouth daily.    PHQ 2/9 Scores 07/10/2021 04/17/2021 02/14/2021 12/11/2020  PHQ - 2 Score 0 3 6 0  PHQ- 9 Score 0 '7 9 9    '$ GAD 7 : Generalized Anxiety Score 07/10/2021 04/17/2021 02/14/2021 12/11/2020  Nervous, Anxious, on Edge 1 1 0 1  Control/stop worrying 0 0 0 0  Worry too much - different things 1 1 0 0  Trouble relaxing 1 1 0 3  Restless 0 0 0 0  Easily annoyed or  irritable 0 0 0 0  Afraid - awful might happen 1 0 0 0  Total GAD 7 Score 4 3 0 4  Anxiety Difficulty - Not difficult at all - Not difficult at all    BP Readings from Last 3 Encounters:  07/10/21 118/68  04/17/21 106/68  02/14/21 112/60    Physical Exam Vitals and nursing note reviewed.  Constitutional:      General: She is not in acute distress.    Appearance: She is well-developed.  HENT:     Head: Normocephalic and atraumatic.  Pulmonary:     Effort: Pulmonary effort is normal. No respiratory distress.  Skin:    General: Skin is warm and dry.     Findings: No rash.  Neurological:     Mental Status: She is alert and oriented to person, place, and time.  Psychiatric:        Mood and Affect: Mood normal.        Behavior: Behavior normal.    Wt Readings from Last 3 Encounters:  07/10/21 183 lb (83 kg)  04/17/21 184 lb (83.5 kg)  02/14/21 185 lb (83.9 kg)    BP 118/68   Pulse 84   Temp 98.6 F (37 C) (Oral)   Ht '5\' 9"'$  (1.753 m)   Wt 183 lb (83 kg)   SpO2 98%   BMI 27.02 kg/m   Assessment and Plan: 1. Subacute vaginitis Wet prep shows yeast and clue cells  - POCT Wet Prep with KOH - fluconazole (DIFLUCAN) 100 MG tablet; One tablet every other day for 3 doses  Dispense: 3 tablet; Refill: 0 - metroNIDAZOLE (FLAGYL)  500 MG tablet; Take 1 tablet (500 mg total) by mouth 2 (two) times daily for 7 days.  Dispense: 14 tablet; Refill: 0  2. Type II diabetes mellitus with complication (HCC) Much improved with addition of farxiga. Continue current regimen for now. More extensive labs next visit - POCT glycosylated hemoglobin (Hb A1C) = 8.1   Partially dictated using Editor, commissioning. Any errors are unintentional.  Halina Maidens, MD [facility]  07/10/2021

## 2021-07-16 ENCOUNTER — Other Ambulatory Visit: Payer: Self-pay

## 2021-07-16 ENCOUNTER — Ambulatory Visit (INDEPENDENT_AMBULATORY_CARE_PROVIDER_SITE_OTHER): Payer: Medicare Other

## 2021-07-16 VITALS — BP 122/72 | HR 80 | Temp 98.6°F | Resp 16 | Ht 69.0 in | Wt 183.0 lb

## 2021-07-16 DIAGNOSIS — Z1231 Encounter for screening mammogram for malignant neoplasm of breast: Secondary | ICD-10-CM | POA: Diagnosis not present

## 2021-07-16 DIAGNOSIS — Z0000 Encounter for general adult medical examination without abnormal findings: Secondary | ICD-10-CM | POA: Diagnosis not present

## 2021-07-16 NOTE — Progress Notes (Signed)
Subjective:   Cathy Murphy is a 80 y.o. female who presents for Medicare Annual (Subsequent) preventive examination.  Review of Systems     Cardiac Risk Factors include: advanced age (>1mn, >>88women);diabetes mellitus;dyslipidemia;hypertension     Objective:    Today's Vitals   07/16/21 1018  BP: 122/72  Pulse: 80  Resp: 16  Temp: 98.6 F (37 C)  TempSrc: Oral  SpO2: 98%  Weight: 183 lb (83 kg)  Height: '5\' 9"'$  (1.753 m)   Body mass index is 27.02 kg/m.  Advanced Directives 07/16/2021 07/12/2020 12/30/2019 07/07/2019 05/25/2018 04/15/2018 03/05/2018  Does Patient Have a Medical Advance Directive? No No No No No No Yes  Type of Advance Directive - - - - - - HPress photographerLiving will  Does patient want to make changes to medical advance directive? - - - - - Yes (MAU/Ambulatory/Procedural Areas - Information given) No - Patient declined  Copy of [place]? - - - - - - No - copy requested  Would patient like information on creating a medical advance directive? No - Patient declined No - Patient declined - Yes (MAU/Ambulatory/Procedural Areas - Information given) - - -    Current Medications (verified) Outpatient Encounter Medications as of 07/16/2021  Medication Sig   B-D UF III MINI PEN NEEDLES 31G X 5 MM MISC USE AS DIRECTED WITH LEVEMIR   cholecalciferol (VITAMIN D) 1000 units tablet Take 2,000 Units by mouth daily.   dapagliflozin propanediol (FARXIGA) 10 MG TABS tablet Take by mouth daily.   ezetimibe (ZETIA) 10 MG tablet Take 10 mg by mouth daily.   gabapentin (NEURONTIN) 100 MG capsule Take by mouth.   glipiZIDE (GLUCOTROL) 5 MG tablet TAKE 1 TABLET BY MOUTH TWICE A DAY   glucose blood test strip Use to test blood sugar up to twice daily.   hydrOXYzine (ATARAX/VISTARIL) 25 MG tablet Take 0.5-1 tablets (12.5-25 mg total) by mouth every 8 (eight) hours as needed for anxiety.   insulin detemir (LEVEMIR FLEXTOUCH) 100 UNIT/ML FlexPen  Inject 40 Units into the skin daily.   JANUVIA 100 MG tablet TAKE 1 TABLET BY MOUTH EVERY DAY   lisinopril (ZESTRIL) 40 MG tablet TAKE 1 TABLET BY MOUTH EVERY DAY   LUMIGAN 0.01 % SOLN TAKE 1 DROP(S) IN BOTH EYES ONCE IN THE EVENING   pioglitazone (ACTOS) 15 MG tablet TAKE 1 TABLET BY MOUTH EVERY DAY   rosuvastatin (CRESTOR) 40 MG tablet TAKE 1 TABLET BY MOUTH EVERY DAY   SIMBRINZA 1-0.2 % SUSP Apply 1 drop to eye 2 (two) times daily.   spironolactone (ALDACTONE) 25 MG tablet Take 1 tablet (25 mg total) by mouth daily.   traZODone (DESYREL) 50 MG tablet TAKE 1 TABLET BY MOUTH EVERYDAY AT BEDTIME   [DISCONTINUED] albuterol (PROAIR HFA) 108 (90 Base) MCG/ACT inhaler Inhale 2 puffs into the lungs every 6 (six) hours as needed for wheezing or shortness of breath.   [DISCONTINUED] fluconazole (DIFLUCAN) 100 MG tablet One tablet every other day for 3 doses   [DISCONTINUED] meloxicam (MOBIC) 15 MG tablet Take 15 mg by mouth daily.   [DISCONTINUED] metroNIDAZOLE (FLAGYL) 500 MG tablet Take 1 tablet (500 mg total) by mouth 2 (two) times daily for 7 days.   [DISCONTINUED] timolol (TIMOPTIC) 0.5 % ophthalmic solution Place 1 drop into both eyes.   No facility-administered encounter medications on file as of 07/16/2021.    Allergies (verified) Amoxicillin, Aricept [donepezil], Aspirin, Metformin and related, Oxycodone, and  Sulfa antibiotics   History: Past Medical History:  Diagnosis Date   Allergy    Anxiety    Diabetes mellitus without complication ([place])    Glaucoma    Hyperlipidemia    Hypertension    Past Surgical History:  Procedure Laterality Date   ABDOMINAL HYSTERECTOMY     COLONOSCOPY WITH PROPOFOL N/A 11/25/2017   Procedure: COLONOSCOPY WITH PROPOFOL;  Surgeon: [name], MD;  Location[facility] ENDOSCOPY;  Service: Endoscopy;  Laterality: N/A;   LIPOMA RESECTION     right posterior neck   Family History  Problem Relation Age of Onset   Cancer Mother        Breast   Breast  cancer Mother    Heart disease Father    Heart attack Brother    Cancer Maternal Aunt        breast   Breast cancer Maternal Aunt    Breast cancer Cousin        mat cousin   Social History   Socioeconomic History   Marital status: Widowed    Spouse name: Not on file   Number of children: 1   Years of education: Not on file   Highest education level: 12th grade  Occupational History    Employer: [employer]  Tobacco Use   Smoking status: Former    Packs/day: 1.00    Years: 7.00    Pack years: 7.00    Types: Cigarettes    Quit date: 1965    Years since quitting: 57.7   Smokeless tobacco: Never   Tobacco comments:    smoking cessation materials not required  Vaping Use   Vaping Use: Never used  Substance and Sexual Activity   Alcohol use: No   Drug use: No   Sexual activity: Never  Other Topics Concern   Not on file  Social History Narrative   Pt lives alone.   Social Determinants of Health   Financial Resource Strain: Low Risk    Difficulty of Paying Living Expenses: Not hard at all  Food Insecurity: No Food Insecurity   Worried About Charity fundraiser in the Last Year: Never true   [place] in the Last Year: Never true  Transportation Needs: No Transportation Needs   Lack of Transportation (Medical): No   Lack of Transportation (Non-Medical): No  Physical Activity: Inactive   Days of Exercise per Week: 0 days   Minutes of Exercise per Session: 0 min  Stress: Stress Concern Present   Feeling of Stress : To some extent  Social Connections: Moderately Isolated   Frequency of Communication with Friends and Family: Not on file   Frequency of Social Gatherings with Friends and Family: Three times a week   Attends Religious Services: More than 4 times per year   Active Member of Clubs or Organizations: No   Attends Archivist Meetings: Never   Marital Status: Widowed    Tobacco Counseling Counseling given: Not Answered Tobacco comments: smoking  cessation materials not required   Clinical Intake:  Pre-visit preparation completed: Yes  Pain : No/denies pain     BMI - recorded: 27.02 Nutritional Status: BMI > 30  Obese Nutritional Risks: None Diabetes: Yes CBG done?: No Did pt. bring in CBG monitor from home?: No  How often do you need to have someone help you when you read instructions, pamphlets, or other written materials from your doctor or pharmacy?: 1 - Never  Nutrition Risk Assessment:  Has the  patient had any N/V/D within the last 2 months?  No  Does the patient have any non-healing wounds?  No  Has the patient had any unintentional weight loss or weight gain?  No   Diabetes:  Is the patient diabetic?  Yes  If diabetic, was a CBG obtained today?  No  Did the patient bring in their glucometer from home?  No  How often do you monitor your CBG's? 2-3 times per week.   Financial Strains and Diabetes Management:  Are you having any financial strains with the device, your supplies or your medication? No .  Does the patient want to be seen by Chronic Care Management for management of their diabetes?  No  Would the patient like to be referred to a Nutritionist or for Diabetic Management?  No   Diabetic Exams:  Diabetic Eye Exam: Completed 12/22/20 negative retinopathy.   Diabetic Foot Exam: Completed 04/17/21.   Interpreter Needed?: No  Information entered by :: Clemetine Marker LPN   Activities of Daily Living In your present state of health, do you have any difficulty performing the following activities: 07/16/2021 07/10/2021  Hearing? N N  Vision? N N  Difficulty concentrating or making decisions? Y N  Walking or climbing stairs? N N  Dressing or bathing? N N  Doing errands, shopping? N N  Preparing Food and eating ? N -  Using the Toilet? N -  In the past six months, have you accidently leaked urine? N -  Do you have problems with loss of bowel control? N -  Managing your Medications? N -  Managing your  Finances? N -  Housekeeping or managing your Housekeeping? N -  Some recent data might be hidden    Patient Care Team: Glean Hess, MD as PCP - General (Internal Medicine) Leon (Ophthalmology) Corey Skains, MD as Consulting Physician (Cardiology)  Indicate any recent Medical Services you may have received from other than [facility] providers in the past year (date may be approximate).     Assessment:   This is a routine wellness examination for [place].  Hearing/Vision screen Hearing Screening - Comments:: Pt denies hearing difficulty Vision Screening - Comments:: [facility] screen[facility]  Dietary issues and exercise activities discussed: Current Exercise Habits: The patient does not participate in regular exercise at present, Exercise limited by: neurologic condition(s)   Goals Addressed             This Visit's Progress    DIET - INCREASE WATER INTAKE   On track    Recommend to drink at least 6-8 8oz glasses of water per day.       Depression Screen PHQ 2/9 Scores 07/16/2021 07/10/2021 04/17/2021 02/14/2021 12/11/2020 08/16/2020 07/12/2020  PHQ - 2 Score 1 0 3 6 0 4 0  PHQ- 9 Score 4 0 '7 9 9 8 '$ -    Fall Risk Fall Risk  07/16/2021 07/10/2021 04/17/2021 02/14/2021 12/11/2020  Falls in the past year? 0 0 0 0 0  Number falls in past yr: 0 0 0 - 0  Injury with Fall? 0 0 0 - -  Risk for fall due to : No Fall Risks No Fall Risks - - -  Risk for fall due to: Comment - - - - -  Follow up Falls prevention discussed Falls evaluation completed Falls evaluation completed Falls evaluation completed Falls evaluation completed    [place]:  Any stairs in  or around the home? Yes  If so, are there any without handrails? No  Home free of loose throw rugs in walkways, pet beds, electrical cords, etc? Yes  Adequate lighting in your home to reduce risk of falls? Yes   ASSISTIVE DEVICES UTILIZED TO PREVENT FALLS:  Life  alert? No  Use of a cane, walker or w/c? No  Grab bars in the bathroom? No  Shower chair or bench in shower? No  Elevated toilet seat or a handicapped toilet? Yes   TIMED UP AND GO:  Was the test performed? Yes .  Length of time to ambulate 10 feet: 5 sec.   Gait steady and fast without use of assistive device  Cognitive Function: Cognitive status assessed by direct observation. Patient has current diagnosis of cognitive impairment. Patient seen by neurology 12/2020; did not tolerate aricept and does not plan to go back to Dr. Melrose Nakayama. Pt states memory improved.      6CIT Screen 12/11/2020 07/12/2020 07/07/2019 04/15/2018 04/07/2017  What Year? 0 points 0 points 0 points 0 points 0 points  What month? 0 points 0 points 0 points 0 points 0 points  What time? 0 points 0 points 0 points 0 points 0 points  Count back from 20 4 points 0 points 0 points 2 points 0 points  Months in reverse 2 points 0 points 0 points 2 points 0 points  Repeat phrase 4 points 0 points 0 points 2 points 0 points  Total Score 10 0 0 6 0    Immunizations Immunization History  Administered Date(s) Administered   Influenza-Unspecified 03/06/2016   Moderna Sars-Covid-2 Vaccination 01/01/2020, 01/29/2020, 10/31/2020   Td 03/05/2011    TDAP status: Due, Education has been provided regarding the importance of this vaccine. Advised may receive this vaccine at local pharmacy or Health Dept. Aware to provide a copy of the vaccination record if obtained from local pharmacy or Health Dept. Verbalized acceptance and understanding.  Flu Vaccine status: Declined, Education has been provided regarding the importance of this vaccine but patient still declined. Advised may receive this vaccine at local pharmacy or Health Dept. Aware to provide a copy of the vaccination record if obtained from local pharmacy or Health Dept. Verbalized acceptance and understanding.  Pneumococcal vaccine status: Declined,  Education has been provided  regarding the importance of this vaccine but patient still declined. Advised may receive this vaccine at local pharmacy or Health Dept. Aware to provide a copy of the vaccination record if obtained from local pharmacy or Health Dept. Verbalized acceptance and understanding.   Covid-19 vaccine status: Completed vaccines  Qualifies for Shingles Vaccine? Yes   Zostavax completed No   Shingrix Completed?: No.    Education has been provided regarding the importance of this vaccine. Patient has been advised to call insurance company to determine out of pocket expense if they have not yet received this vaccine. Advised may also receive vaccine at local pharmacy or Health Dept. Verbalized acceptance and understanding.  Screening Tests Health Maintenance  Topic Date Due   Zoster Vaccines- Shingrix (1 of 2) Never done   COVID-19 Vaccine (4 - Booster for Moderna series) 03/01/2021   TETANUS/TDAP  03/04/2021   INFLUENZA VACCINE  02/01/2022 (Originally 06/04/2021)   PNA vac Low Risk Adult (1 of 2 - PCV13) 02/14/2022 (Originally 10/28/2008)   MAMMOGRAM  08/22/2021   OPHTHALMOLOGY EXAM  12/22/2021   HEMOGLOBIN A1C  01/07/2022   FOOT EXAM  04/17/2022   DEXA SCAN  Completed  Hepatitis C Screening  Completed   HPV VACCINES  Aged Out    Health Maintenance  Health Maintenance Due  Topic Date Due   Zoster Vaccines- Shingrix (1 of 2) Never done   COVID-19 Vaccine (4 - Booster for Moderna series) 03/01/2021   TETANUS/TDAP  03/04/2021    Colorectal cancer screening: No longer required.   Mammogram status: Completed 08/22/20. Repeat every year. Ordered today  Bone Density status: Completed 08/04/19. Results reflect: Bone density results: OSTEOPENIA. Repeat every 2 years. Pt declined repeat screening at this time.   Lung Cancer Screening: (Low Dose CT Chest recommended if Age 61-80 years, 30 pack-year currently smoking OR have quit w/in 15years.) does not qualify.   Additional Screening:  Hepatitis C  Screening: does qualify; Completed 05/16/20  Vision Screening: Recommended annual ophthalmology exams for early detection of glaucoma and other disorders of the eye. Is the patient up to date with their annual eye exam?  Yes  Who is the provider or what is the name of the office in which the patient attends annual eye exams? [facility].   Dental Screening: Recommended annual dental exams for proper oral hygiene  Community Resource Referral / Chronic Care Management: CRR required this visit?  No   CCM required this visit?  No      Plan:     I have personally reviewed and noted the following in the patient's chart:   Medical and social history Use of alcohol, tobacco or illicit drugs  Current medications and supplements including opioid prescriptions.  Functional ability and status Nutritional status Physical activity Advanced directives List of other physicians Hospitalizations, surgeries, and ER visits in previous 12 months Vitals Screenings to include cognitive, depression, and falls Referrals and appointments  In addition, I have reviewed and discussed with patient certain preventive protocols, quality metrics, and best practice recommendations. A written personalized care plan for preventive services as well as general preventive health recommendations were provided to patient.     Clemetine Marker, LPN   [id number]   Nurse Notes: pt reports decrease in fasting am blood sugars; has woken up a couple of times a week feeling jittery and blood sugar has been 70s-80s. Pt also notes an increase in urinary frequency but no incontinence or irritation; drinks a lot of gold peak tea. Pt scheduled for CPE 08/29/21.

## 2021-07-16 NOTE — Patient Instructions (Signed)
[name] , Thank you for taking time to come for your Medicare Wellness Visit. I appreciate your ongoing commitment to your health goals. Please review the following plan we discussed and let me know if I can assist you in the future.   Screening recommendations/referrals: Colonoscopy: no longer required Mammogram: done 08/22/20. Please call [phone] to schedule your mammogram.  Bone Density: done 08/04/19 Recommended yearly ophthalmology/optometry visit for glaucoma screening and checkup Recommended yearly dental visit for hygiene and checkup  Vaccinations: Influenza vaccine: declined Pneumococcal vaccine: declined Tdap vaccine: due Shingles vaccine: Shingrix discussed. Please contact your pharmacy for coverage information.  Covid-19:done 01/01/20, 01/29/20 & 10/31/20  Advanced directives: Advance directive discussed with you today. Even though you declined this today please call our office should you change your mind and we can give you the proper paperwork for you to fill out.   Conditions/risks identified: Recommend increasing physical activity to at least 3 days per week   Next appointment: Follow up in one year for your annual wellness visit    Preventive Care 65 Years and Older, Female Preventive care refers to lifestyle choices and visits with your health care provider that can promote health and wellness. What does preventive care include? A yearly physical exam. This is also called an annual well check. Dental exams once or twice a year. Routine eye exams. Ask your health care provider how often you should have your eyes checked. Personal lifestyle choices, including: Daily care of your teeth and gums. Regular physical activity. Eating a healthy diet. Avoiding tobacco and drug use. Limiting alcohol use. Practicing safe sex. Taking low-dose aspirin every day. Taking vitamin and mineral supplements as recommended by your health care provider. What happens during an  annual well check? The services and screenings done by your health care provider during your annual well check will depend on your age, overall health, lifestyle risk factors, and family history of disease. Counseling  Your health care provider may ask you questions about your: Alcohol use. Tobacco use. Drug use. Emotional well-being. Home and relationship well-being. Sexual activity. Eating habits. History of falls. Memory and ability to understand (cognition). Work and work Statistician. Reproductive health. Screening  You may have the following tests or measurements: Height, weight, and BMI. Blood pressure. Lipid and cholesterol levels. These may be checked every 5 years, or more frequently if you are over 39 years old. Skin check. Lung cancer screening. You may have this screening every year starting at age 25 if you have a 30-pack-year history of smoking and currently smoke or have quit within the past 15 years. Fecal occult blood test (FOBT) of the stool. You may have this test every year starting at age 68. Flexible sigmoidoscopy or colonoscopy. You may have a sigmoidoscopy every 5 years or a colonoscopy every 10 years starting at age 35. Hepatitis C blood test. Hepatitis B blood test. Sexually transmitted disease (STD) testing. Diabetes screening. This is done by checking your blood sugar (glucose) after you have not eaten for a while (fasting). You may have this done every 1-3 years. Bone density scan. This is done to screen for osteoporosis. You may have this done starting at age 48. Mammogram. This may be done every 1-2 years. Talk to your health care provider about how often you should have regular mammograms. Talk with your health care provider about your test results, treatment options, and if necessary, the need for more tests. Vaccines  Your health care provider may recommend certain vaccines, such as:  Influenza vaccine. This is recommended every year. Tetanus,  diphtheria, and acellular pertussis (Tdap, Td) vaccine. You may need a Td booster every 10 years. Zoster vaccine. You may need this after age 81. Pneumococcal 13-valent conjugate (PCV13) vaccine. One dose is recommended after age 57. Pneumococcal polysaccharide (PPSV23) vaccine. One dose is recommended after age 65. Talk to your health care provider about which screenings and vaccines you need and how often you need them. This information is not intended to replace advice given to you by your health care provider. Make sure you discuss any questions you have with your health care provider. Document Released: 11/17/2015 Document Revised: 07/10/2016 Document Reviewed: 08/22/2015 Elsevier Interactive Patient Education  2017 [place] Prevention in the Home Falls can cause injuries. They can happen to people of all ages. There are many things you can do to make your home safe and to help prevent falls. What can I do on the outside of my home? Regularly fix the edges of walkways and driveways and fix any cracks. Remove anything that might make you trip as you walk through a door, such as a raised step or threshold. Trim any bushes or trees on the path to your home. Use bright outdoor lighting. Clear any walking paths of anything that might make someone trip, such as rocks or tools. Regularly check to see if handrails are loose or broken. Make sure that both sides of any steps have handrails. Any raised decks and porches should have guardrails on the edges. Have any leaves, snow, or ice cleared regularly. Use sand or salt on walking paths during winter. Clean up any spills in your garage right away. This includes oil or grease spills. What can I do in the bathroom? Use night lights. Install grab bars by the toilet and in the tub and shower. Do not use towel bars as grab bars. Use non-skid mats or decals in the tub or shower. If you need to sit down in the shower, use a plastic,  non-slip stool. Keep the floor dry. Clean up any water that spills on the floor as soon as it happens. Remove soap buildup in the tub or shower regularly. Attach bath mats securely with double-sided non-slip rug tape. Do not have throw rugs and other things on the floor that can make you trip. What can I do in the bedroom? Use night lights. Make sure that you have a light by your bed that is easy to reach. Do not use any sheets or blankets that are too big for your bed. They should not hang down onto the floor. Have a firm chair that has side arms. You can use this for support while you get dressed. Do not have throw rugs and other things on the floor that can make you trip. What can I do in the kitchen? Clean up any spills right away. Avoid walking on wet floors. Keep items that you use a lot in easy-to-reach places. If you need to reach something above you, use a strong step stool that has a grab bar. Keep electrical cords out of the way. Do not use floor polish or wax that makes floors slippery. If you must use wax, use non-skid floor wax. Do not have throw rugs and other things on the floor that can make you trip. What can I do with my stairs? Do not leave any items on the stairs. Make sure that there are handrails on both sides of the stairs and use them.  Fix handrails that are broken or loose. Make sure that handrails are as long as the stairways. Check any carpeting to make sure that it is firmly attached to the stairs. Fix any carpet that is loose or worn. Avoid having throw rugs at the top or bottom of the stairs. If you do have throw rugs, attach them to the floor with carpet tape. Make sure that you have a light switch at the top of the stairs and the bottom of the stairs. If you do not have them, ask someone to add them for you. What else can I do to help prevent falls? Wear shoes that: Do not have high heels. Have rubber bottoms. Are comfortable and fit you well. Are closed  at the toe. Do not wear sandals. If you use a stepladder: Make sure that it is fully opened. Do not climb a closed stepladder. Make sure that both sides of the stepladder are locked into place. Ask someone to hold it for you, if possible. Clearly mark and make sure that you can see: Any grab bars or handrails. First and last steps. Where the edge of each step is. Use tools that help you move around (mobility aids) if they are needed. These include: Canes. Walkers. Scooters. Crutches. Turn on the lights when you go into a dark area. Replace any light bulbs as soon as they burn out. Set up your furniture so you have a clear path. Avoid moving your furniture around. If any of your floors are uneven, fix them. If there are any pets around you, be aware of where they are. Review your medicines with your doctor. Some medicines can make you feel dizzy. This can increase your chance of falling. Ask your doctor what other things that you can do to help prevent falls. This information is not intended to replace advice given to you by your health care provider. Make sure you discuss any questions you have with your health care provider. Document Released: 08/17/2009 Document Revised: 03/28/2016 Document Reviewed: 11/25/2014 Elsevier Interactive Patient Education  2017 [employer].

## 2021-07-19 ENCOUNTER — Other Ambulatory Visit: Payer: Self-pay | Admitting: Internal Medicine

## 2021-07-19 DIAGNOSIS — E118 Type 2 diabetes mellitus with unspecified complications: Secondary | ICD-10-CM

## 2021-07-19 NOTE — Telephone Encounter (Signed)
Future visit in 1 month  

## 2021-08-01 ENCOUNTER — Other Ambulatory Visit: Payer: Self-pay

## 2021-08-01 ENCOUNTER — Telehealth: Payer: Self-pay

## 2021-08-01 DIAGNOSIS — N761 Subacute and chronic vaginitis: Secondary | ICD-10-CM

## 2021-08-01 MED ORDER — FLUCONAZOLE 100 MG PO TABS: ORAL_TABLET | ORAL | 0 refills | Status: DC

## 2021-08-01 NOTE — Telephone Encounter (Signed)
Patient said she will not take anymore of the medicine Wilder Glade but it causes her to much discomfort vaginally. She has another yeast infection. Will come in Monday to discuss changing medications. Last A1C was POC4 and it was 8.1.

## 2021-08-01 NOTE — Telephone Encounter (Signed)
Copied from Angie [id number]. Topic: General - Inquiry >> Aug 01, 2021  8:46 AM Loma Boston wrote: dapagliflozin propanediol (FARXIGA) 10 MG TABS tablet      Sig - Route: Take by mouth daily. - Oral   Pt has called wanting to talk to Dr Sharmaine [employer] nurse, states her dr at [facility] has taken her off this med and she needs something else, wants FU call from nurse asap as is going out of town and needs medication to replace. Pls fu at [phone]

## 2021-08-06 ENCOUNTER — Ambulatory Visit: Payer: Medicare Other | Admitting: Internal Medicine

## 2021-08-10 DIAGNOSIS — H401132 Primary open-angle glaucoma, bilateral, moderate stage: Secondary | ICD-10-CM | POA: Diagnosis not present

## 2021-08-20 ENCOUNTER — Other Ambulatory Visit: Payer: Self-pay | Admitting: Internal Medicine

## 2021-08-29 ENCOUNTER — Other Ambulatory Visit: Payer: Self-pay

## 2021-08-29 ENCOUNTER — Ambulatory Visit (INDEPENDENT_AMBULATORY_CARE_PROVIDER_SITE_OTHER): Payer: Medicare Other | Admitting: Internal Medicine

## 2021-08-29 ENCOUNTER — Encounter: Payer: Self-pay | Admitting: Internal Medicine

## 2021-08-29 VITALS — BP 124/78 | HR 76 | Temp 98.6°F | Ht 69.0 in | Wt 183.6 lb

## 2021-08-29 DIAGNOSIS — E1169 Type 2 diabetes mellitus with other specified complication: Secondary | ICD-10-CM

## 2021-08-29 DIAGNOSIS — M23206 Derangement of unspecified meniscus due to old tear or injury, right knee: Secondary | ICD-10-CM

## 2021-08-29 DIAGNOSIS — Z0000 Encounter for general adult medical examination without abnormal findings: Secondary | ICD-10-CM

## 2021-08-29 DIAGNOSIS — R0981 Nasal congestion: Secondary | ICD-10-CM

## 2021-08-29 DIAGNOSIS — Z1231 Encounter for screening mammogram for malignant neoplasm of breast: Secondary | ICD-10-CM | POA: Diagnosis not present

## 2021-08-29 DIAGNOSIS — E785 Hyperlipidemia, unspecified: Secondary | ICD-10-CM

## 2021-08-29 DIAGNOSIS — I10 Essential (primary) hypertension: Secondary | ICD-10-CM | POA: Diagnosis not present

## 2021-08-29 DIAGNOSIS — E118 Type 2 diabetes mellitus with unspecified complications: Secondary | ICD-10-CM

## 2021-08-29 DIAGNOSIS — N1831 Chronic kidney disease, stage 3a: Secondary | ICD-10-CM

## 2021-08-29 LAB — POCT URINALYSIS DIPSTICK
Bilirubin, UA: NEGATIVE
Blood, UA: NEGATIVE
Glucose, UA: NEGATIVE
Ketones, UA: NEGATIVE
Leukocytes, UA: NEGATIVE
Nitrite, UA: NEGATIVE
Protein, UA: POSITIVE — AB
Spec Grav, UA: >=1.03 — AB (ref 1.010–1.025)
Urobilinogen, UA: 0.2 E.U./dL
pH, UA: 6 (ref 5.0–8.0)

## 2021-08-29 NOTE — Progress Notes (Signed)
Date:  08/29/2021   Name:  REGNIA MATHWIG   DOB:  [birth year]   MRN:  [id number]   Chief Complaint: Annual Exam, Sinusitis, and Hypertension Cathy Murphy is a 80 y.o. female who presents today for her Complete Annual Exam. She feels well. She reports exercising , and walking. She reports she is sleeping fairly well. Breast complaints - none.  Mammogram: 08/2020 - ordered DEXA: 07/2019 osteopenia Pap smear: discontinued Colonoscopy: 11/2017  Immunization History  Administered Date(s) Administered   Influenza-Unspecified 03/06/2016   Moderna Sars-Covid-2 Vaccination 01/01/2020, 01/29/2020, 10/31/2020   Td 03/05/2011    Hypertension This is a chronic problem. The problem is controlled. Associated symptoms include headaches. Pertinent negatives include no chest pain, palpitations or shortness of breath. Past treatments include diuretics and ACE inhibitors. The current treatment provides significant improvement.  Diabetes She presents for her follow-up diabetic visit. She has type 2 diabetes mellitus. Her disease course has been stable. Hypoglycemia symptoms include headaches. Pertinent negatives for hypoglycemia include no dizziness, nervousness/anxiousness or tremors. Pertinent negatives for diabetes include no chest pain, no fatigue, no polydipsia and no polyuria. Current diabetic treatments: glipizide, januvia, insulin - stopped farxiga due to yeast infections. She is compliant with treatment all of the time. Her home blood glucose trend is decreasing steadily. An ACE inhibitor/angiotensin II receptor blocker is being taken. Eye exam is current.  Hyperlipidemia This is a chronic problem. The problem is controlled. Pertinent negatives include no chest pain or shortness of breath. Current antihyperlipidemic treatment includes statins. The current treatment provides significant improvement of lipids.  Sinus Problem This is a new problem. The current episode started yesterday. The problem has  been gradually worsening since onset. There has been no fever. The pain is mild. Associated symptoms include headaches, sinus pressure and sneezing. Pertinent negatives include no chills, congestion, coughing or shortness of breath.  Shoulder Pain  The pain is present in the right shoulder. This is a chronic problem. The quality of the pain is described as aching and sharp. The pain is at a severity of 5/10. The pain is moderate. Associated symptoms include an inability to bear weight and a limited range of motion. Pertinent negatives include no fever. The symptoms are aggravated by activity (has to push herself up from sitting due to knee pain/OA).  Knee Pain  The pain is present in the left knee and right knee. The quality of the pain is described as aching and shooting. The pain is moderate. Associated symptoms include an inability to bear weight. The symptoms are aggravated by weight bearing. Treatments tried: injections several years ago. The treatment provided mild relief.  Back Pain This is a chronic problem. Associated symptoms include headaches. Pertinent negatives include no abdominal pain, chest pain, dysuria or fever. Treatments tried: on gabapentin from pain management.   Lab Results  Component Value Date   CREATININE 1.31 (H) 04/17/2021   BUN 17 04/17/2021   NA 140 04/17/2021   K 4.0 04/17/2021   CL 102 04/17/2021   CO2 23 04/17/2021   Lab Results  Component Value Date   CHOL 153 04/17/2021   HDL 51 04/17/2021   LDLCALC 81 04/17/2021   TRIG 115 04/17/2021   CHOLHDL 3.0 04/17/2021   Lab Results  Component Value Date   TSH 1.520 12/11/2020   Lab Results  Component Value Date   HGBA1C 8.1 (A) 07/10/2021   Lab Results  Component Value Date   WBC 6.9 02/14/2021   HGB 13.7 02/14/2021  HCT 42.8 02/14/2021   MCV 91 02/14/2021   PLT 199 02/14/2021   Lab Results  Component Value Date   ALT 28 02/14/2021   AST 21 02/14/2021   ALKPHOS 94 02/14/2021   BILITOT 1.2  02/14/2021     Review of Systems  Constitutional:  Negative for chills, fatigue and fever.  HENT:  Positive for sinus pressure and sneezing. Negative for congestion, hearing loss, tinnitus, trouble swallowing and voice change.   Eyes:  Negative for visual disturbance.  Respiratory:  Negative for cough, chest tightness, shortness of breath and wheezing.   Cardiovascular:  Negative for chest pain, palpitations and leg swelling.  Gastrointestinal:  Negative for abdominal pain, constipation, diarrhea and vomiting.  Endocrine: Negative for polydipsia and polyuria.  Genitourinary:  Negative for dysuria, frequency, genital sores, vaginal bleeding and vaginal discharge.  Musculoskeletal:  Positive for arthralgias (right shoulder), back pain and gait problem (bilateral knee OA). Negative for joint swelling.  Skin:  Negative for color change and rash.  Neurological:  Positive for headaches. Negative for dizziness, tremors and light-headedness.  Hematological:  Negative for adenopathy. Does not bruise/bleed easily.  Psychiatric/Behavioral:  Negative for dysphoric mood and sleep disturbance. The patient is not nervous/anxious.    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   Ventral hernia without obstruction or gangrene 03/06/2016   Type II diabetes mellitus with complication ([place]) 94/50/3888   Plantar fasciitis, right 06/16/2015   Knee torn cartilage 04/26/2015   Allergic rhinitis, seasonal 04/26/2015    Essential (primary) hypertension 04/26/2015   Glaucoma 04/26/2015   Hyperlipidemia associated with type 2 diabetes mellitus ([place]) 04/26/2015   Insomnia 04/26/2015    Allergies  Allergen Reactions   Amoxicillin Diarrhea   Aricept [Donepezil] Other (See Comments)    Leg cramps   Aspirin Nausea And Vomiting   Metformin And Related Diarrhea   Oxycodone Other (See Comments)    CNS side effects/agitation   Sulfa Antibiotics     Past Surgical History:  Procedure Laterality Date   ABDOMINAL HYSTERECTOMY     COLONOSCOPY WITH PROPOFOL N/A 11/25/2017   Procedure: COLONOSCOPY WITH PROPOFOL;  Surgeon: [name], MD;  Location: [facility] ENDOSCOPY;  Service: Endoscopy;  Laterality: N/A;   LIPOMA RESECTION     right posterior neck    Social History   Tobacco Use   Smoking status: Former    Packs/day: 1.00    Years: 7.00    Pack years: 7.00    Types: Cigarettes    Quit date: 1965    Years since quitting: 57.8   Smokeless tobacco: Never   Tobacco comments:    smoking cessation materials not required  Vaping Use   Vaping Use: Never used  Substance Use Topics   Alcohol use: No   Drug use: No     Medication list has been reviewed and updated.  Current Meds  Medication Sig   B-D UF III MINI PEN NEEDLES 31G X 5 MM MISC USE AS DIRECTED WITH LEVEMIR   cholecalciferol (VITAMIN D) 1000 units tablet  Take 2,000 Units by mouth daily.   dapagliflozin propanediol (FARXIGA) 10 MG TABS tablet Take by mouth daily.   ezetimibe (ZETIA) 10 MG tablet Take 10 mg by mouth daily.   gabapentin (NEURONTIN) 100 MG capsule Take by mouth.   glipiZIDE (GLUCOTROL) 5 MG tablet TAKE 1 TABLET BY MOUTH TWICE A DAY   glucose blood test strip Use to test blood sugar up to twice daily.   hydrOXYzine (ATARAX/VISTARIL) 25 MG tablet Take 0.5-1 tablets (12.5-25 mg total) by mouth every 8 (eight) hours as needed for anxiety.   JANUVIA 100 MG tablet TAKE 1 TABLET BY MOUTH EVERY DAY   LEVEMIR FLEXTOUCH 100 UNIT/ML  FlexTouch Pen INJECT 30 UNITS INTO THE SKIN DAILY.   lisinopril (ZESTRIL) 40 MG tablet TAKE 1 TABLET BY MOUTH EVERY DAY   LUMIGAN 0.01 % SOLN TAKE 1 DROP(S) IN BOTH EYES ONCE IN THE EVENING   pioglitazone (ACTOS) 15 MG tablet TAKE 1 TABLET BY MOUTH EVERY DAY   rosuvastatin (CRESTOR) 40 MG tablet TAKE 1 TABLET BY MOUTH EVERY DAY   SIMBRINZA 1-0.2 % SUSP Apply 1 drop to eye 2 (two) times daily.   spironolactone (ALDACTONE) 25 MG tablet Take 1 tablet (25 mg total) by mouth daily.   traZODone (DESYREL) 50 MG tablet TAKE 1 TABLET BY MOUTH EVERYDAY AT BEDTIME    PHQ 2/9 Scores 08/29/2021 07/16/2021 07/10/2021 04/17/2021  PHQ - 2 Score 1 1 0 3  PHQ- 9 Score 3 4 0 7    GAD 7 : Generalized Anxiety Score 08/29/2021 07/10/2021 04/17/2021 02/14/2021  Nervous, Anxious, on Edge 1 1 1  0  Control/stop worrying 1 0 0 0  Worry too much - different things 1 1 1  0  Trouble relaxing 1 1 1  0  Restless 1 0 0 0  Easily annoyed or irritable 1 0 0 0  Afraid - awful might happen 1 1 0 0  Total GAD 7 Score 7 4 3  0  Anxiety Difficulty Not difficult at all - Not difficult at all -    BP Readings from Last 3 Encounters:  08/29/21 124/78  07/16/21 122/72  07/10/21 118/68    Physical Exam Vitals and nursing note reviewed.  Constitutional:      General: She is not in acute distress.    Appearance: She is well-developed.  HENT:     Head: Normocephalic and atraumatic.     Right Ear: Tympanic membrane and ear canal normal.     Left Ear: Tympanic membrane and ear canal normal.     Nose:     Right Sinus: No maxillary sinus tenderness or frontal sinus tenderness.     Left Sinus: No maxillary sinus tenderness or frontal sinus tenderness.  Eyes:     General: No scleral icterus.       Right eye: No discharge.        Left eye: No discharge.     Conjunctiva/sclera: Conjunctivae normal.  Neck:     Thyroid: No thyromegaly.     Vascular: No carotid bruit.  Cardiovascular:     Rate and Rhythm: Normal rate and regular  rhythm.     Pulses: Normal pulses.     Heart sounds: Normal heart sounds.  Pulmonary:     Effort: Pulmonary effort is normal. No respiratory distress.     Breath sounds: No wheezing.  Chest:  Breasts:    Right: No mass, nipple discharge, skin change or tenderness.     Left: No mass, nipple discharge, skin change  or tenderness.  Abdominal:     General: Bowel sounds are normal.     Palpations: Abdomen is soft.     Tenderness: There is no abdominal tenderness.  Musculoskeletal:     Right shoulder: Tenderness present. Decreased range of motion.     Left shoulder: Normal.     Cervical back: Normal range of motion. No erythema.     Lumbar back: Tenderness present. Decreased range of motion.     Right knee: Crepitus present. No effusion. Decreased range of motion.     Left knee: Crepitus present. No effusion. Decreased range of motion.     Right lower leg: No edema.     Left lower leg: No edema.  Lymphadenopathy:     Cervical: No cervical adenopathy.  Skin:    General: Skin is warm and dry.     Findings: No rash.  Neurological:     Mental Status: She is alert and oriented to person, place, and time.     Cranial Nerves: No cranial nerve deficit.     Sensory: No sensory deficit.     Deep Tendon Reflexes: Reflexes are normal and symmetric.  Psychiatric:        Attention and Perception: Attention normal.        Mood and Affect: Mood normal.    Wt Readings from Last 3 Encounters:  08/29/21 183 lb 9.6 oz (83.3 kg)  07/16/21 183 lb (83 kg)  07/10/21 183 lb (83 kg)    BP 124/78   Pulse 76   Temp 98.6 F (37 C) (Oral)   Ht 5\' 9"  (1.753 m)   Wt 183 lb 9.6 oz (83.3 kg)   SpO2 99%   BMI 27.11 kg/m   Assessment and Plan: 1. Annual physical exam Normal exam Immunizations and screenings are up to date  2. Encounter for screening mammogram for breast cancer Pt to schedule at [facility]  3. Essential (primary) hypertension Clinically stable exam with well controlled BP. Tolerating  medications without side effects at this time. Pt to continue current regimen and low sodium diet; benefits of regular exercise as able discussed. - CBC with Differential/Platelet - TSH - POCT urinalysis dipstick  4. Type II diabetes mellitus with complication (HCC) Last C4U was improving Intolerant of Farxiga Injecting insulin in abdomen and causing atrophy of the subcutaneous tissues - needs to rotate to the thighs for injections - Comprehensive metabolic panel  5. Hyperlipidemia associated with type 2 diabetes mellitus ([place]) Tolerating statin medication without side effects at this time LDL is at goal of < 70 on current dose Continue same therapy without change at this time. - Lipid panel  6. Stage 3a chronic kidney disease (HCC) Continue to monitor; avoid nsaids - Comprehensive metabolic panel  7. Sinus congestion No evidence of acute infection; recommend supportive care and follow up if needed  8. Old tear of meniscus of right knee, unspecified meniscus, unspecified tear type Knee and back pain is slowly worsening - she is now having right shoulder pain due to overuse Recommend Ortho evaluation to help address the chronic knee pain which should reduce shoulder discomfort Consider a lift chair if sx are progressive.   Partially dictated using Editor, commissioning. Any errors are unintentional.  Halina Maidens, MD [facility]  08/29/2021

## 2021-08-30 LAB — COMPREHENSIVE METABOLIC PANEL
ALT: 13 IU/L (ref 0–32)
AST: 17 IU/L (ref 0–40)
Albumin/Globulin Ratio: 1.6 (ref 1.2–2.2)
Albumin: 4.4 g/dL (ref 3.7–4.7)
Alkaline Phosphatase: 67 IU/L (ref 44–121)
BUN/Creatinine Ratio: 13 (ref 12–28)
BUN: 17 mg/dL (ref 8–27)
Bilirubin Total: 1.5 mg/dL — ABNORMAL HIGH (ref 0.0–1.2)
CO2: 24 mmol/L (ref 20–29)
Calcium: 9.2 mg/dL (ref 8.7–10.3)
Chloride: 102 mmol/L (ref 96–106)
Creatinine, Ser: 1.28 mg/dL — ABNORMAL HIGH (ref 0.57–1.00)
Globulin, Total: 2.8 g/dL (ref 1.5–4.5)
Glucose: 158 mg/dL — ABNORMAL HIGH (ref 70–99)
Potassium: 3.9 mmol/L (ref 3.5–5.2)
Sodium: 140 mmol/L (ref 134–144)
Total Protein: 7.2 g/dL (ref 6.0–8.5)
eGFR: 43 mL/min/{1.73_m2} — ABNORMAL LOW (ref >59)

## 2021-08-30 LAB — CBC WITH DIFFERENTIAL/PLATELET
Basophils Absolute: 0 10*3/uL (ref 0.0–0.2)
Basos: 1 %
EOS (ABSOLUTE): 0.1 10*3/uL (ref 0.0–0.4)
Eos: 1 %
Hematocrit: 44.6 % (ref 34.0–46.6)
Hemoglobin: 14.2 g/dL (ref 11.1–15.9)
Immature Grans (Abs): 0 10*3/uL (ref 0.0–0.1)
Immature Granulocytes: 1 %
Lymphocytes Absolute: 1.5 10*3/uL (ref 0.7–3.1)
Lymphs: 24 %
MCH: 28.1 pg (ref 26.6–33.0)
MCHC: 31.8 g/dL (ref 31.5–35.7)
MCV: 88 fL (ref 79–97)
Monocytes Absolute: 0.4 10*3/uL (ref 0.1–0.9)
Monocytes: 6 %
Neutrophils Absolute: 4.4 10*3/uL (ref 1.4–7.0)
Neutrophils: 67 %
Platelets: 182 10*3/uL (ref 150–450)
RBC: 5.05 x10E6/uL (ref 3.77–5.28)
RDW: 13 % (ref 11.7–15.4)
WBC: 6.5 10*3/uL (ref 3.4–10.8)

## 2021-08-30 LAB — LIPID PANEL
Chol/HDL Ratio: 2.7 ratio (ref 0.0–4.4)
Cholesterol, Total: 159 mg/dL (ref 100–199)
HDL: 58 mg/dL (ref >39)
LDL Chol Calc (NIH): 81 mg/dL (ref 0–99)
Triglycerides: 110 mg/dL (ref 0–149)
VLDL Cholesterol Cal: 20 mg/dL (ref 5–40)

## 2021-08-30 LAB — TSH: TSH: 2.32 u[IU]/mL (ref 0.450–4.500)

## 2021-09-03 DIAGNOSIS — H2512 Age-related nuclear cataract, left eye: Secondary | ICD-10-CM | POA: Diagnosis not present

## 2021-09-04 ENCOUNTER — Encounter: Payer: Self-pay | Admitting: Ophthalmology

## 2021-09-08 DIAGNOSIS — U071 COVID-19: Secondary | ICD-10-CM

## 2021-09-08 DIAGNOSIS — Z8616 Personal history of COVID-19: Secondary | ICD-10-CM

## 2021-09-08 HISTORY — DX: COVID-19: U07.1

## 2021-09-08 HISTORY — DX: Personal history of COVID-19: Z86.16

## 2021-09-10 NOTE — Discharge Instructions (Signed)

## 2021-09-14 ENCOUNTER — Ambulatory Visit: Payer: Self-pay | Admitting: *Deleted

## 2021-09-14 NOTE — Telephone Encounter (Signed)
Pt called in stating she has tested positive for covid since last Saturday and is still testing positive, she asked if someone could please give her a call to see what she needs to do further, please advise.   Reason for Disposition  COVID-19 Testing, questions about  Answer Assessment - Initial Assessment Questions 1. COVID-19 DIAGNOSIS: "Who made your COVID-19 diagnosis?" "Was it confirmed by a positive lab test or self-test?" If not diagnosed by a doctor (or NP/PA), ask "Are there lots of cases (community spread) where you live?" Note: See public health department website, if unsure.     Last Thursday- 11/3- home test 2. COVID-19 EXPOSURE: "Was there any known exposure to COVID before the symptoms began?" CDC Definition of close contact: within 6 feet (2 meters) for a total of 15 minutes or more over a 24-hour period.      Exposed to friend 3. ONSET: "When did the COVID-19 symptoms start?"      11/3 4. WORST SYMPTOM: "What is your worst symptom?" (e.g., cough, fever, shortness of breath, muscle aches)     Symptoms are better- fever 5. COUGH: "Do you have a cough?" If Yes, ask: "How bad is the cough?"       no 6. FEVER: "Do you have a fever?" If Yes, ask: "What is your temperature, how was it measured, and when did it start?"     2-3 days and cleared 7. RESPIRATORY STATUS: "Describe your breathing?" (e.g., shortness of breath, wheezing, unable to speak)      no 8. BETTER-SAME-WORSE: "Are you getting better, staying the same or getting worse compared to yesterday?"  If getting worse, ask, "In what way?"     better 9. HIGH RISK DISEASE: "Do you have any chronic medical problems?" (e.g., asthma, heart or lung disease, weak immune system, obesity, etc.)     diabetes 10. VACCINE: "Have you had the COVID-19 vaccine?" If Yes, ask: "Which one, how many shots, when did you get it?"       Yes- moderna 11. BOOSTER: "Have you received your COVID-19 booster?" If Yes, ask: "Which one and when did you  get it?"       yes 12. PREGNANCY: "Is there any chance you are pregnant?" "When was your last menstrual period?"       na 13. OTHER SYMPTOMS: "Do you have any other symptoms?"  (e.g., chills, fatigue, headache, loss of smell or taste, muscle pain, sore throat)       na 14. O2 SATURATION MONITOR:  "Do you use an oxygen saturation monitor (pulse oximeter) at home?" If Yes, ask "What is your reading (oxygen level) today?" "What is your usual oxygen saturation reading?" (e.g., 95%)       no  Protocols used: Coronavirus (COVID-19) Diagnosed or Suspected-A-AH

## 2021-09-14 NOTE — Telephone Encounter (Signed)
Patient has questions about COVID testing after + COVID test and infection. Patient advised per protocol- no further testing needed unless advised by provider for procedure or has symptoms within 90 day period.

## 2021-09-24 ENCOUNTER — Encounter: Payer: Self-pay | Admitting: Ophthalmology

## 2021-09-26 ENCOUNTER — Ambulatory Visit
Admission: RE | Admit: 2021-09-26 | Discharge: 2021-09-26 | Disposition: A | Discharge status: Home / Self Care | Payer: Medicare Other | Source: Ambulatory Visit | Attending: Internal Medicine | Admitting: Internal Medicine

## 2021-09-26 ENCOUNTER — Other Ambulatory Visit: Payer: Self-pay

## 2021-09-26 DIAGNOSIS — Z1231 Encounter for screening mammogram for malignant neoplasm of breast: Secondary | ICD-10-CM | POA: Diagnosis not present

## 2021-10-01 ENCOUNTER — Ambulatory Visit
Admission: RE | Admit: 2021-10-01 | Discharge: 2021-10-01 | Disposition: A | Discharge status: Home / Self Care | Payer: Medicare Other | Attending: Ophthalmology | Admitting: Ophthalmology

## 2021-10-01 ENCOUNTER — Encounter: Payer: Self-pay | Admitting: Ophthalmology

## 2021-10-01 ENCOUNTER — Ambulatory Visit: Payer: Medicare Other | Admitting: Anesthesiology

## 2021-10-01 ENCOUNTER — Encounter
Admission: RE | Disposition: A | Discharge status: Home / Self Care | Payer: Self-pay | Source: Home / Self Care | Attending: Ophthalmology

## 2021-10-01 ENCOUNTER — Other Ambulatory Visit: Payer: Self-pay

## 2021-10-01 DIAGNOSIS — H401122 Primary open-angle glaucoma, left eye, moderate stage: Secondary | ICD-10-CM | POA: Diagnosis not present

## 2021-10-01 DIAGNOSIS — Z87891 Personal history of nicotine dependence: Secondary | ICD-10-CM | POA: Diagnosis not present

## 2021-10-01 DIAGNOSIS — E1136 Type 2 diabetes mellitus with diabetic cataract: Secondary | ICD-10-CM | POA: Insufficient documentation

## 2021-10-01 DIAGNOSIS — I10 Essential (primary) hypertension: Secondary | ICD-10-CM | POA: Insufficient documentation

## 2021-10-01 DIAGNOSIS — F419 Anxiety disorder, unspecified: Secondary | ICD-10-CM | POA: Insufficient documentation

## 2021-10-01 DIAGNOSIS — H2512 Age-related nuclear cataract, left eye: Secondary | ICD-10-CM | POA: Insufficient documentation

## 2021-10-01 DIAGNOSIS — E785 Hyperlipidemia, unspecified: Secondary | ICD-10-CM | POA: Insufficient documentation

## 2021-10-01 DIAGNOSIS — H25812 Combined forms of age-related cataract, left eye: Secondary | ICD-10-CM | POA: Diagnosis not present

## 2021-10-01 DIAGNOSIS — I2510 Atherosclerotic heart disease of native coronary artery without angina pectoris: Secondary | ICD-10-CM | POA: Diagnosis not present

## 2021-10-01 HISTORY — PX: CATARACT EXTRACTION W/PHACO: SHX586

## 2021-10-01 LAB — GLUCOSE, CAPILLARY
Glucose-Capillary: 162 mg/dL — ABNORMAL HIGH (ref 70–99)
Glucose-Capillary: 167 mg/dL — ABNORMAL HIGH (ref 70–99)

## 2021-10-01 SURGERY — PHACOEMULSIFICATION, CATARACT, WITH IOL INSERTION
Anesthesia: Monitor Anesthesia Care | Site: Eye | Laterality: Left

## 2021-10-01 MED ORDER — TETRACAINE HCL 0.5 % OP SOLN
1.0000 [drp] | OPHTHALMIC | Status: DC | PRN
  Administered 2021-10-01 (×3): 1 [drp] via OPHTHALMIC

## 2021-10-01 MED ORDER — PHENYLEPHRINE HCL 10 % OP SOLN
1.0000 [drp] | OPHTHALMIC | Status: DC | PRN
  Administered 2021-10-01 (×3): 1 [drp] via OPHTHALMIC

## 2021-10-01 MED ORDER — ACETAMINOPHEN 160 MG/5ML PO SOLN: 325.0000 mg | ORAL | Status: DC | PRN

## 2021-10-01 MED ORDER — MIDAZOLAM HCL 2 MG/2ML IJ SOLN
INTRAMUSCULAR | Status: DC | PRN
  Administered 2021-10-01: 1 mg via INTRAVENOUS

## 2021-10-01 MED ORDER — ACETAMINOPHEN 325 MG PO TABS
325.0000 mg | ORAL_TABLET | ORAL | Status: DC | PRN
  Administered 2021-10-01: 09:00:00 650 mg via ORAL

## 2021-10-01 MED ORDER — MOXIFLOXACIN HCL 0.5 % OP SOLN
OPHTHALMIC | Status: DC | PRN
  Administered 2021-10-01: 0.2 mL via OPHTHALMIC

## 2021-10-01 MED ORDER — SIGHTPATH DOSE#1 SODIUM HYALURONATE 23 MG/ML IO SOLUTION
PREFILLED_SYRINGE | INTRAOCULAR | Status: DC | PRN
  Administered 2021-10-01: 08:00:00 0.6 mL via INTRAOCULAR

## 2021-10-01 MED ORDER — ONDANSETRON HCL 4 MG/2ML IJ SOLN: 4.0000 mg | Freq: Once | INTRAMUSCULAR | Status: DC | PRN

## 2021-10-01 MED ORDER — CYCLOPENTOLATE HCL 2 % OP SOLN
1.0000 [drp] | OPHTHALMIC | Status: DC | PRN
  Administered 2021-10-01 (×3): 1 [drp] via OPHTHALMIC

## 2021-10-01 MED ORDER — SIGHTPATH DOSE#1 BSS IO SOLN
INTRAOCULAR | Status: DC | PRN
  Administered 2021-10-01: 09:00:00 74 mL via OPHTHALMIC

## 2021-10-01 MED ORDER — SIGHTPATH DOSE#1 SODIUM HYALURONATE 10 MG/ML IO SOLUTION
PREFILLED_SYRINGE | INTRAOCULAR | Status: DC | PRN
  Administered 2021-10-01: 08:00:00 0.85 mL via INTRAOCULAR

## 2021-10-01 MED ORDER — LIDOCAINE HCL (PF) 2 % IJ SOLN
INTRAOCULAR | Status: DC | PRN
  Administered 2021-10-01: 08:00:00 1 mL via INTRAOCULAR

## 2021-10-01 MED ORDER — FENTANYL CITRATE (PF) 100 MCG/2ML IJ SOLN
INTRAMUSCULAR | Status: DC | PRN
  Administered 2021-10-01: 50 ug via INTRAVENOUS

## 2021-10-01 MED ORDER — SIGHTPATH DOSE#1 BSS IO SOLN
INTRAOCULAR | Status: DC | PRN
  Administered 2021-10-01: 15 mL

## 2021-10-01 SURGICAL SUPPLY — 14 items
CANNULA ANT/CHMB 27GA (MISCELLANEOUS) ×2 IMPLANT
DISSECTOR HYDRO NUCLEUS 50X22 (MISCELLANEOUS) ×2 IMPLANT
GLOVE SURG GAMMEX PI TX LF 7.5 (GLOVE) ×2 IMPLANT
GLOVE SURG SYN 8.5  E (GLOVE) ×1
GLOVE SURG SYN 8.5 E (GLOVE) ×1 IMPLANT
GOWN STRL REUS W/ TWL LRG LVL3 (GOWN DISPOSABLE) ×2 IMPLANT
GOWN STRL REUS W/TWL LRG LVL3 (GOWN DISPOSABLE) ×4
LENS IOL TECNIS EYHANCE 21.0 (Intraocular Lens) ×2 IMPLANT
PACK EYE AFTER SURG (MISCELLANEOUS) ×2 IMPLANT
STENT OPTH STRL GLAUCOMA ×2 IMPLANT
SYR 3ML LL SCALE MARK (SYRINGE) ×2 IMPLANT
SYR TB 1ML LUER SLIP (SYRINGE) ×2 IMPLANT
WATER STERILE IRR 250ML POUR (IV SOLUTION) ×2 IMPLANT
WIPE NON LINTING 3.25X3.25 (MISCELLANEOUS) ×2 IMPLANT

## 2021-10-01 NOTE — Anesthesia Procedure Notes (Signed)
Procedure Name: MAC Date/Time: 10/01/2021 8:20 AM Performed by: Cameron Ali, CRNA Pre-anesthesia Checklist: Patient identified, Emergency Drugs available, Suction available, Timeout performed and Patient being monitored Patient Re-evaluated:Patient Re-evaluated prior to induction Oxygen Delivery Method: Nasal cannula Placement Confirmation: positive ETCO2

## 2021-10-01 NOTE — Anesthesia Postprocedure Evaluation (Signed)
Anesthesia Post Note  Patient: Cathy Murphy  Procedure(s) Performed: CATARACT EXTRACTION PHACO AND INTRAOCULAR LENS PLACEMENT (IOC) LEFT HYDRUS MICROSTENT DIABETIC (Left: Eye)     Patient location during evaluation: PACU Anesthesia Type: MAC Level of consciousness: awake Pain management: pain level controlled Vital Signs Assessment: post-procedure vital signs reviewed and stable Respiratory status: respiratory function stable Cardiovascular status: stable Postop Assessment: no apparent nausea or vomiting Anesthetic complications: no   No notable events documented.  Veda Canning

## 2021-10-01 NOTE — Op Note (Signed)
OPERATIVE NOTE  [name] [id number] 10/01/2021  PREOPERATIVE DIAGNOSIS:   1.  Moderate PRIMARY open angle glaucoma, left  eye.  F29.2446 2.  Nuclear sclerotic cataract left eye.  H25.12   POSTOPERATIVE DIAGNOSIS:    same.   PROCEDURE:   1.  Placement of trabecular bypass stent (hydrus) and phacoemusification with posterior chamber intraocular lens placement of the left eye  CPT [phone]   LENS: Implant Name Type Inv. Item Serial No. Manufacturer Lot No. LRB No. Used Action  LENS IOL TECNIS EYHANCE 21.0 - [id number] Intraocular Lens LENS IOL TECNIS EYHANCE 21.0 [id number] [name]   Left 1 Implanted  STENT OPTH STRL GLAUCOMA - [id number]  STENT OPTH STRL GLAUCOMA  [employer] [id number] Left 1 Implanted      Procedure(s) with comments: CATARACT EXTRACTION PHACO AND INTRAOCULAR LENS PLACEMENT (IOC) LEFT HYDRUS MICROSTENT DIABETIC (Left) - Diabetic 7.49 00:42.1  DIB00 +21.0  SURGEON:  Benay Pillow, MD, MPH  ANESTHESIOLOGIST: Anesthesiologist: Veda Canning, MD CRNA: Cameron Ali, CRNA   ANESTHESIA:  MAC  and intracameral preservative-free intracameral lidocaine 4%.  ESTIMATED BLOOD LOSS: less than 1 mL.   COMPLICATIONS:  None.   DESCRIPTION OF PROCEDURE:  The patient was identified in the holding room and transported to the operating room.  The patient was placed in the supine position under the operating microscope.  The left eye was prepped and draped in the usual sterile ophthalmic fashion.   A 1.0 millimeter clear-corneal paracentesis was made at the 4:30 position and a second incision at 1:30.  0.5 ml of preservative-free 1% lidocaine with epinephrine was injected into the anterior chamber.  The anterior chamber was filled with Healon 5 viscoelastic.  A 2.4 millimeter keratome was used to make a near-clear corneal incision at the 3:00 position.   Attention was turned to the stent.  The patients head was turned to the left and the microscope was tilted to 035  degrees.  Ocular instruments/Glaukos OAL/H2 gonioprism was used with Healon 5 on the cornea was used to visualize the trabecular meshwork. The hydrus was introduced into the eye.  The meshwork was engaged with the tip and the stent was deployed into Schlemm's canal.  The stent was well seated and in good position.  Next, attention was turned to the phacoemulsification A curvilinear capsulorrhexis was made with a cystotome and capsulorrhexis forceps.  Balanced salt solution was used to hydrodissect and hydrodelineate the nucleus.   Phacoemulsification was then used in stop and chop fashion to remove the lens nucleus and epinucleus.  The remaining cortex was then removed using the irrigation and aspiration handpiece. Healon was then placed into the capsular bag to distend it for lens placement.  A lens was then injected into the capsular bag.  The remaining viscoelastic was aspirated.   Wounds were hydrated with balanced salt solution.  The anterior chamber was inflated to a physiologic pressure with balanced salt solution.   Intracameral vigamox 0.1 mL undiluted was injected into the eye and a drop placed onto the ocular surface.  No wound leaks were noted.  Protective glasses were placed on the patient.  The patient was taken to the recovery room in stable condition without complications of anesthesia or surgery   Benay Pillow 10/01/2021, 8:43 AM

## 2021-10-01 NOTE — Anesthesia Preprocedure Evaluation (Signed)
Anesthesia Evaluation  Patient identified by MRN, date of birth, ID band Patient awake    Reviewed: Allergy & Precautions, NPO status   Airway Mallampati: II  TM Distance: >3 FB     Dental   Pulmonary former smoker,    Pulmonary exam normal        Cardiovascular hypertension, + CAD   Rhythm:Regular Rate:Normal  HLD   Neuro/Psych  Headaches, Anxiety    GI/Hepatic   Endo/Other  diabetes, Type 2  Renal/GU      Musculoskeletal  (+) Arthritis ,   Abdominal   Peds  Hematology   Anesthesia Other Findings   Reproductive/Obstetrics                             Anesthesia Physical Anesthesia Plan  ASA: 3  Anesthesia Plan: MAC   Post-op Pain Management:    Induction: Intravenous  PONV Risk Score and Plan: TIVA, Midazolam and Treatment may vary due to age or medical condition  Airway Management Planned: Natural Airway and Nasal Cannula  Additional Equipment:   Intra-op Plan:   Post-operative Plan:   Informed Consent: I have reviewed the patients History and Physical, chart, labs and discussed the procedure including the risks, benefits and alternatives for the proposed anesthesia with the patient or authorized representative who has indicated his/her understanding and acceptance.       Plan Discussed with: CRNA  Anesthesia Plan Comments:         Anesthesia Quick Evaluation

## 2021-10-01 NOTE — H&P (Signed)
[facility]   Primary Care Physician:  Glean Hess, MD Ophthalmologist: Dr. Benay Pillow  Pre-Procedure History & Physical: HPI:  Cathy Murphy is a 80 y.o. female here for cataract surgery + hydrus microstent.   Past Medical History:  Diagnosis Date   Allergy    Anxiety    COVID-19 09/08/2021   Fever, chills, sneezing, runny nose.  Resolved.   Diabetes mellitus without complication ([place])    Glaucoma    Hyperlipidemia    Hypertension     Past Surgical History:  Procedure Laterality Date   ABDOMINAL HYSTERECTOMY     COLONOSCOPY WITH PROPOFOL N/A 11/25/2017   Procedure: COLONOSCOPY WITH PROPOFOL;  Surgeon: [name], MD;  Location: [facility]COPY;  Service: Endoscopy;  Laterality: N/A;   LIPOMA RESECTION     right posterior neck    Prior to Admission medications   Medication Sig Start Date End Date Taking? Authorizing Provider  cholecalciferol (VITAMIN D) 1000 units tablet Take 2,000 Units by mouth daily.   Yes [provider]  ezetimibe (ZETIA) 10 MG tablet Take 10 mg by mouth daily. 06/09/19  Yes [provider]  gabapentin (NEURONTIN) 100 MG capsule Take by mouth. 06/26/21  Yes [provider]  glipiZIDE (GLUCOTROL) 5 MG tablet TAKE 1 TABLET BY MOUTH TWICE A DAY 07/10/21  Yes Glean Hess, MD  hydrOXYzine (ATARAX/VISTARIL) 25 MG tablet Take 0.5-1 tablets (12.5-25 mg total) by mouth every 8 (eight) hours as needed for anxiety. 04/17/21  Yes Glean Hess, MD  JANUVIA 100 MG tablet TAKE 1 TABLET BY MOUTH EVERY DAY 08/20/21  Yes Glean Hess, MD  LEVEMIR FLEXTOUCH 100 UNIT/ML FlexTouch Pen INJECT 30 UNITS INTO THE SKIN DAILY. 07/19/21  Yes Glean Hess, MD  lisinopril (ZESTRIL) 40 MG tablet TAKE 1 TABLET BY MOUTH EVERY DAY 07/10/21  Yes Glean Hess, MD  LUMIGAN 0.01 % SOLN TAKE 1 DROP(S) IN BOTH EYES ONCE IN THE EVENING 03/01/15  Yes [provider]  pioglitazone (ACTOS) 15 MG tablet TAKE 1 TABLET BY MOUTH  EVERY DAY 05/14/21  Yes Glean Hess, MD  rosuvastatin (CRESTOR) 40 MG tablet TAKE 1 TABLET BY MOUTH EVERY DAY 07/01/21  Yes Glean Hess, MD  [facility] 1-0.2 % SUSP Apply 1 drop to eye 2 (two) times daily. 05/14/21  Yes [provider]  spironolactone (ALDACTONE) 25 MG tablet Take 1 tablet (25 mg total) by mouth daily. 04/17/21  Yes Glean Hess, MD  traZODone (DESYREL) 50 MG tablet TAKE 1 TABLET BY MOUTH EVERYDAY AT BEDTIME 02/05/21  Yes Glean Hess, MD  B-D UF III MINI PEN NEEDLES 31G X 5 MM MISC USE AS DIRECTED WITH LEVEMIR 09/25/20   Glean Hess, MD  glucose blood test strip Use to test blood sugar up to twice daily. 10/08/19   Glean Hess, MD  albuterol [facility] HFA) [phone] Base) MCG/ACT inhaler Inhale 2 puffs into the lungs every 6 (six) hours as needed for wheezing or shortness of breath. 02/18/19 12/30/19  Glean Hess, MD    Allergies as of 08/13/2021 - Review Complete 07/16/2021  Allergen Reaction Noted   Amoxicillin Diarrhea 06/15/2015   Aricept [donepezil] Other (See Comments) 02/14/2021   Aspirin Nausea And Vomiting 06/15/2015   Metformin and related Diarrhea 06/16/2015   Oxycodone Other (See Comments) 04/12/2016   Sulfa antibiotics  04/14/2015    Family History  Problem Relation Age of Onset   Cancer Mother  Breast   Breast cancer Mother    Heart disease Father    Heart attack Brother    Cancer Maternal Aunt        breast   Breast cancer Maternal Aunt    Breast cancer Cousin        mat cousin    Social History   Socioeconomic History   Marital status: Widowed    Spouse name: Not on file   Number of children: 1   Years of education: Not on file   Highest education level: 12th grade  Occupational History    Employer: [employer]  Tobacco Use   Smoking status: Former    Packs/day: 1.00    Years: 7.00    Pack years: 7.00    Types: Cigarettes    Quit date: 1965    Years since quitting: 57.9   Smokeless tobacco: Never    Tobacco comments:    smoking cessation materials not required  Vaping Use   Vaping Use: Never used  Substance and Sexual Activity   Alcohol use: No   Drug use: No   Sexual activity: Never  Other Topics Concern   Not on file  Social History Narrative   Pt lives alone.   Social Determinants of Health   Financial Resource Strain: Low Risk    Difficulty of Paying Living Expenses: Not hard at all  Food Insecurity: No Food Insecurity   Worried About Charity fundraiser in the Last Year: Never true   [place] in the Last Year: Never true  Transportation Needs: No Transportation Needs   Lack of Transportation (Medical): No   Lack of Transportation (Non-Medical): No  Physical Activity: Inactive   Days of Exercise per Week: 0 days   Minutes of Exercise per Session: 0 min  Stress: Stress Concern Present   Feeling of Stress : To some extent  Social Connections: Moderately Isolated   Frequency of Communication with Friends and Family: Not on file   Frequency of Social Gatherings with Friends and Family: Three times a week   Attends Religious Services: More than 4 times per year   Active Member of Clubs or Organizations: No   Attends Archivist Meetings: Never   Marital Status: Widowed  Human resources officer Violence: Not At Risk   Fear of Current or Ex-Partner: No   Emotionally Abused: No   Physically Abused: No   Sexually Abused: No    Review of Systems: See HPI, otherwise negative ROS  Physical Exam: BP (!) 165/75   Pulse 88   Temp (!) 97.5 F (36.4 C) (Temporal)   Ht 5\' 9"  (1.753 m)   Wt 83.9 kg   SpO2 99%   BMI 27.32 kg/m  General:   Alert, cooperative in NAD Head:  Normocephalic and atraumatic. Respiratory:  Normal work of breathing. Cardiovascular:  RRR  Impression/Plan: [name] is here for cataract surgery.  Risks, benefits, limitations, and alternatives regarding cataract surgery have been reviewed with the patient.  Questions have been  answered.  All parties agreeable.   Benay Pillow, MD  10/01/2021, 7:52 AM

## 2021-10-01 NOTE — Transfer of Care (Signed)
Immediate Anesthesia Transfer of Care Note  Patient: Cathy Murphy  Procedure(s) Performed: CATARACT EXTRACTION PHACO AND INTRAOCULAR LENS PLACEMENT (IOC) LEFT HYDRUS MICROSTENT DIABETIC (Left: Eye)  Patient Location: PACU  Anesthesia Type: MAC  Level of Consciousness: awake, alert  and patient cooperative  Airway and Oxygen Therapy: Patient Spontanous Breathing and Patient connected to supplemental oxygen  Post-op Assessment: Post-op Vital signs reviewed, Patient's Cardiovascular Status Stable, Respiratory Function Stable, Patent Airway and No signs of Nausea or vomiting  Post-op Vital Signs: Reviewed and stable  Complications: No notable events documented.

## 2021-10-02 ENCOUNTER — Encounter: Payer: Self-pay | Admitting: Ophthalmology

## 2021-10-07 ENCOUNTER — Other Ambulatory Visit: Payer: Self-pay | Admitting: Internal Medicine

## 2021-10-07 DIAGNOSIS — I10 Essential (primary) hypertension: Secondary | ICD-10-CM

## 2021-10-07 NOTE — Telephone Encounter (Signed)
Requested Prescriptions  Pending Prescriptions Disp Refills  . lisinopril (ZESTRIL) 40 MG tablet [Pharmacy Med Name: LISINOPRIL 40 MG TABLET] 90 tablet 1    Sig: TAKE 1 TABLET BY MOUTH EVERY DAY     Cardiovascular:  ACE Inhibitors Failed - 10/07/2021 12:48 AM      Failed - Cr in normal range and within 180 days    Creatinine  Date Value Ref Range Status  07/22/2012 0.96 0.60 - 1.30 mg/dL Final   Creatinine, Ser  Date Value Ref Range Status  08/29/2021 1.28 (H) 0.57 - 1.00 mg/dL Final         Failed - Last BP in normal range    BP Readings from Last 1 Encounters:  10/01/21 (!) 161/66         Passed - K in normal range and within 180 days    Potassium  Date Value Ref Range Status  08/29/2021 3.9 3.5 - 5.2 mmol/L Final  07/22/2012 3.6 3.5 - 5.1 mmol/L Final         Passed - Patient is not pregnant      Passed - Valid encounter within last 6 months    Recent Outpatient Visits          1 month ago Annual physical exam   [facility] Glean Hess, MD   2 months ago Subacute vaginitis   [facility] Glean Hess, MD   5 months ago Type II diabetes mellitus with complication [facility])   [facility] Glean Hess, MD   7 months ago Dizziness   [facility] Glean Hess, MD   10 months ago Cognitive complaints   [facility] Glean Hess, MD             . glipiZIDE (Nebo) 5 MG tablet [Pharmacy Med Name: GLIPIZIDE 5 MG TABLET] 180 tablet 1    Sig: TAKE 1 TABLET BY MOUTH TWICE A DAY     Endocrinology:  Diabetes - Sulfonylureas Failed - 10/07/2021 12:48 AM      Failed - HBA1C is between 0 and 7.9 and within 180 days    Hemoglobin A1C  Date Value Ref Range Status  07/10/2021 8.1 (A) 4.0 - 5.6 % Final   HbA1c, POC (prediabetic range)  Date Value Ref Range Status  12/11/2020 0 (A) 5.7 - 6.4 % Final   HbA1c, POC (controlled diabetic range)  Date Value Ref Range Status  12/11/2020 0.0 0.0 - 7.0 %  Final   HbA1c POC (<> result, manual entry)  Date Value Ref Range Status  12/11/2020 0 4.0 - 5.6 % Final   Hgb A1c MFr Bld  Date Value Ref Range Status  04/17/2021 10.6 (H) 4.8 - 5.6 % Final    Comment:             Prediabetes: 5.7 - 6.4          Diabetes: >6.4          Glycemic control for adults with diabetes: <7.0          Passed - Valid encounter within last 6 months    Recent Outpatient Visits          1 month ago Annual physical exam   [facility] Glean Hess, MD   2 months ago Subacute vaginitis   [facility] Glean Hess, MD   5 months ago Type II diabetes mellitus with complication [facility])   [facility] Glean Hess, MD   7 months ago Dizziness   [facility] Glean Hess, MD   10 months ago Cognitive complaints   [facility] Glean Hess, MD

## 2021-10-09 DIAGNOSIS — H2511 Age-related nuclear cataract, right eye: Secondary | ICD-10-CM | POA: Diagnosis not present

## 2021-10-09 NOTE — Discharge Instructions (Signed)

## 2021-10-11 ENCOUNTER — Ambulatory Visit
Admission: EM | Admit: 2021-10-11 | Discharge: 2021-10-11 | Disposition: A | Discharge status: Home / Self Care | Payer: Medicare Other | Attending: Emergency Medicine | Admitting: Emergency Medicine

## 2021-10-11 ENCOUNTER — Other Ambulatory Visit: Payer: Self-pay

## 2021-10-11 ENCOUNTER — Ambulatory Visit (INDEPENDENT_AMBULATORY_CARE_PROVIDER_SITE_OTHER): Payer: Medicare Other

## 2021-10-11 ENCOUNTER — Ambulatory Visit: Payer: Self-pay | Admitting: *Deleted

## 2021-10-11 DIAGNOSIS — S300XXA Contusion of lower back and pelvis, initial encounter: Secondary | ICD-10-CM | POA: Diagnosis not present

## 2021-10-11 DIAGNOSIS — Z043 Encounter for examination and observation following other accident: Secondary | ICD-10-CM | POA: Diagnosis not present

## 2021-10-11 MED ORDER — TRAMADOL HCL 50 MG PO TABS: 50.0000 mg | ORAL_TABLET | Freq: Four times a day (QID) | ORAL | 0 refills | Status: DC | PRN

## 2021-10-11 NOTE — ED Provider Notes (Signed)
[facility]    CSN: [id number] Arrival date & time: 10/11/21  1221      History   Chief Complaint Chief Complaint  Patient presents with   Fall   Back Pain    HPI Cathy Murphy is a 80 y.o. female.   HPI  80 year old female here for evaluation of back pain.  Patient reports that she is having pain in her sacrum and pelvis after suffering a fall yesterday.  She reports that she fell from a standing height and landed on her butt on a wooden floor covered by carpet.  She had to call her cousin to come help her get off the floor.  She reports that she had a previous fall at the beginning of November and had to call EMS to come help her off the floor.  She is not sure what made her fall.  She states that she did not have a loss of consciousness or hit her head when she fell.  She denies any numbness or tingling in her lower extremities.  She also denies weakness.  She has been using Tylenol at home with some relief of her pain.  She reports the pain is in her tailbone and feels like it is radiating to the left-hand side.  She is ambulatory.  Past Medical History:  Diagnosis Date   Allergy    Anxiety    COVID-19 09/08/2021   Fever, chills, sneezing, runny nose.  Resolved.   Diabetes mellitus without complication ([place])    Glaucoma    Hyperlipidemia    Hypertension     Patient Active Problem List   Diagnosis Date Noted   Difficulty sleeping 12/29/2020   Mild cognitive impairment 12/29/2020   Headache disorder 12/29/2020   Stage 3a chronic kidney disease ([place]) 08/16/2020   Osteopenia determined by x-ray 08/05/2019   Atherosclerosis of abdominal aorta ([place]) 06/09/2019   Coronary artery disease involving native coronary artery of native heart 06/09/2019   BMI 24.0-24.9, adult 11/06/2018   Bilateral neck pain 10/08/2018   Aortic calcification ([place]) 03/05/2018   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   Ventral hernia without obstruction or gangrene 03/06/2016   Type II  diabetes mellitus with complication ([place]) 46/27/0350   Plantar fasciitis, right 06/16/2015   Knee torn cartilage 04/26/2015   Allergic rhinitis, seasonal 04/26/2015   Essential (primary) hypertension 04/26/2015   Glaucoma 04/26/2015   Hyperlipidemia associated with type 2 diabetes mellitus ([place]) 04/26/2015   Insomnia 04/26/2015    Past Surgical History:  Procedure Laterality Date   ABDOMINAL HYSTERECTOMY     CATARACT EXTRACTION W/PHACO Left 10/01/2021   Procedure: CATARACT EXTRACTION PHACO AND INTRAOCULAR LENS PLACEMENT ([place]) LEFT HYDRUS MICROSTENT DIABETIC;  Surgeon: Eulogio Bear, MD;  Location: [facility];  Service: Ophthalmology;  Laterality: Left;  Diabetic 7.49 00:42.1   COLONOSCOPY WITH PROPOFOL N/A 11/25/2017   Procedure: COLONOSCOPY WITH PROPOFOL;  Surgeon: [name], MD;  Location: [facility] ENDOSCOPY;  Service: Endoscopy;  Laterality: N/A;   LIPOMA RESECTION     right posterior neck    OB History   No obstetric history on file.      Home Medications    Prior to Admission medications   Medication Sig Start Date End Date Taking? Authorizing Provider  traMADol (ULTRAM) 50 MG tablet Take 1 tablet (50 mg total) by mouth every 6 (six) hours as needed. 10/11/21  Yes Margarette Canada, NP  B-D UF III MINI PEN NEEDLES 31G X 5 MM MISC USE AS  DIRECTED WITH LEVEMIR 09/25/20   Glean Hess, MD  cholecalciferol (VITAMIN D) 1000 units tablet Take 2,000 Units by mouth daily.    [provider]  ezetimibe (ZETIA) 10 MG tablet Take 10 mg by mouth daily. 06/09/19   [provider]  gabapentin (NEURONTIN) 100 MG capsule Take by mouth. 06/26/21   [provider]  glipiZIDE (GLUCOTROL) 5 MG tablet TAKE 1 TABLET BY MOUTH TWICE A DAY 10/07/21   Glean Hess, MD  glucose blood test strip Use to test blood sugar up to twice daily. 10/08/19   Glean Hess, MD  hydrOXYzine (ATARAX/VISTARIL) 25 MG tablet Take 0.5-1 tablets (12.5-25 mg total) by  mouth every 8 (eight) hours as needed for anxiety. 04/17/21   Glean Hess, MD  JANUVIA 100 MG tablet TAKE 1 TABLET BY MOUTH EVERY DAY 08/20/21   Glean Hess, MD  LEVEMIR FLEXTOUCH 100 UNIT/ML FlexTouch Pen INJECT 30 UNITS INTO THE SKIN DAILY. 07/19/21   Glean Hess, MD  lisinopril (ZESTRIL) 40 MG tablet TAKE 1 TABLET BY MOUTH EVERY DAY 10/07/21   Glean Hess, MD  LUMIGAN 0.01 % SOLN TAKE 1 DROP(S) IN BOTH EYES ONCE IN THE EVENING 03/01/15   [provider]  pioglitazone (ACTOS) 15 MG tablet TAKE 1 TABLET BY MOUTH EVERY DAY 05/14/21   Glean Hess, MD  rosuvastatin (CRESTOR) 40 MG tablet TAKE 1 TABLET BY MOUTH EVERY DAY 07/01/21   Glean Hess, MD  SIMBRINZA 1-0.2 % SUSP Apply 1 drop to eye 2 (two) times daily. 05/14/21   [provider]  spironolactone (ALDACTONE) 25 MG tablet Take 1 tablet (25 mg total) by mouth daily. 04/17/21   Glean Hess, MD  traZODone (DESYREL) 50 MG tablet TAKE 1 TABLET BY MOUTH EVERYDAY AT BEDTIME 02/05/21   Glean Hess, MD  albuterol [facility] HFA) [phone] Base) MCG/ACT inhaler Inhale 2 puffs into the lungs every 6 (six) hours as needed for wheezing or shortness of breath. 02/18/19 12/30/19  Glean Hess, MD    Family History Family History  Problem Relation Age of Onset   Cancer Mother        Breast   Breast cancer Mother    Heart disease Father    Heart attack Brother    Cancer Maternal Aunt        breast   Breast cancer Maternal Aunt    Breast cancer Cousin        mat cousin    Social History Social History   Tobacco Use   Smoking status: Former    Packs/day: 1.00    Years: 7.00    Pack years: 7.00    Types: Cigarettes    Quit date: 1965    Years since quitting: 57.9   Smokeless tobacco: Never   Tobacco comments:    smoking cessation materials not required  Vaping Use   Vaping Use: Never used  Substance Use Topics   Alcohol use: No   Drug use: No     Allergies   Alprazolam,  Amoxicillin, Aricept [donepezil], Aspirin, Metformin and related, Oxycodone, Riomet [metformin hcl], Sulfa antibiotics, Hydrocodone, Nabumetone, and Skelaxin [metaxalone]   Review of Systems Review of Systems  Constitutional:  Negative for activity change, appetite change and fever.  Musculoskeletal:  Positive for back pain. Negative for arthralgias and neck pain.  Skin:  Negative for color change and wound.  Neurological:  Negative for weakness and numbness.    Physical Exam  Triage Vital Signs ED Triage Vitals  Enc Vitals Group     BP 10/11/21 1257 130/69     Pulse Rate 10/11/21 1257 92     Resp 10/11/21 1257 16     Temp 10/11/21 1257 98.3 F (36.8 C)     Temp Source 10/11/21 1257 Oral     SpO2 10/11/21 1257 99 %     Weight --      Height --      Head Circumference --      Peak Flow --      Pain Score 10/11/21 1256 8     Pain Loc --      Pain Edu? --      Excl. in [place]? --    No data found.  Updated Vital Signs BP 130/69 (BP Location: Left Arm)   Pulse 92   Temp 98.3 F (36.8 C) (Oral)   Resp 16   SpO2 99%   Visual Acuity Right Eye Distance:   Left Eye Distance:   Bilateral Distance:    Right Eye Near:   Left Eye Near:    Bilateral Near:     Physical Exam Vitals and nursing note reviewed.  Constitutional:      General: She is in acute distress.     Appearance: Normal appearance.  HENT:     Head: Normocephalic and atraumatic.  Cardiovascular:     Rate and Rhythm: Normal rate and regular rhythm.     Pulses: Normal pulses.     Heart sounds: Normal heart sounds. No murmur heard.   No gallop.  Pulmonary:     Effort: Pulmonary effort is normal.     Breath sounds: Normal breath sounds. No wheezing, rhonchi or rales.  Musculoskeletal:        General: Tenderness present. No swelling or deformity. Normal range of motion.  Skin:    General: Skin is warm and dry.     Capillary Refill: Capillary refill takes less than 2 seconds.     Findings: No bruising or  erythema.  Neurological:     General: No focal deficit present.     Mental Status: She is alert and oriented to person, place, and time.     Sensory: No sensory deficit.     Motor: No weakness.  Psychiatric:        Mood and Affect: Mood normal.        Behavior: Behavior normal.        Thought Content: Thought content normal.        Judgment: Judgment normal.     UC Treatments / Results  Labs (all labs ordered are listed, but only abnormal results are displayed) Labs Reviewed - No data to display  EKG   Radiology DG Sacrum/Coccyx  Result Date: 10/11/2021 CLINICAL DATA:  80 year old female status post fall. EXAM: SACRUM AND COCCYX - 2+ VIEW COMPARISON:  03/11/2012 FINDINGS: There is no evidence of fracture or other focal bone lesions. Diffuse osteopenia. Multiple surgical clips within the pelvis, unchanged from comparison. IMPRESSION: 1. No acute fracture or malalignment. 2. Diffuse osteopenia. Electronically Signed   By: Ruthann Cancer M.D.   On: 10/11/2021 13:34    Procedures Procedures (including critical care time)  Medications Ordered in UC Medications - No data to display  Initial Impression / Assessment and Plan / UC Course  I have reviewed the triage vital signs and the nursing notes.  Pertinent labs & imaging results that were available during my care of the  patient were reviewed by me and considered in my medical decision making (see chart for details).  Patient is a pleasant 80 year old female who appears to be in a moderate degree of pain here for evaluation of coccygeal pain status post fall yesterday.  She states that she fell and impacted her bottom on a carpeted floor overlies hardwood.  She is unsure of what made her fall but states that she did not strike her head and did not have a loss of consciousness.  She did have to call her cousin and her cousin's husband to come help her get on the floor.  She reports that beginning of November she had a another fall for  an unknown reason and had to have the rescue squad come to her house to get her on the floor.  Patient is ambulatory and she is able to transition from the chair onto the exam table with a minimal degree of difficulty secondary to pain.  She has normal coordination and normal bilateral lower extremity strength.  There is no bruising overlying the skin of the lumbar and upper sacral region.  There is no midline spinal tenderness of the lumbar spine but there is tenderness when palpating over the lower sacrum and coccygeal spine.  There is no tenderness when palpating the iliac crest or paraspinous areas of the lumbar region bilaterally.  Cardiopulmonary exam is benign.  We will obtain radiograph of sacrum and coccyx to look for any bony abnormality as patient does have a history of osteopenia.  Sacrum and coccyx x-rays independently reviewed and evaluated by me.  Impression: There is a questionable fracture of the distal segment of the coccyx with minimal displacement.  Patient is significant degeneration of her lumbar and sacral spine.  There are multiple postsurgical clips and throughout the pelvis as well.  Radiology overread is pending. Radiology impression is negative for fracture or displacement of the bones.  Patient does have significant osteopenia.  Will discharge patient home with a diagnosis of contusion of her coccyx and have her continue Tylenol for mild to moderate pain, will give a small prescription for tramadol for severe pain, and will encourage her to use a cushion donut to help keep pressure off of her coccyx and to alleviate pain.   Final Clinical Impressions(s) / UC Diagnoses   Final diagnoses:  Contusion of coccyx, initial encounter     Discharge Instructions      Your x-rays today did not show the presence of any broken bones.  I believe that you have bruised your tailbone as a result of your fall.  Continue to use Tylenol as needed for mild to moderate pain.  Purchase  a donut cushion that she can use to sit on 12 take pressure off of your tailbone and alleviate pain.  You can use tramadol as needed for severe pain.  Do not take this with your trazodone at nighttime for sleep.  If your symptoms continue, or they worsen, follow-up with your primary care provider or return for reevaluation.     ED Prescriptions     Medication Sig Dispense Auth. Provider   traMADol (ULTRAM) 50 MG tablet Take 1 tablet (50 mg total) by mouth every 6 (six) hours as needed. 15 tablet Margarette Canada, NP      I have reviewed the PDMP during this encounter.   Margarette Canada, NP 10/11/21 1342

## 2021-10-11 NOTE — Telephone Encounter (Signed)
Reason for Disposition  [1] Fall AND [2] went to emergency department for evaluation or treatment  Answer Assessment - Initial Assessment Questions 1. MECHANISM: "How did the fall happen?"     Walking from the bathroom to the bed and fell 2. DOMESTIC VIOLENCE AND ELDER ABUSE SCREENING: "Did you fall because someone pushed you or tried to hurt you?" If Yes, ask: "Are you safe now?"     Na  3. ONSET: "When did the fall happen?" (e.g., minutes, hours, or days ago)     Early this am 5 am  4. LOCATION: "What part of the body hit the ground?" (e.g., back, buttocks, head, hips, knees, hands, head, stomach)     Buttocks 5. INJURY: "Did you hurt (injure) yourself when you fell?" If Yes, ask: "What did you injure? Tell me more about this?" (e.g., body area; type of injury; pain severity)"     Buttocks  6. PAIN: "Is there any pain?" If Yes, ask: "How bad is the pain?" (e.g., Scale 1-10; or mild,  moderate, severe)   - NONE (0): No pain   - MILD (1-3): Doesn't interfere with normal activities    - MODERATE (4-7): Interferes with normal activities or awakens from sleep    - SEVERE (8-10): Excruciating pain, unable to do any normal activities      Severe 9  7. SIZE: For cuts, bruises, or swelling, ask: "How large is it?" (e.g., inches or centimeters)     None  8. PREGNANCY: "Is there any chance you are pregnant?" "When was your last menstrual period?"     na 9. OTHER SYMPTOMS: "Do you have any other symptoms?" (e.g., dizziness, fever, weakness; new onset or worsening).      None  10. CAUSE: "What do you think caused the fall (or falling)?" (e.g., tripped, dizzy spell)       Not sure  Protocols used: Falls and [facility]

## 2021-10-11 NOTE — Telephone Encounter (Signed)
C/o fall this am at 5 am walking from bathroom to bed and fell on buttocks. C/o severe pain. Went to [facility] for evaluation. Patient reports she is wanting appt for f/u why she is falling due to multiple falls in the past 2 months . Can walk with out assistance. Denies hitting head or other parts of the body. Care advise given. Patient verbalized understanding of care advise and to call back or go to ED or call 911 if symptoms worsen. Appt scheduled for 10/12/21.

## 2021-10-11 NOTE — ED Triage Notes (Signed)
Patient presents to Urgent Care with complaints of a fall yesterday. She states she landed on her butt. Treating pain with Tylenol.   Denies head trauma or LOC.

## 2021-10-11 NOTE — Discharge Instructions (Addendum)
Your x-rays today did not show the presence of any broken bones.  I believe that you have bruised your tailbone as a result of your fall.  Continue to use Tylenol as needed for mild to moderate pain.  Purchase a donut cushion that she can use to sit on 12 take pressure off of your tailbone and alleviate pain.  You can use tramadol as needed for severe pain.  Do not take this with your trazodone at nighttime for sleep.  If your symptoms continue, or they worsen, follow-up with your primary care provider or return for reevaluation.

## 2021-10-12 ENCOUNTER — Ambulatory Visit (INDEPENDENT_AMBULATORY_CARE_PROVIDER_SITE_OTHER): Payer: Medicare Other | Admitting: Internal Medicine

## 2021-10-12 ENCOUNTER — Encounter: Payer: Self-pay | Admitting: Internal Medicine

## 2021-10-12 VITALS — BP 112/64 | HR 84 | Temp 98.0°F | Ht 69.0 in | Wt 180.0 lb

## 2021-10-12 DIAGNOSIS — S300XXA Contusion of lower back and pelvis, initial encounter: Secondary | ICD-10-CM

## 2021-10-12 DIAGNOSIS — R55 Syncope and collapse: Secondary | ICD-10-CM | POA: Diagnosis not present

## 2021-10-12 NOTE — Telephone Encounter (Signed)
Noted  Pt has an appt today 10/12/2021.  KP

## 2021-10-12 NOTE — Patient Instructions (Addendum)
Take Tylenol [phone] mg every 8 hours for pain from fall. Use heat for 30 min at a time as needed  See Dr. Nehemiah Massed for further evaluation. Tuesday 12/13 at 10:15 in [facility]

## 2021-10-12 NOTE — Progress Notes (Signed)
Date:  10/12/2021   Name:  Cathy Murphy   DOB:  Jan 06, 1941   MRN:  [id number]   Chief Complaint: Fall  Fall The accident occurred 2 days ago. Fall occurred: standing trying to get into bed. She fell from a height of 1 to 2 ft. She landed on [place]. There was no blood loss. The point of impact was the buttocks. The pain is present in the buttocks. The pain is at a severity of 9/10. The pain is moderate. The symptoms are aggravated by sitting, standing and movement. Pertinent negatives include no fever, headaches or vomiting. Treatments tried: tramadol. The treatment provided no relief.  Ths is the second fall that occurred with no warning.  She does not remember the seconds prior to or during the fall.  Just became aware after she was on the flood with buttock pain.  She did not hit her head, was not dizzy before.  She has to get help to get up.  [facility] evaluation - xray without fracture. Another time she ended up on the floor in the kitchen, again without warning and with no idea how she fell.  No injury that time.  Family is concerned, she has fallen four times this year.  Lab Results  Component Value Date   NA 140 08/29/2021   K 3.9 08/29/2021   CO2 24 08/29/2021   GLUCOSE 158 (H) 08/29/2021   BUN 17 08/29/2021   CREATININE 1.28 (H) 08/29/2021   CALCIUM 9.2 08/29/2021   EGFR 43 (L) 08/29/2021   GFRNONAA 38 (L) 08/16/2020   Lab Results  Component Value Date   CHOL 159 08/29/2021   HDL 58 08/29/2021   LDLCALC 81 08/29/2021   TRIG 110 08/29/2021   CHOLHDL 2.7 08/29/2021   Lab Results  Component Value Date   TSH 2.320 08/29/2021   Lab Results  Component Value Date   HGBA1C 8.1 (A) 07/10/2021   Lab Results  Component Value Date   WBC 6.5 08/29/2021   HGB 14.2 08/29/2021   HCT 44.6 08/29/2021   MCV 88 08/29/2021   PLT 182 08/29/2021   Lab Results  Component Value Date   ALT 13 08/29/2021   AST 17 08/29/2021   ALKPHOS 67 08/29/2021   BILITOT 1.5 (H) 08/29/2021   Lab  Results  Component Value Date   VD25OH 12.8 (L) 06/15/2015     Review of Systems  Constitutional:  Negative for chills, fatigue and fever.  Respiratory:  Negative for cough, chest tightness, shortness of breath and wheezing.   Cardiovascular:  Negative for chest pain and palpitations.  Gastrointestinal:  Negative for diarrhea and vomiting.  Musculoskeletal:  Positive for back pain (coccyx pain) and myalgias.  Neurological:  Positive for syncope. Negative for dizziness, tremors, speech difficulty, weakness and headaches.  Psychiatric/Behavioral:  Negative for dysphoric mood, self-injury and sleep disturbance.    Patient Active Problem List   Diagnosis Date Noted   Difficulty sleeping 12/29/2020   Mild cognitive impairment 12/29/2020   Headache disorder 12/29/2020   Stage 3a chronic kidney disease ([place]) 08/16/2020   Osteopenia determined by x-ray 08/05/2019   Atherosclerosis of abdominal aorta ([place]) 06/09/2019   Coronary artery disease involving native coronary artery of native heart 06/09/2019   BMI 24.0-24.9, adult 11/06/2018   Bilateral neck pain 10/08/2018   Aortic calcification ([place]) 03/05/2018   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   Ventral hernia without obstruction or gangrene 03/06/2016   Type II diabetes mellitus with complication ([place]) 75/88/3254  Plantar fasciitis, right 06/16/2015   Knee torn cartilage 04/26/2015   Allergic rhinitis, seasonal 04/26/2015   Essential (primary) hypertension 04/26/2015   Glaucoma 04/26/2015   Hyperlipidemia associated with type 2 diabetes mellitus ([place]) 04/26/2015   Insomnia 04/26/2015    Allergies  Allergen Reactions   Alprazolam Nausea Only   Amoxicillin Diarrhea   Aricept [Donepezil] Other (See Comments)    Leg cramps   Aspirin Nausea And Vomiting   Metformin And Related Diarrhea   Oxycodone Other (See Comments)    CNS side effects/agitation   Riomet [Metformin Hcl] Nausea And Vomiting   Sulfa Antibiotics    Hydrocodone Rash    Nabumetone Rash   Skelaxin [Metaxalone] Rash    Past Surgical History:  Procedure Laterality Date   ABDOMINAL HYSTERECTOMY     CATARACT EXTRACTION W/PHACO Left 10/01/2021   Procedure: CATARACT EXTRACTION PHACO AND INTRAOCULAR LENS PLACEMENT ([place]) LEFT HYDRUS MICROSTENT DIABETIC;  Surgeon: Eulogio Bear, MD;  Location: [place];  Service: Ophthalmology;  Laterality: Left;  Diabetic 7.49 00:42.1   COLONOSCOPY WITH PROPOFOL N/A 11/25/2017   Procedure: COLONOSCOPY WITH PROPOFOL;  Surgeon: [name], MD;  Location[facility] ENDOSCOPY;  Service: Endoscopy;  Laterality: N/A;   LIPOMA RESECTION     right posterior neck    Social History   Tobacco Use   Smoking status: Former    Packs/day: 1.00    Years: 7.00    Pack years: 7.00    Types: Cigarettes    Quit date: 1965    Years since quitting: 57.9   Smokeless tobacco: Never   Tobacco comments:    smoking cessation materials not required  Vaping Use   Vaping Use: Never used  Substance Use Topics   Alcohol use: No   Drug use: No     Medication list has been reviewed and updated.  Current Meds  Medication Sig   B-D UF III MINI PEN NEEDLES 31G X 5 MM MISC USE AS DIRECTED WITH LEVEMIR   cholecalciferol (VITAMIN D) 1000 units tablet Take 2,000 Units by mouth daily.   ezetimibe (ZETIA) 10 MG tablet Take 10 mg by mouth daily.   gabapentin (NEURONTIN) 100 MG capsule Take by mouth.   glipiZIDE (GLUCOTROL) 5 MG tablet TAKE 1 TABLET BY MOUTH TWICE A DAY   glucose blood test strip Use to test blood sugar up to twice daily.   hydrOXYzine (ATARAX/VISTARIL) 25 MG tablet Take 0.5-1 tablets (12.5-25 mg total) by mouth every 8 (eight) hours as needed for anxiety.   JANUVIA 100 MG tablet TAKE 1 TABLET BY MOUTH EVERY DAY   LEVEMIR FLEXTOUCH 100 UNIT/ML FlexTouch Pen INJECT 30 UNITS INTO THE SKIN DAILY.   lisinopril (ZESTRIL) 40 MG tablet TAKE 1 TABLET BY MOUTH EVERY DAY   LUMIGAN 0.01 % SOLN TAKE 1 DROP(S) IN BOTH EYES  ONCE IN THE EVENING   pioglitazone (ACTOS) 15 MG tablet TAKE 1 TABLET BY MOUTH EVERY DAY   rosuvastatin (CRESTOR) 40 MG tablet TAKE 1 TABLET BY MOUTH EVERY DAY   SIMBRINZA 1-0.2 % SUSP Apply 1 drop to eye 2 (two) times daily.   spironolactone (ALDACTONE) 25 MG tablet Take 1 tablet (25 mg total) by mouth daily.   traMADol (ULTRAM) 50 MG tablet Take 1 tablet (50 mg total) by mouth every 6 (six) hours as needed.   traZODone (DESYREL) 50 MG tablet TAKE 1 TABLET BY MOUTH EVERYDAY AT BEDTIME    PHQ 2/9 Scores 10/12/2021 08/29/2021 07/16/2021 07/10/2021  PHQ - 2  Score 0 1 1 0  PHQ- 9 Score $Remov'2 3 4 'mcepjB$ 0    GAD 7 : Generalized Anxiety Score 10/12/2021 08/29/2021 07/10/2021 04/17/2021  Nervous, Anxious, on Edge $Remov'1 1 1 1  'VUXGuA$ Control/stop worrying 0 1 0 0  Worry too much - different things 0 $Remove'1 1 1  'ogcLnLX$ Trouble relaxing $RemoveBeforeDE'1 1 1 1  'xsobcANizXHEyPa$ Restless 0 1 0 0  Easily annoyed or irritable 0 1 0 0  Afraid - awful might happen 0 1 1 0  Total GAD 7 Score $Remov'2 7 4 3  'AfEhmB$ Anxiety Difficulty Not difficult at all Not difficult at all - Not difficult at all    BP Readings from Last 3 Encounters:  10/12/21 112/64  10/11/21 130/69  10/01/21 (!) 161/66    Physical Exam Vitals and nursing note reviewed.  Constitutional:      General: She is not in acute distress.    Appearance: She is well-developed. She is not ill-appearing.  HENT:     Head: Normocephalic and atraumatic.  Neck:     Vascular: No carotid bruit.  Cardiovascular:     Rate and Rhythm: Normal rate and regular rhythm. Occasional Extrasystoles are present.    Pulses: Normal pulses.     Heart sounds: No murmur heard. Pulmonary:     Effort: Pulmonary effort is normal. No respiratory distress.  Musculoskeletal:     Cervical back: Normal range of motion.     Right lower leg: No edema.     Left lower leg: No edema.     Comments: Tender over coccxy No bruising or swelling noted No lumbar spinal tenderness  Lymphadenopathy:     Cervical: No cervical adenopathy.  Skin:     General: Skin is warm and dry.     Findings: No rash.  Neurological:     Mental Status: She is alert and oriented to person, place, and time.     Sensory: Sensation is intact.     Motor: Motor function is intact. No tremor, seizure activity or pronator drift.     Coordination: Coordination normal. Finger-Nose-Finger Test normal. Rapid alternating movements normal.     Gait: Gait is intact.  Psychiatric:        Mood and Affect: Mood normal.        Speech: Speech normal.        Behavior: Behavior normal.    Wt Readings from Last 3 Encounters:  10/12/21 180 lb (81.6 kg)  10/01/21 185 lb (83.9 kg)  08/29/21 183 lb 9.6 oz (83.3 kg)    BP 112/64   Pulse 84   Temp 98 F (36.7 C) (Oral)   Ht $R'5\' 9"'dg$  (1.753 m)   Wt 180 lb (81.6 kg)   SpO2 98%   BMI 26.58 kg/m   Assessment and Plan: 1. Syncope, unspecified syncope type Appt with Dr. Nehemiah Massed 12/13 at 10:15 in [place] - US Carotid Duplex Bilateral; Future  2. Contusion of coccyx, initial encounter Take tylenol and use heat as needed  Discussed with patient and daughter Dorethea.  Agree with plans for Life Alert and close family support.  Partially dictated using Editor, commissioning. Any errors are unintentional.  Halina Maidens, MD [facility]  10/12/2021

## 2021-10-15 ENCOUNTER — Other Ambulatory Visit: Payer: Self-pay

## 2021-10-15 ENCOUNTER — Ambulatory Visit
Admission: RE | Admit: 2021-10-15 | Discharge: 2021-10-15 | Disposition: A | Discharge status: Home / Self Care | Payer: Medicare Other | Attending: Ophthalmology | Admitting: Ophthalmology

## 2021-10-15 ENCOUNTER — Encounter: Payer: Self-pay | Admitting: Ophthalmology

## 2021-10-15 ENCOUNTER — Ambulatory Visit: Payer: Medicare Other | Admitting: Anesthesiology

## 2021-10-15 ENCOUNTER — Encounter
Admission: RE | Disposition: A | Discharge status: Home / Self Care | Payer: Self-pay | Source: Home / Self Care | Attending: Ophthalmology

## 2021-10-15 DIAGNOSIS — H401112 Primary open-angle glaucoma, right eye, moderate stage: Secondary | ICD-10-CM | POA: Insufficient documentation

## 2021-10-15 DIAGNOSIS — H25811 Combined forms of age-related cataract, right eye: Secondary | ICD-10-CM | POA: Diagnosis not present

## 2021-10-15 DIAGNOSIS — H2511 Age-related nuclear cataract, right eye: Secondary | ICD-10-CM | POA: Insufficient documentation

## 2021-10-15 DIAGNOSIS — E1136 Type 2 diabetes mellitus with diabetic cataract: Secondary | ICD-10-CM | POA: Diagnosis not present

## 2021-10-15 DIAGNOSIS — Z87891 Personal history of nicotine dependence: Secondary | ICD-10-CM | POA: Insufficient documentation

## 2021-10-15 HISTORY — PX: CATARACT EXTRACTION W/PHACO: SHX586

## 2021-10-15 LAB — GLUCOSE, CAPILLARY
Glucose-Capillary: 267 mg/dL — ABNORMAL HIGH (ref 70–99)
Glucose-Capillary: 264 mg/dL — ABNORMAL HIGH (ref 70–99)

## 2021-10-15 SURGERY — PHACOEMULSIFICATION, CATARACT, WITH IOL INSERTION
Anesthesia: Monitor Anesthesia Care | Site: Eye | Laterality: Right

## 2021-10-15 MED ORDER — SIGHTPATH DOSE#1 SODIUM HYALURONATE 10 MG/ML IO SOLUTION
PREFILLED_SYRINGE | INTRAOCULAR | Status: DC | PRN
  Administered 2021-10-15: 0.85 mL via INTRAOCULAR

## 2021-10-15 MED ORDER — TETRACAINE HCL 0.5 % OP SOLN
1.0000 [drp] | OPHTHALMIC | Status: AC | PRN
  Administered 2021-10-15 (×3): 1 [drp] via OPHTHALMIC

## 2021-10-15 MED ORDER — NEOMYCIN-POLYMYXIN-DEXAMETH 3.5-10000-0.1 OP OINT
TOPICAL_OINTMENT | OPHTHALMIC | Status: DC | PRN
  Administered 2021-10-15: 1 via OPHTHALMIC

## 2021-10-15 MED ORDER — SIGHTPATH DOSE#1 SODIUM HYALURONATE 23 MG/ML IO SOLUTION
PREFILLED_SYRINGE | INTRAOCULAR | Status: DC | PRN
  Administered 2021-10-15: 0.6 mL via INTRAOCULAR

## 2021-10-15 MED ORDER — MIDAZOLAM HCL 2 MG/2ML IJ SOLN
INTRAMUSCULAR | Status: DC | PRN
  Administered 2021-10-15: 1 mg via INTRAVENOUS

## 2021-10-15 MED ORDER — ONDANSETRON HCL 4 MG/2ML IJ SOLN: 4.0000 mg | Freq: Once | INTRAMUSCULAR | Status: DC | PRN

## 2021-10-15 MED ORDER — ACETAMINOPHEN 500 MG PO TABS
1000.0000 mg | ORAL_TABLET | Freq: Once | ORAL | Status: AC | PRN
  Administered 2021-10-15: 1000 mg via ORAL

## 2021-10-15 MED ORDER — FENTANYL CITRATE (PF) 100 MCG/2ML IJ SOLN
INTRAMUSCULAR | Status: DC | PRN
  Administered 2021-10-15 (×2): 50 ug via INTRAVENOUS

## 2021-10-15 MED ORDER — ACETAMINOPHEN 160 MG/5ML PO SOLN: 975.0000 mg | Freq: Once | ORAL | Status: AC | PRN

## 2021-10-15 MED ORDER — SIGHTPATH DOSE#1 BSS IO SOLN
INTRAOCULAR | Status: DC | PRN
  Administered 2021-10-15: 15 mL

## 2021-10-15 MED ORDER — ONDANSETRON HCL 4 MG/2ML IJ SOLN
INTRAMUSCULAR | Status: DC | PRN
  Administered 2021-10-15: 4 mg via INTRAVENOUS

## 2021-10-15 MED ORDER — CYCLOPENTOLATE HCL 2 % OP SOLN
1.0000 [drp] | OPHTHALMIC | Status: AC
  Administered 2021-10-15 (×3): 1 [drp] via OPHTHALMIC

## 2021-10-15 MED ORDER — LACTATED RINGERS IV SOLN: INTRAVENOUS | Status: DC

## 2021-10-15 MED ORDER — LIDOCAINE HCL (PF) 2 % IJ SOLN
INTRAOCULAR | Status: DC | PRN
  Administered 2021-10-15: 1 mL via INTRAOCULAR

## 2021-10-15 MED ORDER — MOXIFLOXACIN HCL 0.5 % OP SOLN
OPHTHALMIC | Status: DC | PRN
  Administered 2021-10-15: 0.2 mL via OPHTHALMIC

## 2021-10-15 MED ORDER — TRIAMCINOLONE ACETONIDE 40 MG/ML IJ SUSP
INTRAMUSCULAR | Status: DC | PRN
  Administered 2021-10-15: 40 mg

## 2021-10-15 MED ORDER — PHENYLEPHRINE HCL 10 % OP SOLN
1.0000 [drp] | OPHTHALMIC | Status: AC | PRN
  Administered 2021-10-15 (×3): 1 [drp] via OPHTHALMIC

## 2021-10-15 MED ORDER — SIGHTPATH DOSE#1 BSS IO SOLN
INTRAOCULAR | Status: DC | PRN
  Administered 2021-10-15: 20 mL via OPHTHALMIC

## 2021-10-15 SURGICAL SUPPLY — 24 items
CANNULA ANT/CHMB 27G (MISCELLANEOUS) ×1 IMPLANT
CANNULA ANT/CHMB 27GA (MISCELLANEOUS) ×4 IMPLANT
DISSECTOR HYDRO NUCLEUS 50X22 (MISCELLANEOUS) ×2 IMPLANT
GLOVE SURG GAMMEX PI TX LF 7.5 (GLOVE) ×2 IMPLANT
GLOVE SURG SYN 8.5  E (GLOVE) ×1
GLOVE SURG SYN 8.5 E (GLOVE) ×1 IMPLANT
GLOVE SURG SYN 8.5 PF PI (GLOVE) ×1 IMPLANT
GOWN STRL REUS W/ TWL LRG LVL3 (GOWN DISPOSABLE) ×2 IMPLANT
GOWN STRL REUS W/TWL LRG LVL3 (GOWN DISPOSABLE) ×4
MARKER SKIN DUAL TIP RULER LAB (MISCELLANEOUS) ×2 IMPLANT
NDL FILTER BLUNT 18X1 1/2 (NEEDLE) ×1 IMPLANT
NEEDLE FILTER BLUNT 18X 1/2SAF (NEEDLE) ×1
NEEDLE FILTER BLUNT 18X1 1/2 (NEEDLE) ×1 IMPLANT
PACK EYE AFTER SURG (MISCELLANEOUS) ×2 IMPLANT
RING CAPSULAR 14C RIGHT (Ring) ×1 IMPLANT
STENT OPTH STRL GLAUCOMA ×1 IMPLANT
SUT ETHILON 10-0 CS-B-6CS-B-6 (SUTURE) ×2
SUTURE EHLN 10-0 CS-B-6CS-B-6 (SUTURE) IMPLANT
SYR 3ML LL SCALE MARK (SYRINGE) ×2 IMPLANT
SYR 5ML LL (SYRINGE) ×2 IMPLANT
SYR TB 1ML LUER SLIP (SYRINGE) ×3 IMPLANT
TIP IRRIGATON/ASPIRATION (MISCELLANEOUS) ×1 IMPLANT
WATER STERILE IRR 250ML POUR (IV SOLUTION) ×2 IMPLANT
WIPE NON LINTING 3.25X3.25 (MISCELLANEOUS) ×2 IMPLANT

## 2021-10-15 NOTE — H&P (Signed)
[facility]   Primary Care Physician:  Glean Hess, MD Ophthalmologist: Dr. Benay Pillow  Pre-Procedure History & Physical: HPI:  Cathy Murphy is a 80 y.o. female here for cataract surgery + microstent.   Past Medical History:  Diagnosis Date   Allergy    Anxiety    COVID-19 09/08/2021   Fever, chills, sneezing, runny nose.  Resolved.   Diabetes mellitus without complication ([place])    Glaucoma    Hyperlipidemia    Hypertension     Past Surgical History:  Procedure Laterality Date   ABDOMINAL HYSTERECTOMY     CATARACT EXTRACTION W/PHACO Left 10/01/2021   Procedure: CATARACT EXTRACTION PHACO AND INTRAOCULAR LENS PLACEMENT ([place]) LEFT HYDRUS MICROSTENT DIABETIC;  Surgeon: Eulogio Bear, MD;  Location: [place];  Service: Ophthalmology;  Laterality: Left;  Diabetic 7.49 00:42.1   COLONOSCOPY WITH PROPOFOL N/A 11/25/2017   Procedure: COLONOSCOPY WITH PROPOFOL;  Surgeon: [name], MD;  [facility] ENDOSCOPY;  Service: Endoscopy;  Laterality: N/A;   LIPOMA RESECTION     right posterior neck    Prior to Admission medications   Medication Sig Start Date End Date Taking? Authorizing Provider  cholecalciferol (VITAMIN D) 1000 units tablet Take 2,000 Units by mouth daily.   Yes [provider]  ezetimibe (ZETIA) 10 MG tablet Take 10 mg by mouth daily. 06/09/19  Yes [provider]  gabapentin (NEURONTIN) 100 MG capsule Take by mouth. 06/26/21  Yes [provider]  glipiZIDE (GLUCOTROL) 5 MG tablet TAKE 1 TABLET BY MOUTH TWICE A DAY 10/07/21  Yes Glean Hess, MD  hydrOXYzine (ATARAX/VISTARIL) 25 MG tablet Take 0.5-1 tablets (12.5-25 mg total) by mouth every 8 (eight) hours as needed for anxiety. 04/17/21  Yes Glean Hess, MD  JANUVIA 100 MG tablet TAKE 1 TABLET BY MOUTH EVERY DAY 08/20/21  Yes Glean Hess, MD  LEVEMIR FLEXTOUCH 100 UNIT/ML FlexTouch Pen INJECT 30 UNITS INTO THE SKIN DAILY. 07/19/21  Yes  Glean Hess, MD  lisinopril (ZESTRIL) 40 MG tablet TAKE 1 TABLET BY MOUTH EVERY DAY 10/07/21  Yes Glean Hess, MD  LUMIGAN 0.01 % SOLN TAKE 1 DROP(S) IN BOTH EYES ONCE IN THE EVENING 03/01/15  Yes [provider]  pioglitazone (ACTOS) 15 MG tablet TAKE 1 TABLET BY MOUTH EVERY DAY 05/14/21  Yes Glean Hess, MD  rosuvastatin (CRESTOR) 40 MG tablet TAKE 1 TABLET BY MOUTH EVERY DAY 07/01/21  Yes Glean Hess, MD  [facility] 1-0.2 % SUSP Apply 1 drop to eye 2 (two) times daily. 05/14/21  Yes [provider]  spironolactone (ALDACTONE) 25 MG tablet Take 1 tablet (25 mg total) by mouth daily. 04/17/21  Yes Glean Hess, MD  traMADol (ULTRAM) 50 MG tablet Take 1 tablet (50 mg total) by mouth every 6 (six) hours as needed. 10/11/21  Yes Margarette Canada, NP  traZODone (DESYREL) 50 MG tablet TAKE 1 TABLET BY MOUTH EVERYDAY AT BEDTIME 02/05/21  Yes Glean Hess, MD  B-D UF III MINI PEN NEEDLES 31G X 5 MM MISC USE AS DIRECTED WITH LEVEMIR 09/25/20   Glean Hess, MD  glucose blood test strip Use to test blood sugar up to twice daily. 10/08/19   Glean Hess, MD  albuterol [facility] HFA) [phone] Base) MCG/ACT inhaler Inhale 2 puffs into the lungs every 6 (six) hours as needed for wheezing or shortness of breath. 02/18/19 12/30/19  Glean Hess, MD    Allergies  as of 08/13/2021 - Review Complete 07/16/2021  Allergen Reaction Noted   Amoxicillin Diarrhea 06/15/2015   Aricept [donepezil] Other (See Comments) 02/14/2021   Aspirin Nausea And Vomiting 06/15/2015   Metformin and related Diarrhea 06/16/2015   Oxycodone Other (See Comments) 04/12/2016   Sulfa antibiotics  04/14/2015    Family History  Problem Relation Age of Onset   Cancer Mother        Breast   Breast cancer Mother    Heart disease Father    Heart attack Brother    Cancer Maternal Aunt        breast   Breast cancer Maternal Aunt    Breast cancer Cousin        mat cousin    Social  History   Socioeconomic History   Marital status: Widowed    Spouse name: Not on file   Number of children: 1   Years of education: Not on file   Highest education level: 12th grade  Occupational History    Employer: [employer]  Tobacco Use   Smoking status: Former    Packs/day: 1.00    Years: 7.00    Pack years: 7.00    Types: Cigarettes    Quit date: 1965    Years since quitting: 57.9   Smokeless tobacco: Never   Tobacco comments:    smoking cessation materials not required  Vaping Use   Vaping Use: Never used  Substance and Sexual Activity   Alcohol use: No   Drug use: No   Sexual activity: Never  Other Topics Concern   Not on file  Social History Narrative   Pt lives alone.   Social Determinants of Health   Financial Resource Strain: Low Risk    Difficulty of Paying Living Expenses: Not hard at all  Food Insecurity: No Food Insecurity   Worried About Charity fundraiser in the Last Year: Never true   [place] in the Last Year: Never true  Transportation Needs: No Transportation Needs   Lack of Transportation (Medical): No   Lack of Transportation (Non-Medical): No  Physical Activity: Inactive   Days of Exercise per Week: 0 days   Minutes of Exercise per Session: 0 min  Stress: Stress Concern Present   Feeling of Stress : To some extent  Social Connections: Moderately Isolated   Frequency of Communication with Friends and Family: Not on file   Frequency of Social Gatherings with Friends and Family: Three times a week   Attends Religious Services: More than 4 times per year   Active Member of Clubs or Organizations: No   Attends Archivist Meetings: Never   Marital Status: Widowed  Human resources officer Violence: Not At Risk   Fear of Current or Ex-Partner: No   Emotionally Abused: No   Physically Abused: No   Sexually Abused: No    Review of Systems: See HPI, otherwise negative ROS  Physical Exam: BP 135/74   Pulse 81   Temp (!) 97.5 F  (36.4 C) (Temporal)   Resp 10   Ht 5\' 9"  (1.753 m)   Wt 80.7 kg   SpO2 99%   BMI 26.29 kg/m  General:   Alert, cooperative in NAD Head:  Normocephalic and atraumatic. Respiratory:  Normal work of breathing. Cardiovascular:  RRR  Impression/Plan: TIERSA DAYLEY is here for cataract surgery.  Risks, benefits, limitations, and alternatives regarding cataract surgery + microstent have been reviewed with the patient.  Questions have been answered.  All parties agreeable.   Benay Pillow, MD  10/15/2021, 9:38 AM

## 2021-10-15 NOTE — Anesthesia Preprocedure Evaluation (Signed)
Anesthesia Evaluation  Patient identified by MRN, date of birth, ID band Patient awake    Reviewed: Allergy & Precautions, NPO status   Airway Mallampati: II  TM Distance: >3 FB     Dental   Pulmonary former smoker,    Pulmonary exam normal        Cardiovascular hypertension, + CAD   Rhythm:Regular Rate:Normal  HLD   Neuro/Psych  Headaches, Anxiety    GI/Hepatic   Endo/Other  diabetes, Type 2  Renal/GU      Musculoskeletal  (+) Arthritis ,   Abdominal   Peds  Hematology   Anesthesia Other Findings   Reproductive/Obstetrics                             Anesthesia Physical  Anesthesia Plan  ASA: 3  Anesthesia Plan: MAC   Post-op Pain Management:    Induction: Intravenous  PONV Risk Score and Plan: TIVA, Midazolam and Treatment may vary due to age or medical condition  Airway Management Planned: Natural Airway and Nasal Cannula  Additional Equipment:   Intra-op Plan:   Post-operative Plan:   Informed Consent: I have reviewed the patients History and Physical, chart, labs and discussed the procedure including the risks, benefits and alternatives for the proposed anesthesia with the patient or authorized representative who has indicated his/her understanding and acceptance.       Plan Discussed with: CRNA  Anesthesia Plan Comments:         Anesthesia Quick Evaluation

## 2021-10-15 NOTE — Transfer of Care (Signed)
Immediate Anesthesia Transfer of Care Note  Patient: Cathy Murphy  Procedure(s) Performed: CATARACT EXTRACTION PHACO AND INTRAOCULAR LENS PLACEMENT ([facility]) RIGHT HYDRUS MICROSTENT DIABETIC (Right: Eye)  Patient Location: PACU  Anesthesia Type: MAC  Level of Consciousness: awake, alert  and patient cooperative  Airway and Oxygen Therapy: Patient Spontanous Breathing and Patient connected to supplemental oxygen  Post-op Assessment: Post-op Vital signs reviewed, Patient's Cardiovascular Status Stable, Respiratory Function Stable, Patent Airway and No signs of Nausea or vomiting  Post-op Vital Signs: Reviewed and stable  Complications: No notable events documented.

## 2021-10-15 NOTE — Op Note (Signed)
OPERATIVE NOTE  Cathy Murphy [id number] 10/15/2021  PREOPERATIVE DIAGNOSIS:   1.  Moderate PRIMARY open angle glaucoma, right eye.  Y07.3710 2.  Nuclear sclerotic cataract right eye.  H25.11   POSTOPERATIVE DIAGNOSIS:    same.   PROCEDURE:   1.  Placement of trabecular bypass stent (hydrus) and partial phacoemusification, NO lens implant right eye  CPT [phone]   LENS: Implant Name Type Inv. Item Serial No. Manufacturer Lot No. LRB No. Used Action  STENT OPTH STRL GLAUCOMA - [id number]  STENT OPTH STRL GLAUCOMA  [employer] [id number] Right 1 Implanted  LENS IOL TECNIS EYHANCE 21.5 - [id number] Intraocular Lens LENS IOL TECNIS EYHANCE 21.5 [id number] [name]   Right 1 Implanted  eyejet CTR   [id number] MORCHER [employer] CBCBJA Right 1 Implanted      Procedure(s) with comments: CATARACT EXTRACTION PHACO AND INTRAOCULAR LENS PLACEMENT ([facility]) RIGHT HYDRUS MICROSTENT DIABETIC (Right) - Diabetic 4.98 00:27.3  No lens implant   ULTRASOUND TIME: 0 minutes 27 seconds.  CDE 4.98   SURGEON:  Cathy Pillow, MD, MPH  ANESTHESIOLOGIST: Anesthesiologist: April Manson, MD CRNA: Silvana Newness, CRNA   ANESTHESIA:  MAC and intracameral preservative-free lidocaine 4%.  ESTIMATED BLOOD LOSS: less than 1 mL.   COMPLICATIONS:  zonular dialysis, possible posterior capsular rupture.  Lens fragments lost to the posterior segment.  Capsular tension ring placed, but no lens implanted.   DESCRIPTION OF PROCEDURE:  The patient was identified in the holding room and transported to the operating room.   The patient was placed in the supine position under the operating microscope.  The right eye was prepped and draped in the usual sterile ophthalmic fashion.   A 1.0 millimeter clear-corneal paracentesis was made at the 10:30 position and a second paracentesis at 7:00.  0.5 ml of preservative-free 1% lidocaine with epinephrine was injected into the anterior chamber.  The anterior chamber was filled with Healon  5 viscoelastic.  A 2.4 millimeter keratome was used to make a near-clear corneal incision at the 8:00 position.   Attention was turned to the hydrus.  The patients head was turned to the left and the microscope was tilted to 035 degrees.  Ocular instruments/Glaukos OAL/H2 gonioprism was used coupled with Healon 5 on the cornea was used to visualize the trabecular meshwork. The hydrus was opened and introduced into the eye.  The meshwork was engaged with the tip of the injector and the hydrus stent was deployed into Schlemm's canal at 4:00.  The stent was well seated and in good position.  The stent placement was performed without any significant difficulty.  The anterior chamber was maintained with Healon 5 but not overinflated.  Next, attention was turned to the phacoemulsification A curvilinear capsulorrhexis was made with a cystotome and capsulorrhexis forceps.  Balanced salt solution was used to hydrodissect and hydrodelineate the nucleus.   Phacoemulsification was then used in stop and chop fashion to remove the lens nucleus and epinucleus.   There was some zonular weakness noted with rotating the lens but there was no capsular compromised detected.  A capsular tension ring was placed but the lens/capsular complex continued to shift posteriorly and the placement of the ring did not bring adequate support to proceed with phacoemulsification.    Kenalog was injected into the eye and there was no vitreous in the anterior chamber.   A 10-0 nylon was placed through the main wound.  Intracameral vigamox 0.1 mL undiluted was injected into the eye and a drop placed  onto the ocular surface.   Wounds were hydrated with balanced salt solution.  The anterior chamber was inflated to a physiologic pressure with balanced salt solution.   No wound leaks were noted.  Maxitrol ointment and a patch was placed over the right eye.  The complications and need for additional surgery were disclosed to the patient.    The patient was taken to the recovery room in stable condition.   Cathy Murphy 10/15/2021, 10:34 AM

## 2021-10-15 NOTE — Anesthesia Postprocedure Evaluation (Signed)
Anesthesia Post Note  Patient: Cathy Murphy  Procedure(s) Performed: PHACO EMULSIFICATION  with HYDRUS MICROSTENT iMPLANTATION (Right: Eye)     Patient location during evaluation: PACU Anesthesia Type: MAC Level of consciousness: awake and alert Pain management: pain level controlled Vital Signs Assessment: post-procedure vital signs reviewed and stable Respiratory status: spontaneous breathing, nonlabored ventilation and respiratory function stable Cardiovascular status: stable and blood pressure returned to baseline Postop Assessment: no apparent nausea or vomiting Anesthetic complications: no   No notable events documented.  April Manson

## 2021-10-16 ENCOUNTER — Encounter: Payer: Self-pay | Admitting: Ophthalmology

## 2021-10-16 DIAGNOSIS — I700 Atherosclerosis of aorta: Secondary | ICD-10-CM | POA: Diagnosis not present

## 2021-10-16 DIAGNOSIS — I25118 Atherosclerotic heart disease of native coronary artery with other forms of angina pectoris: Secondary | ICD-10-CM | POA: Diagnosis not present

## 2021-10-16 DIAGNOSIS — I10 Essential (primary) hypertension: Secondary | ICD-10-CM | POA: Diagnosis not present

## 2021-10-16 DIAGNOSIS — E1169 Type 2 diabetes mellitus with other specified complication: Secondary | ICD-10-CM | POA: Diagnosis not present

## 2021-10-16 DIAGNOSIS — E785 Hyperlipidemia, unspecified: Secondary | ICD-10-CM | POA: Diagnosis not present

## 2021-10-16 DIAGNOSIS — R55 Syncope and collapse: Secondary | ICD-10-CM | POA: Diagnosis not present

## 2021-10-17 DIAGNOSIS — J45909 Unspecified asthma, uncomplicated: Secondary | ICD-10-CM | POA: Diagnosis not present

## 2021-10-17 DIAGNOSIS — H2701 Aphakia, right eye: Secondary | ICD-10-CM | POA: Diagnosis not present

## 2021-10-17 DIAGNOSIS — Z794 Long term (current) use of insulin: Secondary | ICD-10-CM | POA: Diagnosis not present

## 2021-10-17 DIAGNOSIS — H59021 Cataract (lens) fragments in eye following cataract surgery, right eye: Secondary | ICD-10-CM | POA: Diagnosis not present

## 2021-10-17 DIAGNOSIS — K089 Disorder of teeth and supporting structures, unspecified: Secondary | ICD-10-CM | POA: Diagnosis not present

## 2021-10-17 DIAGNOSIS — Z880 Allergy status to penicillin: Secondary | ICD-10-CM | POA: Diagnosis not present

## 2021-10-17 DIAGNOSIS — Z8679 Personal history of other diseases of the circulatory system: Secondary | ICD-10-CM | POA: Diagnosis not present

## 2021-10-17 DIAGNOSIS — M1990 Unspecified osteoarthritis, unspecified site: Secondary | ICD-10-CM | POA: Diagnosis not present

## 2021-10-17 DIAGNOSIS — H4301 Vitreous prolapse, right eye: Secondary | ICD-10-CM | POA: Diagnosis not present

## 2021-10-17 DIAGNOSIS — Z79899 Other long term (current) drug therapy: Secondary | ICD-10-CM | POA: Diagnosis not present

## 2021-10-17 DIAGNOSIS — I10 Essential (primary) hypertension: Secondary | ICD-10-CM | POA: Diagnosis not present

## 2021-10-17 DIAGNOSIS — Z885 Allergy status to narcotic agent status: Secondary | ICD-10-CM | POA: Diagnosis not present

## 2021-10-17 DIAGNOSIS — Z87891 Personal history of nicotine dependence: Secondary | ICD-10-CM | POA: Diagnosis not present

## 2021-10-17 DIAGNOSIS — Z7984 Long term (current) use of oral hypoglycemic drugs: Secondary | ICD-10-CM | POA: Diagnosis not present

## 2021-10-17 DIAGNOSIS — H338 Other retinal detachments: Secondary | ICD-10-CM | POA: Diagnosis not present

## 2021-10-17 DIAGNOSIS — Z886 Allergy status to analgesic agent status: Secondary | ICD-10-CM | POA: Diagnosis not present

## 2021-10-17 DIAGNOSIS — E119 Type 2 diabetes mellitus without complications: Secondary | ICD-10-CM | POA: Diagnosis not present

## 2021-10-18 DIAGNOSIS — H59021 Cataract (lens) fragments in eye following cataract surgery, right eye: Secondary | ICD-10-CM | POA: Diagnosis not present

## 2021-10-18 DIAGNOSIS — E1169 Type 2 diabetes mellitus with other specified complication: Secondary | ICD-10-CM | POA: Diagnosis not present

## 2021-10-18 DIAGNOSIS — H33001 Unspecified retinal detachment with retinal break, right eye: Secondary | ICD-10-CM | POA: Diagnosis not present

## 2021-10-19 ENCOUNTER — Other Ambulatory Visit: Payer: Self-pay

## 2021-10-19 ENCOUNTER — Ambulatory Visit: Payer: Medicare Other

## 2021-10-19 ENCOUNTER — Ambulatory Visit
Admission: EM | Admit: 2021-10-19 | Discharge: 2021-10-19 | Disposition: A | Discharge status: Home / Self Care | Payer: Medicare Other | Attending: Emergency Medicine | Admitting: Emergency Medicine

## 2021-10-19 ENCOUNTER — Encounter: Payer: Self-pay | Admitting: Emergency Medicine

## 2021-10-19 ENCOUNTER — Ambulatory Visit (INDEPENDENT_AMBULATORY_CARE_PROVIDER_SITE_OTHER): Payer: Medicare Other

## 2021-10-19 DIAGNOSIS — K5900 Constipation, unspecified: Secondary | ICD-10-CM | POA: Diagnosis not present

## 2021-10-19 DIAGNOSIS — R109 Unspecified abdominal pain: Secondary | ICD-10-CM | POA: Diagnosis not present

## 2021-10-19 NOTE — ED Provider Notes (Signed)
[facility]    CSN: [id number] Arrival date & time: 10/19/21  1238      History   Chief Complaint Chief Complaint  Patient presents with   Constipation    HPI Cathy Murphy is a 80 y.o. female.   HPI  80 year old female here for evaluation of constipation.  Patient reports that she is unsure when her last bowel movement was.  She states that she tried to have a bowel movement yesterday but she could not get the stool to come out and now she is experiencing pain in her rectum.  She denies any abdominal pain, bloating, nausea, vomiting, or blood in her rectum.  Past Medical History:  Diagnosis Date   Allergy    Anxiety    COVID-19 09/08/2021   Fever, chills, sneezing, runny nose.  Resolved.   Diabetes mellitus without complication ([place])    Glaucoma    Hyperlipidemia    Hypertension     Patient Active Problem List   Diagnosis Date Noted   Difficulty sleeping 12/29/2020   Mild cognitive impairment 12/29/2020   Headache disorder 12/29/2020   Stage 3a chronic kidney disease ([place]) 08/16/2020   Osteopenia determined by x-ray 08/05/2019   Atherosclerosis of abdominal aorta ([place]) 06/09/2019   Coronary artery disease involving native coronary artery of native heart 06/09/2019   BMI 24.0-24.9, adult 11/06/2018   Bilateral neck pain 10/08/2018   Aortic calcification ([place]) 03/05/2018   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   Ventral hernia without obstruction or gangrene 03/06/2016   Type II diabetes mellitus with complication ([place]) 22/12/5425   Plantar fasciitis, right 06/16/2015   Knee torn cartilage 04/26/2015   Allergic rhinitis, seasonal 04/26/2015   Essential (primary) hypertension 04/26/2015   Glaucoma 04/26/2015   Hyperlipidemia associated with type 2 diabetes mellitus ([place]) 04/26/2015   Insomnia 04/26/2015    Past Surgical History:  Procedure Laterality Date   ABDOMINAL HYSTERECTOMY     CATARACT EXTRACTION W/PHACO Left 10/01/2021   Procedure:  CATARACT EXTRACTION PHACO AND INTRAOCULAR LENS PLACEMENT ([place]) LEFT HYDRUS MICROSTENT DIABETIC;  Surgeon: Eulogio Bear, MD;  Location: [place];  Service: Ophthalmology;  Laterality: Left;  Diabetic 7.49 00:42.1   CATARACT EXTRACTION W/PHACO Right 10/15/2021   Procedure: PHACO EMULSIFICATION  with HYDRUS MICROSTENT iMPLANTATION;  Surgeon: Eulogio Bear, MD;  Location: [place];  Service: Ophthalmology;  Laterality: Right;  Diabetic 4.98 00:27.3   COLONOSCOPY WITH PROPOFOL N/A 11/25/2017   Procedure: COLONOSCOPY WITH PROPOFOL;  Surgeon: [name], MD;  Location: [facility] ENDOSCOPY;  Service: Endoscopy;  Laterality: N/A;   EYE SURGERY     LIPOMA RESECTION     right posterior neck    OB History   No obstetric history on file.      Home Medications    Prior to Admission medications   Medication Sig Start Date End Date Taking? Authorizing Provider  B-D UF III MINI PEN NEEDLES 31G X 5 MM MISC USE AS DIRECTED WITH LEVEMIR 09/25/20   Glean Hess, MD  cholecalciferol (VITAMIN D) 1000 units tablet Take 2,000 Units by mouth daily.    [provider]  ezetimibe (ZETIA) 10 MG tablet Take 10 mg by mouth daily. 06/09/19   [provider]  gabapentin (NEURONTIN) 100 MG capsule Take by mouth. 06/26/21   [provider]  glipiZIDE (GLUCOTROL) 5 MG tablet TAKE 1 TABLET BY MOUTH TWICE A DAY 10/07/21   Glean Hess, MD  glucose blood test strip Use to test  blood sugar up to twice daily. 10/08/19   Glean Hess, MD  hydrOXYzine (ATARAX/VISTARIL) 25 MG tablet Take 0.5-1 tablets (12.5-25 mg total) by mouth every 8 (eight) hours as needed for anxiety. 04/17/21   Glean Hess, MD  JANUVIA 100 MG tablet TAKE 1 TABLET BY MOUTH EVERY DAY 08/20/21   Glean Hess, MD  LEVEMIR FLEXTOUCH 100 UNIT/ML FlexTouch Pen INJECT 30 UNITS INTO THE SKIN DAILY. 07/19/21   Glean Hess, MD  lisinopril (ZESTRIL) 40 MG tablet TAKE 1 TABLET BY  MOUTH EVERY DAY 10/07/21   Glean Hess, MD  LUMIGAN 0.01 % SOLN TAKE 1 DROP(S) IN BOTH EYES ONCE IN THE EVENING 03/01/15   [provider]  pioglitazone (ACTOS) 15 MG tablet TAKE 1 TABLET BY MOUTH EVERY DAY 05/14/21   Glean Hess, MD  rosuvastatin (CRESTOR) 40 MG tablet TAKE 1 TABLET BY MOUTH EVERY DAY 07/01/21   Glean Hess, MD  SIMBRINZA 1-0.2 % SUSP Apply 1 drop to eye 2 (two) times daily. 05/14/21   [provider]  spironolactone (ALDACTONE) 25 MG tablet Take 1 tablet (25 mg total) by mouth daily. 04/17/21   Glean Hess, MD  traMADol (ULTRAM) 50 MG tablet Take 1 tablet (50 mg total) by mouth every 6 (six) hours as needed. 10/11/21   Margarette Canada, NP  traZODone (DESYREL) 50 MG tablet TAKE 1 TABLET BY MOUTH EVERYDAY AT BEDTIME 02/05/21   Glean Hess, MD  albuterol [facility] HFA) [phone] Base) MCG/ACT inhaler Inhale 2 puffs into the lungs every 6 (six) hours as needed for wheezing or shortness of breath. 02/18/19 12/30/19  Glean Hess, MD    Family History Family History  Problem Relation Age of Onset   Cancer Mother        Breast   Breast cancer Mother    Heart disease Father    Heart attack Brother    Cancer Maternal Aunt        breast   Breast cancer Maternal Aunt    Breast cancer Cousin        mat cousin    Social History Social History   Tobacco Use   Smoking status: Former    Packs/day: 1.00    Years: 7.00    Pack years: 7.00    Types: Cigarettes    Quit date: 1965    Years since quitting: 57.9   Smokeless tobacco: Never   Tobacco comments:    smoking cessation materials not required  Vaping Use   Vaping Use: Never used  Substance Use Topics   Alcohol use: No   Drug use: No     Allergies   Alprazolam, Amoxicillin, Aricept [donepezil], Aspirin, Metformin and related, Oxycodone, Riomet [metformin hcl], Sulfa antibiotics, Hydrocodone, Nabumetone, and Skelaxin [metaxalone]   Review of Systems Review of Systems   Constitutional:  Negative for activity change, appetite change and fever.  Gastrointestinal:  Positive for constipation and rectal pain. Negative for abdominal pain, anal bleeding, blood in stool, nausea and vomiting.  Skin:  Negative for rash.  Hematological: Negative.   Psychiatric/Behavioral: Negative.      Physical Exam Triage Vital Signs ED Triage Vitals  Enc Vitals Group     BP 10/19/21 1327 129/70     Pulse Rate 10/19/21 1327 98     Resp 10/19/21 1327 14     Temp 10/19/21 1327 98.3 F (36.8 C)     Temp Source 10/19/21 1327 Oral  SpO2 10/19/21 1327 99 %     Weight 10/19/21 1324 177 lb 14.6 oz (80.7 kg)     Height 10/19/21 1324 5\' 9"  (1.753 m)     Head Circumference --      Peak Flow --      Pain Score 10/19/21 1324 7     Pain Loc --      Pain Edu? --      Excl. in [place]? --    No data found.  Updated Vital Signs BP 129/70 (BP Location: Right Arm)    Pulse 98    Temp 98.3 F (36.8 C) (Oral)    Resp 14    Ht 5\' 9"  (1.753 m)    Wt 177 lb 14.6 oz (80.7 kg)    SpO2 99%    BMI 26.27 kg/m   Visual Acuity Right Eye Distance:   Left Eye Distance:   Bilateral Distance:    Right Eye Near:   Left Eye Near:    Bilateral Near:     Physical Exam Vitals and nursing note reviewed. Exam conducted with a chaperone present Nira Conn, RN).  Constitutional:      General: She is in acute distress.  HENT:     Head: Normocephalic and atraumatic.  Cardiovascular:     Rate and Rhythm: Normal rate and regular rhythm.     Pulses: Normal pulses.     Heart sounds: Normal heart sounds. No murmur heard.   No gallop.  Pulmonary:     Effort: Pulmonary effort is normal.     Breath sounds: Normal breath sounds. No wheezing, rhonchi or rales.  Abdominal:     General: Abdomen is flat.     Palpations: Abdomen is soft.     Tenderness: There is no abdominal tenderness. There is no guarding or rebound.  Genitourinary:    Rectum: Normal.  Skin:    General: Skin is warm and dry.      Capillary Refill: Capillary refill takes less than 2 seconds.     Findings: No erythema or rash.  Neurological:     General: No focal deficit present.     Mental Status: She is alert and oriented to person, place, and time.  Psychiatric:        Mood and Affect: Mood normal.        Behavior: Behavior normal.        Thought Content: Thought content normal.        Judgment: Judgment normal.     UC Treatments / Results  Labs (all labs ordered are listed, but only abnormal results are displayed) Labs Reviewed - No data to display  EKG   Radiology DG Abd 2 Views  Result Date: 10/19/2021 CLINICAL DATA:  Rectal pain with difficulty having bowel movement yesterday. EXAM: ABDOMEN - 2 VIEW COMPARISON:  Radiographs 04/02/2012. FINDINGS: The bowel gas pattern is nonobstructive. There is no evidence of free intraperitoneal air or bowel wall thickening. A moderate amount of stool is present throughout the colon. There are postsurgical changes in the pelvis. Scattered pelvic calcifications are similar to previous study, likely phleboliths. No acute osseous findings are evident. IMPRESSION: Prominent stool throughout the colon suggesting constipation. No evidence of bowel obstruction or acute process. Electronically Signed   By: Richardean Sale M.D.   On: 10/19/2021 14:52    Procedures Procedures (including critical care time)  Medications Ordered in UC Medications - No data to display  Initial Impression / Assessment and Plan / UC Course  I have reviewed the triage vital signs and the nursing notes.  Pertinent labs & imaging results that were available during my care of the patient were reviewed by me and considered in my medical decision making (see chart for details).  Patient is a pleasant 80 year old female who appears to be in a moderate degree of pain presenting for evaluation of constipation and rectal pain.  She is unsure when her last bowel movement was.  She states she started having  pain in her rectum yesterday and difficulty moving her bowels yesterday as well.  She states that she is try to have a bowel movement without any results.  She also has not tried anything at home.  She is here today in hopes of finding a way to have a bowel movement.  She has not had any abdominal pain, bloating, nausea or vomiting.  No fever.  On exam patient has benign cardiopulmonary exam with clear lung sounds all fields.  Abdomen is protuberant but soft and nontender.  Rectal exam reveals normal rectal tone and no gross blood.  Patient does have a ball of soft stool in her rectal vault.  Will obtain 2 view the abdomen to evaluate degree of constipation and to look for possible developing small bowel obstruction.  Two-view abdomen independently reviewed and evaluated by me.  Impression: There is a significant stool throughout the colon and nonspecific air-fluid levels.  No signs of obstruction.  Radiology overread is pending. Radiology impression is prominent stool throughout the colon suggestive of constipation.  No evidence of bowel obstruction or acute process.  I discussed with the patient and gave her options of either going home and using a fleets mineral oil or saline enema in conjunction with oral Dulcolax laxative tablets or going to the ER for soapsuds enema administration.  She would like to discuss with her daughter.  Her daughter is not present in the waiting room so registration staff is going to phone her and have her come back to the patient's room so we can discuss discharge options.  Patient is elected to be discharged home and try Dulcolax tablets or magnesium citrate orally in conjunction with a fleets enema to try and produce a bowel movement.  I advised her that if she is unable to produce a bowel movement using these measures, if she develops any abdominal pain, fever, or nausea and vomiting that she needs to go to the ER.  Patient's daughter did mention that she has had anesthesia  twice this week for eye surgery which may be contributing to her constipation.   Final Clinical Impressions(s) / UC Diagnoses   Final diagnoses:  Constipation  Constipation, unspecified constipation type     Discharge Instructions      Take either 2 Dulcolax tablets or half a bottle of magnesium citrate by mouth to help resolve your constipation.  Instill a fleets mineral oil enema in your rectum and hold it for as long as possible to also help resolve the constipation.  If you are unable to have a bowel movement after using the oral laxatives and enemas then you need to go to the ER for evaluation.  Similarly, if you develop any fever, abdominal pain, nausea or vomiting you need to go to the ER for evaluation.     ED Prescriptions   None    PDMP not reviewed this encounter.   Margarette Canada, NP 10/19/21 1527

## 2021-10-19 NOTE — ED Triage Notes (Signed)
Patient states that she is unsure when her last bowel movement was.  Patient reports difficulty having bowel movement yesterday.  Patient reports pain in her rectum.

## 2021-10-19 NOTE — Discharge Instructions (Addendum)
Take either 2 Dulcolax tablets or half a bottle of magnesium citrate by mouth to help resolve your constipation.  Instill a fleets mineral oil enema in your rectum and hold it for as long as possible to also help resolve the constipation.  If you are unable to have a bowel movement after using the oral laxatives and enemas then you need to go to the ER for evaluation.  Similarly, if you develop any fever, abdominal pain, nausea or vomiting you need to go to the ER for evaluation.

## 2021-10-25 ENCOUNTER — Telehealth: Payer: Self-pay | Admitting: Internal Medicine

## 2021-10-25 DIAGNOSIS — E118 Type 2 diabetes mellitus with unspecified complications: Secondary | ICD-10-CM

## 2021-10-25 NOTE — Telephone Encounter (Signed)
Requested Prescriptions  Pending Prescriptions Disp Refills   LEVEMIR FLEXTOUCH 100 UNIT/ML FlexTouch Pen [Pharmacy Med Name: LEVEMIR FLEXTOUCH 100 UNIT/ML] 3 mL 1    Sig: INJECT 30 UNITS INTO THE SKIN DAILY.     Endocrinology:  Diabetes - Insulins Failed - 10/25/2021 12:18 PM      Failed - HBA1C is between 0 and 7.9 and within 180 days    Hemoglobin A1C  Date Value Ref Range Status  07/10/2021 8.1 (A) 4.0 - 5.6 % Final   HbA1c, POC (prediabetic range)  Date Value Ref Range Status  12/11/2020 0 (A) 5.7 - 6.4 % Final   HbA1c, POC (controlled diabetic range)  Date Value Ref Range Status  12/11/2020 0.0 0.0 - 7.0 % Final   HbA1c POC (<> result, manual entry)  Date Value Ref Range Status  12/11/2020 0 4.0 - 5.6 % Final   Hgb A1c MFr Bld  Date Value Ref Range Status  04/17/2021 10.6 (H) 4.8 - 5.6 % Final    Comment:             Prediabetes: 5.7 - 6.4          Diabetes: >6.4          Glycemic control for adults with diabetes: <7.0          Passed - Valid encounter within last 6 months    Recent Outpatient Visits          1 week ago Syncope, unspecified syncope type   [facility] Glean Hess, MD   1 month ago Annual physical exam   [facility] Glean Hess, MD   3 months ago Subacute vaginitis   [facility] Glean Hess, MD   6 months ago Type II diabetes mellitus with complication [facility])   [facility] Glean Hess, MD   8 months ago Dizziness   [facility] Glean Hess, MD

## 2021-10-26 MED ORDER — LEVEMIR FLEXTOUCH 100 UNIT/ML ~~LOC~~ SOPN: 30.0000 [IU] | PEN_INJECTOR | Freq: Every day | SUBCUTANEOUS | 1 refills | Status: DC

## 2021-10-26 NOTE — Telephone Encounter (Signed)
[facility] called and spoke to [place] about the Levemir requested. She says it was received on 10/25/21, but with the 3 ml sent they are not able to break open a box, so it will need to be resent for 15 ml (1 box).

## 2021-10-26 NOTE — Addendum Note (Signed)
Addended by: Matilde Sprang on: 10/26/2021 02:58 PM   Modules accepted: Orders

## 2021-10-26 NOTE — Telephone Encounter (Signed)
Pt is calling to check on the status of her medication.

## 2021-11-12 ENCOUNTER — Other Ambulatory Visit: Payer: Self-pay | Admitting: Internal Medicine

## 2021-11-12 DIAGNOSIS — E119 Type 2 diabetes mellitus without complications: Secondary | ICD-10-CM

## 2021-11-12 DIAGNOSIS — Z794 Long term (current) use of insulin: Secondary | ICD-10-CM

## 2021-11-12 NOTE — Telephone Encounter (Signed)
Requested Prescriptions  Pending Prescriptions Disp Refills   traZODone (DESYREL) 50 MG tablet [Pharmacy Med Name: TRAZODONE 50 MG TABLET] 90 tablet 0    Sig: TAKE 1 TABLET BY MOUTH EVERYDAY AT BEDTIME     Psychiatry: Antidepressants - Serotonin Modulator Passed - 11/12/2021 10:07 AM      Passed - Valid encounter within last 6 months    Recent Outpatient Visits          1 month ago Syncope, unspecified syncope type   [facility] Glean Hess, MD   2 months ago Annual physical exam   [facility] Glean Hess, MD   4 months ago Subacute vaginitis   [facility] Glean Hess, MD   6 months ago Type II diabetes mellitus with complication [facility])   [facility] Glean Hess, MD   9 months ago Dizziness   [facility] Glean Hess, MD              pioglitazone (ACTOS) 15 MG tablet [Pharmacy Med Name: PIOGLITAZONE HCL 15 MG TABLET] 90 tablet 1    Sig: TAKE 1 TABLET BY [place]     Endocrinology:  Diabetes - Glitazones - pioglitazone Failed - 11/12/2021 10:07 AM      Failed - HBA1C is between 0 and 7.9 and within 180 days    Hemoglobin A1C  Date Value Ref Range Status  07/10/2021 8.1 (A) 4.0 - 5.6 % Final   HbA1c, POC (prediabetic range)  Date Value Ref Range Status  12/11/2020 0 (A) 5.7 - 6.4 % Final   HbA1c, POC (controlled diabetic range)  Date Value Ref Range Status  12/11/2020 0.0 0.0 - 7.0 % Final   HbA1c POC (<> result, manual entry)  Date Value Ref Range Status  12/11/2020 0 4.0 - 5.6 % Final   Hgb A1c MFr Bld  Date Value Ref Range Status  04/17/2021 10.6 (H) 4.8 - 5.6 % Final    Comment:             Prediabetes: 5.7 - 6.4          Diabetes: >6.4          Glycemic control for adults with diabetes: <7.0          Passed - Valid encounter within last 6 months    Recent Outpatient Visits          1 month ago Syncope, unspecified syncope type   [facility] Glean Hess,  MD   2 months ago Annual physical exam   [facility] Glean Hess, MD   4 months ago Subacute vaginitis   [facility] Glean Hess, MD   6 months ago Type II diabetes mellitus with complication [facility])   [facility] Glean Hess, MD   9 months ago Dizziness   [facility] Glean Hess, MD

## 2021-11-12 NOTE — Telephone Encounter (Signed)
Requested Prescriptions  Pending Prescriptions Disp Refills   pioglitazone (ACTOS) 15 MG tablet [Pharmacy Med Name: PIOGLITAZONE HCL 15 MG TABLET] 90 tablet 0    Sig: TAKE 1 TABLET BY MOUTH EVERY DAY     Endocrinology:  Diabetes - Glitazones - pioglitazone Failed - 11/12/2021 10:07 AM      Failed - HBA1C is between 0 and 7.9 and within 180 days    Hemoglobin A1C  Date Value Ref Range Status  07/10/2021 8.1 (A) 4.0 - 5.6 % Final   HbA1c, POC (prediabetic range)  Date Value Ref Range Status  12/11/2020 0 (A) 5.7 - 6.4 % Final   HbA1c, POC (controlled diabetic range)  Date Value Ref Range Status  12/11/2020 0.0 0.0 - 7.0 % Final   HbA1c POC (<> result, manual entry)  Date Value Ref Range Status  12/11/2020 0 4.0 - 5.6 % Final   Hgb A1c MFr Bld  Date Value Ref Range Status  04/17/2021 10.6 (H) 4.8 - 5.6 % Final    Comment:             Prediabetes: 5.7 - 6.4          Diabetes: >6.4          Glycemic control for adults with diabetes: <7.0          Passed - Valid encounter within last 6 months    Recent Outpatient Visits          1 month ago Syncope, unspecified syncope type   [facility] Glean Hess, MD   2 months ago Annual physical exam   [facility] Glean Hess, MD   4 months ago Subacute vaginitis   [facility] Glean Hess, MD   6 months ago Type II diabetes mellitus with complication [facility])   [facility] Glean Hess, MD   9 months ago Dizziness   [facility] Glean Hess, MD             Signed Prescriptions Disp Refills   traZODone (DESYREL) 50 MG tablet 90 tablet 0    Sig: TAKE 1 TABLET BY MOUTH EVERYDAY AT BEDTIME     Psychiatry: Antidepressants - Serotonin Modulator Passed - 11/12/2021 10:07 AM      Passed - Valid encounter within last 6 months    Recent Outpatient Visits          1 month ago Syncope, unspecified syncope type   [facility] Glean Hess, MD   2  months ago Annual physical exam   [facility] Glean Hess, MD   4 months ago Subacute vaginitis   [facility] Glean Hess, MD   6 months ago Type II diabetes mellitus with complication [facility])   [facility] Glean Hess, MD   9 months ago Dizziness   [facility] Glean Hess, MD

## 2021-11-16 ENCOUNTER — Other Ambulatory Visit: Payer: Self-pay | Admitting: Internal Medicine

## 2021-11-16 NOTE — Telephone Encounter (Signed)
Requested medication (s) are due for refill today: expired Rx  Requested medication (s) are on the active medication list: yes  Last refill:  11/22 21 #100 3 refills  Future visit scheduled: no   Notes to clinic:  expired Rx do you want to renew?     Requested Prescriptions  Pending Prescriptions Disp Refills   B-D UF III MINI PEN NEEDLES 31G X 5 MM MISC [Pharmacy Med Name: BD UF MINI PEN NEEDLE 5MMX31G] 100 each 3    Sig: USE AS DIRECTED WITH LEVEMIR     Endocrinology: Diabetes - Testing Supplies Passed - 11/16/2021  9:25 AM      Passed - Valid encounter within last 12 months    Recent Outpatient Visits           1 month ago Syncope, unspecified syncope type   [facility] Glean Hess, MD   2 months ago Annual physical exam   [facility] Glean Hess, MD   4 months ago Subacute vaginitis   [facility] Glean Hess, MD   7 months ago Type II diabetes mellitus with complication [facility])   [facility] Glean Hess, MD   9 months ago Dizziness   [facility] Glean Hess, MD

## 2021-11-20 DIAGNOSIS — H59021 Cataract (lens) fragments in eye following cataract surgery, right eye: Secondary | ICD-10-CM | POA: Diagnosis not present

## 2021-11-21 DIAGNOSIS — R55 Syncope and collapse: Secondary | ICD-10-CM | POA: Diagnosis not present

## 2021-11-23 DIAGNOSIS — H209 Unspecified iridocyclitis: Secondary | ICD-10-CM | POA: Diagnosis not present

## 2021-12-05 ENCOUNTER — Ambulatory Visit: Payer: Self-pay | Admitting: *Deleted

## 2021-12-05 NOTE — Telephone Encounter (Signed)
°  Chief Complaint: Patient needs adjustment on her insulin Rx- sig for increased dosing- she is running out. Patient has been scheduled for tomorrow due to her increased glucose readings and she does have enough insulin to last until appointment elevated glucose Symptoms: - no symptoms reported- although patient states she has had recent cataract surgery and she has been under the weather recently and not following her diet. Frequency:   Pertinent Negatives: Patient denies fever, frequent urination, difficulty breathing, dizziness, weakness, vomiting Disposition: [] ED /[] Urgent Care (no appt availability in office) / [x] Appointment(In office/virtual)/ []  [place] [facility]/ [] Home Care/ [] Refused Recommended Disposition /[] [place] [facility]/ []  Follow-up with PCP Additional Notes:

## 2021-12-05 NOTE — Telephone Encounter (Signed)
Summary: pt having issues with high BS and needs earlier refill/ discuss with nurse   insulin detemir (LEVEMIR FLEXTOUCH) 100 UNIT/ML FlexPen 15 mL 1 10/26/2021  Sig - Route: Inject 30 Units into the skin daily. - Subcutaneous  Sent to pharmacy as: insulin detemir (LEVEMIR FLEXTOUCH) 100 UNIT/ML FlexPen  Notes to Pharmacy: DX Code Needed  .  E-Prescribing Status: Receipt confirmed by pharmacy (10/26/2021  2:58 PM EST)   Pt states her BS has been running high and that Dr B had told her to chg up to 40 units instead of 30. She states this has made her run low to early and now she will be out. She wants script changed and refill, and is requesting that the nurse call her back to discuss as still run high sometimes.   [phone]      Reason for Disposition  [1] Caller has NON-URGENT medication or insulin pump question AND [2] triager unable to answer question  Answer Assessment - Initial Assessment Questions 1. BLOOD GLUCOSE: "What is your blood glucose level?"      A1c 9.6 at house call- patient had been drinking a lot of juice because she had been sick recently 2. ONSET: "When did you check the blood glucose?"     Monday- patient has not checked it since Monday 3. USUAL RANGE: "What is your glucose level usually?" (e.g., usual fasting morning value, usual evening value)     Fasting- 95-100 is normal, Monday- 171 fasting, patient is checking now- reports she ate 1 hour ago- 228 4. KETONES: "Do you check for ketones (urine or blood test strips)?" If yes, ask: "What does the test show now?"      no 5. TYPE 1 or 2:  "Do you know what type of diabetes you have?"  (e.g., Type 1, Type 2, Gestational; doesn't know)      Type 2 6. INSULIN: "Do you take insulin?" "What type of insulin(s) do you use? What is the mode of delivery? (syringe, pen; injection or pump)?"      Levemir 40 units at night 7. DIABETES PILLS: "Do you take any pills for your diabetes?" If yes, ask: "Have you missed taking any  pills recently?"     Yes- glipizide 8. OTHER SYMPTOMS: "Do you have any symptoms?" (e.g., fever, frequent urination, difficulty breathing, dizziness, weakness, vomiting)     No 9. PREGNANCY: "Is there any chance you are pregnant?" "When was your last menstrual period?"     *No Answer*  Protocols used: Diabetes - High Blood Sugar-A-AH

## 2021-12-06 ENCOUNTER — Other Ambulatory Visit: Payer: Self-pay

## 2021-12-06 ENCOUNTER — Encounter: Payer: Self-pay | Admitting: Internal Medicine

## 2021-12-06 ENCOUNTER — Ambulatory Visit (INDEPENDENT_AMBULATORY_CARE_PROVIDER_SITE_OTHER): Payer: Medicare Other | Admitting: Internal Medicine

## 2021-12-06 VITALS — BP 122/64 | HR 81 | Ht 69.0 in

## 2021-12-06 DIAGNOSIS — N1831 Chronic kidney disease, stage 3a: Secondary | ICD-10-CM

## 2021-12-06 DIAGNOSIS — R55 Syncope and collapse: Secondary | ICD-10-CM

## 2021-12-06 DIAGNOSIS — I10 Essential (primary) hypertension: Secondary | ICD-10-CM | POA: Diagnosis not present

## 2021-12-06 DIAGNOSIS — E118 Type 2 diabetes mellitus with unspecified complications: Secondary | ICD-10-CM

## 2021-12-06 LAB — POCT URINALYSIS DIPSTICK
Bilirubin, UA: NEGATIVE
Glucose, UA: POSITIVE — AB
Ketones, UA: NEGATIVE
Leukocytes, UA: NEGATIVE
Nitrite, UA: NEGATIVE
Protein, UA: POSITIVE — AB
Spec Grav, UA: >=1.03 — AB (ref 1.010–1.025)
Urobilinogen, UA: 0.2 E.U./dL
pH, UA: 6 (ref 5.0–8.0)

## 2021-12-06 LAB — POCT GLYCOSYLATED HEMOGLOBIN (HGB A1C): Hemoglobin A1C: 10 % — AB (ref 4.0–5.6)

## 2021-12-06 MED ORDER — GLUCOSE BLOOD VI STRP: ORAL_STRIP | 12 refills | Status: DC

## 2021-12-06 MED ORDER — LEVEMIR FLEXTOUCH 100 UNIT/ML ~~LOC~~ SOPN: PEN_INJECTOR | SUBCUTANEOUS | 1 refills | Status: DC

## 2021-12-06 NOTE — Progress Notes (Signed)
Date:  12/06/2021   Name:  Cathy Murphy   DOB:  September 09, 1941   MRN:  [id number]   Chief Complaint: Diabetes (Last BS 63 yesterday without fasting ) and Hypertension  Diabetes She presents for her follow-up diabetic visit. She has type 2 diabetes mellitus. 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 Pertinent negatives for hypoglycemia include no dizziness, headaches or nervousness/anxiousness. Associated symptoms include fatigue. Pertinent negatives for diabetes include no chest pain. Current diabetic treatments: glipizide, actos, januvia, insulin. She is compliant with treatment all of the time. Her home blood glucose trend is increasing steadily. Her breakfast blood glucose is taken between 7-8 am. Her breakfast blood glucose range is generally >200 mg/dl. An ACE inhibitor/angiotensin II receptor blocker is being taken.  Hypertension This is a chronic problem. The problem is controlled. Pertinent negatives include no chest pain, headaches or shortness of breath. Past treatments include ACE inhibitors and diuretics.  Syncope - seen in December by Cardiology - ordered Carotid US and ECHO which have not been done.  Monitor without malignant rhythm.  She says that she had one episode of syncope while wearing the monitor.  Cardiology appt upcoming to discuss testing.    Holter monitor: Baseline normal sinus rhythm with maximum heart rate of 151 bpm minimum of 65 beats per an average of 85 bpm. There is a rare preventricular contraction. Heart rate is variable depending on activity levels with some rare episodes of supraventricular tachycardia of 3 and 4 beats. No evidence of malignant tachycardia symptomatic bradycardia and/or advanced heart block.  Lab Results  Component Value Date   NA 140 08/29/2021   K 3.9 08/29/2021   CO2 24 08/29/2021   GLUCOSE 158 (H) 08/29/2021   BUN 17 08/29/2021   CREATININE 1.28 (H) 08/29/2021   CALCIUM 9.2 08/29/2021   EGFR 43 (L)  08/29/2021   GFRNONAA 38 (L) 08/16/2020   Lab Results  Component Value Date   CHOL 159 08/29/2021   HDL 58 08/29/2021   LDLCALC 81 08/29/2021   TRIG 110 08/29/2021   CHOLHDL 2.7 08/29/2021   Lab Results  Component Value Date   TSH 2.320 08/29/2021   Lab Results  Component Value Date   HGBA1C 8.1 (A) 07/10/2021   Lab Results  Component Value Date   WBC 6.5 08/29/2021   HGB 14.2 08/29/2021   HCT 44.6 08/29/2021   MCV 88 08/29/2021   PLT 182 08/29/2021   Lab Results  Component Value Date   ALT 13 08/29/2021   AST 17 08/29/2021   ALKPHOS 67 08/29/2021   BILITOT 1.5 (H) 08/29/2021   Lab Results  Component Value Date   VD25OH 12.8 (L) 06/15/2015     Review of Systems  Constitutional:  Positive for fatigue. Negative for chills, fever and unexpected weight change.  Respiratory:  Negative for cough, chest tightness and shortness of breath.   Cardiovascular:  Negative for chest pain and leg swelling.  Gastrointestinal:  Negative for abdominal pain.  Neurological:  Positive for syncope. Negative for dizziness and headaches.  Psychiatric/Behavioral:  Negative for dysphoric mood and sleep disturbance. The patient is not nervous/anxious.    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   Ventral hernia without obstruction or gangrene 03/06/2016   Type II diabetes mellitus with complication ([place]) 54/65/6812   Plantar fasciitis, right 06/16/2015   Knee torn cartilage  04/26/2015   Allergic rhinitis, seasonal 04/26/2015   Essential (primary) hypertension 04/26/2015   Glaucoma 04/26/2015   Hyperlipidemia associated with type 2 diabetes mellitus ([place]) 04/26/2015   Insomnia 04/26/2015    Allergies  Allergen Reactions   Alprazolam Nausea Only   Amoxicillin Diarrhea   Aricept [Donepezil] Other (See Comments)    Leg cramps   Aspirin Nausea And Vomiting   Metformin And Related Diarrhea   Oxycodone Other (See Comments)    CNS side effects/agitation   Riomet [Metformin Hcl] Nausea And Vomiting   Sulfa Antibiotics    Hydrocodone Rash   Nabumetone Rash   Skelaxin [Metaxalone] Rash    Past Surgical History:  Procedure Laterality Date   ABDOMINAL HYSTERECTOMY     CATARACT EXTRACTION W/PHACO Left 10/01/2021   Procedure: CATARACT EXTRACTION PHACO AND INTRAOCULAR LENS PLACEMENT ([place]) LEFT HYDRUS MICROSTENT DIABETIC;  Surgeon: Eulogio Bear, MD;  Location: [facility];  Service: Ophthalmology;  Laterality: Left;  Diabetic 7.49 00:42.1   CATARACT EXTRACTION W/PHACO Right 10/15/2021   Procedure: PHACO EMULSIFICATION  with HYDRUS MICROSTENT iMPLANTATION;  Surgeon: Eulogio Bear, MD;  Location: [facility];  Service: Ophthalmology;  Laterality: Right;  Diabetic 4.98 00:27.3   COLONOSCOPY WITH PROPOFOL N/A 11/25/2017   Procedure: COLONOSCOPY WITH PROPOFOL;  Surgeon: [name], MD;  Location: [facility] ENDOSCOPY;  Service: Endoscopy;  Laterality: N/A;   EYE SURGERY     LIPOMA RESECTION     right posterior neck    Social History   Tobacco Use   Smoking status: Former    Packs/day: 1.00    Years: 7.00    Pack years: 7.00    Types: Cigarettes    Quit date: 1965    Years since quitting: 58.1   Smokeless tobacco: Never   Tobacco comments:    smoking cessation materials not required  Vaping Use   Vaping Use: Never used  Substance Use Topics   Alcohol use: No   Drug use: No     Medication list has been reviewed and  updated.  Current Meds  Medication Sig   B-D UF III MINI PEN NEEDLES 31G X 5 MM MISC USE AS DIRECTED WITH LEVEMIR   cholecalciferol (VITAMIN D) 1000 units tablet Take 2,000 Units by mouth daily.   Cholecalciferol 25 MCG (1000 UT) tablet Take by mouth.   ezetimibe (ZETIA) 10 MG tablet Take 10 mg by mouth daily.   glipiZIDE (GLUCOTROL) 5 MG tablet TAKE 1 TABLET BY MOUTH TWICE A DAY   glucose blood test strip Use to test blood sugar up to twice daily.   hydrOXYzine (ATARAX/VISTARIL) 25 MG tablet Take 0.5-1 tablets (12.5-25 mg total) by mouth every 8 (eight) hours as needed for anxiety.   insulin detemir (LEVEMIR FLEXTOUCH) 100 UNIT/ML FlexPen Inject 30 Units into the skin daily.   JANUVIA 100 MG tablet TAKE 1 TABLET BY MOUTH EVERY DAY   lisinopril (ZESTRIL) 40 MG tablet TAKE 1 TABLET BY MOUTH EVERY DAY   pioglitazone (ACTOS) 15 MG tablet TAKE 1 TABLET BY MOUTH EVERY DAY   rosuvastatin (CRESTOR) 40 MG tablet TAKE 1  TABLET BY MOUTH EVERY DAY   traMADol (ULTRAM) 50 MG tablet Take 1 tablet (50 mg total) by mouth every 6 (six) hours as needed.   traZODone (DESYREL) 50 MG tablet TAKE 1 TABLET BY MOUTH EVERYDAY AT BEDTIME    PHQ 2/9 Scores 10/12/2021 08/29/2021 07/16/2021 07/10/2021  PHQ - 2 Score 0 1 1 0  PHQ- 9 Score $Remov'2 3 4 'YSoTFy$ 0    GAD 7 : Generalized Anxiety Score 10/12/2021 08/29/2021 07/10/2021 04/17/2021  Nervous, Anxious, on Edge $Remov'1 1 1 1  'oaIKQX$ Control/stop worrying 0 1 0 0  Worry too much - different things 0 $Remove'1 1 1  'NRcMkWE$ Trouble relaxing $RemoveBeforeDE'1 1 1 1  'NzLbnyQiGNxxjuj$ Restless 0 1 0 0  Easily annoyed or irritable 0 1 0 0  Afraid - awful might happen 0 1 1 0  Total GAD 7 Score $Remov'2 7 4 3  'hhslof$ Anxiety Difficulty Not difficult at all Not difficult at all - Not difficult at all    BP Readings from Last 3 Encounters:  12/06/21 122/64  10/19/21 129/70  10/15/21 (!) 132/57    Physical Exam Vitals and nursing note reviewed.  Constitutional:      General: She is not in acute distress.    Appearance: Normal appearance. She is  well-developed.  HENT:     Head: Normocephalic and atraumatic.  Cardiovascular:     Rate and Rhythm: Normal rate and regular rhythm.     Pulses: Normal pulses.     Heart sounds: No murmur heard. Pulmonary:     Effort: Pulmonary effort is normal. No respiratory distress.     Breath sounds: No wheezing or rhonchi.  Musculoskeletal:     Cervical back: Normal range of motion.     Right lower leg: No edema.     Left lower leg: No edema.  Lymphadenopathy:     Cervical: No cervical adenopathy.  Skin:    General: Skin is warm and dry.     Capillary Refill: Capillary refill takes less than 2 seconds.     Findings: No rash.  Neurological:     General: No focal deficit present.     Mental Status: She is alert and oriented to person, place, and time.  Psychiatric:        Mood and Affect: Mood normal.        Behavior: Behavior normal.    Wt Readings from Last 3 Encounters:  10/19/21 177 lb 14.6 oz (80.7 kg)  10/15/21 178 lb (80.7 kg)  10/12/21 180 lb (81.6 kg)    BP 122/64    Pulse 81    Ht $R'5\' 9"'wb$  (1.753 m)    SpO2 97%    BMI 26.27 kg/m   Assessment and Plan: 1. Type II diabetes mellitus with complication (HCC) U4Q is worsening - likely due to diet. She is compliant with medications and insulin but diet has not been good during the past few months due to complications after eye surgery. Will increase Levemir to 30 u AM and 20 u PM Recheck in 3 months - Microalbumin / creatinine urine ratio - POCT glycosylated hemoglobin (Hb A1C) - POCT urinalysis dipstick - insulin detemir (LEVEMIR FLEXTOUCH) 100 UNIT/ML FlexPen; Inject 30 Units into the skin every morning AND 20 Units at bedtime.  Dispense: 18 mL; Refill: 1 - glucose blood test strip; Use to test blood sugar up to twice daily.  Dispense: 100 each; Refill: 12 Sample of Tresiba given to bridge the gap in insurance if needed.  2. Essential (primary)  hypertension Clinically stable exam with well controlled BP. Tolerating  medications without side effects at this time. It appears that she is not taking spironolactone - she will check her home meds. Pt to continue current regimen and low sodium diet; benefits of regular exercise as able discussed.  3. Stage 3a chronic kidney disease (HCC) Stable; last GFR 43  4. Syncope, unspecified syncope type She continues to have intermittent episodes. I have encouraged her to follow through with the cardiac workup including Carotid US and ECHO.   Partially dictated using Editor, commissioning. Any errors are unintentional.  Halina Maidens, MD [facility]  12/06/2021

## 2021-12-06 NOTE — Patient Instructions (Signed)
Check at home to see if you are taking Spironolactone and who is prescribing it.

## 2021-12-07 LAB — MICROALBUMIN / CREATININE URINE RATIO
Creatinine, Urine: 236.1 mg/dL
Microalb/Creat Ratio: 67 mg/g creat — ABNORMAL HIGH (ref 0–29)
Microalbumin, Urine: 158.1 ug/mL

## 2021-12-12 ENCOUNTER — Ambulatory Visit: Payer: Self-pay

## 2021-12-12 ENCOUNTER — Telehealth: Payer: Self-pay | Admitting: Internal Medicine

## 2021-12-12 DIAGNOSIS — R55 Syncope and collapse: Secondary | ICD-10-CM | POA: Diagnosis not present

## 2021-12-12 NOTE — Telephone Encounter (Signed)
°  Chief Complaint: hypoglycemia Symptoms: lightheaded and difficulty focusing Frequency: this morning and yesterday morning Pertinent Negatives: Patient denies symptoms present Disposition: [] ED /[] Urgent Care (no appt availability in office) / [] Appointment(In office/virtual)/ []  [facility]/ [] Home Care/ [] Refused Recommended Disposition /[] [facility]/ [x]  Follow-up with PCP Additional Notes: had OV on 12/07/21 and was given [place] in place of Levemir d/t pharmacy out of stock. Pt was wanting to see what she needed to do to prevent this  from happening every morning   Reason for Disposition  [1] Caller has NON-URGENT medication or insulin pump question AND [2] triager unable to answer question  Answer Assessment - Initial Assessment Questions 1. SYMPTOMS: "What symptoms are you concerned about?"     Lightheaded, difficulty focusing 2. ONSET:  "When did the symptoms start?"     Checked this morning 3. BLOOD GLUCOSE: "What is your blood glucose level?"      77 4. USUAL RANGE: "What is your blood glucose level usually?" (e.g., usual fasting morning value, usual evening value)     100 5. TYPE 1 or 2:  "Do you know what type of diabetes you have?"  (e.g., Type 1, Type 2, Gestational; doesn't know)      Type 2  6. INSULIN: "Do you take insulin?" "What type of insulin(s) do you use? What is the mode of delivery? (syringe, pen; injection or pump) "When did you last give yourself an insulin dose?" (i.e., time or hours/minutes ago) "How much did you give?" (i.e., how many units)     yes 7. DIABETES PILLS: "Do you take any pills for your diabetes?"     yes 8. OTHER SYMPTOMS: "Do you have any symptoms?" (e.g., fever, frequent urination, difficulty breathing, vomiting)     No 9. LOW BLOOD GLUCOSE TREATMENT: "What have you done so far to treat the low blood glucose level?"     Pb water  Protocols used: Diabetes - Low Blood Sugar-A-AH

## 2021-12-12 NOTE — Telephone Encounter (Signed)
ERROR

## 2021-12-13 NOTE — Telephone Encounter (Signed)
Informed pt to take 10 units of her bedtime insulin. Pt verbalized understanding.  KP

## 2021-12-14 ENCOUNTER — Other Ambulatory Visit: Payer: Self-pay | Admitting: Internal Medicine

## 2021-12-14 DIAGNOSIS — E118 Type 2 diabetes mellitus with unspecified complications: Secondary | ICD-10-CM

## 2021-12-14 NOTE — Telephone Encounter (Signed)
Requested medications are due for refill today.  unsure  Requested medications are on the active medications list.  yes  Last refill. 12/06/2021  Future visit scheduled.   yes  Notes to clinic.  Pharmacy states that medication is on backorder and is requesting an alternate medication.    Requested Prescriptions  Pending Prescriptions Disp Refills   LEVEMIR FLEXTOUCH 100 UNIT/ML FlexTouch Pen [Pharmacy Med Name: LEVEMIR FLEXPEN 100 UNIT/ML]  1    Sig: Inject 30 Units into the skin every morning AND 20 Units at bedtime.     Endocrinology:  Diabetes - Insulins Failed - 12/14/2021  4:12 PM      Failed - HBA1C is between 0 and 7.9 and within 180 days    Hemoglobin A1C  Date Value Ref Range Status  12/06/2021 10.0 (A) 4.0 - 5.6 % Final   HbA1c, POC (prediabetic range)  Date Value Ref Range Status  12/11/2020 0 (A) 5.7 - 6.4 % Final   HbA1c, POC (controlled diabetic range)  Date Value Ref Range Status  12/11/2020 0.0 0.0 - 7.0 % Final   HbA1c POC (<> result, manual entry)  Date Value Ref Range Status  12/11/2020 0 4.0 - 5.6 % Final   Hgb A1c MFr Bld  Date Value Ref Range Status  04/17/2021 10.6 (H) 4.8 - 5.6 % Final    Comment:             Prediabetes: 5.7 - 6.4          Diabetes: >6.4          Glycemic control for adults with diabetes: <7.0           Passed - Valid encounter within last 6 months    Recent Outpatient Visits           1 week ago Type II diabetes mellitus with complication [facility])   [facility] Glean Hess, MD   2 months ago Syncope, unspecified syncope type   [facility] Glean Hess, MD   3 months ago Annual physical exam   [facility] Glean Hess, MD   5 months ago Subacute vaginitis   [facility] Glean Hess, MD   8 months ago Type II diabetes mellitus with complication [facility])   [facility] Glean Hess, MD       Future Appointments             In 2 months [place]  Jesse Sans, MD [facility]

## 2021-12-17 ENCOUNTER — Other Ambulatory Visit: Payer: Self-pay | Admitting: Internal Medicine

## 2021-12-17 DIAGNOSIS — E118 Type 2 diabetes mellitus with unspecified complications: Secondary | ICD-10-CM

## 2021-12-17 MED ORDER — LANTUS SOLOSTAR 100 UNIT/ML ~~LOC~~ SOPN: PEN_INJECTOR | SUBCUTANEOUS | 11 refills | Status: DC

## 2021-12-17 NOTE — Telephone Encounter (Signed)
Please review. Medication is on back order.  KP

## 2021-12-19 ENCOUNTER — Telehealth: Payer: Self-pay | Admitting: Internal Medicine

## 2021-12-19 NOTE — Telephone Encounter (Signed)
Spoke to pt informed her that Lantus was sent in 30 in AM 20 in the evening. Pt verbalized understanding.  KP

## 2021-12-19 NOTE — Telephone Encounter (Signed)
Copied from [place] [id number]. Topic: General - Other >> Dec 19, 2021  9:38 AM Antoni[place] Iba C wrote: Reason for CRM: pt called in for assistance. Pt says that she was told that a different insulin Rx would be sent in. Pt says that she was told by her pharmacy that a Rx hasn't been sent in. Pt would like further assistance. Pt says that she will be out in a few days.

## 2021-12-25 DIAGNOSIS — M25462 Effusion, left knee: Secondary | ICD-10-CM | POA: Diagnosis not present

## 2021-12-25 DIAGNOSIS — M25562 Pain in left knee: Secondary | ICD-10-CM | POA: Diagnosis not present

## 2021-12-25 DIAGNOSIS — M1712 Unilateral primary osteoarthritis, left knee: Secondary | ICD-10-CM | POA: Diagnosis not present

## 2021-12-25 DIAGNOSIS — I700 Atherosclerosis of aorta: Secondary | ICD-10-CM | POA: Diagnosis not present

## 2022-01-14 ENCOUNTER — Ambulatory Visit
Admission: EM | Admit: 2022-01-14 | Discharge: 2022-01-14 | Disposition: A | Discharge status: Home / Self Care | Payer: Medicare Other

## 2022-01-14 ENCOUNTER — Other Ambulatory Visit: Payer: Self-pay

## 2022-01-14 DIAGNOSIS — M1712 Unilateral primary osteoarthritis, left knee: Secondary | ICD-10-CM | POA: Diagnosis not present

## 2022-01-14 MED ORDER — DICLOFENAC SODIUM 75 MG PO TBEC: 75.0000 mg | DELAYED_RELEASE_TABLET | Freq: Two times a day (BID) | ORAL | 0 refills | Status: DC

## 2022-01-14 MED ORDER — METHYLPREDNISOLONE SODIUM SUCC 40 MG IJ SOLR
60.0000 mg | Freq: Once | INTRAMUSCULAR | Status: AC
  Administered 2022-01-14: 60 mg via INTRAMUSCULAR

## 2022-01-14 NOTE — ED Triage Notes (Signed)
Left knee pain and swelling. Pt seen by ortho on 2/21 for same and was told it was arthritis and given Meloxicam which isn't helping. Xrays were done.  ?

## 2022-01-14 NOTE — ED Triage Notes (Signed)
Pt seen by ortho on Feb 21 for same. Told it was arthritis and given Meloxicam. Pt states not helping.  ?

## 2022-01-14 NOTE — Discharge Instructions (Addendum)
Your symptoms today are being caused by arthritis which was confirmed on your x-ray of your left knee on 12/25/2021 completed by Dr. Rosanna Randy.  Arthritis is and inflammatory process of the knee joint and will cause flareups which result in pain and swelling. ? ?You have been given a steroid injection here today in the office to help reduce some of the inflammation to your knee, this medication will increase your blood sugars for the next 2 to 3 days then they should slowly return to normal ? ?Begin use of diclofenac tablets taking twice a day for the next 14 days to further help reduce swelling and inflammation ? ?You may can continue use of heat over your left knee in 15-minute intervals ? ?You may continue elevating your knee while in sitting and lying positions ? ?You may begin use of knee sleeve which will add compression to the left knee, use primarily when walking ? ?Symptoms continue to persist she will need to follow-up with Dr. Rosanna Randy for further evaluation and management of your knee ?

## 2022-01-14 NOTE — ED Provider Notes (Signed)
[facility]    CSN: [id number] Arrival date & time: 01/14/22  5573      History   Chief Complaint Chief Complaint  Patient presents with   Knee Pain    HPI Cathy Murphy is a 81 y.o. female.   Patient presents with left knee pain and swelling for 2 to 3 weeks.  Endorses that symptoms have worsened over the last week.  Pain worsened by walking.  Able to bear weight.  Range of motion of leg intact.  Denies numbness or tingling, precipitating event, trauma or injury.  Was evaluated by orthopedic specialist on 12/25/2021, imaging obtained, diagnosed with osteoarthritis.  Has attempted use of meloxicam, Tylenol, heat, ice and Voltaren gel with no improvement.  She of diabetes mellitus, hyperlipidemia, hypertension.   Past Medical History:  Diagnosis Date   Allergy    Anxiety    COVID-19 09/08/2021   Fever, chills, sneezing, runny nose.  Resolved.   Diabetes mellitus without complication ([place])    Glaucoma    Hyperlipidemia    Hypertension     Patient Active Problem List   Diagnosis Date Noted   Syncope 12/06/2021   Difficulty sleeping 12/29/2020   Mild cognitive impairment 12/29/2020   Headache disorder 12/29/2020   Stage 3a chronic kidney disease ([place]) 08/16/2020   Osteopenia determined by x-ray 08/05/2019   Atherosclerosis of abdominal aorta ([place]) 06/09/2019   Coronary artery disease involving native coronary artery of native heart 06/09/2019   BMI 24.0-24.9, adult 11/06/2018   Bilateral neck pain 10/08/2018   Aortic calcification ([place]) 03/05/2018   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   Ventral hernia without obstruction or gangrene 03/06/2016   Type II diabetes mellitus with complication ([place]) 22/12/5425   Plantar fasciitis, right 06/16/2015   Knee torn cartilage 04/26/2015   Allergic rhinitis, seasonal 04/26/2015   Essential (primary) hypertension 04/26/2015   Glaucoma 04/26/2015   Hyperlipidemia associated with type 2 diabetes mellitus ([place]) 04/26/2015    Insomnia 04/26/2015    Past Surgical History:  Procedure Laterality Date   ABDOMINAL HYSTERECTOMY     CATARACT EXTRACTION W/PHACO Left 10/01/2021   Procedure: CATARACT EXTRACTION PHACO AND INTRAOCULAR LENS PLACEMENT ([place]) LEFT HYDRUS MICROSTENT DIABETIC;  Surgeon: Eulogio Bear, MD;  Location: [facility];  Service: Ophthalmology;  Laterality: Left;  Diabetic 7.49 00:42.1   CATARACT EXTRACTION W/PHACO Right 10/15/2021   Procedure: PHACO EMULSIFICATION  with HYDRUS MICROSTENT iMPLANTATION;  Surgeon: Eulogio Bear, MD;  Location: [facility];  Service: Ophthalmology;  Laterality: Right;  Diabetic 4.98 00:27.3   COLONOSCOPY WITH PROPOFOL N/A 11/25/2017   Procedure: COLONOSCOPY WITH PROPOFOL;  Surgeon: [name], MD;  Location: [facility];  Service: Endoscopy;  Laterality: N/A;   EYE SURGERY     LIPOMA RESECTION     right posterior neck    OB History   No obstetric history on file.      Home Medications    Prior to Admission medications   Medication Sig Start Date End Date Taking? Authorizing Provider  B-D UF III MINI PEN NEEDLES 31G X 5 MM MISC USE AS DIRECTED WITH LEVEMIR 11/16/21   Glean Hess, MD  cholecalciferol (VITAMIN D) 1000 units tablet Take 2,000 Units by mouth daily.    [provider]  Cholecalciferol 25 MCG (1000 UT) tablet Take by mouth.    [provider]  ezetimibe (ZETIA) 10 MG tablet Take 10 mg by mouth daily. 06/09/19   [provider]  glipiZIDE (GLUCOTROL) 5  MG tablet TAKE 1 TABLET BY MOUTH TWICE A DAY 10/07/21   Glean Hess, MD  glucose blood test strip Use to test blood sugar up to twice daily. 12/06/21   Glean Hess, MD  hydrOXYzine (ATARAX/VISTARIL) 25 MG tablet Take 0.5-1 tablets (12.5-25 mg total) by mouth every 8 (eight) hours as needed for anxiety. 04/17/21   Glean Hess, MD  insulin glargine (LANTUS SOLOSTAR) 100 UNIT/ML Solostar Pen Inject 30 Units into the skin every  morning AND 20 Units every evening. 12/17/21   Glean Hess, MD  JANUVIA 100 MG tablet TAKE 1 TABLET BY MOUTH EVERY DAY 08/20/21   Glean Hess, MD  lisinopril (ZESTRIL) 40 MG tablet TAKE 1 TABLET BY MOUTH EVERY DAY 10/07/21   Glean Hess, MD  LUMIGAN 0.01 % SOLN TAKE 1 DROP(S) IN BOTH EYES ONCE IN THE EVENING Patient not taking: Reported on 12/06/2021 03/01/15   [provider]  meloxicam (MOBIC) 15 MG tablet Take 15 mg by mouth daily. 12/25/21   [provider]  pioglitazone (ACTOS) 15 MG tablet TAKE 1 TABLET BY MOUTH EVERY DAY 11/12/21   Glean Hess, MD  prednisoLONE acetate (PRED FORTE) 1 % ophthalmic suspension  01/04/22   [provider]  rosuvastatin (CRESTOR) 40 MG tablet TAKE 1 TABLET BY MOUTH EVERY DAY 07/01/21   Glean Hess, MD  [facility] 1-0.2 % SUSP Apply 1 drop to eye 2 (two) times daily. Patient not taking: Reported on 12/06/2021 05/14/21   [provider]  spironolactone (ALDACTONE) 25 MG tablet Take 1 tablet (25 mg total) by mouth daily. Patient not taking: Reported on 12/06/2021 04/17/21   Glean Hess, MD  traMADol (ULTRAM) 50 MG tablet Take 1 tablet (50 mg total) by mouth every 6 (six) hours as needed. 10/11/21   Margarette Canada, NP  traZODone (DESYREL) 50 MG tablet TAKE 1 TABLET BY MOUTH EVERYDAY AT BEDTIME 11/12/21   Glean Hess, MD  albuterol [facility] HFA) [phone] Base) MCG/ACT inhaler Inhale 2 puffs into the lungs every 6 (six) hours as needed for wheezing or shortness of breath. 02/18/19 12/30/19  Glean Hess, MD    Family History Family History  Problem Relation Age of Onset   Cancer Mother        Breast   Breast cancer Mother    Heart disease Father    Heart attack Brother    Cancer Maternal Aunt        breast   Breast cancer Maternal Aunt    Breast cancer Cousin        mat cousin    Social History Social History   Tobacco Use   Smoking status: Former    Packs/day: 1.00    Years: 7.00    Pack  years: 7.00    Types: Cigarettes    Quit date: 1965    Years since quitting: 58.2   Smokeless tobacco: Never   Tobacco comments:    smoking cessation materials not required  Vaping Use   Vaping Use: Never used  Substance Use Topics   Alcohol use: No   Drug use: No     Allergies   Alprazolam, Amoxicillin, Aricept [donepezil], Aspirin, Metformin and related, Oxycodone, Riomet [metformin hcl], Sulfa antibiotics, Hydrocodone, Nabumetone, and Skelaxin [metaxalone]   Review of Systems Review of Systems Defer to HPI    Physical Exam Triage Vital Signs ED Triage Vitals  Enc Vitals Group     BP 01/14/22 0941 111/64  Pulse Rate 01/14/22 0941 90     Resp 01/14/22 0941 17     Temp 01/14/22 0941 98 F (36.7 C)     Temp Source 01/14/22 0941 Oral     SpO2 01/14/22 0941 100 %     Weight 01/14/22 0935 181 lb (82.1 kg)     Height 01/14/22 0935 '5\' 9"'$  (1.753 m)     Head Circumference --      Peak Flow --      Pain Score 01/14/22 0934 10     Pain Loc --      Pain Edu? --      Excl. in [place]? --    No data found.  Updated Vital Signs BP 111/64 (BP Location: Right Arm)    Pulse 90    Temp 98 F (36.7 C) (Oral)    Resp 17    Ht '5\' 9"'$  (1.753 m)    Wt 181 lb (82.1 kg)    SpO2 100%    BMI 26.73 kg/m   Visual Acuity Right Eye Distance:   Left Eye Distance:   Bilateral Distance:    Right Eye Near:   Left Eye Near:    Bilateral Near:     Physical Exam Constitutional:      Appearance: Normal appearance.  HENT:     Head: Normocephalic.  Eyes:     Extraocular Movements: Extraocular movements intact.  Pulmonary:     Effort: Pulmonary effort is normal.  Musculoskeletal:     Comments: Moderate swelling and tenderness to the anterior of the left knee without effusion noted, no ecchymosis noted, 2+ popliteal pulse, range of motion intact  Skin:    General: Skin is warm and dry.  Neurological:     Mental Status: She is alert and oriented to person, place, and time. Mental status is  at baseline.  Psychiatric:        Mood and Affect: Mood normal.        Behavior: Behavior normal.     UC Treatments / Results  Labs (all labs ordered are listed, but only abnormal results are displayed) Labs Reviewed - No data to display  EKG   Radiology No results found.  Procedures Procedures (including critical care time)  Medications Ordered in UC Medications - No data to display  Initial Impression / Assessment and Plan / UC Course  I have reviewed the triage vital signs and the nursing notes.  Pertinent labs & imaging results that were available during my care of the patient were reviewed by me and considered in my medical decision making (see chart for details).  Arthritis of left knee  Discussed etiology and management of arthritis, imaging reviewed from 12/25/2021, will not repeat imaging today, discussed findings with patient, methylprednisolone 60 mg injection given in office today, will defer oral steroid course due to history of diabetes, last A1c in February 20 23 10.0, as meloxicam has been ineffective we will attempt use of diclofenac 75 mg twice daily for 14 days, recommended continued RICE, may use heat in 15-minute intervals, recommended knee sleeve to be purchased at local pharmacy as urgent care does not have patient sizing at this time, for persistent or worsening symptoms patient is to follow-up with her orthopedic specialist for further management, verbalized understanding  Final Clinical Impressions(s) / UC Diagnoses   Final diagnoses:  None   Discharge Instructions   None    ED Prescriptions   None    PDMP not reviewed this encounter.  Hans Eden, NP 01/14/22 1044

## 2022-01-17 ENCOUNTER — Ambulatory Visit: Payer: Self-pay | Admitting: *Deleted

## 2022-01-17 NOTE — Telephone Encounter (Signed)
?  Chief Complaint: low blood sugar ?Symptoms: feels bad ?Frequency: every morning ?Pertinent Negatives: Patient denies taking HS dose of insulin ?Disposition: '[]'$ ED /'[]'$ Urgent Care (no appt availability in office) / '[x]'$ Appointment(In office/virtual)/ '[]'$  [facility]/ '[]'$ Home Care/ '[]'$ Refused Recommended Disposition /'[]'$ [facility]/ '[]'$  Follow-up with PCP ?Additional Notes: Made appt for tomorrow afternoon. Pt has been holding her night insulin but taking her morning dose of 30u even with glucose running from 61-109. She is having frequent episodes that sound like hypoglycemia although she has not checked her BS to see how low. Just checks first thing in the morning then takes insulin no matter what the number is.  ? ? ?Reason for Disposition ? [1] Blood glucose < 70  mg/dL (3.9 mmol/L) or symptomatic, now improved with Care Advice AND [2] cause unknown ? ?Answer Assessment - Initial Assessment Questions ?1. SYMPTOMS: "What symptoms are you concerned about?" ?    hypoglycemic ?2. ONSET:  "When did the symptoms start?" ?    Awhile ?3. BLOOD GLUCOSE: "What is your blood glucose level?"  ?    In the morning 109 or less, taking insulin anyway ?4. USUAL RANGE: "What is your blood glucose level usually?" (e.g., usual fasting morning value, usual evening value) ?    109 or under since on new medication ?5. TYPE 1 or 2:  "Do you know what type of diabetes you have?"  (e.g., Type 1, Type 2, Gestational; doesn't know)  ?    Type 2 ?6. INSULIN: "Do you take insulin?" "What type of insulin(s) do you use? What is the mode of delivery? (syringe, pen; injection or pump) "When did you last give yourself an insulin dose?" (i.e., time or hours/minutes ago) "How much did you give?" (i.e., how many units) ?   30u in morning 10u at night, holding night dose ? ?Protocols used: Diabetes - Low Blood Sugar-A-AH ? ?

## 2022-01-17 NOTE — Telephone Encounter (Signed)
Noted  KP 

## 2022-01-18 ENCOUNTER — Other Ambulatory Visit: Payer: Self-pay

## 2022-01-18 ENCOUNTER — Encounter: Payer: Self-pay | Admitting: Internal Medicine

## 2022-01-18 ENCOUNTER — Ambulatory Visit (INDEPENDENT_AMBULATORY_CARE_PROVIDER_SITE_OTHER): Payer: Medicare Other | Admitting: Internal Medicine

## 2022-01-18 VITALS — BP 132/74 | HR 88 | Ht 69.0 in | Wt 183.4 lb

## 2022-01-18 DIAGNOSIS — I10 Essential (primary) hypertension: Secondary | ICD-10-CM

## 2022-01-18 DIAGNOSIS — E118 Type 2 diabetes mellitus with unspecified complications: Secondary | ICD-10-CM

## 2022-01-18 DIAGNOSIS — I700 Atherosclerosis of aorta: Secondary | ICD-10-CM | POA: Diagnosis not present

## 2022-01-18 MED ORDER — GLIPIZIDE 5 MG PO TABS: 5.0000 mg | ORAL_TABLET | Freq: Two times a day (BID) | ORAL | 1 refills | Status: DC

## 2022-01-18 MED ORDER — LISINOPRIL 40 MG PO TABS: 40.0000 mg | ORAL_TABLET | Freq: Every day | ORAL | 1 refills | Status: DC

## 2022-01-18 NOTE — Patient Instructions (Signed)
Stop the evening insulin dose. ? ?Take only the morning dose around same time that you eat breakfast every day. ?

## 2022-01-18 NOTE — Progress Notes (Signed)
Date:  01/18/2022   Name:  Cathy Murphy   DOB:  04-10-1941   MRN:  [id number]   Chief Complaint: Diabetes  Diabetes She presents for her follow-up diabetic visit. She has type 2 diabetes mellitus. Her disease course has been fluctuating. Hypoglycemia symptoms include tremors (when BS is low). Pertinent negatives for hypoglycemia include no dizziness or headaches. (yesterday) Pertinent negatives for diabetes include no chest pain and no fatigue. Current diabetic treatment includes intensive insulin program (plus glipizide, Januvia and actos).  Hypertension This is a chronic problem. The problem is controlled. Pertinent negatives include no chest pain, headaches or shortness of breath.   She has been having low blood sugar almost every morning with symptoms. Most measures are 75-90 - nothing lower.  She eats breakfast around 9 am and dinner at 6 pm.  Insulin is dosed at 11 Am and again at 10 PM.  Lab Results  Component Value Date   NA 140 08/29/2021   K 3.9 08/29/2021   CO2 24 08/29/2021   GLUCOSE 158 (H) 08/29/2021   BUN 17 08/29/2021   CREATININE 1.28 (H) 08/29/2021   CALCIUM 9.2 08/29/2021   EGFR 43 (L) 08/29/2021   GFRNONAA 38 (L) 08/16/2020   Lab Results  Component Value Date   CHOL 159 08/29/2021   HDL 58 08/29/2021   LDLCALC 81 08/29/2021   TRIG 110 08/29/2021   CHOLHDL 2.7 08/29/2021   Lab Results  Component Value Date   TSH 2.320 08/29/2021   Lab Results  Component Value Date   HGBA1C 10.0 (A) 12/06/2021   Lab Results  Component Value Date   WBC 6.5 08/29/2021   HGB 14.2 08/29/2021   HCT 44.6 08/29/2021   MCV 88 08/29/2021   PLT 182 08/29/2021   Lab Results  Component Value Date   ALT 13 08/29/2021   AST 17 08/29/2021   ALKPHOS 67 08/29/2021   BILITOT 1.5 (H) 08/29/2021   Lab Results  Component Value Date   VD25OH 12.8 (L) 06/15/2015     Review of Systems  Constitutional:  Negative for chills, fatigue and fever.  Respiratory:  Negative for  cough, chest tightness and shortness of breath.   Cardiovascular:  Negative for chest pain and leg swelling.  Neurological:  Positive for tremors (when BS is low). Negative for dizziness, light-headedness (only when BS is low) and headaches.   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   Ventral hernia without obstruction or gangrene 03/06/2016   Type II diabetes mellitus with complication (HCC) 11/09/2015   Plantar fasciitis, right 06/16/2015   Knee torn cartilage 04/26/2015   Allergic rhinitis, seasonal 04/26/2015   Essential (primary) hypertension 04/26/2015   Glaucoma 04/26/2015   Hyperlipidemia associated with type 2 diabetes mellitus (HCC) 04/26/2015   Insomnia 04/26/2015    Allergies  Allergen Reactions   Alprazolam Nausea Only   Amoxicillin Diarrhea   Aricept [Donepezil] Other (See Comments)    Leg cramps   Aspirin Nausea And Vomiting   Metformin  And Related Diarrhea   Oxycodone Other (See Comments)    CNS side effects/agitation   Riomet [Metformin Hcl] Nausea And Vomiting   Sulfa Antibiotics    Hydrocodone Rash   Nabumetone Rash   Skelaxin [Metaxalone] Rash    Past Surgical History:  Procedure Laterality Date   ABDOMINAL HYSTERECTOMY     CATARACT EXTRACTION W/PHACO Left 10/01/2021   Procedure: CATARACT EXTRACTION PHACO AND INTRAOCULAR LENS PLACEMENT ([facility]) LEFT HYDRUS MICROSTENT DIABETIC;  Surgeon: Nevada Crane, MD;  Location: [facility];   Service: Ophthalmology;  Laterality: Left;  Diabetic 7.49 00:42.1   CATARACT EXTRACTION W/PHACO Right 10/15/2021   Procedure: PHACO EMULSIFICATION  with HYDRUS MICROSTENT iMPLANTATION;  Surgeon: Nevada Crane, MD;  Location: [facility];  Service: Ophthalmology;  Laterality: Right;  Diabetic 4.98 00:27.3   COLONOSCOPY WITH PROPOFOL N/A 11/25/2017   Procedure: COLONOSCOPY WITH PROPOFOL;  Surgeon: Christena Deem, MD;  Location: [facility] ENDOSCOPY;  Service: Endoscopy;  Laterality: N/A;   EYE SURGERY     LIPOMA RESECTION     right posterior neck    Social History   Tobacco Use   Smoking status: Former    Packs/day: 1.00    Years: 7.00    Pack years: 7.00    Types: Cigarettes    Quit date: 1965    Years since quitting: 58.2   Smokeless tobacco: Never   Tobacco comments:    smoking cessation materials not required  Vaping Use   Vaping Use: Never used  Substance Use Topics   Alcohol use: No   Drug use: No     Medication list has been reviewed and updated.  Current Meds  Medication Sig   B-D UF III MINI PEN NEEDLES 31G X 5 MM MISC USE AS DIRECTED WITH LEVEMIR   cholecalciferol (VITAMIN D) 1000 units tablet Take 2,000 Units by mouth daily.   Cholecalciferol 25 MCG (1000 UT) tablet Take by mouth.   diclofenac (VOLTAREN) 75 MG EC tablet Take 1 tablet (75 mg total) by mouth 2 (two) times daily.   ezetimibe (ZETIA) 10 MG tablet Take 10 mg by mouth daily.   glipiZIDE (GLUCOTROL) 5 MG tablet TAKE 1 TABLET BY MOUTH TWICE A DAY   glucose blood test strip Use to test blood sugar up to twice daily.   hydrOXYzine (ATARAX/VISTARIL) 25 MG tablet Take 0.5-1 tablets (12.5-25 mg total) by mouth every 8 (eight) hours as needed for anxiety.   insulin glargine (LANTUS SOLOSTAR) 100 UNIT/ML Solostar Pen Inject 30 Units into the skin every morning AND 20 Units every evening. (Patient taking differently: Inject 30 Units into the skin every morning AND 10 Units every evening.)   JANUVIA  100 MG tablet TAKE 1 TABLET BY MOUTH EVERY DAY   lisinopril (ZESTRIL) 40 MG tablet TAKE 1 TABLET BY MOUTH EVERY DAY   meloxicam (MOBIC) 15 MG tablet Take 15 mg by mouth daily.   pioglitazone (ACTOS) 15 MG tablet TAKE 1 TABLET BY MOUTH EVERY DAY   prednisoLONE acetate (PRED FORTE) 1 % ophthalmic suspension    rosuvastatin (CRESTOR) 40 MG tablet TAKE 1 TABLET BY MOUTH EVERY DAY   SIMBRINZA 1-0.2 % SUSP Apply 1 drop to eye 2 (two) times daily.   spironolactone (ALDACTONE) 25 MG tablet Take 1 tablet (25 mg total) by mouth daily.   traMADol (ULTRAM) 50 MG tablet Take 1 tablet (50 mg total) by mouth every 6 (six) hours as needed.   traZODone (DESYREL) 50 MG tablet TAKE 1 TABLET  BY MOUTH EVERYDAY AT BEDTIME    PHQ 2/9 Scores 01/18/2022 12/06/2021 10/12/2021 08/29/2021  PHQ - 2 Score 2 2 0 1  PHQ- 9 Score 7 4 2 3     GAD 7 : Generalized Anxiety Score 01/18/2022 12/06/2021 10/12/2021 08/29/2021  Nervous, Anxious, on Edge 1 1 1 1   Control/stop worrying 1 0 0 1  Worry too much - different things 1 0 0 1  Trouble relaxing 2 1 1 1   Restless 1 0 0 1  Easily annoyed or irritable 1 0 0 1  Afraid - awful might happen 1 0 0 1  Total GAD 7 Score 8 2 2 7   Anxiety Difficulty Not difficult at all - Not difficult at all Not difficult at all    BP Readings from Last 3 Encounters:  01/18/22 132/74  01/14/22 111/64  12/06/21 122/64    Physical Exam Vitals and nursing note reviewed.  Constitutional:      General: She is not in acute distress.    Appearance: Normal appearance. She is well-developed.  HENT:     Head: Normocephalic and atraumatic.  Pulmonary:     Effort: Pulmonary effort is normal. No respiratory distress.  Skin:    General: Skin is warm and dry.     Findings: No rash.  Neurological:     Mental Status: She is alert and oriented to person, place, and time.  Psychiatric:        Mood and Affect: Mood normal.        Behavior: Behavior normal.    Wt Readings from Last 3 Encounters:   01/18/22 183 lb 6.4 oz (83.2 kg)  01/14/22 181 lb (82.1 kg)  10/19/21 177 lb 14.6 oz (80.7 kg)    BP 132/74   Pulse 88   Ht 5\' 9"  (1.753 m)   Wt 183 lb 6.4 oz (83.2 kg)   SpO2 95%   BMI 27.08 kg/m   Assessment and Plan: 1. Essential (primary) hypertension Clinically stable exam with well controlled BP. Tolerating medications without side effects at this time. Pt to continue current regimen and low sodium diet; benefits of regular exercise as able discussed. - lisinopril (ZESTRIL) 40 MG tablet; Take 1 tablet (40 mg total) by mouth daily.  Dispense: 90 tablet; Refill: 1  2. Type II diabetes mellitus with complication (HCC) Recurrent low BS on bid insulin. Stop evening insulin dose and add a small snack at bedtime Take AM insulin with breakfast. Re-evaluate in May at regularly scheduled appointment. - glipiZIDE (GLUCOTROL) 5 MG tablet; Take 1 tablet (5 mg total) by mouth 2 (two) times daily.  Dispense: 180 tablet; Refill: 1   Partially dictated using Animal nutritionist. Any errors are unintentional.  Bari Edward, MD [facility]  01/18/2022

## 2022-02-08 ENCOUNTER — Other Ambulatory Visit: Payer: Self-pay | Admitting: Internal Medicine

## 2022-02-08 DIAGNOSIS — E119 Type 2 diabetes mellitus without complications: Secondary | ICD-10-CM

## 2022-02-08 NOTE — Telephone Encounter (Signed)
Requested Prescriptions  ?Pending Prescriptions Disp Refills  ?? traZODone (DESYREL) 50 MG tablet [Pharmacy Med Name: TRAZODONE 50 MG TABLET] 90 tablet 0  ?  Sig: TAKE 1 TABLET BY MOUTH EVERYDAY AT BEDTIME  ?  ? Psychiatry: Antidepressants - Serotonin Modulator Passed - 02/08/2022  2:24 AM  ?  ?  Passed - Valid encounter within last 6 months  ?  Recent Outpatient Visits   ?      ? 3 weeks ago Type II diabetes mellitus with complication ([place])  ? [facility] Glean Hess, MD  ? 2 months ago Type II diabetes mellitus with complication [facility])  ? [facility] Glean Hess, MD  ? 3 months ago Syncope, unspecified syncope type  ? [facility] Glean Hess, MD  ? 5 months ago Annual physical exam  ? [facility] Glean Hess, MD  ? 7 months ago Subacute vaginitis  ? [facility] Glean Hess, MD  ?  ?  ?Future Appointments   ?        ? In 3 weeks Glean Hess, MD [facility]  ?  ? ?  ?  ?  ?? pioglitazone (ACTOS) 15 MG tablet [Pharmacy Med Name: PIOGLITAZONE HCL 15 MG TABLET] 90 tablet 0  ?  Sig: TAKE 1 TABLET BY MOUTH EVERY DAY  ?  ? Endocrinology:  Diabetes - Glitazones - pioglitazone Failed - 02/08/2022  2:24 AM  ?  ?  Failed - HBA1C is between 0 and 7.9 and within 180 days  ?  Hemoglobin A1C  ?Date Value Ref Range Status  ?12/06/2021 10.0 (A) 4.0 - 5.6 % Final  ? ?HbA1c, POC (prediabetic range)  ?Date Value Ref Range Status  ?12/11/2020 0 (A) 5.7 - 6.4 % Final  ? ?HbA1c, POC (controlled diabetic range)  ?Date Value Ref Range Status  ?12/11/2020 0.0 0.0 - 7.0 % Final  ? ?HbA1c POC (<> result, manual entry)  ?Date Value Ref Range Status  ?12/11/2020 0 4.0 - 5.6 % Final  ? ?Hgb A1c MFr Bld  ?Date Value Ref Range Status  ?04/17/2021 10.6 (H) 4.8 - 5.6 % Final  ?  Comment:  ?           Prediabetes: 5.7 - 6.4 ?         Diabetes: >6.4 ?         Glycemic control for adults with diabetes: <7.0 ?  ?   ?  ?  Passed - Valid encounter within last 6  months  ?  Recent Outpatient Visits   ?      ? 3 weeks ago Type II diabetes mellitus with complication ([place])  ? [facility] Glean Hess, MD  ? 2 months ago Type II diabetes mellitus with complication [facility])  ? [facility] Glean Hess, MD  ? 3 months ago Syncope, unspecified syncope type  ? [facility] Glean Hess, MD  ? 5 months ago Annual physical exam  ? [facility] Glean Hess, MD  ? 7 months ago Subacute vaginitis  ? [facility] Glean Hess, MD  ?  ?  ?Future Appointments   ?        ? In 3 weeks Glean Hess, MD [facility] [place]  ?  ? ?  ?  ?  ? ? ?

## 2022-02-12 DIAGNOSIS — H59021 Cataract (lens) fragments in eye following cataract surgery, right eye: Secondary | ICD-10-CM | POA: Diagnosis not present

## 2022-02-12 DIAGNOSIS — H43813 Vitreous degeneration, bilateral: Secondary | ICD-10-CM | POA: Diagnosis not present

## 2022-02-12 DIAGNOSIS — Z961 Presence of intraocular lens: Secondary | ICD-10-CM | POA: Diagnosis not present

## 2022-02-14 DIAGNOSIS — N1831 Chronic kidney disease, stage 3a: Secondary | ICD-10-CM | POA: Diagnosis not present

## 2022-02-14 DIAGNOSIS — M25562 Pain in left knee: Secondary | ICD-10-CM | POA: Diagnosis not present

## 2022-02-14 DIAGNOSIS — Z79899 Other long term (current) drug therapy: Secondary | ICD-10-CM | POA: Diagnosis not present

## 2022-02-14 DIAGNOSIS — M8580 Other specified disorders of bone density and structure, unspecified site: Secondary | ICD-10-CM | POA: Diagnosis not present

## 2022-02-14 DIAGNOSIS — I129 Hypertensive chronic kidney disease with stage 1 through stage 4 chronic kidney disease, or unspecified chronic kidney disease: Secondary | ICD-10-CM | POA: Diagnosis not present

## 2022-02-14 DIAGNOSIS — Z794 Long term (current) use of insulin: Secondary | ICD-10-CM | POA: Diagnosis not present

## 2022-02-14 DIAGNOSIS — G8929 Other chronic pain: Secondary | ICD-10-CM | POA: Diagnosis not present

## 2022-02-14 DIAGNOSIS — Z888 Allergy status to other drugs, medicaments and biological substances status: Secondary | ICD-10-CM | POA: Diagnosis not present

## 2022-02-14 DIAGNOSIS — E785 Hyperlipidemia, unspecified: Secondary | ICD-10-CM | POA: Diagnosis not present

## 2022-02-14 DIAGNOSIS — I2510 Atherosclerotic heart disease of native coronary artery without angina pectoris: Secondary | ICD-10-CM | POA: Diagnosis not present

## 2022-02-14 DIAGNOSIS — E1122 Type 2 diabetes mellitus with diabetic chronic kidney disease: Secondary | ICD-10-CM | POA: Diagnosis not present

## 2022-02-14 DIAGNOSIS — Z886 Allergy status to analgesic agent status: Secondary | ICD-10-CM | POA: Diagnosis not present

## 2022-02-14 DIAGNOSIS — Z885 Allergy status to narcotic agent status: Secondary | ICD-10-CM | POA: Diagnosis not present

## 2022-02-14 DIAGNOSIS — Z880 Allergy status to penicillin: Secondary | ICD-10-CM | POA: Diagnosis not present

## 2022-02-14 DIAGNOSIS — M1712 Unilateral primary osteoarthritis, left knee: Secondary | ICD-10-CM | POA: Diagnosis not present

## 2022-02-19 LAB — HM DIABETES EYE EXAM

## 2022-02-22 DIAGNOSIS — M25562 Pain in left knee: Secondary | ICD-10-CM | POA: Diagnosis not present

## 2022-02-22 DIAGNOSIS — G8929 Other chronic pain: Secondary | ICD-10-CM | POA: Diagnosis not present

## 2022-02-22 DIAGNOSIS — M25462 Effusion, left knee: Secondary | ICD-10-CM | POA: Diagnosis not present

## 2022-02-22 DIAGNOSIS — E119 Type 2 diabetes mellitus without complications: Secondary | ICD-10-CM | POA: Diagnosis not present

## 2022-02-22 DIAGNOSIS — M1712 Unilateral primary osteoarthritis, left knee: Secondary | ICD-10-CM | POA: Diagnosis not present

## 2022-02-22 IMAGING — MR MR LUMBAR SPINE W/O CM
5 series · 31 of 48 positions shown · non-contrast
Comparison: None.

CLINICAL DATA: Chronic low back pain, unspecified back pain
laterality, unspecified whether sciatica present M54.50,
(TZH-62-CM)Spinal stenosis of lumbosacral region DKV.HV (TZH-62-CM).
MRI lumbar w/o // bilat leg pain x2 months with cramping // NKI or
surgery//no complaints of back pain

EXAM:
MRI LUMBAR SPINE WITHOUT CONTRAST
TECHNIQUE: Multiplanar, multisequence MR imaging of the lumbar spine was
performed. No intravenous contrast was administered.

[Series 5: T2 · sagittal · 4.0mm · 0.81mm/px · 6 of 17 slices shown (1 of 2)]
[im 1/17]
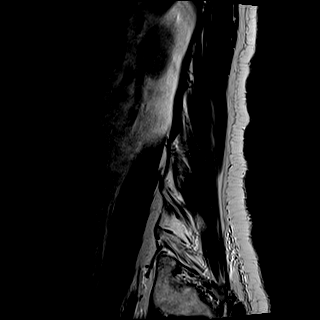
[im 4/17]
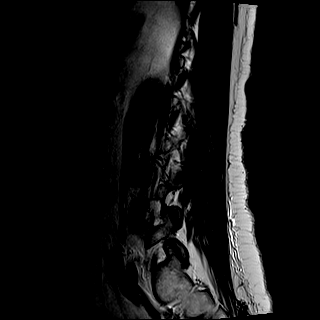
[im 7/17]
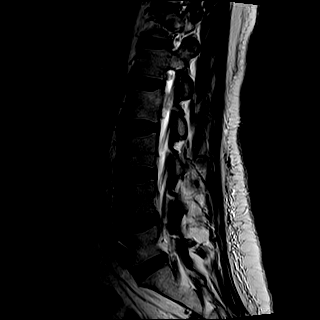
[im 10/17]
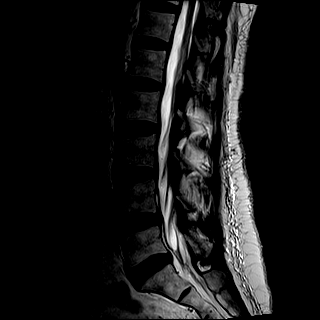
[im 13/17]
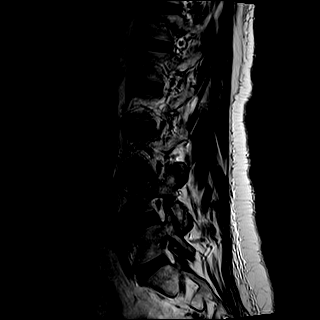
[im 17/17]
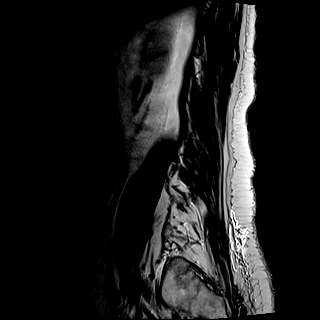

[Series 6: T1 · sagittal · 4.0mm · 0.81mm/px · 6 of 17 slices shown (1 of 2)]
[im 1/17]
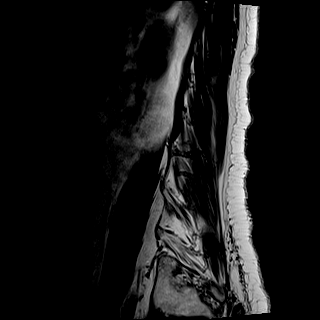
[im 4/17]
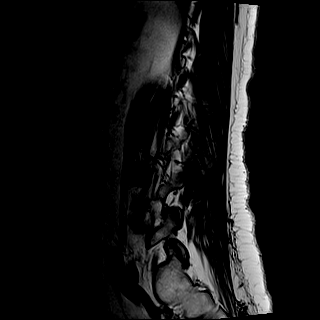
[im 7/17]
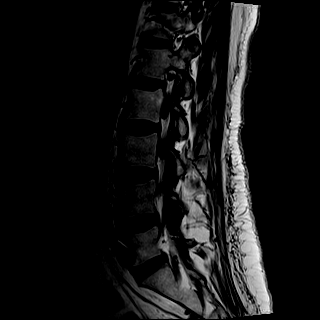
[im 10/17]
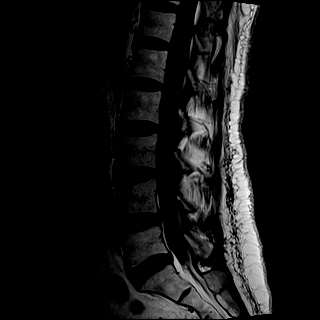
[im 13/17]
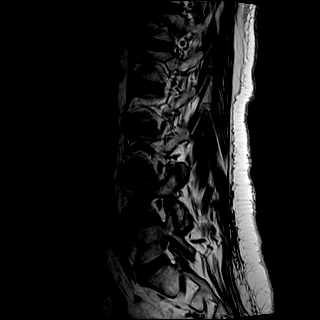
[im 17/17]
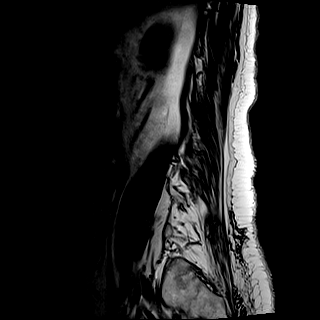

[Series 7: STIR · sagittal · 4.0mm · 0.41mm/px · 1 of 17 slices shown]
[im 1/17]
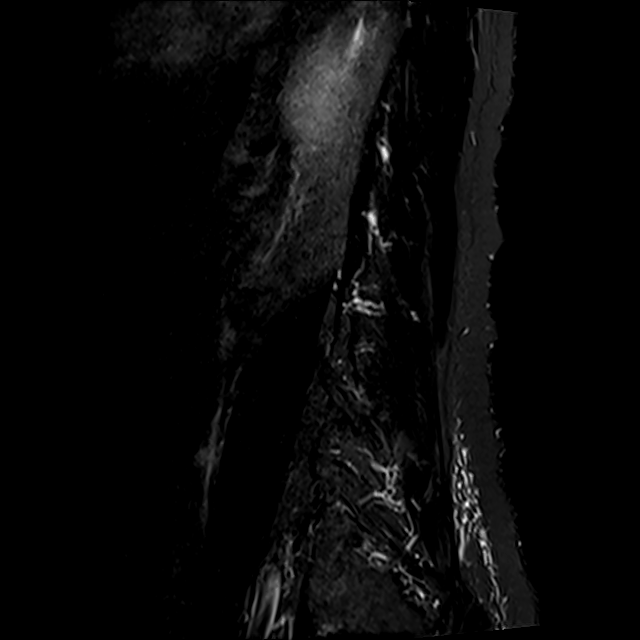

[Series 8: T2 · axial · 4.0mm · 0.78mm/px · z∈[-124,+112]mm · 9 of 42 slices shown (2 of 2)]
[im 1/42]
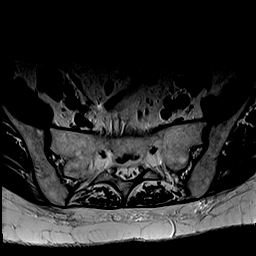
[im 6/42]
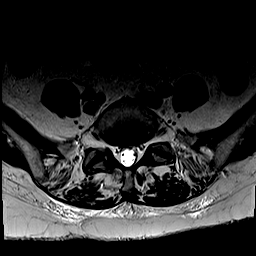
[im 12/42]
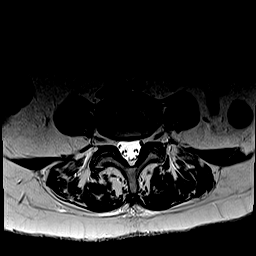
[im 18/42]
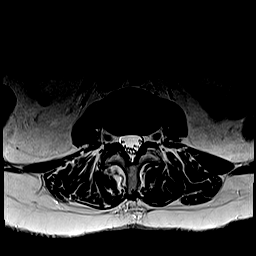
[im 21/42]
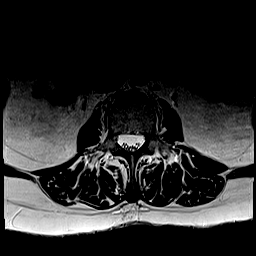
[im 24/42]
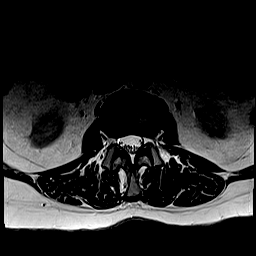
[im 30/42]
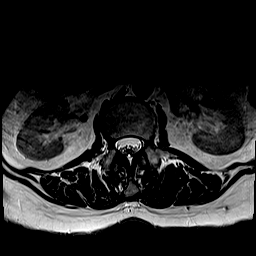
[im 36/42]
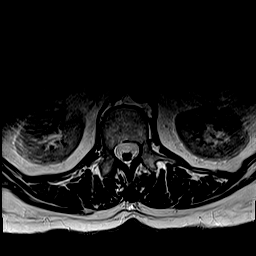
[im 42/42]
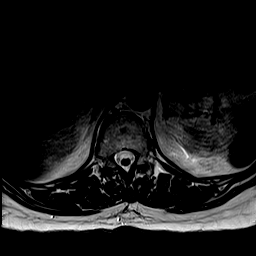

[Series 9: T1 · axial · 4.0mm · 0.39mm/px · z∈[-124,+112]mm · 9 of 42 slices shown (2 of 2)]
[im 1/42]
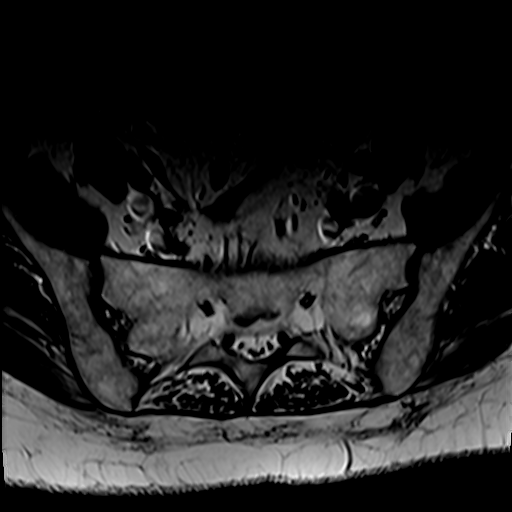
[im 6/42]
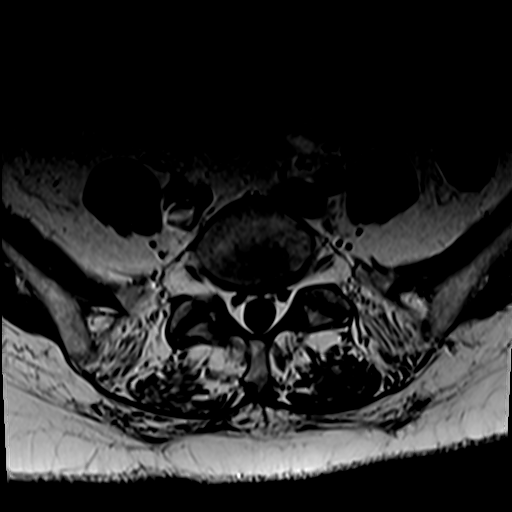
[im 12/42]
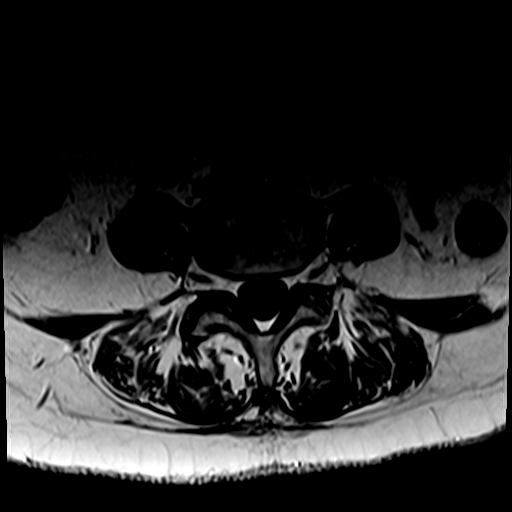
[im 18/42]
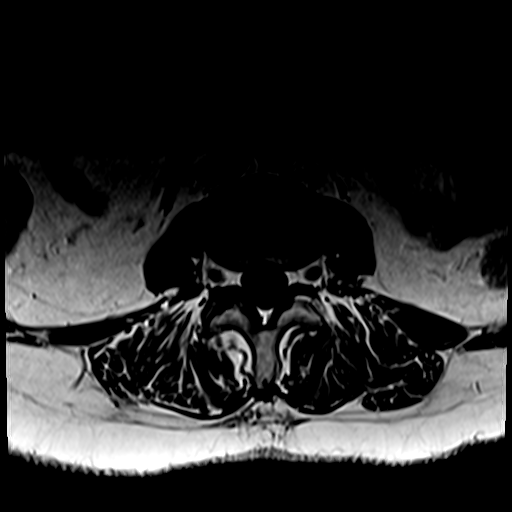
[im 21/42]
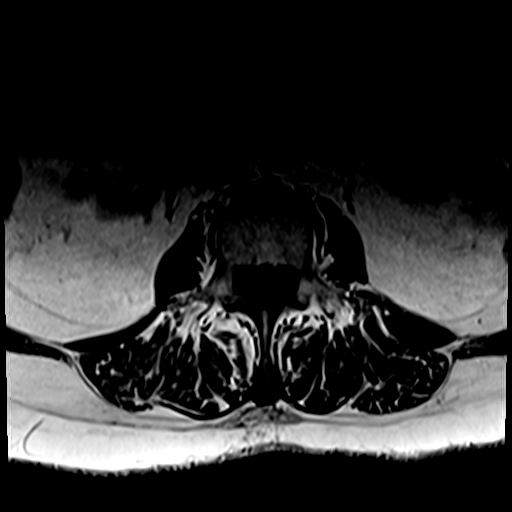
[im 24/42]
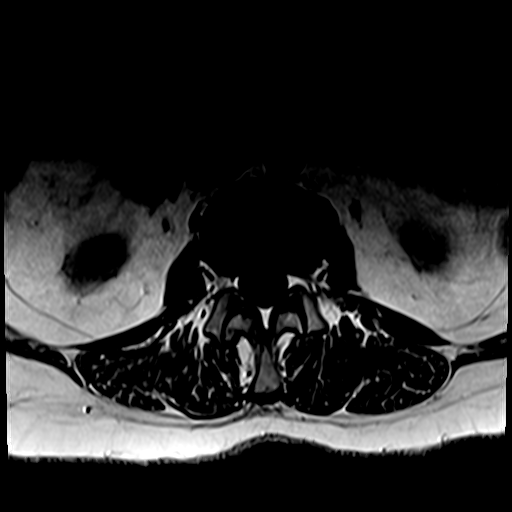
[im 30/42]
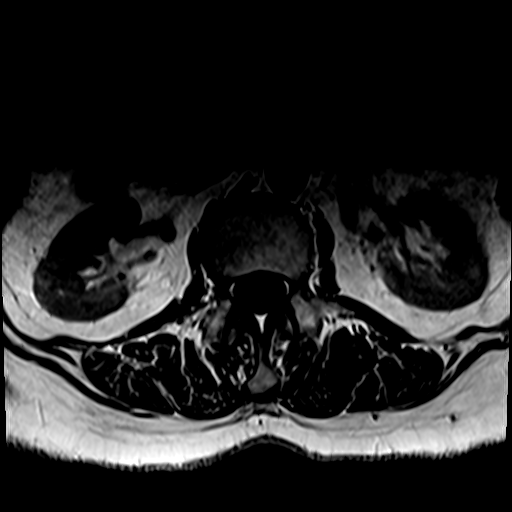
[im 36/42]
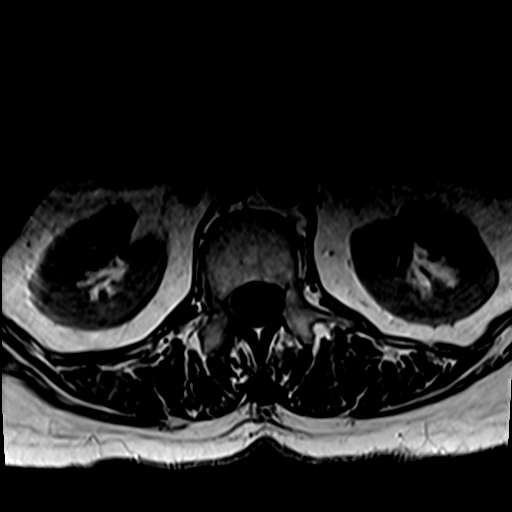
[im 42/42]
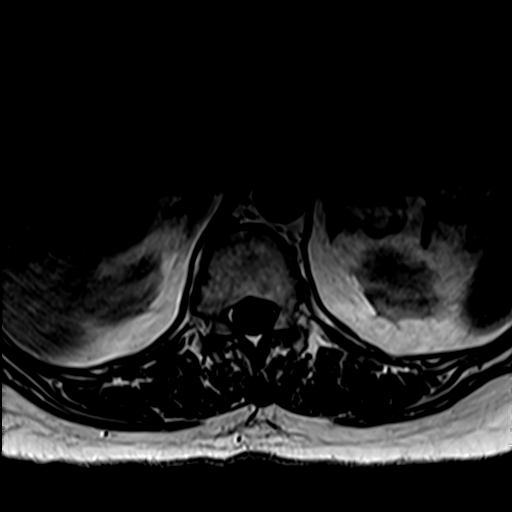

[31 of 48 positions shown; findings below may reference images not displayed]

FINDINGS: Segmentation:  Standard

Alignment:  Normal

Vertebrae:  No fracture, evidence of discitis, or bone lesion.

Conus medullaris and cauda equina: Conus extends to the L2 level.
Conus and cauda equina appear normal.

Paraspinal and other soft tissues: Normal

Disc levels:

The T12-L4 levels are normal.

L4-5: Disc desiccation with small central protrusion and mild facet
hypertrophy. No spinal canal or neural foraminal stenosis.

L5-S1: Small disc bulge and mild facet hypertrophy. No spinal canal
or neural foraminal stenosis.
IMPRESSION: Mild lower lumbar degenerative disc disease without spinal canal or
neural foraminal stenosis.

## 2022-02-23 ENCOUNTER — Other Ambulatory Visit: Payer: Self-pay | Admitting: Internal Medicine

## 2022-02-25 NOTE — Telephone Encounter (Signed)
Requested Prescriptions  ?Pending Prescriptions Disp Refills  ?? JANUVIA 100 MG tablet [Pharmacy Med Name: JANUVIA 100 MG TABLET] 90 tablet 1  ?  Sig: TAKE 1 TABLET BY MOUTH EVERY DAY  ?  ? Endocrinology:  Diabetes - DPP-4 Inhibitors Failed - 02/23/2022  1:07 PM  ?  ?  Failed - HBA1C is between 0 and 7.9 and within 180 days  ?  Hemoglobin A1C  ?Date Value Ref Range Status  ?12/06/2021 10.0 (A) 4.0 - 5.6 % Final  ? ?HbA1c, POC (prediabetic range)  ?Date Value Ref Range Status  ?12/11/2020 0 (A) 5.7 - 6.4 % Final  ? ?HbA1c, POC (controlled diabetic range)  ?Date Value Ref Range Status  ?12/11/2020 0.0 0.0 - 7.0 % Final  ? ?HbA1c POC (<> result, manual entry)  ?Date Value Ref Range Status  ?12/11/2020 0 4.0 - 5.6 % Final  ? ?Hgb A1c MFr Bld  ?Date Value Ref Range Status  ?04/17/2021 10.6 (H) 4.8 - 5.6 % Final  ?  Comment:  ?           Prediabetes: 5.7 - 6.4 ?         Diabetes: >6.4 ?         Glycemic control for adults with diabetes: <7.0 ?  ?   ?  ?  Failed - Cr in normal range and within 360 days  ?  Creatinine  ?Date Value Ref Range Status  ?07/22/2012 0.96 0.60 - 1.30 mg/dL Final  ? ?Creatinine, Ser  ?Date Value Ref Range Status  ?08/29/2021 1.28 (H) 0.57 - 1.00 mg/dL Final  ?   ?  ?  Passed - Valid encounter within last 6 months  ?  Recent Outpatient Visits   ?      ? 1 month ago Type II diabetes mellitus with complication ([place])  ? [facility] Glean Hess, MD  ? 2 months ago Type II diabetes mellitus with complication [facility])  ? [facility] Glean Hess, MD  ? 4 months ago Syncope, unspecified syncope type  ? [facility] Glean Hess, [facility]  ? 6 months ago Annual physical exam  ? [facility] Glean Hess, MD  ? 7 months ago Subacute vaginitis  ? [facility] Glean Hess, MD  ?  ?  ?Future Appointments   ?        ? In 1 week Glean Hess, MD [facility], [place]  ?  ? ?  ?  ?  ? ?

## 2022-03-06 ENCOUNTER — Other Ambulatory Visit: Payer: Self-pay | Admitting: Internal Medicine

## 2022-03-07 ENCOUNTER — Encounter: Payer: Self-pay | Admitting: Internal Medicine

## 2022-03-07 ENCOUNTER — Ambulatory Visit (INDEPENDENT_AMBULATORY_CARE_PROVIDER_SITE_OTHER): Payer: Medicare Other | Admitting: Internal Medicine

## 2022-03-07 VITALS — BP 102/60 | HR 78 | Ht 69.0 in | Wt 183.0 lb

## 2022-03-07 DIAGNOSIS — I10 Essential (primary) hypertension: Secondary | ICD-10-CM | POA: Diagnosis not present

## 2022-03-07 DIAGNOSIS — E1169 Type 2 diabetes mellitus with other specified complication: Secondary | ICD-10-CM

## 2022-03-07 DIAGNOSIS — E118 Type 2 diabetes mellitus with unspecified complications: Secondary | ICD-10-CM | POA: Diagnosis not present

## 2022-03-07 DIAGNOSIS — E785 Hyperlipidemia, unspecified: Secondary | ICD-10-CM

## 2022-03-07 NOTE — Progress Notes (Signed)
? ? ?Date:  03/07/2022  ? ?Name:  Cathy Murphy   DOB:  September 12, 1941   MRN:  [id number] ? ? ?Chief Complaint: Hypertension and Diabetes ? ?Hypertension ?This is a chronic problem. The current episode started more than 1 year ago. The problem is unchanged. The problem is controlled. Pertinent negatives include no chest pain, headaches, palpitations or shortness of breath. Past treatments include ACE inhibitors. The current treatment provides significant improvement. Hypertensive end-organ damage includes kidney disease. There is no history of CAD/MI or CVA.  ?Diabetes ?She presents for her follow-up diabetic visit. She has type 2 diabetes mellitus. Pertinent negatives for hypoglycemia include no headaches or tremors. Pertinent negatives for diabetes include no chest pain, no fatigue, no polydipsia and no polyuria. Pertinent negatives for diabetic complications include no CVA. Current diabetic treatment includes insulin injections (glipizide and actos amd [place]). She is compliant with treatment all of the time. Her weight is stable.  ? ?Lab Results  ?Component Value Date  ? NA 140 08/29/2021  ? K 3.9 08/29/2021  ? CO2 24 08/29/2021  ? GLUCOSE 158 (H) 08/29/2021  ? BUN 17 08/29/2021  ? CREATININE 1.28 (H) 08/29/2021  ? CALCIUM 9.2 08/29/2021  ? EGFR 43 (L) 08/29/2021  ? GFRNONAA 38 (L) 08/16/2020  ? ?Lab Results  ?Component Value Date  ? CHOL 159 08/29/2021  ? HDL 58 08/29/2021  ? [place] 81 08/29/2021  ? TRIG 110 08/29/2021  ? CHOLHDL 2.7 08/29/2021  ? ?Lab Results  ?Component Value Date  ? TSH 2.320 08/29/2021  ? ?Lab Results  ?Component Value Date  ? HGBA1C 10.0 (A) 12/06/2021  ? ?Lab Results  ?Component Value Date  ? WBC 6.5 08/29/2021  ? HGB 14.2 08/29/2021  ? HCT 44.6 08/29/2021  ? MCV 88 08/29/2021  ? PLT 182 08/29/2021  ? ?Lab Results  ?Component Value Date  ? ALT 13 08/29/2021  ? AST 17 08/29/2021  ? ALKPHOS 67 08/29/2021  ? BILITOT 1.5 (H) 08/29/2021  ? ?Lab Results  ?Component Value Date  ? VD25OH 12.8 (L)  06/15/2015  ?  ? ?Review of Systems  ?Constitutional:  Negative for appetite change, fatigue, fever and unexpected weight change.  ?HENT:  Negative for tinnitus and trouble swallowing.   ?Eyes:  Positive for visual disturbance.  ?Respiratory:  Negative for cough, chest tightness and shortness of breath.   ?Cardiovascular:  Negative for chest pain, palpitations and leg swelling.  ?Gastrointestinal:  Negative for abdominal pain.  ?Endocrine: Negative for polydipsia and polyuria.  ?Genitourinary:  Negative for dysuria and hematuria.  ?Musculoskeletal:  Negative for arthralgias.  ?Neurological:  Negative for tremors, numbness and headaches.  ?Psychiatric/Behavioral:  Negative for dysphoric mood.   ? ?Patient Active Problem List  ? Diagnosis Date Noted  ? Syncope 12/06/2021  ? Difficulty sleeping 12/29/2020  ? Mild cognitive impairment 12/29/2020  ? Headache disorder 12/29/2020  ? Stage 3a chronic kidney disease ([place]) 08/16/2020  ? Osteopenia determined by x-ray 08/05/2019  ? Atherosclerosis of abdominal aorta ([place]) 06/09/2019  ? Coronary artery disease involving native coronary artery of native heart 06/09/2019  ? BMI 24.0-24.9, adult 11/06/2018  ? Bilateral neck pain 10/08/2018  ? Aortic calcification ([place]) 03/05/2018  ? 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 Ventral hernia without obstruction or gangrene 03/06/2016  ? Type II diabetes mellitus with complication ([place]) 40/06/6760  ? Plantar fasciitis, right 06/16/2015  ? Knee torn cartilage 04/26/2015  ? Allergic rhinitis, seasonal 04/26/2015  ? Essential (primary) hypertension 04/26/2015  ? Glaucoma 04/26/2015  ?  Hyperlipidemia associated with type 2 diabetes mellitus ([place]) 04/26/2015  ? Insomnia 04/26/2015  ? Osteoarthritis of right knee 05/03/2014  ? ? ?Allergies  ?Allergen Reactions  ? Alprazolam Nausea Only  ? Amoxicillin Diarrhea  ? Aricept [Donepezil] Other (See Comments)  ?  Leg cramps  ? Aspirin Nausea And Vomiting  ? Metformin And Related Diarrhea  ? Oxycodone  Other (See Comments)  ?  CNS side effects/agitation  ? Riomet [Metformin Hcl] Nausea And Vomiting  ? Sulfa Antibiotics   ? Hydrocodone Rash  ? Nabumetone Rash  ? Skelaxin [Metaxalone] Rash  ? ? ?Past Surgical History:  ?Procedure Laterality Date  ? ABDOMINAL HYSTERECTOMY    ? CATARACT EXTRACTION W/PHACO Left 10/01/2021  ? Procedure: CATARACT EXTRACTION PHACO AND INTRAOCULAR LENS PLACEMENT ([place]) LEFT HYDRUS MICROSTENT DIABETIC;  Surgeon: Eulogio Bear, MD;  Location: [place];  Service: Ophthalmology;  Laterality: Left;  Diabetic ?7.49 ?00:42.1  ? CATARACT EXTRACTION W/PHACO Right 10/15/2021  ? Procedure: PHACO EMULSIFICATION  with HYDRUS MICROSTENT iMPLANTATION;  Surgeon: Eulogio Bear, MD;  Location: [place];  Service: Ophthalmology;  Laterality: Right;  Diabetic ?4.98 ?00:27.3  ? COLONOSCOPY WITH PROPOFOL N/A 11/25/2017  ? Procedure: COLONOSCOPY WITH PROPOFOL;  Surgeon: [name], MD;  Location: [facility] ENDOSCOPY;  Service: Endoscopy;  Laterality: N/A;  ? EYE SURGERY    ? LIPOMA RESECTION    ? right posterior neck  ? ? ?Social History  ? ?Tobacco Use  ? Smoking status: Former  ?  Packs/day: 1.00  ?  Years: 7.00  ?  Pack years: 7.00  ?  Types: Cigarettes  ?  Quit date: 38  ?  Years since quitting: 58.3  ? Smokeless tobacco: Never  ? Tobacco comments:  ?  smoking cessation materials not required  ?Vaping Use  ? Vaping Use: Never used  ?Substance Use Topics  ? Alcohol use: No  ? Drug use: No  ? ? ? ?Medication list has been reviewed and updated. ? ?Current Meds  ?Medication Sig  ? B-D UF III MINI PEN NEEDLES 31G X 5 MM MISC USE AS DIRECTED WITH LEVEMIR  ? cholecalciferol (VITAMIN D) 1000 units tablet Take 2,000 Units by mouth daily.  ? Cholecalciferol 25 MCG (1000 UT) tablet Take by mouth.  ? diclofenac (VOLTAREN) 75 MG EC tablet Take 1 tablet (75 mg total) by mouth 2 (two) times daily.  ? ezetimibe (ZETIA) 10 MG tablet Take 10 mg by mouth daily.  ? glipiZIDE (GLUCOTROL) 5 MG  tablet Take 1 tablet (5 mg total) by mouth 2 (two) times daily.  ? glucose blood test strip Use to test blood sugar up to twice daily.  ? hydrOXYzine (ATARAX/VISTARIL) 25 MG tablet Take 0.5-1 tablets (12.5-25 mg total) by mouth every 8 (eight) hours as needed for anxiety.  ? insulin glargine (LANTUS SOLOSTAR) 100 UNIT/ML Solostar Pen Inject 30 Units into the skin every morning AND 20 Units every evening. (Patient taking differently: Inject 30 Units into the skin every morning)  ? JANUVIA 100 MG tablet TAKE 1 TABLET BY MOUTH EVERY DAY  ? lisinopril (ZESTRIL) 40 MG tablet Take 1 tablet (40 mg total) by mouth daily.  ? pioglitazone (ACTOS) 15 MG tablet TAKE 1 TABLET BY MOUTH EVERY DAY  ? prednisoLONE acetate (PRED FORTE) 1 % ophthalmic suspension Place 2 drops into both eyes 2 (two) times daily.  ? rosuvastatin (CRESTOR) 40 MG tablet TAKE 1 TABLET BY MOUTH EVERY DAY  ? SIMBRINZA 1-0.2 % SUSP Apply 1  drop to eye 2 (two) times daily.  ? spironolactone (ALDACTONE) 25 MG tablet Take 1 tablet (25 mg total) by mouth daily.  ? traMADol (ULTRAM) 50 MG tablet Take 1 tablet (50 mg total) by mouth every 6 (six) hours as needed.  ? traZODone (DESYREL) 50 MG tablet TAKE 1 TABLET BY MOUTH EVERYDAY AT BEDTIME  ? ? ? ?  01/18/2022  ?  1:41 PM 12/06/2021  ? 11:10 AM 10/12/2021  ? 11:04 AM 08/29/2021  ? 10:42 AM  ?GAD 7 : Generalized Anxiety Score  ?Nervous, Anxious, on Edge _0 ?Control/stop worrying 1 0 0 1  ?Worry too much - different things 1 0 0 1  ?Trouble relaxing _1 ?Restless 1 0 0 1  ?Easily annoyed or irritable 1 0 0 1  ?Afraid - awful might happen 1 0 0 1  ?Total GAD 7 Score _2 ?Anxiety Difficulty Not difficult at all  Not difficult at all Not difficult at all  ? ? ? ?  01/18/2022  ?  1:41 PM  ?Depression screen PHQ 2/9  ?Decreased Interest 1  ?Down, Depressed, Hopeless 1  ?PHQ - 2 Score 2  ?Altered sleeping 1  ?Tired, decreased energy 1  ?Change in appetite 1  ?Feeling bad or failure about yourself  1  ?Trouble  concentrating 1  ?Moving slowly or fidgety/restless 0  ?Suicidal thoughts 0  ?PHQ-9 Score 7  ?Difficult doing work/chores Not difficult at all  ? ? ?BP Readings from Last 3 Encounters:  ?03/07/22 102/60  ?0

## 2022-03-08 ENCOUNTER — Encounter: Payer: Self-pay | Admitting: Internal Medicine

## 2022-03-08 LAB — BASIC METABOLIC PANEL
BUN/Creatinine Ratio: 15 (ref 12–28)
BUN: 23 mg/dL (ref 8–27)
CO2: 23 mmol/L (ref 20–29)
Calcium: 9.1 mg/dL (ref 8.7–10.3)
Chloride: 104 mmol/L (ref 96–106)
Creatinine, Ser: 1.49 mg/dL — ABNORMAL HIGH (ref 0.57–1.00)
Glucose: 116 mg/dL — ABNORMAL HIGH (ref 70–99)
Potassium: 4.1 mmol/L (ref 3.5–5.2)
Sodium: 140 mmol/L (ref 134–144)
eGFR: 36 mL/min/{1.73_m2} — ABNORMAL LOW (ref >59)

## 2022-03-08 LAB — HEMOGLOBIN A1C
Est. average glucose Bld gHb Est-mCnc: 186 mg/dL
Hgb A1c MFr Bld: 8.1 % — ABNORMAL HIGH (ref 4.8–5.6)

## 2022-03-18 ENCOUNTER — Other Ambulatory Visit: Payer: Self-pay | Admitting: Internal Medicine

## 2022-03-18 DIAGNOSIS — F419 Anxiety disorder, unspecified: Secondary | ICD-10-CM

## 2022-03-20 NOTE — Telephone Encounter (Signed)
Requested Prescriptions  ?Pending Prescriptions Disp Refills  ?? hydrOXYzine (ATARAX) 25 MG tablet [Pharmacy Med Name: HYDROXYZINE HCL 25 MG TABLET] 30 tablet 5  ?  Sig: TAKE 0.5-1 TABLETS (12.5-25 MG TOTAL) BY MOUTH EVERY 8 (EIGHT) HOURS AS NEEDED FOR ANXIETY.  ?  ? Ear, Nose, and Throat:  Antihistamines 2 Failed - 03/18/2022  5:56 PM  ?  ?  Failed - Cr in normal range and within 360 days  ?  Creatinine  ?Date Value Ref Range Status  ?07/22/2012 0.96 0.60 - 1.30 mg/dL Final  ? ?Creatinine, Ser  ?Date Value Ref Range Status  ?03/07/2022 1.49 (H) 0.57 - 1.00 mg/dL Final  ?   ?  ?  Passed - Valid encounter within last 12 months  ?  Recent Outpatient Visits   ?      ? 1 week ago Essential (primary) hypertension  ? [facility] Glean Hess, MD  ? 2 months ago Type II diabetes mellitus with complication [facility])  ? [facility] Glean Hess, MD  ? 3 months ago Type II diabetes mellitus with complication [facility])  ? [facility] Glean Hess, MD  ? 5 months ago Syncope, unspecified syncope type  ? [facility] Glean Hess, MD  ? 6 months ago Annual physical exam  ? [facility] Glean Hess, MD  ?  ?  ?Future Appointments   ?        ? In 3 months Army Melia Jesse Sans, MD [facility], [place]  ?  ? ?  ?  ?  ? ?

## 2022-03-22 DIAGNOSIS — H209 Unspecified iridocyclitis: Secondary | ICD-10-CM | POA: Diagnosis not present

## 2022-03-25 DIAGNOSIS — S83242A Other tear of medial meniscus, current injury, left knee, initial encounter: Secondary | ICD-10-CM | POA: Diagnosis not present

## 2022-03-25 DIAGNOSIS — M7051 Other bursitis of knee, right knee: Secondary | ICD-10-CM | POA: Diagnosis not present

## 2022-03-25 DIAGNOSIS — M948X6 Other specified disorders of cartilage, lower leg: Secondary | ICD-10-CM | POA: Diagnosis not present

## 2022-03-25 DIAGNOSIS — M25462 Effusion, left knee: Secondary | ICD-10-CM | POA: Diagnosis not present

## 2022-03-29 DIAGNOSIS — M1712 Unilateral primary osteoarthritis, left knee: Secondary | ICD-10-CM | POA: Diagnosis not present

## 2022-03-29 DIAGNOSIS — M25562 Pain in left knee: Secondary | ICD-10-CM | POA: Diagnosis not present

## 2022-03-29 DIAGNOSIS — G8929 Other chronic pain: Secondary | ICD-10-CM | POA: Diagnosis not present

## 2022-04-12 ENCOUNTER — Other Ambulatory Visit: Payer: Self-pay | Admitting: Internal Medicine

## 2022-04-12 DIAGNOSIS — E1169 Type 2 diabetes mellitus with other specified complication: Secondary | ICD-10-CM

## 2022-04-12 NOTE — Telephone Encounter (Signed)
Requested Prescriptions  Pending Prescriptions Disp Refills  . rosuvastatin (CRESTOR) 40 MG tablet [Pharmacy Med Name: ROSUVASTATIN CALCIUM 40 MG TAB] 90 tablet 1    Sig: TAKE 1 TABLET BY MOUTH EVERY DAY     Cardiovascular:  Antilipid - Statins 2 Failed - 04/12/2022  2:11 AM      Failed - Cr in normal range and within 360 days    Creatinine  Date Value Ref Range Status  07/22/2012 0.96 0.60 - 1.30 mg/dL Final   Creatinine, Ser  Date Value Ref Range Status  03/07/2022 1.49 (H) 0.57 - 1.00 mg/dL Final         Failed - Lipid Panel in normal range within the last 12 months    Cholesterol, Total  Date Value Ref Range Status  08/29/2021 159 100 - 199 mg/dL Final   LDL Chol Calc (NIH)  Date Value Ref Range Status  08/29/2021 81 0 - 99 mg/dL Final   HDL  Date Value Ref Range Status  08/29/2021 58 >39 mg/dL Final   Triglycerides  Date Value Ref Range Status  08/29/2021 110 0 - 149 mg/dL Final         Passed - Patient is not pregnant      Passed - Valid encounter within last 12 months    Recent Outpatient Visits          1 month ago Essential (primary) hypertension   [facility] Glean Hess, MD   2 months ago Type II diabetes mellitus with complication [facility])   [facility] Glean Hess, MD   4 months ago Type II diabetes mellitus with complication [facility])   [facility] Glean Hess, MD   6 months ago Syncope, unspecified syncope type   [facility] Glean Hess, MD   7 months ago Annual physical exam   [facility] Glean Hess, MD      Future Appointments            In 3 months Army Melia Jesse Sans, MD [facility], [facility]

## 2022-04-23 DIAGNOSIS — M1712 Unilateral primary osteoarthritis, left knee: Secondary | ICD-10-CM | POA: Diagnosis not present

## 2022-05-01 DIAGNOSIS — H59031 Cystoid macular edema following cataract surgery, right eye: Secondary | ICD-10-CM | POA: Diagnosis not present

## 2022-05-01 LAB — HM DIABETES EYE EXAM

## 2022-05-03 ENCOUNTER — Encounter: Payer: Self-pay | Admitting: Internal Medicine

## 2022-05-13 ENCOUNTER — Other Ambulatory Visit: Payer: Self-pay | Admitting: Internal Medicine

## 2022-05-13 DIAGNOSIS — Z794 Long term (current) use of insulin: Secondary | ICD-10-CM

## 2022-05-14 NOTE — Telephone Encounter (Signed)
Requested Prescriptions  Pending Prescriptions Disp Refills  . pioglitazone (ACTOS) 15 MG tablet [Pharmacy Med Name: PIOGLITAZONE HCL 15 MG TABLET] 90 tablet 0    Sig: TAKE 1 TABLET BY MOUTH EVERY DAY     Endocrinology:  Diabetes - Glitazones - pioglitazone Failed - 05/13/2022  1:11 PM      Failed - HBA1C is between 0 and 7.9 and within 180 days    HbA1c, POC (prediabetic range)  Date Value Ref Range Status  12/11/2020 0 (A) 5.7 - 6.4 % Final   HbA1c, POC (controlled diabetic range)  Date Value Ref Range Status  12/11/2020 0.0 0.0 - 7.0 % Final   HbA1c POC (<> result, manual entry)  Date Value Ref Range Status  12/11/2020 0 4.0 - 5.6 % Final   Hgb A1c MFr Bld  Date Value Ref Range Status  03/07/2022 8.1 (H) 4.8 - 5.6 % Final    Comment:             Prediabetes: 5.7 - 6.4          Diabetes: >6.4          Glycemic control for adults with diabetes: <7.0          Passed - Valid encounter within last 6 months    Recent Outpatient Visits          2 months ago Essential (primary) hypertension   [facility] Glean Hess, MD   3 months ago Type II diabetes mellitus with complication [facility])   [facility] Glean Hess, MD   5 months ago Type II diabetes mellitus with complication [facility])   [facility] Glean Hess, MD   7 months ago Syncope, unspecified syncope type   [facility] Glean Hess, MD   8 months ago Annual physical exam   [facility] Glean Hess, MD      Future Appointments            In 1 month Army Melia, Jesse Sans, MD [facility], [facility]

## 2022-05-17 DIAGNOSIS — H209 Unspecified iridocyclitis: Secondary | ICD-10-CM | POA: Diagnosis not present

## 2022-05-27 DIAGNOSIS — H353211 Exudative age-related macular degeneration, right eye, with active choroidal neovascularization: Secondary | ICD-10-CM | POA: Diagnosis not present

## 2022-06-13 DIAGNOSIS — M1712 Unilateral primary osteoarthritis, left knee: Secondary | ICD-10-CM | POA: Diagnosis not present

## 2022-06-25 DIAGNOSIS — M1712 Unilateral primary osteoarthritis, left knee: Secondary | ICD-10-CM | POA: Diagnosis not present

## 2022-06-25 DIAGNOSIS — E1169 Type 2 diabetes mellitus with other specified complication: Secondary | ICD-10-CM | POA: Diagnosis not present

## 2022-06-25 LAB — HEMOGLOBIN A1C: Hemoglobin A1C: 6.4

## 2022-07-02 DIAGNOSIS — I25118 Atherosclerotic heart disease of native coronary artery with other forms of angina pectoris: Secondary | ICD-10-CM | POA: Diagnosis not present

## 2022-07-02 DIAGNOSIS — E785 Hyperlipidemia, unspecified: Secondary | ICD-10-CM | POA: Diagnosis not present

## 2022-07-02 DIAGNOSIS — I10 Essential (primary) hypertension: Secondary | ICD-10-CM | POA: Diagnosis not present

## 2022-07-02 DIAGNOSIS — I2510 Atherosclerotic heart disease of native coronary artery without angina pectoris: Secondary | ICD-10-CM | POA: Diagnosis not present

## 2022-07-02 DIAGNOSIS — E118 Type 2 diabetes mellitus with unspecified complications: Secondary | ICD-10-CM | POA: Diagnosis not present

## 2022-07-09 DIAGNOSIS — H35351 Cystoid macular degeneration, right eye: Secondary | ICD-10-CM | POA: Diagnosis not present

## 2022-07-11 ENCOUNTER — Encounter: Payer: Self-pay | Admitting: Internal Medicine

## 2022-07-11 ENCOUNTER — Ambulatory Visit (INDEPENDENT_AMBULATORY_CARE_PROVIDER_SITE_OTHER): Payer: Medicare Other | Admitting: Internal Medicine

## 2022-07-11 VITALS — BP 126/78 | HR 64 | Ht 69.0 in | Wt 182.0 lb

## 2022-07-11 DIAGNOSIS — M1711 Unilateral primary osteoarthritis, right knee: Secondary | ICD-10-CM

## 2022-07-11 DIAGNOSIS — I10 Essential (primary) hypertension: Secondary | ICD-10-CM | POA: Diagnosis not present

## 2022-07-11 DIAGNOSIS — E118 Type 2 diabetes mellitus with unspecified complications: Secondary | ICD-10-CM

## 2022-07-11 MED ORDER — LISINOPRIL 40 MG PO TABS: 40.0000 mg | ORAL_TABLET | Freq: Every day | ORAL | 1 refills | Status: DC

## 2022-07-11 MED ORDER — GLIPIZIDE 5 MG PO TABS: 5.0000 mg | ORAL_TABLET | Freq: Two times a day (BID) | ORAL | 1 refills | Status: DC

## 2022-07-11 NOTE — Progress Notes (Signed)
ch   Date:  07/11/2022   Name:  Cathy Murphy   DOB:  21-Jul-1941   MRN:  [id number]   Chief Complaint: Diabetes and Hypertension  Diabetes She presents for her follow-up diabetic visit. She has type 2 diabetes mellitus. Her disease course has been stable. Pertinent negatives for hypoglycemia include no headaches or tremors. Pertinent negatives for diabetes include no chest pain, no fatigue, no polydipsia and no polyuria. Current diabetic treatment includes insulin injections (actos, januvia, glipizide). An ACE inhibitor/angiotensin II receptor blocker is being taken. Eye exam is current.  Hypertension This is a chronic problem. The problem is controlled. Pertinent negatives include no chest pain, headaches, palpitations or shortness of breath. Past treatments include ACE inhibitors and diuretics. The current treatment provides moderate improvement.    Lab Results  Component Value Date   NA 140 03/07/2022   K 4.1 03/07/2022   CO2 23 03/07/2022   GLUCOSE 116 (H) 03/07/2022   BUN 23 03/07/2022   CREATININE 1.49 (H) 03/07/2022   CALCIUM 9.1 03/07/2022   EGFR 36 (L) 03/07/2022   GFRNONAA 38 (L) 08/16/2020   Lab Results  Component Value Date   CHOL 159 08/29/2021   HDL 58 08/29/2021   LDLCALC 81 08/29/2021   TRIG 110 08/29/2021   CHOLHDL 2.7 08/29/2021   Lab Results  Component Value Date   TSH 2.320 08/29/2021   Lab Results  Component Value Date   HGBA1C 6.4 06/25/2022   Lab Results  Component Value Date   WBC 6.5 08/29/2021   HGB 14.2 08/29/2021   HCT 44.6 08/29/2021   MCV 88 08/29/2021   PLT 182 08/29/2021   Lab Results  Component Value Date   ALT 13 08/29/2021   AST 17 08/29/2021   ALKPHOS 67 08/29/2021   BILITOT 1.5 (H) 08/29/2021   Lab Results  Component Value Date   VD25OH 12.8 (L) 06/15/2015     Review of Systems  Constitutional:  Negative for appetite change, fatigue, fever and unexpected weight change.  HENT:  Negative for tinnitus and trouble  swallowing.   Eyes:  Negative for visual disturbance.  Respiratory:  Negative for cough, chest tightness and shortness of breath.   Cardiovascular:  Negative for chest pain, palpitations and leg swelling.  Gastrointestinal:  Negative for abdominal pain.  Endocrine: Negative for polydipsia and polyuria.  Genitourinary:  Negative for dysuria and hematuria.  Musculoskeletal:  Negative for arthralgias.  Neurological:  Negative for tremors, numbness and headaches.  Psychiatric/Behavioral:  Negative for dysphoric mood.     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   Ventral hernia without obstruction or gangrene 03/06/2016   Type II diabetes mellitus with complication ([place]) 16/11/930   Plantar fasciitis, right 06/16/2015   Knee torn cartilage 04/26/2015   Allergic rhinitis, seasonal 04/26/2015   Essential (primary) hypertension 04/26/2015   Glaucoma 04/26/2015   Hyperlipidemia associated with type 2 diabetes mellitus ([place]) 04/26/2015   Insomnia 04/26/2015   Osteoarthritis of right knee 05/03/2014    Allergies  Allergen Reactions   Farxiga [Dapagliflozin] Itching    Genital yeast infection   Alprazolam Nausea Only   Amoxicillin Diarrhea   Aricept [Donepezil] Other (See Comments)    Leg cramps   Aspirin Nausea And Vomiting   Metformin And Related Diarrhea   Oxycodone Other (See Comments)    CNS side effects/agitation   Riomet [Metformin Hcl] Nausea And Vomiting   Sulfa  Antibiotics    Hydrocodone Rash   Nabumetone Rash   Skelaxin [Metaxalone] Rash    Past Surgical History:  Procedure Laterality Date   ABDOMINAL HYSTERECTOMY     CATARACT EXTRACTION W/PHACO Left 10/01/2021   Procedure: CATARACT EXTRACTION PHACO AND INTRAOCULAR LENS PLACEMENT ([place]) LEFT HYDRUS MICROSTENT DIABETIC;  Surgeon: Eulogio Bear, MD;  Location: [place];  Service: Ophthalmology;  Laterality: Left;  Diabetic 7.49 00:42.1   CATARACT EXTRACTION W/PHACO Right 10/15/2021   Procedure: PHACO EMULSIFICATION  with HYDRUS MICROSTENT iMPLANTATION;  Surgeon: Eulogio Bear, MD;  Location: [place];  Service: Ophthalmology;  Laterality: Right;  Diabetic 4.98 00:27.3   COLONOSCOPY WITH PROPOFOL N/A 11/25/2017   Procedure: COLONOSCOPY WITH PROPOFOL;  Surgeon: [name], MD;  Location: [facility] ENDOSCOPY;  Service: Endoscopy;  Laterality: N/A;   EYE SURGERY     LIPOMA RESECTION     right posterior neck    Social History   Tobacco Use   Smoking status: Former    Packs/day: 1.00    Years: 7.00    Total pack years: 7.00    Types: Cigarettes    Quit date: 1965    Years since quitting: 58.7   Smokeless tobacco: Never   Tobacco comments:    smoking cessation materials not required  Vaping Use   Vaping Use: Never used  Substance Use Topics   Alcohol use: No   Drug use: No     Medication list has been reviewed and updated.  Current Meds  Medication Sig   B-D UF III MINI PEN NEEDLES 31G X 5 MM MISC USE AS DIRECTED WITH LEVEMIR   cholecalciferol (VITAMIN D) 1000 units tablet Take 2,000 Units by mouth daily.   Cholecalciferol 25 MCG (1000 UT) tablet Take by mouth.   diclofenac (VOLTAREN) 75 MG EC tablet Take 1 tablet (75 mg total) by mouth 2 (two) times daily.   ezetimibe (ZETIA) 10 MG tablet Take 10 mg by mouth daily.   glipiZIDE (GLUCOTROL) 5 MG tablet Take 1 tablet (5 mg total) by mouth 2 (two) times daily.   glucose blood test strip Use to  test blood sugar up to twice daily.   hydrOXYzine (ATARAX) 25 MG tablet TAKE 0.5-1 TABLETS (12.5-25 MG TOTAL) BY MOUTH EVERY 8 (EIGHT) HOURS AS NEEDED FOR ANXIETY.   insulin glargine (LANTUS SOLOSTAR) 100 UNIT/ML Solostar Pen Inject 30 Units into the skin every morning AND 20 Units every evening. (Patient taking differently: Inject 30 Units into the skin every morning)   JANUVIA 100 MG tablet TAKE 1 TABLET BY MOUTH EVERY DAY   lisinopril (ZESTRIL) 40 MG tablet Take 1 tablet (40 mg total) by mouth daily.   pioglitazone (ACTOS) 15 MG tablet TAKE 1 TABLET BY MOUTH EVERY DAY   prednisoLONE acetate (PRED FORTE) 1 % ophthalmic suspension Place 2 drops into both eyes 2 (two) times daily.   rosuvastatin (CRESTOR) 40 MG tablet TAKE 1 TABLET BY MOUTH EVERY DAY   spironolactone (ALDACTONE) 25 MG tablet Take 1 tablet (25 mg total) by mouth daily.   traZODone (DESYREL) 50  MG tablet TAKE 1 TABLET BY MOUTH EVERYDAY AT BEDTIME       07/11/2022   10:45 AM 03/07/2022   10:22 AM 01/18/2022    1:41 PM 12/06/2021   11:10 AM  GAD 7 : Generalized Anxiety Score  Nervous, Anxious, on Edge 0 $Remo'1 1 1  'wfUIl$ Control/stop worrying 0 1 1 0  Worry too much - different things 0 1 1 0  Trouble relaxing 0 $RemoveBe'1 2 1  'DMJloPeYp$ Restless 0 1 1 0  Easily annoyed or irritable 0 0 1 0  Afraid - awful might happen 0 0 1 0  Total GAD 7 Score 0 $Remov'5 8 2  'euQOxt$ Anxiety Difficulty Not difficult at all Not difficult at all Not difficult at all        07/11/2022   10:44 AM 03/07/2022   10:22 AM 01/18/2022    1:41 PM  Depression screen PHQ 2/9  Decreased Interest 0 1 1  Down, Depressed, Hopeless 0 0 1  PHQ - 2 Score 0 1 2  Altered sleeping 0 1 1  Tired, decreased energy 0 1 1  Change in appetite 0 0 1  Feeling bad or failure about yourself  0 0 1  Trouble concentrating 0 0 1  Moving slowly or fidgety/restless 0 0 0  Suicidal thoughts 0 0 0  PHQ-9 Score 0 3 7  Difficult doing work/chores Not difficult at all Not difficult at all Not difficult at all    BP  Readings from Last 3 Encounters:  07/11/22 126/78  03/07/22 102/60  01/18/22 132/74    Physical Exam Vitals and nursing note reviewed.  Constitutional:      General: She is not in acute distress.    Appearance: Normal appearance. She is well-developed.  HENT:     Head: Normocephalic and atraumatic.  Eyes:     Conjunctiva/sclera:     Right eye: Right conjunctiva is injected.     Comments: S/p eye injection yesterday  Cardiovascular:     Rate and Rhythm: Normal rate and regular rhythm.  Pulmonary:     Effort: Pulmonary effort is normal. No respiratory distress.     Breath sounds: No wheezing or rhonchi.  Musculoskeletal:     Cervical back: Normal range of motion.     Right knee: Bony tenderness present. Decreased range of motion.     Left knee: Bony tenderness present. Decreased range of motion.     Right lower leg: No edema.     Left lower leg: No edema.  Skin:    General: Skin is warm and dry.     Findings: No rash.  Neurological:     Mental Status: She is alert and oriented to person, place, and time.  Psychiatric:        Mood and Affect: Mood normal.        Behavior: Behavior normal.     Wt Readings from Last 3 Encounters:  07/11/22 182 lb (82.6 kg)  03/07/22 183 lb (83 kg)  01/18/22 183 lb 6.4 oz (83.2 kg)    BP 126/78 (BP Location: Left Arm, Cuff Size: Normal)   Pulse 64   Ht $R'5\' 9"'RH$  (1.753 m)   Wt 182 lb (82.6 kg)   SpO2 99%   BMI 26.88 kg/m   Assessment and Plan: 1. Type II diabetes mellitus with complication (HCC) Clinically stable by exam and report without s/s of hypoglycemia. DM complicated by hypertension and dyslipidemia. Tolerating medications well without side effects or other concerns. - glipiZIDE (  GLUCOTROL) 5 MG tablet; Take 1 tablet (5 mg total) by mouth 2 (two) times daily.  Dispense: 180 tablet; Refill: 1  2. Essential (primary) hypertension Clinically stable exam with well controlled BP. Tolerating medications without side effects at  this time. Pt to continue current regimen and low sodium diet; benefits of regular exercise as able discussed. - lisinopril (ZESTRIL) 40 MG tablet; Take 1 tablet (40 mg total) by mouth daily.  Dispense: 90 tablet; Refill: 1  3. Primary osteoarthritis of right knee Planning TKA in the near future.  Pre-op clearance form completed and will be faxed.   Partially dictated using Editor, commissioning. Any errors are unintentional.  Halina Maidens, MD [facility]  07/11/2022

## 2022-07-16 NOTE — Progress Notes (Unsigned)
Subjective:   Cathy Murphy is a 81 y.o. female who presents for Medicare Annual (Subsequent) preventive examination.  I connected with  [name] on 07/17/22 by an in person visit and verified that I am speaking with the correct person using two identifiers.  Patient Location: Other:  In office  Provider Location: Office/Clinic   Review of Systems    Defer to PCP Cardiac Risk Factors include: advanced age (>14mn, >>22women)     Objective:    Today's Vitals   07/17/22 0957 07/17/22 1001  BP: 122/68   Pulse: 82   Weight: 182 lb (82.6 kg)   Height: '5\' 9"'$  (1.753 m)   PainSc: 0-No pain 0-No pain   Body mass index is 26.88 kg/m.     07/17/2022   10:03 AM 01/14/2022    9:41 AM 10/19/2021    1:27 PM 10/15/2021    9:02 AM 10/01/2021    7:43 AM 07/16/2021   10:30 AM 07/12/2020   10:25 AM  Advanced Directives  Does Patient Have a Medical Advance Directive? No No No Yes Yes No No  Type of ATheatre stage managerof AMariano ColanLiving will HTollesonLiving will    Does patient want to make changes to medical advance directive?    No - Patient declined No - Patient declined    Copy of HParkmanin Chart?    No - copy requested Yes - validated most recent copy scanned in chart (See row information)    Would patient like information on creating a medical advance directive? No - Patient declined No - Patient declined    No - Patient declined No - Patient declined    Current Medications (verified) Outpatient Encounter Medications as of 07/17/2022  Medication Sig   B-D UF III MINI PEN NEEDLES 31G X 5 MM MISC USE AS DIRECTED WITH LEVEMIR   cholecalciferol (VITAMIN D) 1000 units tablet Take 2,000 Units by mouth daily.   Cholecalciferol 25 MCG (1000 UT) tablet Take by mouth.   diclofenac (VOLTAREN) 75 MG EC tablet Take 1 tablet (75 mg total) by mouth 2 (two) times daily.   ezetimibe (ZETIA) 10 MG tablet Take 10 mg by mouth daily.    glipiZIDE (GLUCOTROL) 5 MG tablet Take 1 tablet (5 mg total) by mouth 2 (two) times daily.   glucose blood test strip Use to test blood sugar up to twice daily.   hydrOXYzine (ATARAX) 25 MG tablet TAKE 0.5-1 TABLETS (12.5-25 MG TOTAL) BY MOUTH EVERY 8 (EIGHT) HOURS AS NEEDED FOR ANXIETY.   insulin glargine (LANTUS SOLOSTAR) 100 UNIT/ML Solostar Pen Inject 30 Units into the skin every morning AND 20 Units every evening. (Patient taking differently: Inject 30 Units into the skin every morning)   JANUVIA 100 MG tablet TAKE 1 TABLET BY MOUTH EVERY DAY   lisinopril (ZESTRIL) 40 MG tablet Take 1 tablet (40 mg total) by mouth daily.   pioglitazone (ACTOS) 15 MG tablet TAKE 1 TABLET BY MOUTH EVERY DAY   prednisoLONE acetate (PRED FORTE) 1 % ophthalmic suspension Place 2 drops into both eyes 2 (two) times daily.   rosuvastatin (CRESTOR) 40 MG tablet TAKE 1 TABLET BY MOUTH EVERY DAY   spironolactone (ALDACTONE) 25 MG tablet Take 1 tablet (25 mg total) by mouth daily.   traZODone (DESYREL) 50 MG tablet TAKE 1 TABLET BY MOUTH EVERYDAY AT BEDTIME   [DISCONTINUED] albuterol (PROAIR HFA) 108 (90 Base) MCG/ACT inhaler Inhale 2  puffs into the lungs every 6 (six) hours as needed for wheezing or shortness of breath.   No facility-administered encounter medications on file as of 07/17/2022.    Allergies (verified) Farxiga [dapagliflozin], Alprazolam, Amoxicillin, Aricept [donepezil], Aspirin, Metformin and related, Oxycodone, Riomet [metformin hcl], Sulfa antibiotics, Hydrocodone, Nabumetone, and Skelaxin [metaxalone]   History: Past Medical History:  Diagnosis Date   Allergy    Anxiety    COVID-19 09/08/2021   Fever, chills, sneezing, runny nose.  Resolved.   Diabetes mellitus without complication ([place])    Glaucoma    Hyperlipidemia    Hypertension    Past Surgical History:  Procedure Laterality Date   ABDOMINAL HYSTERECTOMY     CATARACT EXTRACTION W/PHACO Left 10/01/2021   Procedure: CATARACT  EXTRACTION PHACO AND INTRAOCULAR LENS PLACEMENT ([place]) LEFT HYDRUS MICROSTENT DIABETIC;  Surgeon: Eulogio Bear, MD;  Location: [facility];  Service: Ophthalmology;  Laterality: Left;  Diabetic 7.49 00:42.1   CATARACT EXTRACTION W/PHACO Right 10/15/2021   Procedure: PHACO EMULSIFICATION  with HYDRUS MICROSTENT iMPLANTATION;  Surgeon: Eulogio Bear, MD;  Location: [facility];  Service: Ophthalmology;  Laterality: Right;  Diabetic 4.98 00:27.3   COLONOSCOPY WITH PROPOFOL N/A 11/25/2017   Procedure: COLONOSCOPY WITH PROPOFOL;  Surgeon: [name], MD;  Location[facility] ENDOSCOPY;  Service: Endoscopy;  Laterality: N/A;   EYE SURGERY     LIPOMA RESECTION     right posterior neck   Family History  Problem Relation Age of Onset   Cancer Mother        Breast   Breast cancer Mother    Heart disease Father    Heart attack Brother    Cancer Maternal Aunt        breast   Breast cancer Maternal Aunt    Breast cancer Cousin        mat cousin   Social History   Socioeconomic History   Marital status: Widowed    Spouse name: Not on file   Number of children: 1   Years of education: Not on file   Highest education level: 12th grade  Occupational History    Employer: [employer]  Tobacco Use   Smoking status: Former    Packs/day: 1.00    Years: 7.00    Total pack years: 7.00    Types: Cigarettes    Quit date: 1965    Years since quitting: 58.7   Smokeless tobacco: Never   Tobacco comments:    smoking cessation materials not required  Vaping Use   Vaping Use: Never used  Substance and Sexual Activity   Alcohol use: No   Drug use: No   Sexual activity: Never  Other Topics Concern   Not on file  Social History Narrative   Pt lives alone.   Social Determinants of Health   Financial Resource Strain: Low Risk  (07/17/2022)   Overall Financial Resource Strain (CARDIA)    Difficulty of Paying Living Expenses: Not hard at all  Food Insecurity: No Food  Insecurity (07/17/2022)   Hunger Vital Sign    Worried About Running Out of Food in the Last Year: Never true    Ran Out of Food in the Last Year: Never true  Transportation Needs: No Transportation Needs (07/17/2022)   PRAPARE - Hydrologist (Medical): No    Lack of Transportation (Non-Medical): No  Physical Activity: Inactive (07/17/2022)   Exercise Vital Sign    Days of Exercise per Week: 0 days  Minutes of Exercise per Session: 0 min  Stress: Stress Concern Present (07/16/2021)   [place]    Feeling of Stress : To some extent  Social Connections: Moderately Isolated (07/17/2022)   Social Connection and Isolation Panel [NHANES]    Frequency of Communication with Friends and Family: More than three times a week    Frequency of Social Gatherings with Friends and Family: Once a week    Attends Religious Services: 1 to 4 times per year    Active Member of [employer] or Organizations: No    Attends Archivist Meetings: Never    Marital Status: Widowed    Tobacco Counseling Counseling given: Not Answered Tobacco comments: smoking cessation materials not required   Clinical Intake:  Pre-visit preparation completed: Yes  Pain : No/denies pain Pain Score: 0-No pain     BMI - recorded: 26.88 Nutritional Status: BMI 25 -29 Overweight Nutritional Risks: None Diabetes: Yes CBG done?: No Did pt. bring in CBG monitor from home?: No     Diabetic? Yes.  Interpreter Needed?: No  Information entered by :: Wyatt Haste, [place] of Daily Living    07/17/2022   10:04 AM 01/18/2022    1:41 PM  In your present state of health, do you have any difficulty performing the following activities:  Hearing? 0 0  Vision? 0 1  Difficulty concentrating or making decisions? 0 1  Walking or climbing stairs? 0 1  Dressing or bathing? 0 0  Doing errands, shopping? 0 0  Preparing  Food and eating ? N   Using the Toilet? N   In the past six months, have you accidently leaked urine? N   Do you have problems with loss of bowel control? N   Managing your Medications? N   Managing your Finances? N   Housekeeping or managing your Housekeeping? N     Patient Care Team: Glean Hess, MD as PCP - General (Internal Medicine) [facility] (Ophthalmology) Corey Skains, MD as Consulting Physician (Cardiology) Doyle Askew, MD as Referring Physician (Physical Medicine and Rehabilitation) Reche Dixon, [place] (Orthopedic Surgery)  Indicate any recent Medical Services you may have received from other than [facility] providers in the past year (date may be approximate).     Assessment:   This is a routine wellness examination for [place].  Hearing/Vision screen Hearing Screening - Comments:: No concerns for hearing. Vision Screening - Comments:: Wears prescription glasses.  Dietary issues and exercise activities discussed: Current Exercise Habits: The patient does not participate in regular exercise at present   Goals Addressed   None   Depression Screen    07/17/2022    9:58 AM 07/11/2022   10:44 AM 03/07/2022   10:22 AM 01/18/2022    1:41 PM 12/06/2021   11:10 AM 10/12/2021   11:03 AM 08/29/2021   10:42 AM  PHQ 2/9 Scores  PHQ - 2 Score 1 0 '1 2 2 '$ 0 1  PHQ- 9 Score 4 0 '3 7 4 2 3    '$ Fall Risk    07/17/2022   10:04 AM 07/11/2022   10:45 AM 03/07/2022   10:22 AM 01/18/2022    1:41 PM 12/06/2021   11:10 AM  [place] in the past year? 0 0 0 0 1  Number falls in past yr: 0 0 0 0 0  Injury with Fall? 0 0 0 0 1  Risk for fall due  to : No Fall Risks No Fall Risks No Fall Risks No Fall Risks History of fall(s)  Follow up Falls evaluation completed Falls evaluation completed Falls evaluation completed Falls evaluation completed Falls evaluation completed    FALL RISK PREVENTION PERTAINING TO THE HOME:  Any stairs in or around the home? Yes  If so, are there  any without handrails? Yes  Home free of loose throw rugs in walkways, pet beds, electrical cords, etc? Yes  Adequate lighting in your home to reduce risk of falls? Yes   ASSISTIVE DEVICES UTILIZED TO PREVENT FALLS:  Life alert? Yes  Use of a cane, walker or w/c? Yes  - Cane Grab bars in the bathroom? No  Shower chair or bench in shower? No  Elevated toilet seat or a handicapped toilet? Yes  TIMED UP AND GO:  Was the test performed? Yes .   Gait slow and steady without use of assistive device  Cognitive Function:        07/17/2022   10:04 AM 12/11/2020    2:10 PM 07/12/2020   10:19 AM 07/07/2019   10:28 AM 04/15/2018   10:04 AM  6CIT Screen  What Year? 0 points 0 points 0 points 0 points 0 points  What month? 0 points 0 points 0 points 0 points 0 points  What time? 0 points 0 points 0 points 0 points 0 points  Count back from 20 0 points 4 points 0 points 0 points 2 points  Months in reverse 4 points 2 points 0 points 0 points 2 points  Repeat phrase 4 points 4 points 0 points 0 points 2 points  Total Score 8 points 10 points 0 points 0 points 6 points    Immunizations Immunization History  Administered Date(s) Administered   Influenza-Unspecified 03/06/2016   Moderna Sars-Covid-2 Vaccination 01/01/2020, 01/29/2020, 10/31/2020   Td 03/05/2011    TDAP status: Due, Education has been provided regarding the importance of this vaccine. Advised may receive this vaccine at local pharmacy or Health Dept. Aware to provide a copy of the vaccination record if obtained from local pharmacy or Health Dept. Verbalized acceptance and understanding.  Flu Vaccine status: Declined, Education has been provided regarding the importance of this vaccine but patient still declined. Advised may receive this vaccine at local pharmacy or Health Dept. Aware to provide a copy of the vaccination record if obtained from local pharmacy or Health Dept. Verbalized acceptance and understanding.  Pneumococcal  vaccine status: Declined,  Education has been provided regarding the importance of this vaccine but patient still declined. Advised may receive this vaccine at local pharmacy or Health Dept. Aware to provide a copy of the vaccination record if obtained from local pharmacy or Health Dept. Verbalized acceptance and understanding.   Covid-19 vaccine status: Completed vaccines  Qualifies for Shingles Vaccine? Yes   Zostavax completed No   Shingrix Completed?: No.    Education has been provided regarding the importance of this vaccine. Patient has been advised to call insurance company to determine out of pocket expense if they have not yet received this vaccine. Advised may also receive vaccine at local pharmacy or Health Dept. Verbalized acceptance and understanding.  Screening Tests Health Maintenance  Topic Date Due   Zoster Vaccines- Shingrix (1 of 2) Never done   TETANUS/TDAP  03/04/2021   COVID-19 Vaccine (4 - Moderna risk series) 07/27/2022 (Originally 12/26/2020)   Pneumonia Vaccine 37+ Years old (1 - PCV) 01/19/2023 (Originally 10/28/2008)   INFLUENZA VACCINE  02/02/2023 (  Originally 06/04/2022)   MAMMOGRAM  09/26/2022   HEMOGLOBIN A1C  12/26/2022   FOOT EXAM  03/08/2023   OPHTHALMOLOGY EXAM  05/02/2023   DEXA SCAN  Completed   Hepatitis C Screening  Completed   HPV VACCINES  Aged Out   COLONOSCOPY (Pts 45-69yr Insurance coverage will need to be confirmed)  Discontinued    Health Maintenance  Health Maintenance Due  Topic Date Due   Zoster Vaccines- Shingrix (1 of 2) Never done   TETANUS/TDAP  03/04/2021    Colorectal cancer screening: Type of screening: Colonoscopy. Completed 11/25/2017. Repeat every 0 years. Patient has aged out of this screening.  Mammogram status: Completed 09/26/2021. Repeat every year  Bone Density status: Completed 08/04/2019. Results reflect: Bone density results: OSTEOPENIA. Repeat every 2 years.  Lung Cancer Screening: (Low Dose CT Chest  recommended if Age 79-80years, 30 pack-year currently smoking OR have quit w/in 15years.) does not qualify.   Lung Cancer Screening Referral: N/A  Additional Screening:  Hepatitis C Screening: does qualify; Completed 05/16/2020.  Vision Screening: Recommended annual ophthalmology exams for early detection of glaucoma and other disorders of the eye. Is the patient up to date with their annual eye exam?  Yes  Who is the provider or what is the name of the office in which the patient attends annual eye exams? [email or web address] If pt is not established with a provider, would they like to be referred to a provider to establish care?  N/A .   Dental Screening: Recommended annual dental exams for proper oral hygiene  Community Resource Referral / Chronic Care Management: CRR required this visit?  No   CCM required this visit?  No      Plan:     I have personally reviewed and noted the following in the patient's chart:   Medical and social history Use of alcohol, tobacco or illicit drugs  Current medications and supplements including opioid prescriptions. Patient is not currently taking opioid prescriptions. Functional ability and status Nutritional status Physical activity Advanced directives List of other physicians Hospitalizations, surgeries, and ER visits in previous 12 months Vitals Screenings to include cognitive, depression, and falls Referrals and appointments  In addition, I have reviewed and discussed with patient certain preventive protocols, quality metrics, and best practice recommendations. A written personalized care plan for preventive services as well as general preventive health recommendations were provided to patient.     CClista Bernhardt [place]  07/17/2022   Nurse Notes: None.

## 2022-07-17 ENCOUNTER — Ambulatory Visit (INDEPENDENT_AMBULATORY_CARE_PROVIDER_SITE_OTHER): Payer: Medicare Other

## 2022-07-17 VITALS — BP 122/68 | HR 82 | Ht 69.0 in | Wt 182.0 lb

## 2022-07-17 DIAGNOSIS — Z0000 Encounter for general adult medical examination without abnormal findings: Secondary | ICD-10-CM | POA: Diagnosis not present

## 2022-08-08 DIAGNOSIS — H900 Conductive hearing loss, bilateral: Secondary | ICD-10-CM | POA: Diagnosis not present

## 2022-08-08 DIAGNOSIS — H6123 Impacted cerumen, bilateral: Secondary | ICD-10-CM | POA: Diagnosis not present

## 2022-08-09 ENCOUNTER — Other Ambulatory Visit: Payer: Self-pay | Admitting: Internal Medicine

## 2022-08-09 DIAGNOSIS — E119 Type 2 diabetes mellitus without complications: Secondary | ICD-10-CM

## 2022-08-09 NOTE — Telephone Encounter (Signed)
Requested Prescriptions  Pending Prescriptions Disp Refills  . pioglitazone (ACTOS) 15 MG tablet [Pharmacy Med Name: PIOGLITAZONE HCL 15 MG TABLET] 90 tablet 1    Sig: TAKE 1 TABLET BY MOUTH EVERY DAY     Endocrinology:  Diabetes - Glitazones - pioglitazone Passed - 08/09/2022  9:39 AM      Passed - HBA1C is between 0 and 7.9 and within 180 days    Hemoglobin A1C  Date Value Ref Range Status  06/25/2022 6.4  Final         Passed - Valid encounter within last 6 months    Recent Outpatient Visits          4 weeks ago Primary osteoarthritis of right knee   [place] [facility] at [facility], Jesse Sans, MD   5 months ago Essential (primary) hypertension   [place] [facility] at [facility], Jesse Sans, MD   6 months ago Type II diabetes mellitus with complication [facility])   [place] [facility] at [facility], Jesse Sans, MD   8 months ago Type II diabetes mellitus with complication [facility])    Primary Care and Sports Medicine at [facility], Jesse Sans, MD   10 months ago Syncope, unspecified syncope type   [facility] at [facility], Jesse Sans, MD      Future Appointments            In 3 months Army Melia, Jesse Sans, MD [place] and Sports Medicine at [facility], [facility]

## 2022-08-19 ENCOUNTER — Other Ambulatory Visit: Payer: Self-pay | Admitting: Internal Medicine

## 2022-08-19 DIAGNOSIS — H353211 Exudative age-related macular degeneration, right eye, with active choroidal neovascularization: Secondary | ICD-10-CM | POA: Diagnosis not present

## 2022-08-20 NOTE — Telephone Encounter (Signed)
Requested Prescriptions  Pending Prescriptions Disp Refills  . traZODone (DESYREL) 50 MG tablet [Pharmacy Med Name: TRAZODONE 50 MG TABLET] 90 tablet 0    Sig: TAKE 1 TABLET BY MOUTH EVERYDAY AT BEDTIME     Psychiatry: Antidepressants - Serotonin Modulator Passed - 08/19/2022  8:36 AM      Passed - Valid encounter within last 6 months    Recent Outpatient Visits          1 month ago Primary osteoarthritis of right knee   [place] [facility] Medicine at [facility], Jesse Sans, MD   5 months ago Essential (primary) hypertension   [place] [facility] and [facility] Medicine at [facility], Jesse Sans, MD   7 months ago Type II diabetes mellitus with complication [facility])   [place] [facility] and Sports Medicine at [facility], Jesse Sans, MD   8 months ago Type II diabetes mellitus with complication [facility])   [employer] Primary Care and Sports Medicine at [facility], Jesse Sans, MD   10 months ago Syncope, unspecified syncope type   [facility] Primary Care and Sports Medicine at [facility], Jesse Sans, MD      Future Appointments            In 2 months Army Melia, Jesse Sans, MD [place] and Sports Medicine at [facility], [facility]

## 2022-08-20 NOTE — Telephone Encounter (Signed)
Requested Prescriptions  Pending Prescriptions Disp Refills  . JANUVIA 100 MG tablet [Pharmacy Med Name: JANUVIA 100 MG TABLET] 90 tablet 0    Sig: TAKE 1 TABLET BY [place] DAY     Endocrinology:  Diabetes - DPP-4 Inhibitors Failed - 08/19/2022  1:41 AM      Failed - Cr in normal range and within 360 days    Creatinine  Date Value Ref Range Status  07/22/2012 0.96 0.60 - 1.30 mg/dL Final   Creatinine, Ser  Date Value Ref Range Status  03/07/2022 1.49 (H) 0.57 - 1.00 mg/dL Final         Passed - HBA1C is between 0 and 7.9 and within 180 days    Hemoglobin A1C  Date Value Ref Range Status  06/25/2022 6.4  Final         Passed - Valid encounter within last 6 months    Recent Outpatient Visits          1 month ago Primary osteoarthritis of right knee   [place] [facility] Medicine at [facility], Jesse Sans, MD   5 months ago Essential (primary) hypertension   [place] [facility] and Sports Medicine at [facility], Jesse Sans, MD   7 months ago Type II diabetes mellitus with complication [facility])   [place] [facility] and Sports Medicine at [facility], Jesse Sans, MD   8 months ago Type II diabetes mellitus with complication [facility])    Primary Care and Sports Medicine at [facility], Jesse Sans, MD   10 months ago Syncope, unspecified syncope type   [facility] and [facility] Medicine at [facility], Jesse Sans, MD      Future Appointments            In 2 months Army Melia, Jesse Sans, MD [place] and Sports Medicine at [facility], [facility]

## 2022-09-30 DIAGNOSIS — H209 Unspecified iridocyclitis: Secondary | ICD-10-CM | POA: Diagnosis not present

## 2022-10-09 ENCOUNTER — Other Ambulatory Visit: Payer: Self-pay | Admitting: Internal Medicine

## 2022-10-09 DIAGNOSIS — Z1231 Encounter for screening mammogram for malignant neoplasm of breast: Secondary | ICD-10-CM

## 2022-10-10 DIAGNOSIS — Z794 Long term (current) use of insulin: Secondary | ICD-10-CM | POA: Diagnosis not present

## 2022-10-10 DIAGNOSIS — E119 Type 2 diabetes mellitus without complications: Secondary | ICD-10-CM | POA: Diagnosis not present

## 2022-10-10 DIAGNOSIS — Z885 Allergy status to narcotic agent status: Secondary | ICD-10-CM | POA: Diagnosis not present

## 2022-10-10 DIAGNOSIS — Z888 Allergy status to other drugs, medicaments and biological substances status: Secondary | ICD-10-CM | POA: Diagnosis not present

## 2022-10-10 DIAGNOSIS — M1712 Unilateral primary osteoarthritis, left knee: Secondary | ICD-10-CM | POA: Diagnosis not present

## 2022-10-10 DIAGNOSIS — Z881 Allergy status to other antibiotic agents status: Secondary | ICD-10-CM | POA: Diagnosis not present

## 2022-10-10 DIAGNOSIS — Z87891 Personal history of nicotine dependence: Secondary | ICD-10-CM | POA: Diagnosis not present

## 2022-10-10 DIAGNOSIS — Z91048 Other nonmedicinal substance allergy status: Secondary | ICD-10-CM | POA: Diagnosis not present

## 2022-10-10 DIAGNOSIS — Z886 Allergy status to analgesic agent status: Secondary | ICD-10-CM | POA: Diagnosis not present

## 2022-10-10 DIAGNOSIS — I10 Essential (primary) hypertension: Secondary | ICD-10-CM | POA: Diagnosis not present

## 2022-10-10 DIAGNOSIS — Z7984 Long term (current) use of oral hypoglycemic drugs: Secondary | ICD-10-CM | POA: Diagnosis not present

## 2022-10-14 ENCOUNTER — Ambulatory Visit
Admission: RE | Admit: 2022-10-14 | Discharge: 2022-10-14 | Disposition: A | Discharge status: Home / Self Care | Payer: Medicare Other | Source: Ambulatory Visit | Attending: Internal Medicine | Admitting: Internal Medicine

## 2022-10-14 ENCOUNTER — Other Ambulatory Visit: Payer: Self-pay | Admitting: Internal Medicine

## 2022-10-14 DIAGNOSIS — Z1231 Encounter for screening mammogram for malignant neoplasm of breast: Secondary | ICD-10-CM | POA: Diagnosis not present

## 2022-10-14 DIAGNOSIS — E1169 Type 2 diabetes mellitus with other specified complication: Secondary | ICD-10-CM

## 2022-11-06 ENCOUNTER — Ambulatory Visit
Admission: EM | Admit: 2022-11-06 | Discharge: 2022-11-06 | Disposition: A | Discharge status: Home / Self Care | Payer: Medicare Other | Attending: Emergency Medicine | Admitting: Emergency Medicine

## 2022-11-06 ENCOUNTER — Other Ambulatory Visit: Payer: Self-pay

## 2022-11-06 DIAGNOSIS — J069 Acute upper respiratory infection, unspecified: Secondary | ICD-10-CM

## 2022-11-06 MED ORDER — BENZONATATE 100 MG PO CAPS: 200.0000 mg | ORAL_CAPSULE | Freq: Three times a day (TID) | ORAL | 0 refills | Status: DC

## 2022-11-06 MED ORDER — IPRATROPIUM BROMIDE 0.06 % NA SOLN: 2.0000 | Freq: Four times a day (QID) | NASAL | 12 refills | Status: DC

## 2022-11-06 MED ORDER — PROMETHAZINE-DM 6.25-15 MG/5ML PO SYRP: 5.0000 mL | ORAL_SOLUTION | Freq: Four times a day (QID) | ORAL | 0 refills | Status: DC | PRN

## 2022-11-06 MED ORDER — DOXYCYCLINE HYCLATE 100 MG PO CAPS: 100.0000 mg | ORAL_CAPSULE | Freq: Two times a day (BID) | ORAL | 0 refills | Status: DC

## 2022-11-06 NOTE — ED Provider Notes (Signed)
[facility]    CSN: [id number] Arrival date & time: 11/06/22  0907      History   Chief Complaint Chief Complaint  Patient presents with   Nasal Congestion   sneezing   Eye Drainage    HPI Cathy Murphy is a 82 y.o. female.   HPI  82 year old female here for evaluation of respiratory complaints.  The patient reports that her symptoms have been going on since before Christmas, at least 10 days, and consist of runny nose with yellow nasal discharge, sore throat, productive cough for yellow sputum, and she reports fever.  When asked how high her fever was she states that it was 98.1.  She has not had any shortness breath or wheezing.  Her past medical history significant for hypertension, hyperlipidemia, diabetes, anxiety, and glaucoma.  Past Medical History:  Diagnosis Date   Allergy    Anxiety    COVID-19 09/08/2021   Fever, chills, sneezing, runny nose.  Resolved.   Diabetes mellitus without complication ([place])    Glaucoma    Hyperlipidemia    Hypertension     Patient Active Problem List   Diagnosis Date Noted   Syncope 12/06/2021   Difficulty sleeping 12/29/2020   Mild cognitive impairment 12/29/2020   Headache disorder 12/29/2020   Stage 3a chronic kidney disease ([place]) 08/16/2020   Osteopenia determined by x-ray 08/05/2019   Atherosclerosis of abdominal aorta ([place]) 06/09/2019   Coronary artery disease involving native coronary artery of native heart 06/09/2019   BMI 24.0-24.9, adult 11/06/2018   Bilateral neck pain 10/08/2018   Aortic calcification ([place]) 03/05/2018   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   Ventral hernia without obstruction or gangrene 03/06/2016   Type II diabetes mellitus with complication ([place]) 02/54/2706   Plantar fasciitis, right 06/16/2015   Knee torn cartilage 04/26/2015   Allergic rhinitis, seasonal 04/26/2015   Essential (primary) hypertension 04/26/2015   Glaucoma 04/26/2015   Hyperlipidemia associated with type 2 diabetes  mellitus ([place]) 04/26/2015   Insomnia 04/26/2015   Osteoarthritis of right knee 05/03/2014    Past Surgical History:  Procedure Laterality Date   ABDOMINAL HYSTERECTOMY     CATARACT EXTRACTION W/PHACO Left 10/01/2021   Procedure: CATARACT EXTRACTION PHACO AND INTRAOCULAR LENS PLACEMENT ([place]) LEFT HYDRUS MICROSTENT DIABETIC;  Surgeon: Eulogio Bear, MD;  Location: [place];  Service: Ophthalmology;  Laterality: Left;  Diabetic 7.49 00:42.1   CATARACT EXTRACTION W/PHACO Right 10/15/2021   Procedure: PHACO EMULSIFICATION  with HYDRUS MICROSTENT iMPLANTATION;  Surgeon: Eulogio Bear, MD;  Location: [place];  Service: Ophthalmology;  Laterality: Right;  Diabetic 4.98 00:27.3   COLONOSCOPY WITH PROPOFOL N/A 11/25/2017   Procedure: COLONOSCOPY WITH PROPOFOL;  Surgeon: [name], MD;  Location: [facility] ENDOSCOPY;  Service: Endoscopy;  Laterality: N/A;   EYE SURGERY     LIPOMA RESECTION     right posterior neck    OB History   No obstetric history on file.      Home Medications    Prior to Admission medications   Medication Sig Start Date End Date Taking? Authorizing Provider  benzonatate (TESSALON) 100 MG capsule Take 2 capsules (200 mg total) by mouth every 8 (eight) hours. 11/06/22  Yes Margarette Canada, NP  doxycycline (VIBRAMYCIN) 100 MG capsule Take 1 capsule (100 mg total) by mouth 2 (two) times daily for 7 days. 11/06/22 11/13/22 Yes Margarette Canada, NP  ipratropium (ATROVENT) 0.06 % nasal spray Place 2 sprays into both nostrils 4 (four) times daily.  11/06/22  Yes Margarette Canada, NP  promethazine-dextromethorphan (PROMETHAZINE-DM) 6.25-15 MG/5ML syrup Take 5 mLs by mouth 4 (four) times daily as needed. 11/06/22  Yes Margarette Canada, NP  B-D UF III MINI PEN NEEDLES 31G X 5 MM MISC USE AS DIRECTED WITH LEVEMIR 11/16/21   Glean Hess, MD  cholecalciferol (VITAMIN D) 1000 units tablet Take 2,000 Units by mouth daily.    [provider]  Cholecalciferol  25 MCG (1000 UT) tablet Take by mouth.    [provider]  ezetimibe (ZETIA) 10 MG tablet Take 10 mg by mouth daily. 06/09/19   [provider]  glipiZIDE (GLUCOTROL) 5 MG tablet Take 1 tablet (5 mg total) by mouth 2 (two) times daily. 07/11/22   Glean Hess, MD  glucose blood test strip Use to test blood sugar up to twice daily. 12/06/21   Glean Hess, MD  hydrOXYzine (ATARAX) 25 MG tablet TAKE 0.5-1 TABLETS (12.5-25 MG TOTAL) BY MOUTH EVERY 8 (EIGHT) HOURS AS NEEDED FOR ANXIETY. 03/20/22   Glean Hess, MD  insulin glargine (LANTUS SOLOSTAR) 100 UNIT/ML Solostar Pen Inject 30 Units into the skin every morning AND 20 Units every evening. Patient taking differently: Inject 30 Units into the skin every morning 12/17/21   Glean Hess, MD  JANUVIA 100 MG tablet TAKE 1 TABLET BY MOUTH EVERY DAY 08/20/22   Glean Hess, MD  lisinopril (ZESTRIL) 40 MG tablet Take 1 tablet (40 mg total) by mouth daily. 07/11/22   Glean Hess, MD  pioglitazone (ACTOS) 15 MG tablet TAKE 1 TABLET BY MOUTH EVERY DAY 08/09/22   Glean Hess, MD  rosuvastatin (CRESTOR) 40 MG tablet TAKE 1 TABLET BY MOUTH EVERY DAY 10/14/22   Glean Hess, MD  spironolactone (ALDACTONE) 25 MG tablet Take 1 tablet (25 mg total) by mouth daily. 04/17/21   Glean Hess, MD  traZODone (DESYREL) 50 MG tablet TAKE 1 TABLET BY MOUTH EVERYDAY AT BEDTIME 08/20/22   Glean Hess, MD  albuterol [facility] HFA) [phone] Base) MCG/ACT inhaler Inhale 2 puffs into the lungs every 6 (six) hours as needed for wheezing or shortness of breath. 02/18/19 12/30/19  Glean Hess, MD    Family History Family History  Problem Relation Age of Onset   Cancer Mother        Breast   Breast cancer Mother    Heart disease Father    Heart attack Brother    Cancer Maternal Aunt        breast   Breast cancer Maternal Aunt    Breast cancer Cousin        mat cousin    Social History Social History   Tobacco  Use   Smoking status: Former    Packs/day: 1.00    Years: 7.00    Total pack years: 7.00    Types: Cigarettes    Quit date: 1965    Years since quitting: 59.0   Smokeless tobacco: Never   Tobacco comments:    smoking cessation materials not required  Vaping Use   Vaping Use: Never used  Substance Use Topics   Alcohol use: No   Drug use: No     Allergies   Farxiga [dapagliflozin], Alprazolam, Amoxicillin, Aricept [donepezil], Aspirin, Metformin and related, Oxycodone, Riomet [metformin hcl], Sulfa antibiotics, Hydrocodone, Nabumetone, and Skelaxin [metaxalone]   Review of Systems Review of Systems  Constitutional:  Positive for fever.  HENT:  Positive for congestion, rhinorrhea and sore  throat. Negative for ear pain.   Respiratory:  Positive for cough. Negative for shortness of breath and wheezing.      Physical Exam Triage Vital Signs ED Triage Vitals  Enc Vitals Group     BP 11/06/22 1058 (!) 148/70     Pulse Rate 11/06/22 1058 80     Resp 11/06/22 1058 18     Temp 11/06/22 1058 98.5 F (36.9 C)     Temp Source 11/06/22 1058 Oral     SpO2 11/06/22 1058 99 %     Weight 11/06/22 1057 182 lb (82.6 kg)     Height 11/06/22 1057 _0  (1.753 m)     Head Circumference --      Peak Flow --      Pain Score 11/06/22 1057 0     Pain Loc --      Pain Edu? --      Excl. in [place]? --    No data found.  Updated Vital Signs BP (!) 148/70 (BP Location: Left Arm)   Pulse 80   Temp 98.5 F (36.9 C) (Oral)   Resp 18   Ht _1  (1.753 m)   Wt 182 lb (82.6 kg)   SpO2 99%   BMI 26.88 kg/m   Visual Acuity Right Eye Distance:   Left Eye Distance:   Bilateral Distance:    Right Eye Near:   Left Eye Near:    Bilateral Near:     Physical Exam Vitals and nursing note reviewed.  Constitutional:      Appearance: Normal appearance. She is ill-appearing.  HENT:     Head: Normocephalic and atraumatic.     Right Ear: Tympanic membrane, ear canal and external ear normal.  There is no impacted cerumen.     Left Ear: Tympanic membrane, ear canal and external ear normal. There is no impacted cerumen.     Nose: Congestion and rhinorrhea present.     Comments: Nasal mucosa is erythematous and edematous and there is dried yellow drainage in both nares.    Mouth/Throat:     Mouth: Mucous membranes are moist.     Pharynx: Oropharynx is clear. Posterior oropharyngeal erythema present. No oropharyngeal exudate.     Comments: Oropharynx has mild erythema and injection with yellow postnasal drip. Cardiovascular:     Rate and Rhythm: Normal rate and regular rhythm.     Pulses: Normal pulses.     Heart sounds: Normal heart sounds. No murmur heard.    No friction rub. No gallop.  Pulmonary:     Effort: Pulmonary effort is normal.     Breath sounds: Normal breath sounds. No wheezing, rhonchi or rales.  Musculoskeletal:     Cervical back: Normal range of motion and neck supple.  Lymphadenopathy:     Cervical: No cervical adenopathy.  Skin:    General: Skin is warm and dry.     Capillary Refill: Capillary refill takes less than 2 seconds.     Findings: No erythema or rash.  Neurological:     General: No focal deficit present.     Mental Status: She is alert and oriented to person, place, and time.  Psychiatric:        Mood and Affect: Mood normal.        Behavior: Behavior normal.        Thought Content: Thought content normal.        Judgment: Judgment normal.      UC Treatments / Results  Labs (all labs ordered are listed, but only abnormal results are displayed) Labs Reviewed - No data to display  EKG   Radiology No results found.  Procedures Procedures (including critical care time)  Medications Ordered in UC Medications - No data to display  Initial Impression / Assessment and Plan / UC Course  I have reviewed the triage vital signs and the nursing notes.  Pertinent labs & imaging results that were available during my care of the patient  were reviewed by me and considered in my medical decision making (see chart for details).   Is a nontoxic-appearing 82 year old female here for evaluation of 10 days worth of respiratory complaints as outlined in HPI above.  She is not in any acute distress and she can speak in full sentence without dyspnea or tachypnea.  She is afebrile.  Patient's physical exam does reveal inflammation of her upper respiratory tract with thick, dried yellow drainage in both nares.  She also has yellow postnasal drip.  Her lungs are clear to auscultation.  I suspect that she has an upper respiratory infection and given the fact that her symptoms are present for over 10 days I will do a trial of antibiotics.  She states that amoxicillin gives her diarrhea and she does not have it with sulfa.  I did advise her that antibiotics can cause diarrhea and this is not an allergy but a normal side effect.  I will treat her with a 7-day course of doxycycline 100 mg twice daily.  I have encouraged her to take this medication with food and to follow each dosing with an over-the-counter probiotic such as Culturelle or align.  We also discussed sinus irrigation.  I will prescribe Atrovent nasal spray to help her with her congestion that she can use 4 times a day.  For her cough and congestion I prescribed Atrovent nasal spray and Promethazine DM.  I did advise her that the promethazine may make her sleepy and that if she has to get up in the night to use the bathroom that she should make sure she is steady and has her faculties about her before she tries to walk to prevent a fall.  She verbalizes understanding of same.   Final Clinical Impressions(s) / UC Diagnoses   Final diagnoses:  Upper respiratory tract infection, unspecified type     Discharge Instructions      The Doxycycline twice daily with food for 7 days for treatment of your URI.  Perform sinus irrigation 2-3 times a day with a [employer] sinus rinse kit and distilled  water.  Do not use tap water.  You can use plain over-the-counter Mucinex every 6 hours to break up the stickiness of the mucus so your body can clear it.  Increase your oral fluid intake to thin out your mucus so that is also able for your body to clear more easily.  Take an over-the-counter probiotic, such as Culturelle-align-activia, 1 hour after each dose of antibiotic to prevent diarrhea.  Use the Atrovent nasal spray, 2 squirts in each nostril every 6 hours, as needed for runny nose and postnasal drip.  Use the Tessalon Perles every 8 hours during the day.  Take them with a small sip of water.  They may give you some numbness to the base of your tongue or a metallic taste in your mouth, this is normal.  Use the Promethazine DM cough syrup at bedtime for cough and congestion.  It will make you drowsy so do  not take it during the day.  If you develop any new or worsening symptoms return for reevaluation or see your primary care provider.      ED Prescriptions     Medication Sig Dispense Auth. Provider   doxycycline (VIBRAMYCIN) 100 MG capsule Take 1 capsule (100 mg total) by mouth 2 (two) times daily for 7 days. 14 capsule Margarette Canada, NP   ipratropium (ATROVENT) 0.06 % nasal spray Place 2 sprays into both nostrils 4 (four) times daily. 15 mL Margarette Canada, NP   benzonatate (TESSALON) 100 MG capsule Take 2 capsules (200 mg total) by mouth every 8 (eight) hours. 21 capsule Margarette Canada, NP   promethazine-dextromethorphan (PROMETHAZINE-DM) 6.25-15 MG/5ML syrup Take 5 mLs by mouth 4 (four) times daily as needed. 118 mL Margarette Canada, NP      PDMP not reviewed this encounter.   Margarette Canada, NP 11/06/22 1150

## 2022-11-06 NOTE — Discharge Instructions (Signed)
The Doxycycline twice daily with food for 7 days for treatment of your URI.  Perform sinus irrigation 2-3 times a day with a [employer] sinus rinse kit and distilled water.  Do not use tap water.  You can use plain over-the-counter Mucinex every 6 hours to break up the stickiness of the mucus so your body can clear it.  Increase your oral fluid intake to thin out your mucus so that is also able for your body to clear more easily.  Take an over-the-counter probiotic, such as Culturelle-align-activia, 1 hour after each dose of antibiotic to prevent diarrhea.  Use the Atrovent nasal spray, 2 squirts in each nostril every 6 hours, as needed for runny nose and postnasal drip.  Use the Tessalon Perles every 8 hours during the day.  Take them with a small sip of water.  They may give you some numbness to the base of your tongue or a metallic taste in your mouth, this is normal.  Use the Promethazine DM cough syrup at bedtime for cough and congestion.  It will make you drowsy so do not take it during the day.  If you develop any new or worsening symptoms return for reevaluation or see your primary care provider.

## 2022-11-06 NOTE — ED Triage Notes (Addendum)
Pt states since before Christmas she has had nasal congestion, runny nose, sneezing, eyes watering and scratchy throat. Right nasal passage "burning"

## 2022-11-12 DIAGNOSIS — H209 Unspecified iridocyclitis: Secondary | ICD-10-CM | POA: Diagnosis not present

## 2022-11-13 ENCOUNTER — Ambulatory Visit (INDEPENDENT_AMBULATORY_CARE_PROVIDER_SITE_OTHER): Payer: Medicare Other | Admitting: Internal Medicine

## 2022-11-13 ENCOUNTER — Encounter: Payer: Self-pay | Admitting: Internal Medicine

## 2022-11-13 VITALS — BP 118/66 | HR 75 | Ht 69.0 in | Wt 183.0 lb

## 2022-11-13 DIAGNOSIS — M1712 Unilateral primary osteoarthritis, left knee: Secondary | ICD-10-CM | POA: Diagnosis not present

## 2022-11-13 DIAGNOSIS — E785 Hyperlipidemia, unspecified: Secondary | ICD-10-CM

## 2022-11-13 DIAGNOSIS — E118 Type 2 diabetes mellitus with unspecified complications: Secondary | ICD-10-CM | POA: Diagnosis not present

## 2022-11-13 DIAGNOSIS — I700 Atherosclerosis of aorta: Secondary | ICD-10-CM

## 2022-11-13 DIAGNOSIS — E1169 Type 2 diabetes mellitus with other specified complication: Secondary | ICD-10-CM | POA: Diagnosis not present

## 2022-11-13 DIAGNOSIS — I10 Essential (primary) hypertension: Secondary | ICD-10-CM | POA: Diagnosis not present

## 2022-11-13 NOTE — Assessment & Plan Note (Signed)
Has bone on bone OA Anticipating TKA in March or April this year

## 2022-11-13 NOTE — Assessment & Plan Note (Signed)
Clinically stable exam with well controlled BP on lisinopril. Tolerating medications without side effects at this time. Pt to continue current regimen and low sodium diet; benefits of regular exercise as able discussed.

## 2022-11-13 NOTE — Progress Notes (Signed)
Date:  11/13/2022   Name:  Cathy Murphy   DOB:  28-Oct-1941   MRN:  [id number]   Chief Complaint: Diabetes and Hypertension  Hypertension This is a chronic problem. The problem is controlled. Pertinent negatives include no chest pain, headaches, palpitations or shortness of breath. Past treatments include ACE inhibitors and diuretics. The current treatment provides significant improvement. Hypertensive end-organ damage includes kidney disease. There is no history of CAD/MI or CVA.  Diabetes She presents for her follow-up diabetic visit. She has type 2 diabetes mellitus. Pertinent negatives for hypoglycemia include no headaches or tremors. Pertinent negatives for diabetes include no chest pain, no fatigue, no polydipsia and no polyuria. Pertinent negatives for diabetic complications include no CVA. Current diabetic treatments: actos, januvia, lantus, glipizide. She is compliant with treatment all of the time. There is no compliance with monitoring of blood glucose.  Hyperlipidemia This is a chronic problem. Recent lipid tests were reviewed and are high. Pertinent negatives include no chest pain or shortness of breath. Current antihyperlipidemic treatment includes statins and ezetimibe.    Lab Results  Component Value Date   NA 140 03/07/2022   K 4.1 03/07/2022   CO2 23 03/07/2022   GLUCOSE 116 (H) 03/07/2022   BUN 23 03/07/2022   CREATININE 1.49 (H) 03/07/2022   CALCIUM 9.1 03/07/2022   EGFR 36 (L) 03/07/2022   GFRNONAA 38 (L) 08/16/2020   Lab Results  Component Value Date   CHOL 159 08/29/2021   HDL 58 08/29/2021   LDLCALC 81 08/29/2021   TRIG 110 08/29/2021   CHOLHDL 2.7 08/29/2021   Lab Results  Component Value Date   TSH 2.320 08/29/2021   Lab Results  Component Value Date   HGBA1C 6.4 06/25/2022   Lab Results  Component Value Date   WBC 6.5 08/29/2021   HGB 14.2 08/29/2021   HCT 44.6 08/29/2021   MCV 88 08/29/2021   PLT 182 08/29/2021   Lab Results   Component Value Date   ALT 13 08/29/2021   AST 17 08/29/2021   ALKPHOS 67 08/29/2021   BILITOT 1.5 (H) 08/29/2021   Lab Results  Component Value Date   VD25OH 12.8 (L) 06/15/2015     Review of Systems  Constitutional:  Negative for appetite change, fatigue, fever and unexpected weight change.  HENT:  Negative for tinnitus and trouble swallowing.   Eyes:  Positive for visual disturbance (right eye persistent discomfort after cataract surgery).  Respiratory:  Negative for cough, chest tightness and shortness of breath.   Cardiovascular:  Negative for chest pain, palpitations and leg swelling.  Gastrointestinal:  Negative for abdominal pain.  Endocrine: Negative for polydipsia and polyuria.  Genitourinary:  Negative for dysuria and hematuria.  Musculoskeletal:  Positive for arthralgias and gait problem.  Neurological:  Negative for tremors, numbness and headaches.  Psychiatric/Behavioral:  Negative for dysphoric mood.     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   Ventral hernia without obstruction or gangrene 03/06/2016   Type II diabetes  mellitus with complication ([place]) 33/82/5053   Plantar fasciitis, right 06/16/2015   Knee torn cartilage 04/26/2015   Allergic rhinitis, seasonal 04/26/2015   Essential (primary) hypertension 04/26/2015   Glaucoma 04/26/2015   Hyperlipidemia associated with type 2 diabetes mellitus ([place]) 04/26/2015   Insomnia 04/26/2015    Osteoarthritis of left knee 05/03/2014    Allergies  Allergen Reactions   Farxiga [Dapagliflozin] Itching    Genital yeast infection   Alprazolam Nausea Only   Amoxicillin Diarrhea   Aricept [Donepezil] Other (See Comments)    Leg cramps   Aspirin Nausea And Vomiting   Metformin And Related Diarrhea   Oxycodone Other (See Comments)    CNS side effects/agitation   Riomet [Metformin Hcl] Nausea And Vomiting   Sulfa Antibiotics    Hydrocodone Rash   Nabumetone Rash   Skelaxin [Metaxalone] Rash    Past Surgical History:  Procedure Laterality Date   ABDOMINAL HYSTERECTOMY     CATARACT EXTRACTION W/PHACO Left 10/01/2021   Procedure: CATARACT EXTRACTION PHACO AND INTRAOCULAR LENS PLACEMENT ([place]) LEFT HYDRUS MICROSTENT DIABETIC;  Surgeon: Eulogio Bear, MD;  Location: [facility];  Service: Ophthalmology;  Laterality: Left;  Diabetic 7.49 00:42.1   CATARACT EXTRACTION W/PHACO Right 10/15/2021   Procedure: PHACO EMULSIFICATION  with HYDRUS MICROSTENT iMPLANTATION;  Surgeon: Eulogio Bear, MD;  Location: [facility];  Service: Ophthalmology;  Laterality: Right;  Diabetic 4.98 00:27.3   COLONOSCOPY WITH PROPOFOL N/A 11/25/2017   Procedure: COLONOSCOPY WITH PROPOFOL;  Surgeon: [name], MD;  [facility] ENDOSCOPY;  Service: Endoscopy;  Laterality: N/A;   EYE SURGERY     LIPOMA RESECTION     right posterior neck    Social History   Tobacco Use   Smoking status: Former    Packs/day: 1.00    Years: 7.00    Total pack years: 7.00    Types: Cigarettes    Quit date: 1965    Years since quitting: 59.0   Smokeless tobacco: Never   Tobacco comments:    smoking cessation materials not required  Vaping Use   Vaping Use: Never used  Substance Use Topics   Alcohol use: No   Drug use: No     Medication list has been reviewed and updated.  Current Meds  Medication Sig   B-D UF III MINI PEN NEEDLES 31G X 5 MM MISC USE AS DIRECTED WITH  LEVEMIR   bimatoprost (LUMIGAN) 0.01 % SOLN Administer 1 drop into the left eye two (2) times a day.   cholecalciferol (VITAMIN D) 1000 units tablet Take 2,000 Units by mouth daily.   Cholecalciferol 25 MCG (1000 UT) tablet Take by mouth.   ezetimibe (ZETIA) 10 MG tablet Take 10 mg by mouth daily.   glipiZIDE (GLUCOTROL) 5 MG tablet Take 1 tablet (5 mg total) by mouth 2 (two) times daily.   glucose blood test strip Use to test blood sugar up to twice daily.   hydrOXYzine (ATARAX) 25 MG tablet TAKE 0.5-1 TABLETS (12.5-25 MG TOTAL) BY MOUTH EVERY 8 (EIGHT) HOURS AS NEEDED FOR ANXIETY.   insulin glargine (LANTUS SOLOSTAR) 100 UNIT/ML Solostar Pen Inject 30 Units into the skin every morning AND 20 Units every evening. (Patient taking differently: Inject 30 Units into the skin every morning only - no evening dose)   ipratropium (ATROVENT) 0.06 % nasal spray Place 2 sprays into both nostrils 4 (four) times daily.   JANUVIA 100 MG tablet TAKE 1 TABLET BY MOUTH EVERY DAY   lisinopril (  ZESTRIL) 40 MG tablet Take 1 tablet (40 mg total) by mouth daily.   pioglitazone (ACTOS) 15 MG tablet TAKE 1 TABLET BY MOUTH EVERY DAY   promethazine-dextromethorphan (PROMETHAZINE-DM) 6.25-15 MG/5ML syrup Take 5 mLs by mouth 4 (four) times daily as needed.   rosuvastatin (CRESTOR) 40 MG tablet TAKE 1 TABLET BY MOUTH EVERY DAY   spironolactone (ALDACTONE) 25 MG tablet Take 1 tablet (25 mg total) by mouth daily.   traZODone (DESYREL) 50 MG tablet TAKE 1 TABLET BY MOUTH EVERYDAY AT BEDTIME   [DISCONTINUED] benzonatate (TESSALON) 100 MG capsule Take 2 capsules (200 mg total) by mouth every 8 (eight) hours.   [DISCONTINUED] doxycycline (VIBRAMYCIN) 100 MG capsule Take 1 capsule (100 mg total) by mouth 2 (two) times daily for 7 days.       11/13/2022    9:55 AM 07/11/2022   10:45 AM 03/07/2022   10:22 AM 01/18/2022    1:41 PM  GAD 7 : Generalized Anxiety Score  Nervous, Anxious, on Edge 0 0 1 1  Control/stop worrying 0 0 1 1   Worry too much - different things 0 0 1 1  Trouble relaxing 0 0 1 2  Restless 0 0 1 1  Easily annoyed or irritable 0 0 0 1  Afraid - awful might happen 0 0 0 1  Total GAD 7 Score 0 0 5 8  Anxiety Difficulty Not difficult at all Not difficult at all Not difficult at all Not difficult at all       11/13/2022    9:55 AM 07/17/2022    9:58 AM 07/11/2022   10:44 AM  Depression screen PHQ 2/9  Decreased Interest 1 1 0  Down, Depressed, Hopeless 0 0 0  PHQ - 2 Score 1 1 0  Altered sleeping 0 1 0  Tired, decreased energy 1 1 0  Change in appetite 0 0 0  Feeling bad or failure about yourself  0 0 0  Trouble concentrating 1 1 0  Moving slowly or fidgety/restless 0 0 0  Suicidal thoughts 0 0 0  PHQ-9 Score 3 4 0  Difficult doing work/chores Not difficult at all Not difficult at all Not difficult at all    BP Readings from Last 3 Encounters:  11/13/22 118/66  11/06/22 (!) 148/70  07/17/22 122/68    Physical Exam Vitals and nursing note reviewed.  Constitutional:      General: She is not in acute distress.    Appearance: Normal appearance. She is well-developed.  HENT:     Head: Normocephalic and atraumatic.  Cardiovascular:     Rate and Rhythm: Normal rate and regular rhythm.     Pulses: Normal pulses.     Heart sounds: No murmur heard. Pulmonary:     Effort: Pulmonary effort is normal. No respiratory distress.     Breath sounds: No wheezing or rhonchi.  Musculoskeletal:        General: Tenderness (left knee) present.     Cervical back: Normal range of motion.     Right lower leg: No edema.     Left lower leg: No edema.  Lymphadenopathy:     Cervical: No cervical adenopathy.  Skin:    General: Skin is warm and dry.     Capillary Refill: Capillary refill takes less than 2 seconds.     Findings: No rash.  Neurological:     General: No focal deficit present.     Mental Status: She is alert and oriented to person,  place, and time.  Psychiatric:        Mood and Affect:  Mood normal.        Behavior: Behavior normal.     Wt Readings from Last 3 Encounters:  11/13/22 183 lb (83 kg)  11/06/22 182 lb (82.6 kg)  07/17/22 182 lb (82.6 kg)    BP 118/66   Pulse 75   Ht '5\' 9"'$  (1.753 m)   Wt 183 lb (83 kg)   SpO2 95%   BMI 27.02 kg/m   Assessment and Plan: Problem List Items Addressed This Visit       Cardiovascular and Mediastinum   Aortic calcification (HCC) (Chronic)    On Statin therapy      Essential (primary) hypertension (Chronic)    Clinically stable exam with well controlled BP on lisinopril. Tolerating medications without side effects at this time. Pt to continue current regimen and low sodium diet; benefits of regular exercise as able discussed.         Endocrine   Hyperlipidemia associated with type 2 diabetes mellitus (HCC) (Chronic)    Cholesterol remains borderline LDL 81 On Crestor 40 mg and Zetia 10 mg      Relevant Orders   Lipid panel   Type II diabetes mellitus with complication (HCC) - Primary (Chronic)    Clinically stable by exam and report without s/s of hypoglycemia. DM complicated by hypertension and dyslipidemia. Tolerating medications.  On Lantus, Actos, Glipizide and Januvia Last A1C 6.4       Relevant Orders   Basic metabolic panel   Hemoglobin A1c     Musculoskeletal and Integument   Osteoarthritis of left knee (Chronic)    Has bone on bone OA Anticipating TKA in March or April this year        Partially dictated using Editor, commissioning. Any errors are unintentional.  Halina Maidens, MD [facility]  11/13/2022

## 2022-11-13 NOTE — Assessment & Plan Note (Signed)
On Statin therapy

## 2022-11-13 NOTE — Assessment & Plan Note (Addendum)
Clinically stable by exam and report without s/s of hypoglycemia. DM complicated by hypertension and dyslipidemia. Tolerating medications.  On Lantus, Actos, Glipizide and Januvia Last A1C 6.4

## 2022-11-13 NOTE — Assessment & Plan Note (Signed)
Cholesterol remains borderline LDL 81 On Crestor 40 mg and Zetia 10 mg

## 2022-11-14 LAB — BASIC METABOLIC PANEL
BUN/Creatinine Ratio: 12 (ref 12–28)
BUN: 17 mg/dL (ref 8–27)
CO2: 25 mmol/L (ref 20–29)
Calcium: 9.8 mg/dL (ref 8.7–10.3)
Chloride: 100 mmol/L (ref 96–106)
Creatinine, Ser: 1.42 mg/dL — ABNORMAL HIGH (ref 0.57–1.00)
Glucose: 114 mg/dL — ABNORMAL HIGH (ref 70–99)
Potassium: 4.4 mmol/L (ref 3.5–5.2)
Sodium: 142 mmol/L (ref 134–144)
eGFR: 38 mL/min/{1.73_m2} — ABNORMAL LOW (ref >59)

## 2022-11-14 LAB — LIPID PANEL
Chol/HDL Ratio: 2.8 ratio (ref 0.0–4.4)
Cholesterol, Total: 168 mg/dL (ref 100–199)
HDL: 60 mg/dL (ref >39)
LDL Chol Calc (NIH): 86 mg/dL (ref 0–99)
Triglycerides: 122 mg/dL (ref 0–149)
VLDL Cholesterol Cal: 22 mg/dL (ref 5–40)

## 2022-11-14 LAB — HEMOGLOBIN A1C
Est. average glucose Bld gHb Est-mCnc: 166 mg/dL
Hgb A1c MFr Bld: 7.4 % — ABNORMAL HIGH (ref 4.8–5.6)

## 2022-11-15 ENCOUNTER — Other Ambulatory Visit: Payer: Self-pay | Admitting: Internal Medicine

## 2022-11-15 NOTE — Telephone Encounter (Signed)
Requested Prescriptions  Pending Prescriptions Disp Refills   JANUVIA 100 MG tablet [Pharmacy Med Name: JANUVIA 100 MG TABLET] 90 tablet 0    Sig: TAKE 1 TABLET BY [place] DAY     Endocrinology:  Diabetes - DPP-4 Inhibitors Failed - 11/15/2022  1:57 AM      Failed - Cr in normal range and within 360 days    Creatinine  Date Value Ref Range Status  07/22/2012 0.96 0.60 - 1.30 mg/dL Final   Creatinine, Ser  Date Value Ref Range Status  11/13/2022 1.42 (H) 0.57 - 1.00 mg/dL Final         Passed - HBA1C is between 0 and 7.9 and within 180 days    Hemoglobin A1C  Date Value Ref Range Status  06/25/2022 6.4  Final   Hgb A1c MFr Bld  Date Value Ref Range Status  11/13/2022 7.4 (H) 4.8 - 5.6 % Final    Comment:             Prediabetes: 5.7 - 6.4          Diabetes: >6.4          Glycemic control for adults with diabetes: <7.0          Passed - Valid encounter within last 6 months    Recent Outpatient Visits           2 days ago Type II diabetes mellitus with complication [facility])   [place] [facility] at [facility], Jesse Sans, MD   4 months ago Primary osteoarthritis of right knee   [place] [facility] and [facility] Medicine at [facility], Jesse Sans, MD   8 months ago Essential (primary) hypertension   [place] [facility] and [facility] Medicine at [facility], Jesse Sans, MD   10 months ago Type II diabetes mellitus with complication [facility])   [place] [facility] and Sports Medicine at [facility], Jesse Sans, MD   11 months ago Type II diabetes mellitus with complication [facility])   [place] [facility] at [facility], Jesse Sans, MD       Future Appointments             In 3 months Army Melia, Jesse Sans, MD [facility] Primary Care and Sports Medicine at [facility], [facility]

## 2022-11-16 ENCOUNTER — Other Ambulatory Visit: Payer: Self-pay | Admitting: Internal Medicine

## 2022-11-18 NOTE — Telephone Encounter (Signed)
Requested Prescriptions  Pending Prescriptions Disp Refills   traZODone (DESYREL) 50 MG tablet [Pharmacy Med Name: TRAZODONE 50 MG TABLET] 90 tablet 0    Sig: TAKE 1 TABLET BY MOUTH EVERYDAY AT BEDTIME     Psychiatry: Antidepressants - Serotonin Modulator Passed - 11/16/2022 12:59 AM      Passed - Valid encounter within last 6 months    Recent Outpatient Visits           5 days ago Type II diabetes mellitus with complication [facility])   [place] [facility] at [facility], Jesse Sans, MD   4 months ago Primary osteoarthritis of right knee   [place] [facility] and Sports Medicine at [facility], Jesse Sans, MD   8 months ago Essential (primary) hypertension   [place] [facility] and [facility] Medicine at [facility], Jesse Sans, MD   10 months ago Type II diabetes mellitus with complication [facility])   [place] [facility] Care and Sports Medicine at [facility], Jesse Sans, MD   11 months ago Type II diabetes mellitus with complication [facility])    Primary Care and Sports Medicine at [facility], Jesse Sans, MD       Future Appointments             In 3 months Army Melia, Jesse Sans, MD [place] and Sports Medicine at [facility], [facility]

## 2022-12-12 DIAGNOSIS — H401132 Primary open-angle glaucoma, bilateral, moderate stage: Secondary | ICD-10-CM | POA: Diagnosis not present

## 2022-12-12 DIAGNOSIS — H209 Unspecified iridocyclitis: Secondary | ICD-10-CM | POA: Diagnosis not present

## 2022-12-17 DIAGNOSIS — H209 Unspecified iridocyclitis: Secondary | ICD-10-CM | POA: Diagnosis not present

## 2022-12-19 DIAGNOSIS — H5789 Other specified disorders of eye and adnexa: Secondary | ICD-10-CM | POA: Diagnosis not present

## 2022-12-19 DIAGNOSIS — T8522XD Displacement of intraocular lens, subsequent encounter: Secondary | ICD-10-CM | POA: Diagnosis not present

## 2022-12-20 ENCOUNTER — Other Ambulatory Visit: Payer: Self-pay | Admitting: Internal Medicine

## 2022-12-20 NOTE — Telephone Encounter (Signed)
Requested Prescriptions  Pending Prescriptions Disp Refills   B-D UF III MINI PEN NEEDLES 31G X 5 MM MISC [Pharmacy Med Name: BD UF MINI PEN NEEDLE 5MMX31G] 100 each 0    Sig: USE AS DIRECTED WITH LEVEMIR     Endocrinology: Diabetes - Testing Supplies Passed - 12/20/2022 10:16 AM      Passed - Valid encounter within last 12 months    Recent Outpatient Visits           1 month ago Type II diabetes mellitus with complication [facility])   [place] [facility] at [name], Jesse Sans, MD   5 months ago Primary osteoarthritis of right knee   [facility] at [facility], Jesse Sans, MD   9 months ago Essential (primary) hypertension   [place] [facility] Medicine at [facility], Jesse Sans, MD   11 months ago Type II diabetes mellitus with complication [facility])   [place] Primary Care & Sports Medicine at [facility], Jesse Sans, MD   1 year ago Type II diabetes mellitus with complication [facility])   [place] at [facility], Jesse Sans, MD       Future Appointments             In 2 months Army Melia, Jesse Sans, MD [place] at [facility], [facility]

## 2023-01-03 DIAGNOSIS — H401111 Primary open-angle glaucoma, right eye, mild stage: Secondary | ICD-10-CM | POA: Diagnosis not present

## 2023-01-03 DIAGNOSIS — H401122 Primary open-angle glaucoma, left eye, moderate stage: Secondary | ICD-10-CM | POA: Diagnosis not present

## 2023-01-15 DIAGNOSIS — J45909 Unspecified asthma, uncomplicated: Secondary | ICD-10-CM | POA: Diagnosis not present

## 2023-01-15 DIAGNOSIS — Z794 Long term (current) use of insulin: Secondary | ICD-10-CM | POA: Diagnosis not present

## 2023-01-15 DIAGNOSIS — Z79899 Other long term (current) drug therapy: Secondary | ICD-10-CM | POA: Diagnosis not present

## 2023-01-15 DIAGNOSIS — I10 Essential (primary) hypertension: Secondary | ICD-10-CM | POA: Diagnosis not present

## 2023-01-15 DIAGNOSIS — E119 Type 2 diabetes mellitus without complications: Secondary | ICD-10-CM | POA: Diagnosis not present

## 2023-01-15 DIAGNOSIS — T8522XD Displacement of intraocular lens, subsequent encounter: Secondary | ICD-10-CM | POA: Diagnosis not present

## 2023-01-15 DIAGNOSIS — H2701 Aphakia, right eye: Secondary | ICD-10-CM | POA: Diagnosis not present

## 2023-01-22 ENCOUNTER — Other Ambulatory Visit: Payer: Self-pay | Admitting: Internal Medicine

## 2023-01-22 DIAGNOSIS — F419 Anxiety disorder, unspecified: Secondary | ICD-10-CM

## 2023-01-23 NOTE — Telephone Encounter (Signed)
Requested Prescriptions  Pending Prescriptions Disp Refills   hydrOXYzine (ATARAX) 25 MG tablet [Pharmacy Med Name: HYDROXYZINE HCL 25 MG TABLET] 90 tablet 0    Sig: TAKE 0.5-1 TABLETS (12.5-25 MG TOTAL) BY MOUTH EVERY 8 (EIGHT) HOURS AS NEEDED FOR ANXIETY.     Ear, Nose, and Throat:  Antihistamines 2 Failed - 01/22/2023 10:47 AM      Failed - Cr in normal range and within 360 days    Creatinine  Date Value Ref Range Status  07/22/2012 0.96 0.60 - 1.30 mg/dL Final   Creatinine, Ser  Date Value Ref Range Status  11/13/2022 1.42 (H) 0.57 - 1.00 mg/dL Final         Passed - Valid encounter within last 12 months    Recent Outpatient Visits           2 months ago Type II diabetes mellitus with complication [facility])   [place] [facility] Medicine at [facility], Jesse Sans, MD   6 months ago Primary osteoarthritis of right knee   [place] at [facility] Jesse Sans, MD   10 months ago Essential (primary) hypertension   [place] [facility] Sports [facility] at [facility], Jesse Sans, MD   1 year ago Type II diabetes mellitus with complication [facility])    Primary Care & Sports Medicine at [facility], Jesse Sans, MD   1 year ago Type II diabetes mellitus with complication [facility])   [place] at [facility], Jesse Sans, MD       Future Appointments             In 1 month Army Melia, Jesse Sans, MD [place] at [facility], [facility]

## 2023-02-10 ENCOUNTER — Other Ambulatory Visit: Payer: Self-pay | Admitting: Internal Medicine

## 2023-02-11 ENCOUNTER — Ambulatory Visit (INDEPENDENT_AMBULATORY_CARE_PROVIDER_SITE_OTHER): Payer: Medicare Other

## 2023-02-11 ENCOUNTER — Ambulatory Visit
Admission: EM | Admit: 2023-02-11 | Discharge: 2023-02-11 | Disposition: A | Discharge status: Home / Self Care | Payer: Medicare Other | Attending: Physician Assistant | Admitting: Physician Assistant

## 2023-02-11 DIAGNOSIS — R0789 Other chest pain: Secondary | ICD-10-CM | POA: Diagnosis not present

## 2023-02-11 DIAGNOSIS — R079 Chest pain, unspecified: Secondary | ICD-10-CM | POA: Diagnosis not present

## 2023-02-11 DIAGNOSIS — S22080A Wedge compression fracture of T11-T12 vertebra, initial encounter for closed fracture: Secondary | ICD-10-CM

## 2023-02-11 NOTE — ED Triage Notes (Signed)
Pt presents with lt side chest pain X 1 week. States some days it hurts more than other days. Pt states she took tylenol this morning and states it hurts to sometimes breathe.

## 2023-02-11 NOTE — Discharge Instructions (Addendum)
-  The chest x-ray was negative for any acute process in the area of your chest wall tenderness.  It did show a compression fracture of the T11 vertebra which was age undetermined. -Can use ibuprofen and Tylenol as needed for pain -Can also use warm compresses -Follow with primary care as needed.

## 2023-02-11 NOTE — ED Provider Notes (Signed)
[facility]    CSN: [id number] Arrival date & time: 02/11/23  1426      History   Chief Complaint Chief Complaint  Patient presents with   Chest Pain    HPI Cathy Murphy is a 82 y.o. female.   Patient is a 82 year old female who presents with chief complaint of left upper chest pain times about 1 week.  Patient states pain was initially more in her shoulder and now more in the left upper chest.  Patient denies any fall or trauma.  Patient states pain is worse with movement and breathing.  Patient states she has not done much recently as she is had recent eye surgery has been sitting in a chair most of the time.    Past Medical History:  Diagnosis Date   Allergy    Anxiety    COVID-19 09/08/2021   Fever, chills, sneezing, runny nose.  Resolved.   Diabetes mellitus without complication    Glaucoma    Hyperlipidemia    Hypertension     Patient Active Problem List   Diagnosis Date Noted   Syncope 12/06/2021   Difficulty sleeping 12/29/2020   Mild cognitive impairment 12/29/2020   Headache disorder 12/29/2020   Stage 3a chronic kidney disease 08/16/2020   Osteopenia determined by x-ray 08/05/2019   Atherosclerosis of abdominal aorta 06/09/2019   Coronary artery disease involving native coronary artery of native heart 06/09/2019   BMI 24.0-24.9, adult 11/06/2018   Bilateral neck pain 10/08/2018   Aortic calcification 03/05/2018   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   Ventral hernia without obstruction or gangrene 03/06/2016   Type II diabetes mellitus with complication 11/09/2015   Plantar fasciitis, right 06/16/2015   Knee torn cartilage 04/26/2015   Allergic rhinitis, seasonal 04/26/2015   Essential (primary) hypertension 04/26/2015   Glaucoma 04/26/2015   Hyperlipidemia associated with type 2 diabetes mellitus 04/26/2015   Insomnia 04/26/2015   Osteoarthritis of left knee 05/03/2014    Past Surgical History:  Procedure Laterality Date   ABDOMINAL  HYSTERECTOMY     CATARACT EXTRACTION W/PHACO Left 10/01/2021   Procedure: CATARACT EXTRACTION PHACO AND INTRAOCULAR LENS PLACEMENT (IOC) LEFT HYDRUS MICROSTENT DIABETIC;  Surgeon: [place] CraneKing, Bradley Mark, MD;  [facility];  Service: Ophthalmology;  Laterality: Left;  Diabetic 7.49 00:42.1   CATARACT EXTRACTION W/PHACO Right 10/15/2021   Procedure: PHACO EMULSIFICATION  with HYDRUS MICROSTENT iMPLANTATION;  Surgeon: [place] CraneKing, Bradley Mark, MD;  [facility];  Service: Ophthalmology;  Laterality: Right;  Diabetic 4.98 00:27.3   COLONOSCOPY WITH PROPOFOL N/A 11/25/2017   Procedure: COLONOSCOPY WITH PROPOFOL;  Surgeon: Christena DeemSkulskie, Martin U, MD;  [facility];  Service: Endoscopy;  Laterality: N/A;   EYE SURGERY     LIPOMA RESECTION     right posterior neck    OB History   No obstetric history on file.      Home Medications    Prior to Admission medications   Medication Sig Start Date End Date Taking? Authorizing Provider  bimatoprost (LUMIGAN) 0.01 % SOLN Administer 1 drop into the left eye two (2) times a day. 08/22/21   [provider]  cholecalciferol (VITAMIN D) 1000 units tablet Take 2,000 Units by mouth daily.    [provider]  Cholecalciferol 25 MCG (1000 UT) tablet Take by mouth.    [provider]  ezetimibe (ZETIA) 10 MG tablet Take 10 mg by mouth daily. 06/09/19   [provider]  glipiZIDE (GLUCOTROL) 5 MG tablet Take  1 tablet (5 mg total) by mouth 2 (two) times daily. 07/11/22   Reubin Milan, MD  glucose blood test strip Use to test blood sugar up to twice daily. 12/06/21   Reubin Milan, MD  hydrOXYzine (ATARAX) 25 MG tablet TAKE 0.5-1 TABLETS (12.5-25 MG TOTAL) BY MOUTH EVERY 8 (EIGHT) HOURS AS NEEDED FOR ANXIETY. 01/23/23   Reubin Milan, MD  insulin glargine (LANTUS SOLOSTAR) 100 UNIT/ML Solostar Pen Inject 30 Units into the skin every morning AND 20 Units every evening. Patient taking  differently: Inject 30 Units into the skin every morning only - no evening dose 12/17/21   Reubin Milan, MD  Insulin Pen Needle (B-D UF III MINI PEN NEEDLES) 31G X 5 MM MISC USE AS DIRECTED WITH LEVEMIR 12/20/22   Reubin Milan, MD  ipratropium (ATROVENT) 0.06 % nasal spray Place 2 sprays into both nostrils 4 (four) times daily. 11/06/22   Becky Augusta, NP  JANUVIA 100 MG tablet TAKE 1 TABLET BY MOUTH EVERY DAY 02/11/23   Reubin Milan, MD  lisinopril (ZESTRIL) 40 MG tablet Take 1 tablet (40 mg total) by mouth daily. 07/11/22   Reubin Milan, MD  pioglitazone (ACTOS) 15 MG tablet TAKE 1 TABLET BY MOUTH EVERY DAY 08/09/22   Reubin Milan, MD  promethazine-dextromethorphan (PROMETHAZINE-DM) 6.25-15 MG/5ML syrup Take 5 mLs by mouth 4 (four) times daily as needed. 11/06/22   Becky Augusta, NP  rosuvastatin (CRESTOR) 40 MG tablet TAKE 1 TABLET BY MOUTH EVERY DAY 10/14/22   Reubin Milan, MD  spironolactone (ALDACTONE) 25 MG tablet Take 1 tablet (25 mg total) by mouth daily. 04/17/21   Reubin Milan, MD  traZODone (DESYREL) 50 MG tablet TAKE 1 TABLET BY MOUTH EVERYDAY AT BEDTIME 11/18/22   Reubin Milan, MD  albuterol [facility] HFA) [phone] Base) MCG/ACT inhaler Inhale 2 puffs into the lungs every 6 (six) hours as needed for wheezing or shortness of breath. 02/18/19 12/30/19  Reubin Milan, MD    Family History Family History  Problem Relation Age of Onset   Cancer Mother        Breast   Breast cancer Mother    Heart disease Father    Heart attack Brother    Cancer Maternal Aunt        breast   Breast cancer Maternal Aunt    Breast cancer Cousin        mat cousin    Social History Social History   Tobacco Use   Smoking status: Former    Packs/day: 1.00    Years: 7.00    Additional pack years: 0.00    Total pack years: 7.00    Types: Cigarettes    Quit date: 1965    Years since quitting: 59.3   Smokeless tobacco: Never   Tobacco comments:    smoking cessation  materials not required  Vaping Use   Vaping Use: Never used  Substance Use Topics   Alcohol use: No   Drug use: No     Allergies   Farxiga [dapagliflozin], Alprazolam, Amoxicillin, Aricept [donepezil], Aspirin, Metformin and related, Oxycodone, Riomet [metformin hcl], Sulfa antibiotics, Hydrocodone, Nabumetone, and Skelaxin [metaxalone]   Review of Systems Review of Systems \\as  above in HPI.  Other systems reviewed and found to be negative   Physical Exam Triage Vital Signs ED Triage Vitals  Enc Vitals Group     BP 02/11/23 1506 (!) 154/64     Pulse Rate 02/11/23  1506 66     Resp 02/11/23 1506 16     Temp 02/11/23 1506 98.1 F (36.7 C)     Temp Source 02/11/23 1506 Oral     SpO2 02/11/23 1506 97 %     Weight --      Height --      Head Circumference --      Peak Flow --      Pain Score 02/11/23 1459 6     Pain Loc --      Pain Edu? --      Excl. in GC? --    No data found.  Updated Vital Signs BP (!) 154/64 (BP Location: Right Arm)   Pulse 66   Temp 98.1 F (36.7 C) (Oral)   Resp 16   SpO2 97%   Visual Acuity Right Eye Distance:   Left Eye Distance:   Bilateral Distance:    Right Eye Near:   Left Eye Near:    Bilateral Near:     Physical Exam Constitutional:      Appearance: She is well-developed.  Eyes:     Extraocular Movements: Extraocular movements intact.  Cardiovascular:     Rate and Rhythm: Normal rate and regular rhythm.     Heart sounds: Normal heart sounds.  Pulmonary:     Effort: Pulmonary effort is normal.     Breath sounds: Normal breath sounds.  Chest:     Chest wall: Tenderness (L upper chest wall pain. reproducible with palpitation and deep breath. no pain with PROM) present.  Skin:    General: Skin is warm and dry.     Capillary Refill: Capillary refill takes less than 2 seconds.  Neurological:     Mental Status: She is alert.      UC Treatments / Results  Labs (all labs ordered are listed, but only abnormal results are  displayed) Labs Reviewed - No data to display  EKG -Normal sinus rhythm with a rate of 62.  QTc 406.  Septal infarct age undetermined per machine.  No acute ST changes.  Radiology DG Chest 2 View  Result Date: 02/11/2023 CLINICAL DATA:  L chest pain. EXAM: CHEST - 2 VIEW COMPARISON:  Radiograph 03/27/2016 FINDINGS: The heart size and mediastinal contours are within normal limits.No focal airspace disease. No pleural effusion or pneumothorax.There is an anterior compression deformity of T11 with approximately 20% height loss, age indeterminate. Mild thoracic spondylosis. IMPRESSION: No evidence of acute cardiopulmonary disease. Age-indeterminate compression deformity of T11 with approximately 20% height loss. Electronically Signed   By: Caprice Renshaw M.D.   On: 02/11/2023 15:47    Procedures Procedures (including critical care time)  Medications Ordered in UC Medications - No data to display  Initial Impression / Assessment and Plan / UC Course  I have reviewed the triage vital signs and the nursing notes.  Pertinent labs & imaging results that were available during my care of the patient were reviewed by me and considered in my medical decision making (see chart for details).     Patient presents with left upper chest wall pain for about 1 week.  Patient reports pain was initially more in her shoulder and now has drifted down to more in her left upper chest and around her breast.  Pain is worse with taking deep breaths as well as movement.  She denies any falls or any other trauma.  Patient also denies any repetitive movements that may have caused her pain.  Patient states that  she has actually been less active after recent eye surgery.  Pain is reproducible on exam.  EKG was negative.  Final Clinical Impressions(s) / UC Diagnoses   Final diagnoses:  Chest wall pain  Compression fracture of T11 vertebra, initial encounter     Discharge Instructions      -The chest x-ray was negative  for any acute process in the area of your chest wall tenderness.  It did show a compression fracture of the T11 vertebra which was age undetermined. -Can use ibuprofen and Tylenol as needed for pain -Can also use warm compresses -Follow with primary care as needed.     ED Prescriptions   None    PDMP not reviewed this encounter.   Candis Schatz, PA-C 02/11/23 1557

## 2023-02-11 NOTE — Telephone Encounter (Signed)
Requested Prescriptions  Pending Prescriptions Disp Refills   JANUVIA 100 MG tablet [Pharmacy Med Name: JANUVIA 100 MG TABLET] 90 tablet 0    Sig: TAKE 1 TABLET BY MOUTH EVERY DAY     Endocrinology:  Diabetes - DPP-4 Inhibitors Failed - 02/10/2023  2:44 AM      Failed - Cr in normal range and within 360 days    Creatinine  Date Value Ref Range Status  07/22/2012 0.96 0.60 - 1.30 mg/dL Final   Creatinine, Ser  Date Value Ref Range Status  11/13/2022 1.42 (H) 0.57 - 1.00 mg/dL Final         Passed - HBA1C is between 0 and 7.9 and within 180 days    Hemoglobin A1C  Date Value Ref Range Status  06/25/2022 6.4  Final   Hgb A1c MFr Bld  Date Value Ref Range Status  11/13/2022 7.4 (H) 4.8 - 5.6 % Final    Comment:             Prediabetes: 5.7 - 6.4          Diabetes: >6.4          Glycemic control for adults with diabetes: <7.0          Passed - Valid encounter within last 6 months    Recent Outpatient Visits           3 months ago Type II diabetes mellitus with complication [facility])   [place] [facility] at [facility], Nyoka Cowden, MD   7 months ago Primary osteoarthritis of right knee   [facility] at [facility], Nyoka Cowden, MD   11 months ago Essential (primary) hypertension   [place] [facility] at [facility], Nyoka Cowden, MD   1 year ago Type II diabetes mellitus with complication [facility])   [place] [facility] Medicine at [facility], Nyoka Cowden, MD   1 year ago Type II diabetes mellitus with complication [facility])   [place] [facility] Sports Medicine at [facility], Nyoka Cowden, MD       Future Appointments             In 1 month Judithann Graves, Nyoka Cowden, MD [facility] at [facility], [facility]

## 2023-02-11 NOTE — ED Triage Notes (Signed)
Pt denies SOB

## 2023-02-12 ENCOUNTER — Other Ambulatory Visit: Payer: Self-pay | Admitting: Internal Medicine

## 2023-02-12 DIAGNOSIS — E119 Type 2 diabetes mellitus without complications: Secondary | ICD-10-CM

## 2023-02-13 NOTE — Telephone Encounter (Signed)
Requested Prescriptions  Pending Prescriptions Disp Refills   pioglitazone (ACTOS) 15 MG tablet [Pharmacy Med Name: PIOGLITAZONE HCL 15 MG TABLET] 90 tablet 0    Sig: TAKE 1 TABLET BY MOUTH EVERY DAY     Endocrinology:  Diabetes - Glitazones - pioglitazone Passed - 02/12/2023  2:58 PM      Passed - HBA1C is between 0 and 7.9 and within 180 days    Hemoglobin A1C  Date Value Ref Range Status  06/25/2022 6.4  Final   Hgb A1c MFr Bld  Date Value Ref Range Status  11/13/2022 7.4 (H) 4.8 - 5.6 % Final    Comment:             Prediabetes: 5.7 - 6.4          Diabetes: >6.4          Glycemic control for adults with diabetes: <7.0          Passed - Valid encounter within last 6 months    Recent Outpatient Visits           3 months ago Type II diabetes mellitus with complication [facility])   [place] [facility] at [facility], Nyoka Cowden, MD   7 months ago Primary osteoarthritis of right knee   [facility] at [facility], Nyoka Cowden, MD   11 months ago Essential (primary) hypertension   [place] [facility] at [facility], Nyoka Cowden, MD   1 year ago Type II diabetes mellitus with complication [facility])   [place] [facility] Sports Medicine at [facility], Nyoka Cowden, MD   1 year ago Type II diabetes mellitus with complication [facility])   [place] [facility] Sports Medicine at [facility], Nyoka Cowden, MD       Future Appointments             In 4 weeks Judithann Graves Nyoka Cowden, MD [facility] Primary [facility] & Sports Medicine at [facility], [facility]

## 2023-02-14 ENCOUNTER — Other Ambulatory Visit: Payer: Self-pay | Admitting: Internal Medicine

## 2023-02-14 NOTE — Telephone Encounter (Signed)
Requested Prescriptions  Pending Prescriptions Disp Refills   traZODone (DESYREL) 50 MG tablet [Pharmacy Med Name: TRAZODONE 50 MG TABLET] 90 tablet 0    Sig: TAKE 1 TABLET BY MOUTH EVERYDAY AT BEDTIME     Psychiatry: Antidepressants - Serotonin Modulator Passed - 02/14/2023  2:21 AM      Passed - Valid encounter within last 6 months    Recent Outpatient Visits           3 months ago Type II diabetes mellitus with complication [facility])   [place] [facility] at [facility], Nyoka Cowden, MD   7 months ago Primary osteoarthritis of right knee   [facility] at [facility], Nyoka Cowden, MD   11 months ago Essential (primary) hypertension   [place] [facility] Medicine at [facility], Nyoka Cowden, MD   1 year ago Type II diabetes mellitus with complication [facility])   [place] Primary Care & Sports Medicine at [facility], Nyoka Cowden, MD   1 year ago Type II diabetes mellitus with complication [facility])   [place] [facility] Sports Medicine at [facility], Nyoka Cowden, MD       Future Appointments             In 4 weeks Judithann Graves Nyoka Cowden, MD [facility] Primary Care & Sports Medicine at [facility], [facility]

## 2023-02-15 ENCOUNTER — Other Ambulatory Visit: Payer: Self-pay | Admitting: Internal Medicine

## 2023-02-15 DIAGNOSIS — F419 Anxiety disorder, unspecified: Secondary | ICD-10-CM

## 2023-03-03 ENCOUNTER — Other Ambulatory Visit: Payer: Self-pay | Admitting: Internal Medicine

## 2023-03-03 DIAGNOSIS — E118 Type 2 diabetes mellitus with unspecified complications: Secondary | ICD-10-CM

## 2023-03-12 ENCOUNTER — Other Ambulatory Visit: Payer: Self-pay | Admitting: Internal Medicine

## 2023-03-12 DIAGNOSIS — E118 Type 2 diabetes mellitus with unspecified complications: Secondary | ICD-10-CM

## 2023-03-12 NOTE — Telephone Encounter (Signed)
Requested Prescriptions  Pending Prescriptions Disp Refills   ONETOUCH ULTRA test strip [Pharmacy Med Name: ONE TOUCH ULTRA BLUE TEST STRP] 100 strip 0    Sig: USE TO TEST BLOOD SUGAR UP TO TWICE DAILY.     Endocrinology: Diabetes - Testing Supplies Passed - 03/12/2023  9:48 AM      Passed - Valid encounter within last 12 months    Recent Outpatient Visits           3 months ago Type II diabetes mellitus with complication [facility])   [place] [facility] Medicine at [facility], Nyoka Cowden, MD   8 months ago Primary osteoarthritis of right knee   [facility] at [facility], Nyoka Cowden, MD   1 year ago Essential (primary) hypertension   [place] [facility] Medicine at [facility], Nyoka Cowden, MD   1 year ago Type II diabetes mellitus with complication [facility])   [place] [facility] Sports Medicine at [facility], Nyoka Cowden, MD   1 year ago Type II diabetes mellitus with complication [facility])   [place] Primary Care [facility] Sports Medicine at [facility], Nyoka Cowden, MD       Future Appointments             In 2 days Judithann Graves Nyoka Cowden, MD [facility] Primary Care & Sports Medicine at [facility], [facility]

## 2023-03-14 ENCOUNTER — Ambulatory Visit (INDEPENDENT_AMBULATORY_CARE_PROVIDER_SITE_OTHER): Payer: Medicare Other | Admitting: Internal Medicine

## 2023-03-14 ENCOUNTER — Encounter: Payer: Self-pay | Admitting: Internal Medicine

## 2023-03-14 VITALS — BP 126/64 | HR 84 | Ht 69.0 in | Wt 185.0 lb

## 2023-03-14 DIAGNOSIS — Z794 Long term (current) use of insulin: Secondary | ICD-10-CM | POA: Diagnosis not present

## 2023-03-14 DIAGNOSIS — I10 Essential (primary) hypertension: Secondary | ICD-10-CM

## 2023-03-14 DIAGNOSIS — Z7984 Long term (current) use of oral hypoglycemic drugs: Secondary | ICD-10-CM | POA: Diagnosis not present

## 2023-03-14 DIAGNOSIS — I2510 Atherosclerotic heart disease of native coronary artery without angina pectoris: Secondary | ICD-10-CM

## 2023-03-14 DIAGNOSIS — N1831 Chronic kidney disease, stage 3a: Secondary | ICD-10-CM | POA: Diagnosis not present

## 2023-03-14 DIAGNOSIS — E118 Type 2 diabetes mellitus with unspecified complications: Secondary | ICD-10-CM

## 2023-03-14 MED ORDER — LISINOPRIL 40 MG PO TABS: 40.0000 mg | ORAL_TABLET | Freq: Every day | ORAL | 1 refills | Status: DC

## 2023-03-14 MED ORDER — GLIPIZIDE 5 MG PO TABS: 5.0000 mg | ORAL_TABLET | Freq: Two times a day (BID) | ORAL | 1 refills | Status: DC

## 2023-03-14 NOTE — Assessment & Plan Note (Addendum)
Stable exam with well controlled BP.  Currently taking lisinopril and spironolactone. Tolerating medications without concerns or side effects. Will continue to recommend low sodium diet and current regimen.

## 2023-03-14 NOTE — Progress Notes (Signed)
Date:  03/14/2023   Name:  Cathy Murphy   DOB:  [birth year]   MRN:  [id number]   Chief Complaint: Diabetes and Hypertension  Diabetes She presents for her follow-up diabetic visit. She has type 2 diabetes mellitus. Her disease course has been stable. Pertinent negatives for hypoglycemia include no headaches, nervousness/anxiousness or tremors. Pertinent negatives for diabetes include no chest pain, no fatigue, no polydipsia and no polyuria. Current diabetic treatment includes oral agent (triple therapy) and insulin injections.  Hypertension This is a chronic problem. The problem is controlled. Associated symptoms include shortness of breath (on exertion). Pertinent negatives include no chest pain, headaches or palpitations. Past treatments include ACE inhibitors. Hypertensive end-organ damage includes kidney disease.  CAD - has not been seen in about 18 months.  No chest pain but she has to stop during housework due to fatigue and vague chest heaviness.     Lab Results  Component Value Date   NA 142 11/13/2022   K 4.4 11/13/2022   CO2 25 11/13/2022   GLUCOSE 114 (H) 11/13/2022   BUN 17 11/13/2022   CREATININE 1.42 (H) 11/13/2022   CALCIUM 9.8 11/13/2022   EGFR 38 (L) 11/13/2022   GFRNONAA 38 (L) 08/16/2020   Lab Results  Component Value Date   CHOL 168 11/13/2022   HDL 60 11/13/2022   LDLCALC 86 11/13/2022   TRIG 122 11/13/2022   CHOLHDL 2.8 11/13/2022   Lab Results  Component Value Date   TSH 2.320 08/29/2021   Lab Results  Component Value Date   HGBA1C 7.4 (H) 11/13/2022   Lab Results  Component Value Date   WBC 6.5 08/29/2021   HGB 14.2 08/29/2021   HCT 44.6 08/29/2021   MCV 88 08/29/2021   PLT 182 08/29/2021   Lab Results  Component Value Date   ALT 13 08/29/2021   AST 17 08/29/2021   ALKPHOS 67 08/29/2021   BILITOT 1.5 (H) 08/29/2021   Lab Results  Component Value Date   VD25OH 12.8 (L) 06/15/2015     Review of Systems  Constitutional:  Negative  for appetite change, fatigue, fever and unexpected weight change.  HENT:  Negative for tinnitus and trouble swallowing.   Eyes:  Negative for visual disturbance.  Respiratory:  Positive for shortness of breath (on exertion). Negative for cough and chest tightness.   Cardiovascular:  Negative for chest pain, palpitations and leg swelling.  Gastrointestinal:  Negative for abdominal pain.  Endocrine: Negative for polydipsia and polyuria.  Genitourinary:  Negative for dysuria and hematuria.  Musculoskeletal:  Positive for arthralgias and gait problem (left knee OA).  Neurological:  Negative for tremors, numbness and headaches.  Psychiatric/Behavioral:  Negative for dysphoric mood and sleep disturbance. The patient is not nervous/anxious.     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   Ventral hernia without obstruction or gangrene 03/06/2016   Type II diabetes mellitus with complication (HCC) 11/09/2015   Plantar fasciitis, right 06/16/2015   Knee torn cartilage 04/26/2015  Allergic rhinitis, seasonal 04/26/2015   Essential (primary) hypertension 04/26/2015   Glaucoma 04/26/2015   Hyperlipidemia associated with type 2 diabetes mellitus (HCC) 04/26/2015   Insomnia 04/26/2015   Osteoarthritis of left knee 05/03/2014    Allergies  Allergen Reactions   Farxiga [Dapagliflozin] Itching    Genital yeast infection   Alprazolam Nausea Only   Amoxicillin Diarrhea   Aricept [Donepezil]  Other (See Comments)    Leg cramps   Aspirin Nausea And Vomiting   Metformin And Related Diarrhea   Oxycodone Other (See Comments)    CNS side effects/agitation   Riomet [Metformin Hcl] Nausea And Vomiting   Sulfa Antibiotics    Hydrocodone Rash   Nabumetone Rash   Skelaxin [Metaxalone] Rash    Past Surgical History:  Procedure Laterality Date   ABDOMINAL HYSTERECTOMY     CATARACT EXTRACTION W/PHACO Left 10/01/2021   Procedure: CATARACT EXTRACTION PHACO AND INTRAOCULAR LENS PLACEMENT (IOC) LEFT HYDRUS MICROSTENT DIABETIC;  Surgeon: Nevada Crane, MD;  Location: [facility];  Service: Ophthalmology;  Laterality: Left;  Diabetic 7.49 00:42.1   CATARACT EXTRACTION W/PHACO Right 10/15/2021   Procedure: PHACO EMULSIFICATION  with HYDRUS MICROSTENT iMPLANTATION;  Surgeon: Nevada Crane, MD;  Location: [facility];  Service: Ophthalmology;  Laterality: Right;  Diabetic 4.98 00:27.3   COLONOSCOPY WITH PROPOFOL N/A 11/25/2017   Procedure: COLONOSCOPY WITH PROPOFOL;  Surgeon: Christena Deem, MD;  [facility];  Service: Endoscopy;  Laterality: N/A;   EYE SURGERY     LIPOMA RESECTION     right posterior neck    Social History   Tobacco Use   Smoking status: Former    Packs/day: 1.00    Years: 7.00    Additional pack years: 0.00    Total pack years: 7.00    Types: Cigarettes    Quit date: 29    Years since quitting: 59.3   Smokeless tobacco: Never   Tobacco comments:    smoking cessation materials not required  Vaping Use   Vaping Use: Never used  Substance Use Topics   Alcohol use: No   Drug use: No     Medication list has been reviewed and updated.  Current Meds  Medication Sig   bimatoprost (LUMIGAN) 0.01 % SOLN Administer 1 drop into the left eye two (2) times a day.   cholecalciferol (VITAMIN D) 1000 units tablet Take 2,000 Units by mouth daily.   Cholecalciferol 25 MCG (1000 UT) tablet Take by mouth.   ezetimibe (ZETIA)  10 MG tablet Take 10 mg by mouth daily.   glucose blood (ONETOUCH ULTRA) test strip USE TO TEST BLOOD SUGAR UP TO TWICE DAILY.   hydrOXYzine (ATARAX) 25 MG tablet TAKE 0.5-1 TABLETS (12.5-25 MG TOTAL) BY MOUTH EVERY 8 (EIGHT) HOURS AS NEEDED FOR ANXIETY.   insulin glargine (LANTUS SOLOSTAR) 100 UNIT/ML Solostar Pen Inject 30 Units into the skin every morning only - no evening dose   Insulin Pen Needle (B-D UF III MINI PEN NEEDLES) 31G X 5 MM MISC USE AS DIRECTED WITH LEVEMIR   JANUVIA 100 MG tablet TAKE 1 TABLET BY MOUTH EVERY DAY   pioglitazone (ACTOS) 15 MG tablet TAKE 1 TABLET BY MOUTH EVERY DAY   rosuvastatin (CRESTOR) 40 MG tablet TAKE 1 TABLET BY MOUTH EVERY DAY   spironolactone (ALDACTONE) 25 MG tablet Take 1 tablet (25 mg total) by mouth daily.   traZODone (DESYREL) 50 MG tablet TAKE 1 TABLET BY MOUTH EVERYDAY AT BEDTIME   [DISCONTINUED] glipiZIDE (  GLUCOTROL) 5 MG tablet Take 1 tablet (5 mg total) by mouth 2 (two) times daily.   [DISCONTINUED] ipratropium (ATROVENT) 0.06 % nasal spray Place 2 sprays into both nostrils 4 (four) times daily.   [DISCONTINUED] lisinopril (ZESTRIL) 40 MG tablet Take 1 tablet (40 mg total) by mouth daily.       03/14/2023   10:10 AM 11/13/2022    9:55 AM 07/11/2022   10:45 AM 03/07/2022   10:22 AM  GAD 7 : Generalized Anxiety Score  Nervous, Anxious, on Edge 1 0 0 1  Control/stop worrying 1 0 0 1  Worry too much - different things 1 0 0 1  Trouble relaxing 1 0 0 1  Restless 1 0 0 1  Easily annoyed or irritable 0 0 0 0  Afraid - awful might happen 0 0 0 0  Total GAD 7 Score 5 0 0 5  Anxiety Difficulty Not difficult at all Not difficult at all Not difficult at all Not difficult at all       03/14/2023   10:10 AM 11/13/2022    9:55 AM 07/17/2022    9:58 AM  Depression screen PHQ 2/9  Decreased Interest 1 1 1   Down, Depressed, Hopeless 1 0 0  PHQ - 2 Score 2 1 1   Altered sleeping 2 0 1  Tired, decreased energy 2 1 1   Change in appetite 0 0 0   Feeling bad or failure about yourself  0 0 0  Trouble concentrating 1 1 1   Moving slowly or fidgety/restless 2 0 0  Suicidal thoughts 0 0 0  PHQ-9 Score 9 3 4   Difficult doing work/chores Somewhat difficult Not difficult at all Not difficult at all    BP Readings from Last 3 Encounters:  03/14/23 126/64  02/11/23 (!) 154/64  11/13/22 118/66    Physical Exam Vitals and nursing note reviewed.  Constitutional:      General: She is not in acute distress.    Appearance: She is well-developed.  HENT:     Head: Normocephalic and atraumatic.  Neck:     Vascular: No carotid bruit.  Cardiovascular:     Rate and Rhythm: Normal rate and regular rhythm.     Pulses: Normal pulses.  Pulmonary:     Effort: Pulmonary effort is normal. No respiratory distress.     Breath sounds: No wheezing or rhonchi.  Musculoskeletal:        General: Tenderness (left knee) present.     Cervical back: Normal range of motion. No rigidity.     Right lower leg: No edema.     Left lower leg: No edema.  Skin:    General: Skin is warm and dry.     Capillary Refill: Capillary refill takes less than 2 seconds.     Findings: No rash.  Neurological:     General: No focal deficit present.     Mental Status: She is alert and oriented to person, place, and time.  Psychiatric:        Mood and Affect: Mood normal.        Behavior: Behavior normal.     Wt Readings from Last 3 Encounters:  03/14/23 185 lb (83.9 kg)  11/13/22 183 lb (83 kg)  11/06/22 182 lb (82.6 kg)    BP 126/64   Pulse 84   Ht 5\' 9"  (1.753 m)   Wt 185 lb (83.9 kg)   SpO2 96%   BMI 27.32 kg/m   Assessment and Plan:  Problem List Items Addressed This Visit       Cardiovascular and Mediastinum   Coronary artery disease involving native coronary artery of native heart (Chronic)    Previously seen by Dr. Gwen Pounds who has retired Now having some vague fatigue and DOE      Relevant Medications   lisinopril (ZESTRIL) 40 MG tablet    Other Relevant Orders   Ambulatory referral to Cardiology   Essential (primary) hypertension (Chronic)    Stable exam with well controlled BP.  Currently taking lisinopril and spironolactone. Tolerating medications without concerns or side effects. Will continue to recommend low sodium diet and current regimen.       Relevant Medications   lisinopril (ZESTRIL) 40 MG tablet     Endocrine   Type II diabetes mellitus with complication (HCC) - Primary (Chronic)    Blood sugars stable without hypoglycemic symptoms or events. Currently being treated with actos, glipizide, januvia and insulin. Lab Results  Component Value Date   HGBA1C 7.4 (H) 11/13/2022  Due for U/Cr.       Relevant Medications   glipiZIDE (GLUCOTROL) 5 MG tablet   lisinopril (ZESTRIL) 40 MG tablet   Other Relevant Orders   Hemoglobin A1c   Microalbumin / creatinine urine ratio     Genitourinary   Stage 3a chronic kidney disease (HCC) (Chronic)   Relevant Orders   Basic metabolic panel    Return in about 4 months (around 07/15/2023) for DM, HTN.   Partially dictated using Dragon software, any errors are not intentional.  Reubin Milan, MD [facility] [place], [place]

## 2023-03-14 NOTE — Assessment & Plan Note (Signed)
Previously seen by Dr. Gwen Pounds who has retired Now having some vague fatigue and DOE

## 2023-03-14 NOTE — Assessment & Plan Note (Signed)
Blood sugars stable without hypoglycemic symptoms or events. Currently being treated with actos, glipizide, januvia and insulin. Lab Results  Component Value Date   HGBA1C 7.4 (H) 11/13/2022  Due for U/Cr.

## 2023-03-15 LAB — MICROALBUMIN / CREATININE URINE RATIO
Creatinine, Urine: 142.8 mg/dL
Microalb/Creat Ratio: 100 mg/g creat — ABNORMAL HIGH (ref 0–29)
Microalbumin, Urine: 142.6 ug/mL

## 2023-03-15 LAB — BASIC METABOLIC PANEL
BUN/Creatinine Ratio: 14 (ref 12–28)
BUN: 21 mg/dL (ref 8–27)
CO2: 22 mmol/L (ref 20–29)
Calcium: 9.6 mg/dL (ref 8.7–10.3)
Chloride: 100 mmol/L (ref 96–106)
Creatinine, Ser: 1.55 mg/dL — ABNORMAL HIGH (ref 0.57–1.00)
Glucose: 100 mg/dL — ABNORMAL HIGH (ref 70–99)
Potassium: 4.4 mmol/L (ref 3.5–5.2)
Sodium: 140 mmol/L (ref 134–144)
eGFR: 34 mL/min/{1.73_m2} — ABNORMAL LOW (ref >59)

## 2023-03-15 LAB — HEMOGLOBIN A1C
Est. average glucose Bld gHb Est-mCnc: 177 mg/dL
Hgb A1c MFr Bld: 7.8 % — ABNORMAL HIGH (ref 4.8–5.6)

## 2023-03-17 NOTE — Progress Notes (Signed)
PC to pt, discussed labs, pt voiced understanding.

## 2023-04-01 ENCOUNTER — Other Ambulatory Visit: Payer: Self-pay | Admitting: Internal Medicine

## 2023-04-07 ENCOUNTER — Other Ambulatory Visit: Payer: Self-pay | Admitting: Internal Medicine

## 2023-04-07 DIAGNOSIS — E1169 Type 2 diabetes mellitus with other specified complication: Secondary | ICD-10-CM

## 2023-04-28 DIAGNOSIS — T85398A Other mechanical complication of other ocular prosthetic devices, implants and grafts, initial encounter: Secondary | ICD-10-CM | POA: Diagnosis not present

## 2023-04-28 DIAGNOSIS — Z961 Presence of intraocular lens: Secondary | ICD-10-CM | POA: Diagnosis not present

## 2023-04-28 DIAGNOSIS — H209 Unspecified iridocyclitis: Secondary | ICD-10-CM | POA: Diagnosis not present

## 2023-04-28 DIAGNOSIS — H401111 Primary open-angle glaucoma, right eye, mild stage: Secondary | ICD-10-CM | POA: Diagnosis not present

## 2023-04-29 DIAGNOSIS — R600 Localized edema: Secondary | ICD-10-CM | POA: Diagnosis not present

## 2023-04-29 DIAGNOSIS — R0609 Other forms of dyspnea: Secondary | ICD-10-CM | POA: Diagnosis not present

## 2023-04-29 DIAGNOSIS — E785 Hyperlipidemia, unspecified: Secondary | ICD-10-CM | POA: Diagnosis not present

## 2023-04-29 DIAGNOSIS — I700 Atherosclerosis of aorta: Secondary | ICD-10-CM | POA: Diagnosis not present

## 2023-04-29 DIAGNOSIS — I10 Essential (primary) hypertension: Secondary | ICD-10-CM | POA: Diagnosis not present

## 2023-04-29 DIAGNOSIS — I2510 Atherosclerotic heart disease of native coronary artery without angina pectoris: Secondary | ICD-10-CM | POA: Diagnosis not present

## 2023-04-29 DIAGNOSIS — E1169 Type 2 diabetes mellitus with other specified complication: Secondary | ICD-10-CM | POA: Diagnosis not present

## 2023-05-07 DIAGNOSIS — H5989 Other postprocedural complications and disorders of eye and adnexa, not elsewhere classified: Secondary | ICD-10-CM | POA: Diagnosis not present

## 2023-05-12 ENCOUNTER — Other Ambulatory Visit: Payer: Self-pay | Admitting: Internal Medicine

## 2023-05-13 ENCOUNTER — Other Ambulatory Visit: Payer: Self-pay | Admitting: Internal Medicine

## 2023-05-13 DIAGNOSIS — Z794 Long term (current) use of insulin: Secondary | ICD-10-CM

## 2023-05-14 DIAGNOSIS — R600 Localized edema: Secondary | ICD-10-CM | POA: Diagnosis not present

## 2023-05-14 DIAGNOSIS — R0609 Other forms of dyspnea: Secondary | ICD-10-CM | POA: Diagnosis not present

## 2023-05-28 DIAGNOSIS — E113392 Type 2 diabetes mellitus with moderate nonproliferative diabetic retinopathy without macular edema, left eye: Secondary | ICD-10-CM | POA: Diagnosis not present

## 2023-05-28 DIAGNOSIS — Z961 Presence of intraocular lens: Secondary | ICD-10-CM | POA: Diagnosis not present

## 2023-05-28 DIAGNOSIS — H35351 Cystoid macular degeneration, right eye: Secondary | ICD-10-CM | POA: Diagnosis not present

## 2023-06-10 DIAGNOSIS — I10 Essential (primary) hypertension: Secondary | ICD-10-CM | POA: Diagnosis not present

## 2023-06-10 DIAGNOSIS — I700 Atherosclerosis of aorta: Secondary | ICD-10-CM | POA: Diagnosis not present

## 2023-06-10 DIAGNOSIS — R0609 Other forms of dyspnea: Secondary | ICD-10-CM | POA: Diagnosis not present

## 2023-06-10 DIAGNOSIS — E785 Hyperlipidemia, unspecified: Secondary | ICD-10-CM | POA: Diagnosis not present

## 2023-06-10 DIAGNOSIS — I2510 Atherosclerotic heart disease of native coronary artery without angina pectoris: Secondary | ICD-10-CM | POA: Diagnosis not present

## 2023-06-10 DIAGNOSIS — R600 Localized edema: Secondary | ICD-10-CM | POA: Diagnosis not present

## 2023-07-02 DIAGNOSIS — T8522XD Displacement of intraocular lens, subsequent encounter: Secondary | ICD-10-CM | POA: Diagnosis not present

## 2023-07-02 DIAGNOSIS — H35351 Cystoid macular degeneration, right eye: Secondary | ICD-10-CM | POA: Diagnosis not present

## 2023-07-02 DIAGNOSIS — E1169 Type 2 diabetes mellitus with other specified complication: Secondary | ICD-10-CM | POA: Diagnosis not present

## 2023-07-02 DIAGNOSIS — E113392 Type 2 diabetes mellitus with moderate nonproliferative diabetic retinopathy without macular edema, left eye: Secondary | ICD-10-CM | POA: Diagnosis not present

## 2023-07-08 ENCOUNTER — Other Ambulatory Visit: Payer: Self-pay | Admitting: Internal Medicine

## 2023-07-16 ENCOUNTER — Emergency Department
Admission: EM | Admit: 2023-07-16 | Discharge: 2023-07-16 | Disposition: A | Discharge status: Home / Self Care | Payer: Medicare Other | Attending: Student in an Organized Health Care Education/Training Program | Admitting: Student in an Organized Health Care Education/Training Program

## 2023-07-16 ENCOUNTER — Emergency Department: Payer: Medicare Other

## 2023-07-16 DIAGNOSIS — M25572 Pain in left ankle and joints of left foot: Secondary | ICD-10-CM | POA: Diagnosis not present

## 2023-07-16 DIAGNOSIS — Z1152 Encounter for screening for COVID-19: Secondary | ICD-10-CM | POA: Insufficient documentation

## 2023-07-16 DIAGNOSIS — M25571 Pain in right ankle and joints of right foot: Secondary | ICD-10-CM | POA: Insufficient documentation

## 2023-07-16 DIAGNOSIS — W19XXXA Unspecified fall, initial encounter: Secondary | ICD-10-CM | POA: Diagnosis not present

## 2023-07-16 DIAGNOSIS — E860 Dehydration: Secondary | ICD-10-CM | POA: Insufficient documentation

## 2023-07-16 DIAGNOSIS — W1839XA Other fall on same level, initial encounter: Secondary | ICD-10-CM | POA: Diagnosis not present

## 2023-07-16 DIAGNOSIS — M7731 Calcaneal spur, right foot: Secondary | ICD-10-CM | POA: Diagnosis not present

## 2023-07-16 DIAGNOSIS — R1110 Vomiting, unspecified: Secondary | ICD-10-CM | POA: Diagnosis present

## 2023-07-16 DIAGNOSIS — R1084 Generalized abdominal pain: Secondary | ICD-10-CM | POA: Diagnosis not present

## 2023-07-16 LAB — CBC
HCT: 44.7 % (ref 36.0–46.0)
Hemoglobin: 13.6 g/dL (ref 12.0–15.0)
MCH: 29.3 pg (ref 26.0–34.0)
MCHC: 30.4 g/dL (ref 30.0–36.0)
MCV: 96.3 fL (ref 80.0–100.0)
Platelets: 228 10*3/uL (ref 150–400)
RBC: 4.64 MIL/uL (ref 3.87–5.11)
RDW: 13.3 % (ref 11.5–15.5)
WBC: 10.5 10*3/uL (ref 4.0–10.5)
nRBC: 0 % (ref 0.0–0.2)

## 2023-07-16 LAB — COMPREHENSIVE METABOLIC PANEL
ALT: 12 U/L (ref 0–44)
AST: 19 U/L (ref 15–41)
Albumin: 3.5 g/dL (ref 3.5–5.0)
Alkaline Phosphatase: 41 U/L (ref 38–126)
Anion gap: 13 (ref 5–15)
BUN: 17 mg/dL (ref 8–23)
CO2: 22 mmol/L (ref 22–32)
Calcium: 9.1 mg/dL (ref 8.9–10.3)
Chloride: 104 mmol/L (ref 98–111)
Creatinine, Ser: 1.28 mg/dL — ABNORMAL HIGH (ref 0.44–1.00)
GFR, Estimated: 43 mL/min — ABNORMAL LOW (ref >60)
Glucose, Bld: 148 mg/dL — ABNORMAL HIGH (ref 70–99)
Potassium: 4 mmol/L (ref 3.5–5.1)
Sodium: 139 mmol/L (ref 135–145)
Total Bilirubin: 1.5 mg/dL — ABNORMAL HIGH (ref 0.3–1.2)
Total Protein: 7 g/dL (ref 6.5–8.1)

## 2023-07-16 LAB — URINALYSIS, ROUTINE W REFLEX MICROSCOPIC
Bilirubin Urine: NEGATIVE
Glucose, UA: 50 mg/dL — AB
Ketones, ur: 5 mg/dL — AB
Leukocytes,Ua: NEGATIVE
Nitrite: NEGATIVE
Protein, ur: 100 mg/dL — AB
Specific Gravity, Urine: 1.025 (ref 1.005–1.030)
pH: 5 (ref 5.0–8.0)

## 2023-07-16 LAB — TROPONIN I (HIGH SENSITIVITY): Troponin I (High Sensitivity): 4 ng/L (ref <18)

## 2023-07-16 LAB — LIPASE, BLOOD: Lipase: 22 U/L (ref 11–51)

## 2023-07-16 LAB — SARS CORONAVIRUS 2 BY RT PCR: SARS Coronavirus 2 by RT PCR: NEGATIVE

## 2023-07-16 MED ORDER — SODIUM CHLORIDE 0.9 % IV BOLUS
500.0000 mL | Freq: Once | INTRAVENOUS | Status: AC
  Administered 2023-07-16: 500 mL via INTRAVENOUS

## 2023-07-16 MED ORDER — ACETAMINOPHEN 325 MG PO TABS
650.0000 mg | ORAL_TABLET | Freq: Once | ORAL | Status: AC
  Administered 2023-07-16: 650 mg via ORAL
  Filled 2023-07-16: qty 2

## 2023-07-16 NOTE — ED Provider Notes (Signed)
[facility] Provider Note    Event Date/Time   First MD Initiated Contact with Patient 07/16/23 1829     (approximate)   History   Fall and Abdominal Pain   HPI  Cathy Murphy is a 82 y.o. female who presents to the ER for evaluation of bilateral ankle pain.  Patient had a fall today where she was having some abdominal discomfort cramping sensation like she needed to have a bowel movement she got up to get something to eat when she was standing up started feeling chills and weak.  Denied any palpitations.  No nausea or vomiting.  No chest pain.  She fell to the ground did not hit her head.  No LOC.  Does think that she rolled her ankle.  Left hurting worse than right.  Does have history of chronic arthritis.  She denies any abdominal pain at this time.  No nausea or vomiting.     Physical Exam   Triage Vital Signs: ED Triage Vitals  Encounter Vitals Group     BP 07/16/23 1721 115/65     Systolic BP Percentile --      Diastolic BP Percentile --      Pulse Rate 07/16/23 1721 (!) 102     Resp 07/16/23 1721 16     Temp 07/16/23 1721 98.4 F (36.9 C)     Temp Source 07/16/23 1721 Oral     SpO2 07/16/23 1721 97 %     Weight 07/16/23 1722 185 lb (83.9 kg)     Height 07/16/23 1722 5\' 9"  (1.753 m)     Head Circumference --      Peak Flow --      Pain Score 07/16/23 1721 6     Pain Loc --      Pain Education --      Exclude from Growth Chart --     Most recent vital signs: Vitals:   07/16/23 1930 07/16/23 2030  BP: (!) 156/78 139/71  Pulse: (!) 47   Resp:  12  Temp:    SpO2:       Constitutional: Alert  Eyes: Conjunctivae are normal.  Head: Atraumatic. Nose: No congestion/rhinnorhea. Mouth/Throat: Mucous membranes are moist.   Neck: Painless ROM.  Cardiovascular:   Good peripheral circulation. Respiratory: Normal respiratory effort.  No retractions.  Gastrointestinal: Soft and nontender in all 4 quadrants..  Musculoskeletal: Swelling of  bilateral ankles bilateral tenderness to palpation on the distal lateral malleolus Neurologic:  MAE spontaneously. No gross focal neurologic deficits are appreciated.  Skin:  Skin is warm, dry and intact. No rash noted. Psychiatric: Mood and affect are normal. Speech and behavior are normal.    ED Results / Procedures / Treatments   Labs (all labs ordered are listed, but only abnormal results are displayed) Labs Reviewed  COMPREHENSIVE METABOLIC PANEL - Abnormal; Notable for the following components:      Result Value   Glucose, Bld 148 (*)    Creatinine, Ser 1.28 (*)    Total Bilirubin 1.5 (*)    GFR, Estimated 43 (*)    All other components within normal limits  URINALYSIS, ROUTINE W REFLEX MICROSCOPIC - Abnormal; Notable for the following components:   Color, Urine AMBER (*)    APPearance CLOUDY (*)    Glucose, UA 50 (*)    Hgb urine dipstick SMALL (*)    Ketones, ur 5 (*)    Protein, ur 100 (*)    Bacteria, UA  RARE (*)    All other components within normal limits  SARS CORONAVIRUS 2 BY RT PCR  LIPASE, BLOOD  CBC  TROPONIN I (HIGH SENSITIVITY)  TROPONIN I (HIGH SENSITIVITY)     EKG  ED ECG REPORT I, Willy Eddy, the attending physician, personally viewed and interpreted this ECG.   Date: 07/16/2023  EKG Time: 17:23  Rate: 100  Rhythm: sinus  Axis: normal  Intervals:normal  ST&T Change: nonspecific st abn    RADIOLOGY Please see ED Course for my review and interpretation.  I personally reviewed all radiographic images ordered to evaluate for the above acute complaints and reviewed radiology reports and findings.  These findings were personally discussed with the patient.  Please see medical record for radiology report.    PROCEDURES:  Critical Care performed: No  Procedures   MEDICATIONS ORDERED IN ED: Medications  acetaminophen (TYLENOL) tablet 650 mg (650 mg Oral Given 07/16/23 2009)  sodium chloride 0.9 % bolus 500 mL (500 mLs Intravenous  New Bag/Given 07/16/23 2131)     IMPRESSION / MDM / ASSESSMENT AND PLAN / ED COURSE  I reviewed the triage vital signs and the nursing notes.                              Differential diagnosis includes, but is not limited to, fracture, contusion, dehydration, electrolyte abnormality, dysrhythmia, ACS, UTI, COVID  Patient presenting to the ER for evaluation of symptoms as described above.  Based on symptoms, risk factors and considered above differential, this presenting complaint could reflect a potentially life-threatening illness therefore the patient will be placed on continuous pulse oximetry and telemetry for monitoring.  Laboratory evaluation will be sent to evaluate for the above complaints.  Pleuritic reassuring.  EKG nonspecific abnormality lower suspicion for acute ischemia given symptoms but given episode will add on troponin.  Will check COVID.  X-ray of the left ankle my review and interpretation without evidence of significant dislocation.  She is quite tender at the point of possible avulsion fracture will place in boot.  No proximal tenderness palpation.  Abdominal exam is soft and benign.   Clinical Course as of 07/16/23 2132  Wed Jul 16, 2023  2118 Troponin is negative.  No UTI. [PR]  2129 COVID-negative.  Patient feels significantly improved.  Does admit to feeling a little bit dehydrated because she has not had much to eat today but denies any abdominal pain or discomfort.  Patient receiving IV fluids.  She is able to ambulate using a walker which she uses at home.  Denies any numbness or tingling or other concerns.  We discussed option for admission to the hospital observation patient feels comfortable discharge home which given her reassuring workup I think is reasonable. [PR]    Clinical Course User Index [PR] Willy Eddy, MD     FINAL CLINICAL IMPRESSION(S) / ED DIAGNOSES   Final diagnoses:  Dehydration     Rx / DC Orders   ED Discharge Orders     None         Note:  This document was prepared using Dragon voice recognition software and may include unintentional dictation errors.    Willy Eddy, MD 07/16/23 2132

## 2023-07-16 NOTE — ED Triage Notes (Signed)
Pt arrives via [facility] from home. Pt reports waking up this morning with generalized abd pain and feeling like she needed to have BM. Pt reportedly laid in bed all afternoon and upon trying to stand felt dizzy and fell to the ground. Pt denies head injury/LOC, no blood thinners. Pt c/o bilateral ankle pain. EMS reports that pt was initially SBP 86. Had vomiting episode after standing with EMS. Given 250 mL NS bolus and 4 mg zofran enroute.

## 2023-07-22 ENCOUNTER — Telehealth: Payer: Self-pay | Admitting: *Deleted

## 2023-07-22 ENCOUNTER — Ambulatory Visit (INDEPENDENT_AMBULATORY_CARE_PROVIDER_SITE_OTHER): Payer: Medicare Other | Admitting: Internal Medicine

## 2023-07-22 ENCOUNTER — Encounter: Payer: Self-pay | Admitting: Internal Medicine

## 2023-07-22 VITALS — BP 128/64 | HR 101 | Ht 69.0 in | Wt 185.0 lb

## 2023-07-22 DIAGNOSIS — Z7984 Long term (current) use of oral hypoglycemic drugs: Secondary | ICD-10-CM | POA: Diagnosis not present

## 2023-07-22 DIAGNOSIS — I998 Other disorder of circulatory system: Secondary | ICD-10-CM | POA: Insufficient documentation

## 2023-07-22 DIAGNOSIS — S82892A Other fracture of left lower leg, initial encounter for closed fracture: Secondary | ICD-10-CM | POA: Diagnosis not present

## 2023-07-22 DIAGNOSIS — E118 Type 2 diabetes mellitus with unspecified complications: Secondary | ICD-10-CM | POA: Diagnosis not present

## 2023-07-22 DIAGNOSIS — I10 Essential (primary) hypertension: Secondary | ICD-10-CM | POA: Diagnosis not present

## 2023-07-22 LAB — POCT GLYCOSYLATED HEMOGLOBIN (HGB A1C): Hemoglobin A1C: 7.4 % — AB (ref 4.0–5.6)

## 2023-07-22 MED ORDER — TRAMADOL HCL 50 MG PO TABS
50.0000 mg | ORAL_TABLET | Freq: Every evening | ORAL | 0 refills | Status: AC | PRN
Start: 2023-07-22 — End: 2023-08-06

## 2023-07-22 NOTE — Progress Notes (Signed)
Care Coordination   Note   07/22/2023 Name: JLAH BALTHAZAR MRN: [id number] DOB: January 15, 1941  ADILENY ZAVADA is a 82 y.o. year old female who sees Reubin Milan, MD for primary care. I reached out to Erlinda Hong by phone today to offer care coordination services.  Ms. Anania was given information about Care Coordination services today including:   The Care Coordination services include support from the care team which includes your Nurse Coordinator, Clinical Social Worker, or Pharmacist.  The Care Coordination team is here to help remove barriers to the health concerns and goals most important to you. Care Coordination services are voluntary, and the patient may decline or stop services at any time by request to their care team member.   Care Coordination Consent Status: Patient agreed to services and verbal consent obtained.   Follow up plan:  Telephone appointment with care coordination team member scheduled for:  07/28/2023  Encounter Outcome:  Patient Scheduled  Burman Nieves, [facility] Care Coordination Care Guide Direct Dial: [phone]

## 2023-07-22 NOTE — Assessment & Plan Note (Signed)
Normal exam with stable BP on lisinopril and spironolactone. No concerns or side effects to current medication. No change in regimen; continue low sodium diet.

## 2023-07-22 NOTE — Assessment & Plan Note (Addendum)
Blood sugars stable without hypoglycemic symptoms or events. Current regimen is insulin, glucotrol, actos, januvia. Changes made last visit are none. Lab Results  Component Value Date   HGBA1C 7.8 (H) 03/14/2023  A1C today = 7.4 Continue current therapy.

## 2023-07-22 NOTE — Progress Notes (Signed)
Date:  07/22/2023   Name:  Cathy Murphy   DOB:  Sep 11, 1941   MRN:  [id number]   Chief Complaint: Diabetes, Hypertension, and Fall (Had a fall 9/11 fractured left ankle and right ankle sprained, wants medication for ankles for pain /)  Diabetes She presents for her follow-up diabetic visit. She has type 2 diabetes mellitus. Pertinent negatives for hypoglycemia include no dizziness or headaches. Pertinent negatives for diabetes include no chest pain, no fatigue and no weakness.  Hypertension This is a chronic problem. The problem is controlled. Pertinent negatives include no chest pain, headaches, palpitations or shortness of breath. Past treatments include ACE inhibitors.  Ankle Pain  The incident occurred more than 1 week ago. The injury mechanism was a fall. The pain is present in the right ankle and left ankle.  IMPRESSION: Bilateral ankle soft tissue swelling. Small calcaneal spurs. Possible avulsion fracture fragment arising from the distal fibula on the left.  Lab Results  Component Value Date   NA 139 07/16/2023   K 4.0 07/16/2023   CO2 22 07/16/2023   GLUCOSE 148 (H) 07/16/2023   BUN 17 07/16/2023   CREATININE 1.28 (H) 07/16/2023   CALCIUM 9.1 07/16/2023   EGFR 34 (L) 03/14/2023   GFRNONAA 43 (L) 07/16/2023   Lab Results  Component Value Date   CHOL 168 11/13/2022   HDL 60 11/13/2022   LDLCALC 86 11/13/2022   TRIG 122 11/13/2022   CHOLHDL 2.8 11/13/2022   Lab Results  Component Value Date   TSH 2.320 08/29/2021   Lab Results  Component Value Date   HGBA1C 7.4 (A) 07/22/2023   Lab Results  Component Value Date   WBC 10.5 07/16/2023   HGB 13.6 07/16/2023   HCT 44.7 07/16/2023   MCV 96.3 07/16/2023   PLT 228 07/16/2023   Lab Results  Component Value Date   ALT 12 07/16/2023   AST 19 07/16/2023   ALKPHOS 41 07/16/2023   BILITOT 1.5 (H) 07/16/2023   Lab Results  Component Value Date   VD25OH 12.8 (L) 06/15/2015     Review of Systems   Constitutional:  Negative for fatigue and unexpected weight change.  HENT:  Negative for nosebleeds.   Eyes:  Negative for visual disturbance.  Respiratory:  Negative for cough, chest tightness, shortness of breath and wheezing.   Cardiovascular:  Negative for chest pain, palpitations and leg swelling.  Gastrointestinal:  Negative for abdominal pain, constipation and diarrhea.  Musculoskeletal:  Positive for arthralgias and gait problem.  Neurological:  Negative for dizziness, weakness, light-headedness and headaches.    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   Ventral hernia without obstruction or gangrene 03/06/2016   Type II diabetes mellitus with complication (HCC) 11/09/2015   Plantar fasciitis, right 06/16/2015   Knee torn cartilage 04/26/2015   Allergic rhinitis, seasonal 04/26/2015   Essential (primary) hypertension 04/26/2015   Glaucoma 04/26/2015   Hyperlipidemia associated with type 2 diabetes mellitus (HCC) 04/26/2015  Insomnia 04/26/2015   Osteoarthritis of left knee 05/03/2014    Allergies  Allergen Reactions   Farxiga [Dapagliflozin] Itching    Genital yeast infection   Alprazolam Nausea Only   Amoxicillin Diarrhea   Aricept [Donepezil] Other (See Comments)    Leg cramps   Aspirin Nausea And Vomiting   Metformin And Related Diarrhea   Oxycodone Other (See Comments)    CNS side effects/agitation   Riomet  [Metformin Hcl] Nausea And Vomiting   Sulfa Antibiotics    Hydrocodone Rash   Nabumetone Rash   Skelaxin [Metaxalone] Rash    Past Surgical History:  Procedure Laterality Date   ABDOMINAL HYSTERECTOMY     CATARACT EXTRACTION W/PHACO Left 10/01/2021   Procedure: CATARACT EXTRACTION PHACO AND INTRAOCULAR LENS PLACEMENT (IOC) LEFT HYDRUS MICROSTENT DIABETIC;  Surgeon: Nevada Crane, MD;  Location: [facility];  Service: Ophthalmology;  Laterality: Left;  Diabetic 7.49 00:42.1   CATARACT EXTRACTION W/PHACO Right 10/15/2021   Procedure: PHACO EMULSIFICATION  with HYDRUS MICROSTENT iMPLANTATION;  Surgeon: Nevada Crane, MD;  Location: [facility];  Service: Ophthalmology;  Laterality: Right;  Diabetic 4.98 00:27.3   COLONOSCOPY WITH PROPOFOL N/A 11/25/2017   Procedure: COLONOSCOPY WITH PROPOFOL;  Surgeon: Christena Deem, MD;  Location: [facility];  Service: Endoscopy;  Laterality: N/A;   EYE SURGERY     LIPOMA RESECTION     right posterior neck    Social History   Tobacco Use   Smoking status: Former    Current packs/day: 0.00    Average packs/day: 1 pack/day for 7.0 years (7.0 ttl pk-yrs)    Types: Cigarettes    Start date: 47    Quit date: 1965    Years since quitting: 59.7   Smokeless tobacco: Never   Tobacco comments:    smoking cessation materials not required  Vaping Use   Vaping status: Never Used  Substance Use Topics   Alcohol use: No   Drug use: No     Medication list has been reviewed and updated.  Current Meds  Medication Sig   B-D UF III MINI PEN NEEDLES 31G X 5 MM MISC USE AS DIRECTED WITH LEVEMIR   bimatoprost (LUMIGAN) 0.01 % SOLN Administer 1 drop into the left eye two (2) times a day.   cholecalciferol (VITAMIN D) 1000 units tablet Take 2,000 Units by mouth daily.   Cholecalciferol 25 MCG (1000 UT) tablet Take by mouth.   ezetimibe (ZETIA) 10 MG tablet Take 10 mg by mouth daily.   glipiZIDE (GLUCOTROL) 5 MG tablet  Take 1 tablet (5 mg total) by mouth 2 (two) times daily.   glucose blood (ONETOUCH ULTRA) test strip USE TO TEST BLOOD SUGAR UP TO TWICE DAILY.   hydrOXYzine (ATARAX) 25 MG tablet TAKE 0.5-1 TABLETS (12.5-25 MG TOTAL) BY MOUTH EVERY 8 (EIGHT) HOURS AS NEEDED FOR ANXIETY.   insulin glargine (LANTUS SOLOSTAR) 100 UNIT/ML Solostar Pen Inject 30 Units into the skin every morning only - no evening dose   lisinopril (ZESTRIL) 40 MG tablet Take 1 tablet (40 mg total) by mouth daily.   pioglitazone (ACTOS) 15 MG tablet TAKE 1 TABLET BY MOUTH EVERY DAY   rosuvastatin (CRESTOR) 40 MG tablet TAKE 1 TABLET BY MOUTH EVERY DAY   sitaGLIPtin (JANUVIA) 100 MG tablet TAKE 1 TABLET BY MOUTH EVERY DAY   spironolactone (ALDACTONE) 25 MG tablet Take 1 tablet (25 mg total) by mouth daily.   traMADol (ULTRAM) 50 MG tablet Take 1 tablet (50 mg  total) by mouth at bedtime as needed for up to 15 days.   traZODone (DESYREL) 50 MG tablet TAKE 1 TABLET BY MOUTH EVERYDAY AT BEDTIME       07/22/2023   11:02 AM 03/14/2023   10:10 AM 11/13/2022    9:55 AM 07/11/2022   10:45 AM  GAD 7 : Generalized Anxiety Score  Nervous, Anxious, on Edge 2 1 0 0  Control/stop worrying 2 1 0 0  Worry too much - different things 2 1 0 0  Trouble relaxing 3 1 0 0  Restless 3 1 0 0  Easily annoyed or irritable 2 0 0 0  Afraid - awful might happen 2 0 0 0  Total GAD 7 Score 16 5 0 0  Anxiety Difficulty Not difficult at all Not difficult at all Not difficult at all Not difficult at all       07/22/2023   11:02 AM 03/14/2023   10:10 AM 11/13/2022    9:55 AM  Depression screen PHQ 2/9  Decreased Interest 1 1 1   Down, Depressed, Hopeless 0 1 0  PHQ - 2 Score 1 2 1   Altered sleeping 2 2 0  Tired, decreased energy 2 2 1   Change in appetite 2 0 0  Feeling bad or failure about yourself  2 0 0  Trouble concentrating 2 1 1   Moving slowly or fidgety/restless 0 2 0  Suicidal thoughts 0 0 0  PHQ-9 Score 11 9 3   Difficult doing work/chores Not  difficult at all Somewhat difficult Not difficult at all    BP Readings from Last 3 Encounters:  07/22/23 128/64  07/16/23 135/71  03/14/23 126/64    Physical Exam Vitals and nursing note reviewed.  Constitutional:      General: She is not in acute distress.    Appearance: She is well-developed.  HENT:     Head: Normocephalic and atraumatic.  Neck:     Vascular: No carotid bruit.  Cardiovascular:     Rate and Rhythm: Normal rate and regular rhythm.  Pulmonary:     Effort: Pulmonary effort is normal. No respiratory distress.     Breath sounds: No wheezing or rhonchi.  Musculoskeletal:     Cervical back: Normal range of motion.     Comments: Surgical boot on left ankle/foot ACO on right ankle Xrays reviewed  Lymphadenopathy:     Cervical: No cervical adenopathy.  Skin:    General: Skin is warm and dry.     Findings: No rash.  Neurological:     Mental Status: She is alert and oriented to person, place, and time.  Psychiatric:        Mood and Affect: Mood normal.        Behavior: Behavior normal.     Wt Readings from Last 3 Encounters:  07/22/23 185 lb (83.9 kg)  07/16/23 185 lb (83.9 kg)  03/14/23 185 lb (83.9 kg)    BP 128/64   Pulse (!) 101   Ht 5\' 9"  (1.753 m)   Wt 185 lb (83.9 kg)   SpO2 95%   BMI 27.32 kg/m   Assessment and Plan:  Problem List Items Addressed This Visit       Unprioritized   Vascular calcification    Seen on ankle xray Patient states symptoms of claudication for some Current ankle fracture needs addressing then will refer to VS      Type II diabetes mellitus with complication (HCC) - Primary (Chronic)    Blood  sugars stable without hypoglycemic symptoms or events. Current regimen is insulin, glucotrol, actos, januvia. Changes made last visit are none. Lab Results  Component Value Date   HGBA1C 7.8 (H) 03/14/2023  A1C today = 7.4 Continue current therapy.       Relevant Orders   POCT glycosylated hemoglobin (Hb A1C)  (Completed)   Essential (primary) hypertension (Chronic)    Normal exam with stable BP on lisinopril and spironolactone. No concerns or side effects to current medication. No change in regimen; continue low sodium diet.       Other Visit Diagnoses     Closed fracture of left ankle, initial encounter       continue boot and ASO elevate; heat to right ankle - may use ACE wrap instead Tramadol at HS to aid sleep   Relevant Medications   traMADol (ULTRAM) 50 MG tablet       Return in about 4 months (around 11/21/2023) for CPX, DM.    Reubin Milan, MD [facility] [place]

## 2023-07-22 NOTE — Assessment & Plan Note (Signed)
Seen on ankle xray Patient states symptoms of claudication for some Current ankle fracture needs addressing then will refer to VS

## 2023-07-23 ENCOUNTER — Ambulatory Visit (INDEPENDENT_AMBULATORY_CARE_PROVIDER_SITE_OTHER): Payer: Medicare Other

## 2023-07-23 DIAGNOSIS — Z0000 Encounter for general adult medical examination without abnormal findings: Secondary | ICD-10-CM | POA: Diagnosis not present

## 2023-07-23 NOTE — Patient Instructions (Addendum)
Cathy Murphy , Thank you for taking time to come for your Medicare Wellness Visit. I appreciate your ongoing commitment to your health goals. Please review the following plan we discussed and let me know if I can assist you in the future.   Referrals/Orders/Follow-Ups/Clinician Recommendations: none  This is a list of the screening recommended for you and due dates:  Health Maintenance  Topic Date Due   Zoster (Shingles) Vaccine (1 of 2) Never done   Pneumonia Vaccine (1 of 1 - PCV) Never done   DTaP/Tdap/Td vaccine (2 - Tdap) 03/04/2021   Eye exam for diabetics  05/02/2023   COVID-19 Vaccine (4 - 2023-24 season) 07/06/2023   Flu Shot  02/02/2024*   Mammogram  10/15/2023   Hemoglobin A1C  01/19/2024   Yearly kidney health urinalysis for diabetes  03/13/2024   Complete foot exam   03/13/2024   Yearly kidney function blood test for diabetes  07/15/2024   Medicare Annual Wellness Visit  07/22/2024   DEXA scan (bone density measurement)  Completed   Hepatitis C Screening  Completed   HPV Vaccine  Aged Out   Colon Cancer Screening  Discontinued  *Topic was postponed. The date shown is not the original due date.    Advanced directives: (ACP Link)Information on Advanced Care Planning can be found at [facility] of [facility] Directives Advance Health Care Directives ([email or web address]   Next Medicare Annual Wellness Visit scheduled for next year: Yes    07/28/24 @ 9:45 am by video

## 2023-07-23 NOTE — Progress Notes (Signed)
Subjective:   Cathy Murphy is a 82 y.o. female who presents for Medicare Annual (Subsequent) preventive examination.  Visit Complete: Virtual  I connected with  Erlinda Hong on 07/23/23 by a video and audio enabled telemedicine application and verified that I am speaking with the correct person using two identifiers.  Patient Location: Home  Provider Location: Office/Clinic  I discussed the limitations of evaluation and management by telemedicine. The patient expressed understanding and agreed to proceed.  Vital Signs: Unable to obtain new vitals due to this being a telehealth visit.  Cardiac Risk Factors include: advanced age (>14men, >66 women);diabetes mellitus;dyslipidemia;hypertension     Objective:    Today's Vitals   07/23/23 1119  PainSc: 7    There is no height or weight on file to calculate BMI.     07/23/2023   11:31 AM 07/16/2023    5:24 PM 11/06/2022   11:01 AM 07/17/2022   10:03 AM 01/14/2022    9:41 AM 10/19/2021    1:27 PM 10/15/2021    9:02 AM  Advanced Directives  Does Patient Have a Medical Advance Directive? No No No No No No Yes  Type of Tax inspector;Living will  Does patient want to make changes to medical advance directive?       No - Patient declined  Copy of Healthcare Power of Attorney in Chart?       No - copy requested  Would patient like information on creating a medical advance directive? No - Patient declined  No - Patient declined No - Patient declined No - Patient declined      Current Medications (verified) Outpatient Encounter Medications as of 07/23/2023  Medication Sig   B-D UF III MINI PEN NEEDLES 31G X 5 MM MISC USE AS DIRECTED WITH LEVEMIR   bimatoprost (LUMIGAN) 0.01 % SOLN Administer 1 drop into the left eye two (2) times a day.   cholecalciferol (VITAMIN D) 1000 units tablet Take 2,000 Units by mouth daily.   Cholecalciferol 25 MCG (1000 UT) tablet Take by mouth.   ezetimibe (ZETIA) 10  MG tablet Take 10 mg by mouth daily.   glipiZIDE (GLUCOTROL) 5 MG tablet Take 1 tablet (5 mg total) by mouth 2 (two) times daily.   glucose blood (ONETOUCH ULTRA) test strip USE TO TEST BLOOD SUGAR UP TO TWICE DAILY.   hydrOXYzine (ATARAX) 25 MG tablet TAKE 0.5-1 TABLETS (12.5-25 MG TOTAL) BY MOUTH EVERY 8 (EIGHT) HOURS AS NEEDED FOR ANXIETY.   insulin glargine (LANTUS SOLOSTAR) 100 UNIT/ML Solostar Pen Inject 30 Units into the skin every morning only - no evening dose   lisinopril (ZESTRIL) 40 MG tablet Take 1 tablet (40 mg total) by mouth daily.   pioglitazone (ACTOS) 15 MG tablet TAKE 1 TABLET BY MOUTH EVERY DAY   rosuvastatin (CRESTOR) 40 MG tablet TAKE 1 TABLET BY MOUTH EVERY DAY   sitaGLIPtin (JANUVIA) 100 MG tablet TAKE 1 TABLET BY MOUTH EVERY DAY   spironolactone (ALDACTONE) 25 MG tablet Take 1 tablet (25 mg total) by mouth daily.   traMADol (ULTRAM) 50 MG tablet Take 1 tablet (50 mg total) by mouth at bedtime as needed for up to 15 days.   traZODone (DESYREL) 50 MG tablet TAKE 1 TABLET BY MOUTH EVERYDAY AT BEDTIME   [DISCONTINUED] albuterol (PROAIR HFA) 108 (90 Base) MCG/ACT inhaler Inhale 2 puffs into the lungs every 6 (six) hours as needed for wheezing or shortness of  breath.   No facility-administered encounter medications on file as of 07/23/2023.    Allergies (verified) Farxiga [dapagliflozin], Alprazolam, Amoxicillin, Aricept [donepezil], Aspirin, Metformin and related, Oxycodone, Riomet [metformin hcl], Sulfa antibiotics, Hydrocodone, Nabumetone, and Skelaxin [metaxalone]   History: Past Medical History:  Diagnosis Date   Allergy    Anxiety    COVID-19 09/08/2021   Fever, chills, sneezing, runny nose.  Resolved.   Diabetes mellitus without complication (HCC)    Glaucoma    Hyperlipidemia    Hypertension    Past Surgical History:  Procedure Laterality Date   ABDOMINAL HYSTERECTOMY     CATARACT EXTRACTION W/PHACO Left 10/01/2021   Procedure: CATARACT EXTRACTION PHACO  AND INTRAOCULAR LENS PLACEMENT (IOC) LEFT HYDRUS MICROSTENT DIABETIC;  Surgeon: Nevada Crane, MD;  Location: [facility];  Service: Ophthalmology;  Laterality: Left;  Diabetic 7.49 00:42.1   CATARACT EXTRACTION W/PHACO Right 10/15/2021   Procedure: PHACO EMULSIFICATION  with HYDRUS MICROSTENT iMPLANTATION;  Surgeon: Nevada Crane, MD;  Location: [facility];  Service: Ophthalmology;  Laterality: Right;  Diabetic 4.98 00:27.3   COLONOSCOPY WITH PROPOFOL N/A 11/25/2017   Procedure: COLONOSCOPY WITH PROPOFOL;  Surgeon: Christena Deem, MD;  Location: [facility];  Service: Endoscopy;  Laterality: N/A;   EYE SURGERY     LIPOMA RESECTION     right posterior neck   Family History  Problem Relation Age of Onset   Cancer Mother        Breast   Breast cancer Mother    Heart disease Father    Heart attack Brother    Cancer Maternal Aunt        breast   Breast cancer Maternal Aunt    Breast cancer Cousin        mat cousin   Social History   Socioeconomic History   Marital status: Widowed    Spouse name: Not on file   Number of children: 1   Years of education: Not on file   Highest education level: 12th grade  Occupational History    Employer: [employer]  Tobacco Use   Smoking status: Former    Current packs/day: 0.00    Average packs/day: 1 pack/day for 7.0 years (7.0 ttl pk-yrs)    Types: Cigarettes    Start date: 5    Quit date: 1965    Years since quitting: 59.7   Smokeless tobacco: Never   Tobacco comments:    smoking cessation materials not required  Vaping Use   Vaping status: Never Used  Substance and Sexual Activity   Alcohol use: No   Drug use: No   Sexual activity: Never  Other Topics Concern   Not on file  Social History Narrative   Pt lives alone.   Social Determinants of Health   Financial Resource Strain: Low Risk  (07/23/2023)   Overall Financial Resource Strain (CARDIA)    Difficulty of Paying Living Expenses: Not hard  at all  Food Insecurity: No Food Insecurity (07/23/2023)   Hunger Vital Sign    Worried About Running Out of Food in the Last Year: Never true    Ran Out of Food in the Last Year: Never true  Transportation Needs: No Transportation Needs (07/23/2023)   PRAPARE - Administrator, Civil Service (Medical): No    Lack of Transportation (Non-Medical): No  Physical Activity: Insufficiently Active (07/23/2023)   Exercise Vital Sign    Days of Exercise per Week: 3 days    Minutes of Exercise  per Session: 30 min  Stress: No Stress Concern Present (07/23/2023)   [employer] of Occupational Health - Occupational Stress Questionnaire    Feeling of Stress : Only a little  Social Connections: Moderately Isolated (07/23/2023)   Social Connection and Isolation Panel [NHANES]    Frequency of Communication with Friends and Family: More than three times a week    Frequency of Social Gatherings with Friends and Family: Never    Attends Religious Services: More than 4 times per year    Active Member of [employer] or Organizations: No    Attends Banker Meetings: Never    Marital Status: Widowed    Tobacco Counseling Counseling given: Not Answered Tobacco comments: smoking cessation materials not required   Clinical Intake:  Pre-visit preparation completed: Yes  Pain : 0-10 Pain Score: 7  Pain Type: Acute pain Pain Location: Ankle Pain Orientation: Right, Left Pain Descriptors / Indicators: Aching, Constant, Tender, Throbbing Pain Onset: In the past 7 days Pain Frequency: Constant Pain Relieving Factors: taking Tylenol, pain pills Effect of Pain on Daily Activities: injury from a fall  Pain Relieving Factors: taking Tylenol, pain pills  Nutritional Status: BMI 25 -29 Overweight Nutritional Risks: None Diabetes: Yes CBG done?: No Did pt. bring in CBG monitor from home?: No  How often do you need to have someone help you when you read instructions, pamphlets, or other  written materials from your doctor or pharmacy?: 1 - Never  Interpreter Needed?: No  Information entered by :: Kennedy Bucker, LPN   Activities of Daily Living    07/23/2023   11:32 AM  In your present state of health, do you have any difficulty performing the following activities:  Hearing? 0  Vision? 0  Difficulty concentrating or making decisions? 0  Walking or climbing stairs? 0  Dressing or bathing? 0  Doing errands, shopping? 0  Preparing Food and eating ? N  Using the Toilet? N  In the past six months, have you accidently leaked urine? N  Do you have problems with loss of bowel control? N  Managing your Medications? N  Managing your Finances? N  Housekeeping or managing your Housekeeping? N    Patient Care Team: Reubin Milan, MD as PCP - General (Internal Medicine) [facility] (Ophthalmology) Lamar Blinks, MD as Consulting Physician (Cardiology) Elijah Birk, MD as Referring Physician (Physical Medicine and Rehabilitation) Dedra Skeens, [place] (Orthopedic Surgery)  Indicate any recent Medical Services you may have received from other than [facility] providers in the past year (date may be approximate).     Assessment:   This is a routine wellness examination for [place].  Hearing/Vision screen Hearing Screening - Comments:: No aids Vision Screening - Comments:: Wears glasses- [facility]   Goals Addressed             This Visit's Progress    DIET - EAT MORE FRUITS AND VEGETABLES         Depression Screen    07/23/2023   11:30 AM 07/22/2023   11:02 AM 03/14/2023   10:10 AM 11/13/2022    9:55 AM 07/17/2022    9:58 AM 07/11/2022   10:44 AM 03/07/2022   10:22 AM  PHQ 2/9 Scores  PHQ - 2 Score 0 1 2 1 1  0 1  PHQ- 9 Score 0 11 9 3 4  0 3    Fall Risk    07/23/2023   11:32 AM 07/22/2023   11:02 AM 03/14/2023  10:10 AM 11/13/2022    9:55 AM 07/17/2022   10:04 AM  Fall Risk   Falls in the past year? 1 1 0 0 0  Number falls in past yr: 1 0 1 0  0  Injury with Fall? 1 1 0 0 0  Risk for fall due to : History of fall(s) History of fall(s) No Fall Risks No Fall Risks No Fall Risks  Follow up Falls prevention discussed;Falls evaluation completed Falls evaluation completed Falls evaluation completed Falls evaluation completed Falls evaluation completed    MEDICARE RISK AT HOME: Medicare Risk at Home Any stairs in or around the home?: Yes If so, are there any without handrails?: No Home free of loose throw rugs in walkways, pet beds, electrical cords, etc?: Yes Adequate lighting in your home to reduce risk of falls?: Yes Life alert?: No Use of a cane, walker or w/c?: Yes (cane occasionally- using walker now d/t ankles) Grab bars in the bathroom?: No Shower chair or bench in shower?: Yes Elevated toilet seat or a handicapped toilet?: Yes  TIMED UP AND GO:  Was the test performed?  No    Cognitive Function:        07/23/2023   11:34 AM 07/17/2022   10:04 AM 12/11/2020    2:10 PM 07/12/2020   10:19 AM 07/07/2019   10:28 AM  6CIT Screen  What Year? 0 points 0 points 0 points 0 points 0 points  What month? 0 points 0 points 0 points 0 points 0 points  What time? 0 points 0 points 0 points 0 points 0 points  Count back from 20 0 points 0 points 4 points 0 points 0 points  Months in reverse 4 points 4 points 2 points 0 points 0 points  Repeat phrase 4 points 4 points 4 points 0 points 0 points  Total Score 8 points 8 points 10 points 0 points 0 points    Immunizations Immunization History  Administered Date(s) Administered   Influenza-Unspecified 03/06/2016   Moderna Sars-Covid-2 Vaccination 01/01/2020, 01/29/2020, 10/31/2020   Td 03/05/2011    TDAP status: Due, Education has been provided regarding the importance of this vaccine. Advised may receive this vaccine at local pharmacy or Health Dept. Aware to provide a copy of the vaccination record if obtained from local pharmacy or Health Dept. Verbalized acceptance and  understanding.  Flu Vaccine status: Declined, Education has been provided regarding the importance of this vaccine but patient still declined. Advised may receive this vaccine at local pharmacy or Health Dept. Aware to provide a copy of the vaccination record if obtained from local pharmacy or Health Dept. Verbalized acceptance and understanding.  Pneumococcal vaccine status: Declined,  Education has been provided regarding the importance of this vaccine but patient still declined. Advised may receive this vaccine at local pharmacy or Health Dept. Aware to provide a copy of the vaccination record if obtained from local pharmacy or Health Dept. Verbalized acceptance and understanding.   Covid-19 vaccine status: Completed vaccines  Qualifies for Shingles Vaccine? Yes   Zostavax completed No   Shingrix Completed?: No.    Education has been provided regarding the importance of this vaccine. Patient has been advised to call insurance company to determine out of pocket expense if they have not yet received this vaccine. Advised may also receive vaccine at local pharmacy or Health Dept. Verbalized acceptance and understanding.  Screening Tests Health Maintenance  Topic Date Due   Zoster Vaccines- Shingrix (1 of 2) Never done  Pneumonia Vaccine 3+ Years old (1 of 1 - PCV) Never done   DTaP/Tdap/Td (2 - Tdap) 03/04/2021   OPHTHALMOLOGY EXAM  05/02/2023   COVID-19 Vaccine (4 - 2023-24 season) 07/06/2023   INFLUENZA VACCINE  02/02/2024 (Originally 06/05/2023)   MAMMOGRAM  10/15/2023   HEMOGLOBIN A1C  01/19/2024   Diabetic kidney evaluation - Urine ACR  03/13/2024   FOOT EXAM  03/13/2024   Diabetic kidney evaluation - eGFR measurement  07/15/2024   Medicare Annual Wellness (AWV)  07/22/2024   DEXA SCAN  Completed   Hepatitis C Screening  Completed   HPV VACCINES  Aged Out   Colonoscopy  Discontinued    Health Maintenance  Health Maintenance Due  Topic Date Due   Zoster Vaccines- Shingrix (1  of 2) Never done   Pneumonia Vaccine 43+ Years old (1 of 1 - PCV) Never done   DTaP/Tdap/Td (2 - Tdap) 03/04/2021   OPHTHALMOLOGY EXAM  05/02/2023   COVID-19 Vaccine (4 - 2023-24 season) 07/06/2023    Colorectal cancer screening: No longer required.   Mammogram status: No longer required due to age.  Bone Density status: Completed 08/04/19. Results reflect: Bone density results: OSTEOPENIA. Repeat every 5 years.  Lung Cancer Screening: (Low Dose CT Chest recommended if Age 92-80 years, 20 pack-year currently smoking OR have quit w/in 15years.) does not qualify.     Additional Screening:  Hepatitis C Screening: does qualify; Completed 05/16/20  Vision Screening: Recommended annual ophthalmology exams for early detection of glaucoma and other disorders of the eye. Is the patient up to date with their annual eye exam?  Yes  Who is the provider or what is the name of the office in which the patient attends annual eye exams? Dr[facility]Willett at [facility] If pt is not established with a provider, would they like to be referred to a provider to establish care? No .   Dental Screening: Recommended annual dental exams for proper oral hygiene  Diabetic Foot Exam: Diabetic Foot Exam: Completed 03/14/23  Community Resource Referral / Chronic Care Management: CRR required this visit?  No   CCM required this visit?  No     Plan:     I have personally reviewed and noted the following in the patient's chart:   Medical and social history Use of alcohol, tobacco or illicit drugs  Current medications and supplements including opioid prescriptions. Patient is not currently taking opioid prescriptions. Functional ability and status Nutritional status Physical activity Advanced directives List of other physicians Hospitalizations, surgeries, and ER visits in previous 12 months Vitals Screenings to include cognitive, depression, and falls Referrals and appointments  In addition, I have reviewed and  discussed with patient certain preventive protocols, quality metrics, and best practice recommendations. A written personalized care plan for preventive services as well as general preventive health recommendations were provided to patient.     Hal Hope, LPN   7/82/9562   After Visit Summary: (MyChart) Due to this being a telephonic visit, the after visit summary with patients personalized plan was offered to patient via MyChart   Nurse Notes: none

## 2023-07-28 ENCOUNTER — Ambulatory Visit: Payer: Self-pay | Admitting: *Deleted

## 2023-07-28 NOTE — Patient Instructions (Signed)
Visit Information  Thank you for taking time to visit with me today. Please don't hesitate to contact me if I can be of assistance to you.   Following are the goals we discussed today:  Please follow up with your podiatrist(Dr. Ether Griffins) on 07/29/23 Please review DME recommendations with Dr. Ether Griffins as well Please contact your primary care provider with any additional questions regarding your medical care   Our next appointment is by telephone on 07/30/23 at 11:30am  Please call the care guide team at [phone] if you need to cancel or reschedule your appointment.   If you are experiencing a Mental Health or Behavioral Health Crisis or need someone to talk to, please call 911   Patient verbalizes understanding of instructions and care plan provided today and agrees to view in MyChart. Active MyChart status and patient understanding of how to access instructions and care plan via MyChart confirmed with patient.     Telephone follow up appointment with care management team member scheduled for: 07/30/23  Cathy Czech, LCSW [place]  [facility], [facility] Licensed Clinical Social Worker Care Coordinator  Direct Dial: [phone]

## 2023-07-28 NOTE — Patient Outreach (Addendum)
Care Coordination   Initial Visit Note   07/28/2023 Name: Cathy Murphy MRN: [id number] DOB: 07-08-1941  Cathy Murphy is a 82 y.o. year old female who sees Reubin Milan, MD for primary care. I spoke with  Erlinda Hong by phone today.  What matters to the patients health and wellness today?  Patient reports that she has fractured her right ankle and sprained her left ankle following a fall. Patient confirms that she is staying with her daughter who is helping with her care. Patient requesting a hospital bed as she is not able to navigate the stairs to the bedroom. She is currently sleeping in a recliner or the couch which per patient has been difficult. Patient to follow up with podiatry on 07/29/23 and will discuss DME needs at that time,    Goals Addressed             This Visit's Progress    Community resource needs       Interventions Today    Flowsheet Row Most Recent Value  Chronic Disease   Chronic disease during today's visit Other  [fall, sprained right ankle, fractured left ankle-brace on rt-boot on left]  General Interventions   General Interventions Discussed/Reviewed General Interventions Discussed, Durable Medical Equipment (DME), Doctor Visits  Doctor Visits Discussed/Reviewed Doctor Visits Discussed  [Podiatry 07/29/23]  Durable Medical Equipment (DME) Other  [patient requesting hospital bed, due to inability to maneuver stairs in daughter's home-currently sleeping in recliner or couch]  Mental Health Interventions   Mental Health Discussed/Reviewed Mental Health Discussed, Coping Strategies, Depression  [confirmed trigger due to recent fall and inactivity-encouraged verbalization of feelings related to injury, emotional support/reflective listening provided, positive reinforcement provided for postive coping strategies utilized to manage symptoms]  Safety Interventions   Safety Discussed/Reviewed Safety Discussed  [patient confirms using a walker when ambulating]               SDOH assessments and interventions completed:  Yes  SDOH Interventions Today    Flowsheet Row Most Recent Value  SDOH Interventions   Food Insecurity Interventions Intervention Not Indicated  Housing Interventions Intervention Not Indicated  Transportation Interventions Intervention Not Indicated  Utilities Interventions Intervention Not Indicated        Care Coordination Interventions:  Yes, provided   Follow up plan: Follow up call scheduled for 07/30/23    Encounter Outcome:  Patient Visit Completed

## 2023-07-29 DIAGNOSIS — E1169 Type 2 diabetes mellitus with other specified complication: Secondary | ICD-10-CM | POA: Diagnosis not present

## 2023-07-29 DIAGNOSIS — S93492A Sprain of other ligament of left ankle, initial encounter: Secondary | ICD-10-CM | POA: Diagnosis not present

## 2023-07-29 DIAGNOSIS — S82391A Other fracture of lower end of right tibia, initial encounter for closed fracture: Secondary | ICD-10-CM | POA: Diagnosis not present

## 2023-07-29 DIAGNOSIS — E785 Hyperlipidemia, unspecified: Secondary | ICD-10-CM | POA: Diagnosis not present

## 2023-07-29 DIAGNOSIS — M25571 Pain in right ankle and joints of right foot: Secondary | ICD-10-CM | POA: Diagnosis not present

## 2023-07-30 ENCOUNTER — Ambulatory Visit: Payer: Self-pay | Admitting: *Deleted

## 2023-07-30 NOTE — Patient Outreach (Signed)
Care Coordination   Follow Up Visit Note   07/30/2023 Name: GWYNN HEIN MRN: [id number] DOB: December 29, 1940  IZARA GRAHOVAC is a 82 y.o. year old female who sees Reubin Milan, MD for primary care. I spoke with  Erlinda Hong by phone today.  What matters to the patients health and wellness today?  Patient confirms improvement in mood with confirmation that surgery is no longer needed. Patient remains in  daughter's home to assist with her care needs while healing. Patient confirms no additional community resource needs.     Goals Addressed             This Visit's Progress    Community resource needs       Interventions Today    Flowsheet Row Most Recent Value  Chronic Disease   Chronic disease during today's visit Other  [sprained right ankle and fracture in left ankle]  General Interventions   General Interventions Discussed/Reviewed General Interventions Reviewed, Durable Medical Equipment (DME)  [appt w/ podiatrist completed on 9/24.Per patient  no surgery needed at this time ankle injury will heal on their own. Patient has no DME needs.]  Doctor Visits Discussed/Reviewed Doctor Visits Reviewed, PCP  [facility] 12/25/23 AWV 07/29/23]  Mental Health Interventions   Mental Health Discussed/Reviewed Mental Health Reviewed  [patient confirms improvement in depressive symptoms-emotional support continues to be provided]              SDOH assessments and interventions completed:  No     Care Coordination Interventions:  Yes, provided   Follow up plan: No further intervention required.   Encounter Outcome:  Patient Visit Completed

## 2023-07-30 NOTE — Patient Instructions (Signed)
Visit Information  Thank you for taking time to visit with me today. Please don't hesitate to contact me if I can be of assistance to you.   Following are the goals we discussed today:  Please contact this social worker with any additional community resource or mental health needs.  If you are experiencing a Mental Health or Behavioral Health Crisis or need someone to talk to, please call 911   Patient verbalizes understanding of instructions and care plan provided today and agrees to view in MyChart. Active MyChart status and patient understanding of how to access instructions and care plan via MyChart confirmed with patient.     No further follow up required  [employer], LCSW [place]  [facility], [facility] Licensed Clinical Social Worker Care Coordinator  Direct Dial: [phone]

## 2023-08-14 DIAGNOSIS — E119 Type 2 diabetes mellitus without complications: Secondary | ICD-10-CM | POA: Diagnosis not present

## 2023-08-14 DIAGNOSIS — S82391D Other fracture of lower end of right tibia, subsequent encounter for closed fracture with routine healing: Secondary | ICD-10-CM | POA: Diagnosis not present

## 2023-08-14 DIAGNOSIS — S93492D Sprain of other ligament of left ankle, subsequent encounter: Secondary | ICD-10-CM | POA: Diagnosis not present

## 2023-09-03 DIAGNOSIS — E113392 Type 2 diabetes mellitus with moderate nonproliferative diabetic retinopathy without macular edema, left eye: Secondary | ICD-10-CM | POA: Diagnosis not present

## 2023-09-03 DIAGNOSIS — H40043 Steroid responder, bilateral: Secondary | ICD-10-CM | POA: Diagnosis not present

## 2023-09-03 DIAGNOSIS — H35351 Cystoid macular degeneration, right eye: Secondary | ICD-10-CM | POA: Diagnosis not present

## 2023-09-04 DIAGNOSIS — S82391D Other fracture of lower end of right tibia, subsequent encounter for closed fracture with routine healing: Secondary | ICD-10-CM | POA: Diagnosis not present

## 2023-09-04 DIAGNOSIS — E119 Type 2 diabetes mellitus without complications: Secondary | ICD-10-CM | POA: Diagnosis not present

## 2023-09-25 DIAGNOSIS — M1712 Unilateral primary osteoarthritis, left knee: Secondary | ICD-10-CM | POA: Diagnosis not present

## 2023-10-04 ENCOUNTER — Other Ambulatory Visit: Payer: Self-pay | Admitting: Internal Medicine

## 2023-10-04 DIAGNOSIS — E118 Type 2 diabetes mellitus with unspecified complications: Secondary | ICD-10-CM

## 2023-10-07 NOTE — Telephone Encounter (Signed)
Requested Prescriptions  Pending Prescriptions Disp Refills   glipiZIDE (GLUCOTROL) 5 MG tablet [Pharmacy Med Name: GLIPIZIDE 5 MG TABLET] 180 tablet 1    Sig: TAKE 1 TABLET BY MOUTH TWICE A DAY     Endocrinology:  Diabetes - Sulfonylureas Failed - 10/04/2023  1:02 AM      Failed - Cr in normal range and within 360 days    Creatinine  Date Value Ref Range Status  07/22/2012 0.96 0.60 - 1.30 mg/dL Final   Creatinine, Ser  Date Value Ref Range Status  07/16/2023 1.28 (H) 0.44 - 1.00 mg/dL Final         Passed - HBA1C is between 0 and 7.9 and within 180 days    Hemoglobin A1C  Date Value Ref Range Status  07/22/2023 7.4 (A) 4.0 - 5.6 % Final  06/25/2022 6.4  Final   Hgb A1c MFr Bld  Date Value Ref Range Status  03/14/2023 7.8 (H) 4.8 - 5.6 % Final    Comment:             Prediabetes: 5.7 - 6.4          Diabetes: >6.4          Glycemic control for adults with diabetes: <7.0          Passed - Valid encounter within last 6 months    Recent Outpatient Visits           2 months ago Type II diabetes mellitus with complication [facility])   [place] [facility] at [facility], Nyoka Cowden, MD   6 months ago Type II diabetes mellitus with complication [facility])   [place] [facility] at [facility], Nyoka Cowden, MD   10 months ago Type II diabetes mellitus with complication [facility])   [place] [facility] at [facility], Nyoka Cowden, MD   1 year ago Primary osteoarthritis of right knee   [facility] at [facility], Nyoka Cowden, MD   1 year ago Essential (primary) hypertension   [place] [facility] at [facility], Nyoka Cowden, MD       Future Appointments             In 2 months Judithann Graves, Nyoka Cowden, MD [facility] at [facility], [facility]

## 2023-10-15 DIAGNOSIS — H35351 Cystoid macular degeneration, right eye: Secondary | ICD-10-CM | POA: Diagnosis not present

## 2023-10-15 DIAGNOSIS — H40043 Steroid responder, bilateral: Secondary | ICD-10-CM | POA: Diagnosis not present

## 2023-10-15 DIAGNOSIS — H2000 Unspecified acute and subacute iridocyclitis: Secondary | ICD-10-CM | POA: Diagnosis not present

## 2023-10-15 DIAGNOSIS — E113392 Type 2 diabetes mellitus with moderate nonproliferative diabetic retinopathy without macular edema, left eye: Secondary | ICD-10-CM | POA: Diagnosis not present

## 2023-10-17 ENCOUNTER — Other Ambulatory Visit: Payer: Self-pay | Admitting: Internal Medicine

## 2023-10-17 DIAGNOSIS — E1169 Type 2 diabetes mellitus with other specified complication: Secondary | ICD-10-CM

## 2023-10-20 DIAGNOSIS — R0609 Other forms of dyspnea: Secondary | ICD-10-CM | POA: Diagnosis not present

## 2023-10-24 ENCOUNTER — Other Ambulatory Visit: Payer: Self-pay | Admitting: Internal Medicine

## 2023-10-27 DIAGNOSIS — I10 Essential (primary) hypertension: Secondary | ICD-10-CM | POA: Diagnosis not present

## 2023-10-27 DIAGNOSIS — R296 Repeated falls: Secondary | ICD-10-CM | POA: Diagnosis not present

## 2023-10-27 DIAGNOSIS — Z87898 Personal history of other specified conditions: Secondary | ICD-10-CM | POA: Diagnosis not present

## 2023-10-27 DIAGNOSIS — I700 Atherosclerosis of aorta: Secondary | ICD-10-CM | POA: Diagnosis not present

## 2023-10-27 DIAGNOSIS — E785 Hyperlipidemia, unspecified: Secondary | ICD-10-CM | POA: Diagnosis not present

## 2023-10-27 DIAGNOSIS — I2510 Atherosclerotic heart disease of native coronary artery without angina pectoris: Secondary | ICD-10-CM | POA: Diagnosis not present

## 2023-10-27 DIAGNOSIS — E1169 Type 2 diabetes mellitus with other specified complication: Secondary | ICD-10-CM | POA: Diagnosis not present

## 2023-10-27 NOTE — Telephone Encounter (Signed)
Requested Prescriptions  Pending Prescriptions Disp Refills   B-D UF III MINI PEN NEEDLES 31G X 5 MM MISC [Pharmacy Med Name: BD UF MINI PEN NEEDLE 5MMX31G] 100 each 0    Sig: USE AS DIRECTED WITH LEVEMIR     Endocrinology: Diabetes - Testing Supplies Passed - 10/27/2023 10:28 AM      Passed - Valid encounter within last 12 months    Recent Outpatient Visits           3 months ago Type II diabetes mellitus with complication [facility])   [place] [facility] at [facility], Nyoka Cowden, MD   7 months ago Type II diabetes mellitus with complication [facility])   [place] [facility] at [facility], Nyoka Cowden, MD   11 months ago Type II diabetes mellitus with complication [facility])   [place] [facility] at [facility], Nyoka Cowden, MD   1 year ago Primary osteoarthritis of right knee   [facility] at [facility], Nyoka Cowden, MD   1 year ago Essential (primary) hypertension   [place] [facility] Medicine at [facility], Nyoka Cowden, MD       Future Appointments             In 1 month Judithann Graves, Nyoka Cowden, MD [facility] at [facility], [facility]

## 2023-11-12 DIAGNOSIS — I2510 Atherosclerotic heart disease of native coronary artery without angina pectoris: Secondary | ICD-10-CM | POA: Diagnosis not present

## 2023-11-12 DIAGNOSIS — I700 Atherosclerosis of aorta: Secondary | ICD-10-CM | POA: Diagnosis not present

## 2023-11-12 DIAGNOSIS — R55 Syncope and collapse: Secondary | ICD-10-CM | POA: Diagnosis not present

## 2023-11-12 DIAGNOSIS — I6523 Occlusion and stenosis of bilateral carotid arteries: Secondary | ICD-10-CM | POA: Diagnosis not present

## 2023-11-13 ENCOUNTER — Other Ambulatory Visit: Payer: Self-pay | Admitting: Internal Medicine

## 2023-11-13 DIAGNOSIS — E119 Type 2 diabetes mellitus without complications: Secondary | ICD-10-CM

## 2023-11-14 ENCOUNTER — Other Ambulatory Visit: Payer: Self-pay | Admitting: Internal Medicine

## 2023-11-17 NOTE — Telephone Encounter (Signed)
 Requested medication (s) are due for refill today: yes  Requested medication (s) are on the active medication list: yes  Last refill:  07/07/19  Future visit scheduled: yes  Notes to clinic:  Unable to refill per protocol, last refill 2020. Should patient continue to take?      Requested Prescriptions  Pending Prescriptions Disp Refills   ezetimibe  (ZETIA ) 10 MG tablet [Pharmacy Med Name: EZETIMIBE  10 MG TABLET] 90 tablet 1    Sig: TAKE 1 TABLET BY MOUTH EVERY DAY     Cardiovascular:  Antilipid - Sterol Transport Inhibitors Failed - 11/17/2023 11:45 AM      Failed - Lipid Panel in normal range within the last 12 months    Cholesterol, Total  Date Value Ref Range Status  11/13/2022 168 100 - 199 mg/dL Final   LDL Chol Calc (NIH)  Date Value Ref Range Status  11/13/2022 86 0 - 99 mg/dL Final   HDL  Date Value Ref Range Status  11/13/2022 60 >39 mg/dL Final   Triglycerides  Date Value Ref Range Status  11/13/2022 122 0 - 149 mg/dL Final         Passed - AST in normal range and within 360 days    AST  Date Value Ref Range Status  07/16/2023 19 15 - 41 U/L Final   SGOT(AST)  Date Value Ref Range Status  12/13/2011 24 15 - 37 Unit/L Final         Passed - ALT in normal range and within 360 days    ALT  Date Value Ref Range Status  07/16/2023 12 0 - 44 U/L Final   SGPT (ALT)  Date Value Ref Range Status  12/13/2011 22 U/L Final    Comment:    12-78 NOTE: NEW REFERENCE RANGE 09/27/2011          Passed - Patient is not pregnant      Passed - Valid encounter within last 12 months    Recent Outpatient Visits           3 months ago Type II diabetes mellitus with complication (HCC)   [place] [facility] at [facility], Leita DEL, MD   8 months ago Type II diabetes mellitus with complication [facility])   [place] Primary Care & Sports Medicine at [facility], Leita DEL, MD   1 year ago Type II diabetes mellitus with  complication [facility])   [place] [facility] at [facility], Leita DEL, MD   1 year ago Primary osteoarthritis of right knee   [place] [facility] at [facility], Leita DEL, MD   1 year ago Essential (primary) hypertension   [place] [facility] & Sports Medicine at [facility], Leita DEL, MD       Future Appointments             In 1 month Justus Leita DEL, MD [facility] Primary Care & Sports Medicine at [facility], [facility]            Signed Prescriptions Disp Refills   pioglitazone  (ACTOS ) 15 MG tablet 90 tablet 0    Sig: TAKE 1 TABLET BY MOUTH EVERY DAY     Endocrinology:  Diabetes - Glitazones - pioglitazone  Passed - 11/17/2023 11:45 AM      Passed - HBA1C is between 0 and 7.9 and within 180 days    Hemoglobin A1C  Date  Value Ref Range Status  07/22/2023 7.4 (A) 4.0 - 5.6 % Final  06/25/2022 6.4  Final   Hgb A1c MFr Bld  Date Value Ref Range Status  03/14/2023 7.8 (H) 4.8 - 5.6 % Final    Comment:             Prediabetes: 5.7 - 6.4          Diabetes: >6.4          Glycemic control for adults with diabetes: <7.0          Passed - Valid encounter within last 6 months    Recent Outpatient Visits           3 months ago Type II diabetes mellitus with complication [facility])   [place] [facility] at [facility], Leita DEL, MD   8 months ago Type II diabetes mellitus with complication [facility])   [place] [facility] at [facility], Leita DEL, MD   1 year ago Type II diabetes mellitus with complication [facility])   [place] [facility] Medicine at [facility], Leita DEL, MD   1 year ago Primary osteoarthritis of right knee   [facility] Primary [facility] & Sports Medicine at [facility], Leita DEL, MD   1 year ago Essential (primary) hypertension   [place] Primary Care & Sports Medicine at  [facility], Leita DEL, MD       Future Appointments             In 1 month Justus, Leita DEL, MD [facility] Primary Care & Sports Medicine at [facility], [facility]

## 2023-11-17 NOTE — Telephone Encounter (Signed)
 Requested medication (s) are due for refill today: yes  Requested medication (s) are on the active medication list: yes  Last refill:  05/12/23 #90 1 RF  Future visit scheduled: yes  Notes to clinic: dis not see that Creatine level addressed in last note specifically   Requested Prescriptions  Pending Prescriptions Disp Refills   JANUVIA 100 MG tablet [Pharmacy Med Name: JANUVIA 100 MG TABLET] 90 tablet 1    Sig: TAKE 1 TABLET BY MOUTH EVERY DAY     Endocrinology:  Diabetes - DPP-4 Inhibitors Failed - 11/17/2023  2:34 PM      Failed - Cr in normal range and within 360 days    Creatinine  Date Value Ref Range Status  07/22/2012 0.96 0.60 - 1.30 mg/dL Final   Creatinine, Ser  Date Value Ref Range Status  07/16/2023 1.28 (H) 0.44 - 1.00 mg/dL Final         Passed - HBA1C is between 0 and 7.9 and within 180 days    Hemoglobin A1C  Date Value Ref Range Status  07/22/2023 7.4 (A) 4.0 - 5.6 % Final  06/25/2022 6.4  Final   Hgb A1c MFr Bld  Date Value Ref Range Status  03/14/2023 7.8 (H) 4.8 - 5.6 % Final    Comment:             Prediabetes: 5.7 - 6.4          Diabetes: >6.4          Glycemic control for adults with diabetes: <7.0          Passed - Valid encounter within last 6 months    Recent Outpatient Visits           3 months ago Type II diabetes mellitus with complication (HCC)   [place] [facility] at [facility], Leita DEL, MD   8 months ago Type II diabetes mellitus with complication [facility])   [place] [facility] Care & Sports Medicine at [facility], Leita DEL, MD   1 year ago Type II diabetes mellitus with complication [facility])   [place] [facility] at [facility], Leita DEL, MD   1 year ago Primary osteoarthritis of right knee   [facility] at [facility], Leita DEL, MD   1 year ago Essential (primary) hypertension   [place] Primary Care  & Sports Medicine at [facility], Leita DEL, MD       Future Appointments             In 1 month Justus, Leita DEL, MD [facility] Primary Care & Sports Medicine at [facility], [facility]

## 2023-11-17 NOTE — Telephone Encounter (Signed)
 Requested Prescriptions  Pending Prescriptions Disp Refills   pioglitazone  (ACTOS ) 15 MG tablet [Pharmacy Med Name: PIOGLITAZONE  HCL 15 MG TABLET] 90 tablet 0    Sig: TAKE 1 TABLET BY MOUTH EVERY DAY     Endocrinology:  Diabetes - Glitazones - pioglitazone  Passed - 11/17/2023 11:44 AM      Passed - HBA1C is between 0 and 7.9 and within 180 days    Hemoglobin A1C  Date Value Ref Range Status  07/22/2023 7.4 (A) 4.0 - 5.6 % Final  06/25/2022 6.4  Final   Hgb A1c MFr Bld  Date Value Ref Range Status  03/14/2023 7.8 (H) 4.8 - 5.6 % Final    Comment:             Prediabetes: 5.7 - 6.4          Diabetes: >6.4          Glycemic control for adults with diabetes: <7.0          Passed - Valid encounter within last 6 months    Recent Outpatient Visits           3 months ago Type II diabetes mellitus with complication [facility]   [place] [facility] at [facility], Leita DEL, MD   8 months ago Type II diabetes mellitus with complication [facility])   [place] [facility] Care & Sports Medicine at [facility], Leita DEL, MD   1 year ago Type II diabetes mellitus with complication [facility])   [place] [facility] Sports Medicine at [place], Leita DEL, MD   1 year ago Primary osteoarthritis of right knee   [place] [facility] at [facility], Leita DEL, MD   1 year ago Essential (primary) hypertension    Primary Care & Sports Medicine at [facility], Leita DEL, MD       Future Appointments             In 1 month Justus, Leita DEL, MD [facility] at [facility] [place], [facility]             ezetimibe  (ZETIA ) 10 MG tablet [Pharmacy Med Name: EZETIMIBE  10 MG TABLET] 90 tablet 1    Sig: TAKE 1 TABLET BY MOUTH EVERY DAY     Cardiovascular:  Antilipid - Sterol Transport Inhibitors Failed - 11/17/2023 11:44 AM      Failed - Lipid Panel in normal range  within the last 12 months    Cholesterol, Total  Date Value Ref Range Status  11/13/2022 168 100 - 199 mg/dL Final   LDL Chol Calc (NIH)  Date Value Ref Range Status  11/13/2022 86 0 - 99 mg/dL Final   HDL  Date Value Ref Range Status  11/13/2022 60 >39 mg/dL Final   Triglycerides  Date Value Ref Range Status  11/13/2022 122 0 - 149 mg/dL Final         Passed - AST in normal range and within 360 days    AST  Date Value Ref Range Status  07/16/2023 19 15 - 41 U/L Final   SGOT(AST)  Date Value Ref Range Status  12/13/2011 24 15 - 37 Unit/L Final         Passed - ALT in normal range and within 360 days    ALT  Date Value Ref Range Status  07/16/2023 12 0 - 44 U/L Final   SGPT (  ALT)  Date Value Ref Range Status  12/13/2011 22 U/L Final    Comment:    12-78 NOTE: NEW REFERENCE RANGE 09/27/2011          Passed - Patient is not pregnant      Passed - Valid encounter within last 12 months    Recent Outpatient Visits           3 months ago Type II diabetes mellitus with complication (HCC)   [place] [facility] Medicine at [facility], Leita DEL, MD   8 months ago Type II diabetes mellitus with complication [facility])   [place] Primary Care & Sports Medicine at [facility], Leita DEL, MD   1 year ago Type II diabetes mellitus with complication [facility])   [place] Primary Care & Sports Medicine at [facility], Leita DEL, MD   1 year ago Primary osteoarthritis of right knee   [facility] & Sports Medicine at [facility], Leita DEL, MD   1 year ago Essential (primary) hypertension   [place] Primary Care & Sports Medicine at [facility], Leita DEL, MD       Future Appointments             In 1 month Justus, Leita DEL, MD [facility] Primary Care & Sports Medicine at [facility], [facility]

## 2023-11-18 DIAGNOSIS — E1169 Type 2 diabetes mellitus with other specified complication: Secondary | ICD-10-CM | POA: Diagnosis not present

## 2023-11-18 DIAGNOSIS — M25561 Pain in right knee: Secondary | ICD-10-CM | POA: Diagnosis not present

## 2023-11-18 DIAGNOSIS — M7651 Patellar tendinitis, right knee: Secondary | ICD-10-CM | POA: Diagnosis not present

## 2023-11-18 DIAGNOSIS — M1711 Unilateral primary osteoarthritis, right knee: Secondary | ICD-10-CM | POA: Diagnosis not present

## 2023-11-18 DIAGNOSIS — E785 Hyperlipidemia, unspecified: Secondary | ICD-10-CM | POA: Diagnosis not present

## 2023-11-19 DIAGNOSIS — E113392 Type 2 diabetes mellitus with moderate nonproliferative diabetic retinopathy without macular edema, left eye: Secondary | ICD-10-CM | POA: Diagnosis not present

## 2023-11-19 DIAGNOSIS — H35351 Cystoid macular degeneration, right eye: Secondary | ICD-10-CM | POA: Diagnosis not present

## 2023-11-19 DIAGNOSIS — T8522XD Displacement of intraocular lens, subsequent encounter: Secondary | ICD-10-CM | POA: Diagnosis not present

## 2023-11-19 DIAGNOSIS — I6523 Occlusion and stenosis of bilateral carotid arteries: Secondary | ICD-10-CM | POA: Insufficient documentation

## 2023-11-19 DIAGNOSIS — H40043 Steroid responder, bilateral: Secondary | ICD-10-CM | POA: Diagnosis not present

## 2023-11-19 DIAGNOSIS — I2510 Atherosclerotic heart disease of native coronary artery without angina pectoris: Secondary | ICD-10-CM | POA: Diagnosis not present

## 2023-11-19 DIAGNOSIS — H2000 Unspecified acute and subacute iridocyclitis: Secondary | ICD-10-CM | POA: Diagnosis not present

## 2023-11-19 LAB — HM DIABETES EYE EXAM

## 2023-12-07 NOTE — Discharge Instructions (Signed)
 Instructions after Total Knee Replacement   Cathy Murphy P. Angie Fava., M.D.    Dept. of Orthopaedics & Sports Medicine [facility] [address] [place], [place]  [address]  Phone: [phone]   Fax: [phone]       [email or web address]       DIET: Drink plenty of non-alcoholic fluids. Resume your normal diet. Include foods high in fiber.  ACTIVITY:  You may use crutches or a walker with weight-bearing as tolerated, unless instructed otherwise. You may be weaned off of the walker or crutches by your Physical Therapist.  Do NOT place pillows under the knee. Anything placed under the knee could limit your ability to straighten the knee.   Continue doing gentle exercises. Exercising will reduce the pain and swelling, increase motion, and prevent muscle weakness.   Please continue to use the TED compression stockings for 6 weeks. You may remove the stockings at night, but should reapply them in the morning. Do not drive or operate any equipment until instructed.  WOUND CARE:  Continue to use the PolarCare or ice packs periodically to reduce pain and swelling. You may bathe or shower after the staples are removed at the first office visit following surgery.  MEDICATIONS: You may resume your regular medications. Please take the pain medication as prescribed on the medication. Do not take pain medication on an empty stomach. You have been given a prescription for a blood thinner (Lovenox or Coumadin). Please take the medication as instructed. (NOTE: After completing a 2 week course of Lovenox, take one Enteric-coated aspirin once a day. This along with elevation will help reduce the possibility of phlebitis in your operated leg.) Do not drive or drink alcoholic beverages when taking pain medications.  CALL THE OFFICE FOR: Temperature above 101 degrees Excessive bleeding or drainage on the dressing. Excessive swelling, coldness, or paleness of the toes. Persistent nausea and  vomiting.  FOLLOW-UP:  You should have an appointment to return to the office in 10-14 days after surgery. Arrangements have been made for continuation of Physical Therapy (either home therapy or outpatient therapy).     [facility] Department Directory         [email or web address]       [email or web address]          Cardiology  Appointments: [place] - [phone]  Endocrinology  Appointments: [place] [phone] [place] - [phone]  Gastroenterology  Appointments: [place] [phone] [place] - [phone]        General Surgery   Appointments: [facility]  Internal Medicine/Family Medicine  Appointments: [facility] - [phone] [place] - [phone]  Metabolic and Weigh Loss Surgery  Appointments: [facility]        Neurology  Appointments: [place] [phone] [place] - [phone]  Neurosurgery  Appointments: [place]  Obstetrics & Gynecology  Appointments: [place] [phone] [place] - [phone]        Pediatrics  Appointments: [place] [phone] [place] - [phone]  Physiatry  Appointments: [employer] [phone]  Physical Therapy  Appointments: [place] - [phone]        Podiatry  Appointments: [place] [phone] [place] - [phone]  Pulmonology  Appointments: [place]  Rheumatology  Appointments: [place] [phone]        [place] Location: [facility]  [address] [place], [place]  [address]  [place] Location: [facility] [address] [place], [place]  [address]  [place] Location: [facility] [address]  [address] [place], [place]  [address]

## 2023-12-09 DIAGNOSIS — M1712 Unilateral primary osteoarthritis, left knee: Secondary | ICD-10-CM | POA: Diagnosis not present

## 2023-12-11 ENCOUNTER — Encounter
Admission: RE | Admit: 2023-12-11 | Discharge: 2023-12-11 | Disposition: A | Discharge status: Home / Self Care | Payer: Medicare Other | Source: Ambulatory Visit | Attending: Orthopedic Surgery | Admitting: Orthopedic Surgery

## 2023-12-11 ENCOUNTER — Other Ambulatory Visit: Payer: Self-pay

## 2023-12-11 VITALS — Ht 69.0 in | Wt 184.0 lb

## 2023-12-11 DIAGNOSIS — Z01812 Encounter for preprocedural laboratory examination: Secondary | ICD-10-CM

## 2023-12-11 DIAGNOSIS — Z880 Allergy status to penicillin: Secondary | ICD-10-CM | POA: Diagnosis not present

## 2023-12-11 DIAGNOSIS — E118 Type 2 diabetes mellitus with unspecified complications: Secondary | ICD-10-CM

## 2023-12-11 DIAGNOSIS — M1712 Unilateral primary osteoarthritis, left knee: Secondary | ICD-10-CM | POA: Diagnosis not present

## 2023-12-11 DIAGNOSIS — I2510 Atherosclerotic heart disease of native coronary artery without angina pectoris: Secondary | ICD-10-CM

## 2023-12-11 DIAGNOSIS — N1831 Chronic kidney disease, stage 3a: Secondary | ICD-10-CM

## 2023-12-11 HISTORY — DX: Atherosclerosis of aorta: I70.0

## 2023-12-11 HISTORY — DX: Polyp of colon: K63.5

## 2023-12-11 HISTORY — DX: Type 2 diabetes mellitus with unspecified complications: E11.8

## 2023-12-11 HISTORY — DX: Essential (primary) hypertension: I10

## 2023-12-11 HISTORY — DX: Wedge compression fracture of T11-T12 vertebra, initial encounter for closed fracture: S22.080A

## 2023-12-11 HISTORY — DX: Other specified disorders of bone density and structure, unspecified site: M85.80

## 2023-12-11 HISTORY — DX: Insomnia, unspecified: G47.00

## 2023-12-11 HISTORY — DX: Chronic kidney disease, stage 3a: N18.31

## 2023-12-11 HISTORY — DX: Atherosclerotic heart disease of native coronary artery without angina pectoris: I25.10

## 2023-12-11 LAB — URINALYSIS, ROUTINE W REFLEX MICROSCOPIC
Bacteria, UA: NONE SEEN
Bilirubin Urine: NEGATIVE
Glucose, UA: 50 mg/dL — AB
Ketones, ur: NEGATIVE mg/dL
Leukocytes,Ua: NEGATIVE
Nitrite: NEGATIVE
Protein, ur: 100 mg/dL — AB
Specific Gravity, Urine: 1.028 (ref 1.005–1.030)
pH: 5 (ref 5.0–8.0)

## 2023-12-11 LAB — SURGICAL PCR SCREEN
MRSA, PCR: NEGATIVE
Staphylococcus aureus: NEGATIVE

## 2023-12-11 LAB — SEDIMENTATION RATE: Sed Rate: 16 mm/h (ref 0–30)

## 2023-12-11 LAB — COMPREHENSIVE METABOLIC PANEL
ALT: 14 U/L (ref 0–44)
AST: 16 U/L (ref 15–41)
Albumin: 4.2 g/dL (ref 3.5–5.0)
Alkaline Phosphatase: 50 U/L (ref 38–126)
Anion gap: 9 (ref 5–15)
BUN: 19 mg/dL (ref 8–23)
CO2: 26 mmol/L (ref 22–32)
Calcium: 9.6 mg/dL (ref 8.9–10.3)
Chloride: 103 mmol/L (ref 98–111)
Creatinine, Ser: 1.24 mg/dL — ABNORMAL HIGH (ref 0.44–1.00)
GFR, Estimated: 44 mL/min — ABNORMAL LOW (ref >60)
Glucose, Bld: 161 mg/dL — ABNORMAL HIGH (ref 70–99)
Potassium: 3.8 mmol/L (ref 3.5–5.1)
Sodium: 138 mmol/L (ref 135–145)
Total Bilirubin: 1.5 mg/dL — ABNORMAL HIGH (ref 0.0–1.2)
Total Protein: 8.1 g/dL (ref 6.5–8.1)

## 2023-12-11 LAB — HEMOGLOBIN A1C
Hgb A1c MFr Bld: 6.8 % — ABNORMAL HIGH (ref 4.8–5.6)
Mean Plasma Glucose: 148.46 mg/dL

## 2023-12-11 LAB — CBC
HCT: 43.5 % (ref 36.0–46.0)
Hemoglobin: 13.6 g/dL (ref 12.0–15.0)
MCH: 29.1 pg (ref 26.0–34.0)
MCHC: 31.3 g/dL (ref 30.0–36.0)
MCV: 92.9 fL (ref 80.0–100.0)
Platelets: 205 10*3/uL (ref 150–400)
RBC: 4.68 MIL/uL (ref 3.87–5.11)
RDW: 13.7 % (ref 11.5–15.5)
WBC: 6.4 10*3/uL (ref 4.0–10.5)
nRBC: 0 % (ref 0.0–0.2)

## 2023-12-11 LAB — C-REACTIVE PROTEIN: CRP: <0.5 mg/dL (ref <1.0)

## 2023-12-11 NOTE — Pre-Procedure Instructions (Signed)
 Copy and pasted from care everywhere cardiology note 11/19/2023:  83 yo female presents for cardiac clearance for left TKA with Dr. Mardee. H/O frequent falls, without LOC. Unclear history of syncope. She has DOE that is new within the last year but stable. She is without chest pain. She has had recent cardiac work up r/t DOE and chest pain that showed normal function on ECHO without significant valvular disease. Lexiscan  Myoview without e/o ischemia. She had a 14-day Holter in 10/2021 at time of frequent falls that did not show severe bradycardia, significant arrhythmia, or high-grade block. Carotid US  11/12/23 showed mild bilateral carotid stenosis.   - Continue current regimen. No changes. - Continue secondary ASCVD prevention with rosuvastatin  and zetia . Pt with aspirin  allergy. - Pt optimized from cardiac standpoint for left TKA with Dr. Mardee.   Electronically signed by Elodie Ronal Tinnie Launie, NP at 11/19/2023 11:10 AM EST

## 2023-12-11 NOTE — Patient Instructions (Signed)
 Your procedure is scheduled on: Wednesday, February 12 Report to the Registration Desk on the 1st floor of the [facility] To find out your arrival time, please call [phone] between 1PM - 3PM on: Tuesday, February 11 If your arrival time is 6:00 am, do not arrive before that time as the [facility] entrance doors do not open until 6:00 am.  REMEMBER: Instructions that are not followed completely may result in serious medical risk, up to and including death; or upon the discretion of your surgeon and anesthesiologist your surgery may need to be rescheduled.  Do not eat food after midnight the night before surgery.  No gum chewing or hard candies.  You may however, drink water up to 2 hours before you are scheduled to arrive for your surgery. Do not drink anything within 2 hours of your scheduled arrival time.  In addition, your doctor has ordered for you to drink the provided:  Gatorade G2 Drinking this carbohydrate drink up to two hours before surgery helps to reduce insulin  resistance and improve patient outcomes. Please complete drinking 2 hours before scheduled arrival time.  One week prior to surgery: starting today, February 6 Stop Anti-inflammatories (NSAIDS) such as Advil, Aleve, Ibuprofen, Motrin, Naproxen, Naprosyn and Aspirin  based products such as Excedrin, Goody's Powder, BC Powder. Stop ANY OVER THE COUNTER supplements until after surgery.  You may however, continue to take Tylenol  if needed for pain up until the day of surgery.  Continue taking all of your other prescription medications up until the day of surgery.  Do NOT take any insulin  (Lantus ) the morning of surgery.  ON THE DAY OF SURGERY ONLY TAKE THESE MEDICATIONS WITH SIPS OF WATER:  dorzolamide -timolol  eye drops ezetimibe  (ZETIA )   No Alcohol for 24 hours before or after surgery.  No Smoking including e-cigarettes for 24 hours before surgery.  No chewable tobacco products for at least 6 hours  before surgery.  No nicotine patches on the day of surgery.  Do not use any recreational drugs for at least a week (preferably 2 weeks) before your surgery.  Please be advised that the combination of cocaine and anesthesia may have negative outcomes, up to and including death. If you test positive for cocaine, your surgery will be cancelled.  On the morning of surgery brush your teeth with toothpaste and water, you may rinse your mouth with mouthwash if you wish. Do not swallow any toothpaste or mouthwash.  Use CHG Soap as directed on instruction sheet.  Do not wear jewelry, make-up, hairpins, clips or nail polish.  For welded (permanent) jewelry: bracelets, anklets, waist bands, etc.  Please have this removed prior to surgery.  If it is not removed, there is a chance that hospital personnel will need to cut it off on the day of surgery.  Do not wear lotions, powders, or perfumes.   Do not shave body hair from the neck down 48 hours before surgery.  Contact lenses, hearing aids and dentures may not be worn into surgery.  Do not bring valuables to the hospital. [facility] is not responsible for any missing/lost belongings or valuables.   Notify your doctor if there is any change in your medical condition (cold, fever, infection).  Wear comfortable clothing (specific to your surgery type) to the hospital.  After surgery, you can help prevent lung complications by doing breathing exercises.  Take deep breaths and cough every 1-2 hours. Your doctor may order a device called an Incentive Spirometer to help you  take deep breaths.  If you are being admitted to the hospital overnight, leave your suitcase in the car. After surgery it may be brought to your room.  In case of increased patient census, it may be necessary for you, the patient, to continue your postoperative care in the Same Day Surgery department.  If you are being discharged the day of surgery, you will not be allowed to  drive home. You will need a responsible individual to drive you home and stay with you for 24 hours after surgery.   If you are taking public transportation, you will need to have a responsible individual with you.  Please call the Pre-admissions Testing Dept. at [phone] if you have any questions about these instructions.  Surgery Visitation Policy:  Patients having surgery or a procedure may have two visitors.  Children under the age of 18 must have an adult with them who is not the patient.  Temporary Visitor Restrictions Due to increasing cases of flu, RSV and COVID-19: Children ages 102 and under will not be able to visit patients in [facility] hospitals under most circumstances.  Inpatient Visitation:    Visiting hours are 7 a.m. to 8 p.m. Up to four visitors are allowed at one time in a patient room. The visitors may rotate out with other people during the day.  One visitor age 24 or older may stay with the patient overnight and must be in the room by 8 p.m.       Pre-operative 5 CHG Bath Instructions   You can play a key role in reducing the risk of infection after surgery. Your skin needs to be as free of germs as possible. You can reduce the number of germs on your skin by washing with CHG (chlorhexidine  gluconate) soap before surgery. CHG is an antiseptic soap that kills germs and continues to kill germs even after washing.   DO NOT use if you have an allergy to chlorhexidine /CHG or antibacterial soaps. If your skin becomes reddened or irritated, stop using the CHG and notify one of our RNs at [phone].   Please shower with the CHG soap starting 4 days before surgery using the following schedule:     Please keep in mind the following:  DO NOT shave, including legs and underarms, starting the day of your first shower.   You may shave your face at any point before/day of surgery.  Place clean sheets on your bed the day you start using CHG soap. Use a clean  washcloth (not used since being washed) for each shower. DO NOT sleep with pets once you start using the CHG.   CHG Shower Instructions:  If you choose to wash your hair and private area, wash first with your normal shampoo/soap.  After you use shampoo/soap, rinse your hair and body thoroughly to remove shampoo/soap residue.  Turn the water OFF and apply about 3 tablespoons (45 ml) of CHG soap to a CLEAN washcloth.  Apply CHG soap ONLY FROM YOUR NECK DOWN TO YOUR TOES (washing for 3-5 minutes)  DO NOT use CHG soap on face, private areas, open wounds, or sores.  Pay special attention to the area where your surgery is being performed.  If you are having back surgery, having someone wash your back for you may be helpful. Wait 2 minutes after CHG soap is applied, then you may rinse off the CHG soap.  Pat dry with a clean towel  Put on clean clothes/pajamas   If  you choose to wear lotion, please use ONLY the CHG-compatible lotions on the back of this paper.     Additional instructions for the day of surgery: DO NOT APPLY any lotions, deodorants, cologne, or perfumes.   Put on clean/comfortable clothes.  Brush your teeth.  Ask your nurse before applying any prescription medications to the skin.      CHG Compatible Lotions   Aveeno Moisturizing lotion  Cetaphil Moisturizing Cream  Cetaphil Moisturizing Lotion  Clairol Herbal Essence Moisturizing Lotion, Dry Skin  Clairol Herbal Essence Moisturizing Lotion, Extra Dry Skin  Clairol Herbal Essence Moisturizing Lotion, Normal Skin  Curel Age Defying Therapeutic Moisturizing Lotion with Alpha Hydroxy  Curel Extreme Care Body Lotion  Curel Soothing Hands Moisturizing Hand Lotion  Curel Therapeutic Moisturizing Cream, Fragrance-Free  Curel Therapeutic Moisturizing Lotion, Fragrance-Free  Curel Therapeutic Moisturizing Lotion, Original Formula  Eucerin Daily Replenishing Lotion  Eucerin Dry Skin Therapy Plus Alpha Hydroxy Crme  Eucerin  Dry Skin Therapy Plus Alpha Hydroxy Lotion  Eucerin Original Crme  Eucerin Original Lotion  Eucerin Plus Crme Eucerin Plus Lotion  Eucerin TriLipid Replenishing Lotion  Keri Anti-Bacterial Hand Lotion  Keri Deep Conditioning Original Lotion Dry Skin Formula Softly Scented  Keri Deep Conditioning Original Lotion, Fragrance Free Sensitive Skin Formula  Keri Lotion Fast Absorbing Fragrance Free Sensitive Skin Formula  Keri Lotion Fast Absorbing Softly Scented Dry Skin Formula  Keri Original Lotion  Keri Skin Renewal Lotion Keri Silky Smooth Lotion  Keri Silky Smooth Sensitive Skin Lotion  Nivea Body Creamy Conditioning Oil  Nivea Body Extra Enriched Lotion  Nivea Body Original Lotion  Nivea Body Sheer Moisturizing Lotion Nivea Crme  Nivea Skin Firming Lotion  NutraDerm 30 Skin Lotion  NutraDerm Skin Lotion  NutraDerm Therapeutic Skin Cream  NutraDerm Therapeutic Skin Lotion  ProShield Protective Hand Cream  Provon moisturizing lotion    Preoperative Educational Videos for Total Hip, Knee and Shoulder Replacements  To better prepare for surgery, please view our videos that explain the physical activity and discharge planning required to have the best surgical recovery at [facility]  [email or web address]  Questions? Call [phone] or email [email or web address]

## 2023-12-14 NOTE — H&P (Signed)
 ORTHOPAEDIC HISTORY & PHYSICAL Drake Fonda Loving, [place] - 12/09/2023 3:15 PM EST Formatting of this note is different from the original. NAME: Cathy Murphy H&P Date: 12/10/2023 Procedure Date: 12/17/2023  Chief Complaint: left knee pain, swelling, and giving way  HPI Cathy Murphy is a 83 y.o. female who has severe left knee pain. She states that she has had left knee pain and has persisted for the last 3 years. She states that most the pain localizes along the medial aspect of her knee. She does report having intermittent swelling, pain and intermittent feelings of the knee giving way. She states that this discomfort is aggravated with any prolonged standing or weightbearing intervention. It has begun to affect her ability to ambulate long distances and perform her ADLs. She has failed conservative treatment including Tylenol , Entresto, corticosteroid injections, viscosupplementation injections, bracing, activity modification and ambulatory aids . Of note, she is unable to tolerate oral NSAIDs at this juncture due to chronic kidney disease. She is currently utilizing a cane for ambulation assistance. She has requested operative intervention for relief of her DJD symptoms. She denies any history of any cardiac or pulmonary issues. Denies any previous clots or DVTs. Patient of note is a diabetic, her last A1c was at 7.4.  Social Hx: Patient is retired and lives at home alone. She does report that she has a daughter and a cousin that both live in the area. States that her daughter is looking to help her after surgery. She denies any alcohol or illicit drug use, no nicotine use or smoking.  Medications & Allergies Allergies: Allergies Allergen Reactions Dapagliflozin Itching Genital yeast infection Oxycodone  Other (See Comments) CNS side effects/agitation Alprazolam Nausea Amoxicillin Diarrhea Aspirin  Nausea And Vomiting Donepezil Rash Leg cramps Hydrocodone  Rash Metaxalone Rash Metformin  Diarrhea Nabumetone Rash Sulfa (Sulfonamide Antibiotics) Unknown and Other (See Comments) Does not remember  Home Medicines: Current Outpatient Medications on File Prior to Visit Medication Sig Dispense Refill acetaminophen  (TYLENOL ) 500 MG tablet Take 1,500 mg by mouth every 6 (six) hours as needed for Pain albuterol  90 mcg/actuation inhaler Inhale 2 inhalations into the lungs every 6 (six) hours as needed for Shortness of Breath BD ULTRA-FINE MINI PEN NEEDLE 31 gauge x 3/16 needle USE AS DIRECTED WITH LEVEMIR  cholecalciferol (VITAMIN D3) 2,000 unit tablet Take 2,000 Units by mouth once daily difluprednate  (DUREZOL ) 0.05 % ophthalmic emulsion Place 1 drop into the right eye 4 (four) times daily difluprednate  (DUREZOL ) 0.05 % ophthalmic emulsion Apply 1 drop to eye 2 (two) times daily dorzolamide -timoloL  (COSOPT ) 22.3-6.8 mg/mL ophthalmic solution ADMINISTER 1 DROP TO BOTH EYES TWO TIMES A DAY. ELDERBERRY FRUIT ORAL Take 1,000 mg by mouth once daily ezetimibe  (ZETIA ) 10 mg tablet TAKE 1 TABLET BY MOUTH EVERY DAY 90 tablet 1 glipiZIDE  (GLUCOTROL ) 5 MG tablet Take 5 mg by mouth 2 (two) times daily glucose blood (CONTOUR TEST STRIPS) test strip hydrOXYzine  (ATARAX ) 25 MG tablet TAKE 0.5 1 TABLETS (12.5 25 MG TOTAL) BY MOUTH EVERY 8 (EIGHT) HOURS AS NEEDED FOR ANXIETY. ketorolac (ACULAR) 0.5 % ophthalmic solution Place 1 drop into the right eye 4 (four) times daily LANTUS  SOLOSTAR U-100 INSULIN  pen injector (concentration 100 units/mL) Inject subcutaneously at bedtime lisinopriL  (ZESTRIL ) 40 MG tablet Take 1 tablet (40 mg total) by mouth once daily 90 tablet 3 LUMIGAN 0.01 % ophthalmic solution Place 1 drop into both eyes nightly.  peg 400-propylene glycol, PF, (SYSTANE, PF,) 0.4-0.3 % ophthalmic drops Place 1 drop into both eyes 2 (two) times daily  pioglitazone  (ACTOS ) 15 MG tablet Take 15 mg by mouth once daily.  rosuvastatin  (CRESTOR ) 40 MG tablet Take 40 mg by mouth once daily SIMBRINZA  1-0.2 % DrpS Place 1 drop into both eyes 2 (two) times daily sitaGLIPtin (JANUVIA) 100 MG tablet Take 100 mg by mouth once daily spironolactone  (ALDACTONE ) 25 MG tablet Take 1 tablet (25 mg total) by mouth once daily 90 tablet 3 traMADoL  (ULTRAM ) 50 mg tablet Take 1 tablet (50 mg total) by mouth every 6 (six) hours as needed for Pain for up to 30 doses 30 tablet 0 traZODone  (DESYREL ) 50 MG tablet Take 50 mg by mouth nightly as needed.   No current facility-administered medications on file prior to visit.  Medical / Surgical History  Past Medical History: Diagnosis Date Allergic rhinitis, seasonal 04/26/2015 Anxiety Anxiety attack Aortic calcification (CMS-HCC) 03/05/2018 Formatting of this note might be different from the original. 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 Atherosclerosis of abdominal aorta (CMS-HCC) 06/09/2019 By ct scan 2017 Bilateral leg edema 05/02/2020 Bilateral neck pain 10/08/2018 BMI 24.0-24.9, adult 11/06/2018 Colon polyp Coronary artery disease involving native coronary artery of native heart 06/09/2019 By ct scan 2017 Degenerative arthritis of right knee 05/03/2014 Diabetes mellitus, type 2 (CMS/HHS-HCC) Difficulty sleeping 12/29/2020 Dyslipidemia Essential (primary) hypertension 04/26/2015 Glaucoma Glaucoma 04/26/2015 Headache disorder 12/29/2020 Hyperlipidemia Hyperlipidemia associated with type 2 diabetes mellitus (CMS/HHS-HCC) 04/26/2015 Hypertension Insomnia 04/26/2015 Formatting of this note might be different from the original. chronic - previously on diazepam, trazodone , sonata Knee pain 05/03/2014 Knee torn cartilage 04/26/2015 Formatting of this note might be different from the original. right knee - seen by Krystal Doyne, PA [facility] Memory loss or impairment 12/29/2020 Osteopenia determined by x-ray 08/05/2019 Formatting of this note might be different from the original. Recommend vitamin D , Calcium , exercise Repeat DEXA  2022 Plantar fasciitis, right 06/16/2015 Primary osteoarthritis of right knee 01/20/2019 Stage 3a chronic kidney disease (CMS/HHS-HCC) 08/16/2020 Type II diabetes mellitus with complication (CMS/HHS-HCC) 11/09/2015 Followed through endocrinology Ventral hernia without obstruction or gangrene 03/06/2016   Past Surgical History: Procedure Laterality Date COLONOSCOPY 11/25/2017 Tortuous colon/Negative colon biopsy/Will decide if repeat needed after CT Colon on 01/09/2018 cataract surgery Bilateral Colonoscopy x2, last 04/02/2012 HYSTERECTOMY Lipoma removed from neck   Physical Exam  Ht:175.3 cm (5' 9) Wt:83.7 kg (184 lb 9.6 oz) BMI: Body mass index is 27.26 kg/m.  General/Constitutional: No apparent distress: well-nourished and well developed. Eyes: Pupils equal, round with synchronous movement. Lymphatic: No palpable adenopathy. Respiratory: Patient has good chest rise and fall with inspiration and expiration. All lung fields are clear to auscultation bilaterally. There is no Rales, rhonchi or wheezes appreciated. Cardiovascular: Upon auscultation there is a regular rate and rhythm without any murmurs, rubs, gallops or heaves appreciated. There does not appear to be any swelling down the lower extremities. Posterior tibial pulses appreciated bilaterally, 2+. Integumentary: No impressive skin lesions present, except as noted in detailed exam. Neuro/Psych: Normal mood and affect, oriented to person, place and time. Musculoskeletal: see exam below  Left knee exam Upon inspection of the patient's left knee there does not appear to be any skin changes, open abrasions, swelling or redness. There is a relative varus alignment. Upon palpation, the patient reports having pain along the medial aspect of their knee. Patient has full extension actively with ROM, and able to flex back to 121 degrees with mild pain. Varus and valgus stress testing shows positive pseudolaxity to valgus stressing. The  patella tracks well within the femoral groove  from flexion into extension with mild crepitation appreciated. Anterior and posterior drawer testing negative. Patient is neurovascularly intact down their lower extremity to all dermatomes. Posterior tibial pulses appreciated 2+.  Imaging left Knee Imaging: A series of x-rays were ordered and interpreted of the patient's left knee. These images included AP, lateral and sunrise views. Upon inspection of the AP view, there is noticeable degenerative changes involving the medial compartment of the knee with 100% joint space narrowing with a bone-on-bone articulation. There are underlying subchondral changes as well as osteophyte formation appreciated. Noted to have a relative varus alignment. Of note there are calcifications present along the posterior margin of the descending vasculature of the lower leg. No fractures, lytic lesions or gross fomites appreciated on films.  Assesment and Plan Left Knee DJD  I have recommended that Cathy Murphy undergo left total knee replacement. Consents has been signed. The risks, benefits, prognosis and alternatives including but not limited to DVT, PE, infection, neurovascular injury, failure of the procedure and death were explained to the patient and she is willing to proceed with surgery as described to her by myself. Plan will be for post operative admission of at least 1 midnight for pain control and PT. She will be managed with DVT prophylaxis, antibiotics preoperatively for 24 hours and aggressive in patient rehab.  Pre, intra and post op interventions were discussed. Patient has good understanding  Medication Reconciliation was performed. Discussed cessation of diabetes medications, vitamins and supplements.  A total of 50 minutes was spent reviewing patient's charts, medical reconciliation, discussing/educating the patient about surgical interventions, and answering any questions provided by the  patient.  JOSHUA DALLAS KOYANAGI, PA [facility] 12/09/2023  Electronically signed by Koyanagi Fonda [place], PA at 12/10/2023 2:32 PM EST

## 2023-12-15 ENCOUNTER — Encounter: Payer: Self-pay | Admitting: Orthopedic Surgery

## 2023-12-15 LAB — IGE: IgE (Immunoglobulin E), Serum: 290 [IU]/mL (ref 6–495)

## 2023-12-15 NOTE — Progress Notes (Signed)
 Perioperative / Anesthesia Services  Pre-Admission Testing Clinical Review / Pre-Operative Anesthesia Consult  Date: 12/15/23  Patient Demographics:  Name: Cathy Murphy DOB: [birth year] MRN:   [id number]  Planned Surgical Procedure(s):    Case: [id number] Date/Time: 12/17/23 1117   Procedure: COMPUTER ASSISTED TOTAL KNEE ARTHROPLASTY (Left: Knee)   Anesthesia type: Choice   Pre-op diagnosis: PRIMARY OSTEOARTHRITIS OF LEFT KNEE.   Location: [facility] OR ROOM 01 / [facility]   Surgeons: Arlyne Lame, MD      NOTE: Available PAT nursing documentation and vital signs have been reviewed. Clinical nursing staff has updated patient's PMH/PSHx, current medication list, and drug allergies/intolerances to ensure comprehensive history available to assist in medical decision making as it pertains to the aforementioned surgical procedure and anticipated anesthetic course. Extensive review of available clinical information personally performed. [place] PMH and PSHx updated with any diagnoses/procedures that  may have been inadvertently omitted during his intake with the pre-admission testing department's nursing staff.  Clinical Discussion:  Cathy Murphy is a 83 y.o. female who is submitted for pre-surgical anesthesia review and clearance prior to her undergoing the above procedure. Patient is a Former Smoker (7 pack years; quit 11/1963). Pertinent PMH includes: CAD, systolic dysfunction, aortic atherosclerosis, BILATERAL carotid artery disease, BILATERAL lower extremity edema, syncope, CKD-III, HTN, HLD, T2DM, frequent falls, OA, osteopenia, cervicalgia, glaucoma, anxiety, memory loss  Patient is followed by cardiology [name], MD). She was last seen in the cardiology clinic on 11/19/2023; notes reviewed. At the time of her clinic visit, patient doing well overall from a cardiovascular perspective.  Patient with chronic exertional dyspnea that was reported to be stable and at  baseline.  Patient attributed to continued shortness of breath to her knee pain.  Patient reported to be an overall difficult historian. Patient with multiple recurrent mechanical falls.  Patient denied any chest pain, PND, orthopnea, palpitations, significant peripheral edema, weakness, fatigue, vertiginous symptoms, or presyncope/syncope. Patient with a past medical history significant for cardiovascular diagnoses. Documented physical exam was grossly benign, providing no evidence of acute exacerbation and/or decompensation of the patient's known cardiovascular conditions.  Cardiac MRI performed on 08/28/2010 revealed a normal left ventricular systolic function with an EF of >55%.  There were no significant regional wall motion abnormalities.  Right ventricular size and function normal.  Aortic valve tricuspid and morphology with no evidence of insufficiency or stenosis.  There was mild tricuspid regurgitation.  Delayed enhancement for viability noted to be normal with no evidence of prior myocardial infarction, scarring, or infiltration.  There were no perfusion defects suggestive of inducible myocardial ischemia.  Pulmonary artery is normal in size.  Aorta normal in size with no evidence of ectasia or aneurysmal dilatation.  TTE performed on 05/14/2023 revealed a normal left ventricular systolic function with an EF of >55%.  There were no regional wall motion abnormalities. Left ventricular diastolic Doppler parameters consistent with abnormal relaxation (G1DD).  Right ventricular size and function normal.  There was mild mitral annular calcification.  Trivial to mild mitral, tricuspid, and pulmonary valve regurgitation observed.  RVSP normal at 30 mmHg. All transvalvular gradients were noted to be normal providing no evidence suggestive of valvular stenosis. Aorta normal in size with no evidence of ectasia or aneurysmal dilatation.  Myocardial perfusion imaging study performed on 10/20/2023 revealed a  normal left ventricular systolic function with hyperdynamic LVEF of 75%.  There were no regional wall motion abnormalities.  There was no evidence of stress-induced myocardial ischemia  or arrhythmia; no scintigraphic evidence of scar.  Study determined to be normal and low risk.  Blood pressure reasonably controlled at 146/70 mmHg on currently prescribed ACEi (lisinopril ) and diuretic (spironolactone ) therapies.  Patient is on ezetimibe  + rosuvastatin  for her HLD diagnosis and ASCVD prevention.  T2DM reasonably controlled on currently prescribed regimen; last HgbA1c was 7.8% when checked on 07/22/2023.  Of note, since patient was last seen by cardiology, her hemoglobin A1c has been rechecked with further improvement down to 6.8 % when checked on 12/11/2023. She does not have an OSAH diagnosis.  Functional capacity somewhat limited by patient's age and multiple medical comorbidities.  Also, as previously mentioned, patient has been experiencing multiple falls related to age-related debility.  With that said, patient able to complete her ADLs/IADLs without cardiovascular limitation.  Per the [facility]ASI, patient is able to achieve at least 4 METS of physical activity without experiencing any significant degree of angina/anginal equivalent symptoms. No changes were made to her medication regimen during her visit with cardiology.  Patient scheduled to follow-up with outpatient cardiology in 6 months or sooner if needed.  Cathy Murphy is scheduled for an elective COMPUTER ASSISTED TOTAL KNEE ARTHROPLASTY (Left: Knee) on 12/17/2023 with Dr. [name], MD.  Given patient's past medical history significant for cardiovascular diagnoses, presurgical cardiac clearance was sought by the PAT team. Per cardiology, "this patient is optimized for surgery and may proceed with the planned procedural course with a ACCEPTABLE risk of significant perioperative cardiovascular complications".  In review of her medication  reconciliation, the patient is not noted to be taking any type of anticoagulation or antiplatelet therapies that would need to be held during her perioperative course.  Patient denies previous perioperative complications with anesthesia in the past. In review her EMR, it is noted that patient underwent a general MAC course at [facility] in 01/2023 without documented complications.      12/11/2023    2:26 PM 07/22/2023   11:00 AM 07/16/2023   10:04 PM  Vitals with BMI  Height 5\' 9"  5\' 9"    Weight 184 lbs 185 lbs   BMI 27.16 27.31   Systolic  128 135  Diastolic  64 71  Pulse  101 58   Providers/Specialists:  NOTE: Primary physician provider listed below. Patient may have been seen by APP or partner within same practice.   PROVIDER ROLE / SPECIALTY LAST OV  Hooten, Robbie Chiles, MD Orthopedics (Surgeon) 12/08/2022  Sheron Dixons, MD Primary Care Provider 07/22/2023  Thais Fill, MD Cardiology 11/19/2023   Allergies:   Allergies  Allergen Reactions   Farxiga [Dapagliflozin] Itching    Genital yeast infection   Alprazolam Nausea Only   Amoxicillin Diarrhea   Aricept [Donepezil] Other (See Comments)    Leg cramps   Aspirin  Nausea And Vomiting   Metformin And Related Diarrhea   Riomet [Metformin Hcl] Nausea And Vomiting   Sulfa Antibiotics     unknown   Hydrocodone  Rash   Nabumetone Rash   Skelaxin [Metaxalone] Rash   Current Home Medications:   No current facility-administered medications for this encounter.    acetaminophen  (TYLENOL ) 500 MG tablet   bimatoprost (LUMIGAN) 0.01 % SOLN   Black Elderberry 1000 MG CAPS   Cholecalciferol (VITAMIN D ) 50 MCG (2000 UT) tablet   Difluprednate  0.05 % EMUL   dorzolamide -timolol  (COSOPT ) 2-0.5 % ophthalmic solution   ezetimibe  (ZETIA ) 10 MG tablet   glipiZIDE  (GLUCOTROL ) 5 MG tablet  hydrOXYzine  (ATARAX ) 25 MG tablet   insulin  glargine (LANTUS  SOLOSTAR) 100 UNIT/ML Solostar Pen    lisinopril  (ZESTRIL ) 40 MG tablet   pioglitazone  (ACTOS ) 15 MG tablet   rosuvastatin  (CRESTOR ) 40 MG tablet   sitaGLIPtin (JANUVIA) 100 MG tablet   spironolactone  (ALDACTONE ) 25 MG tablet   traZODone  (DESYREL ) 50 MG tablet   B-D UF III MINI PEN NEEDLES 31G X 5 MM MISC   glucose blood (ONETOUCH ULTRA) test strip   History:   Past Medical History:  Diagnosis Date   Allergy    Anxiety    Atherosclerosis of abdominal aorta (HCC)    Bilateral carotid artery disease (HCC)    Bilateral lower extremity edema    Cervicalgia    Childhood asthma    Colon polyp    Compression fracture of T11 vertebra (HCC)    Coronary artery disease involving native coronary artery of native heart without angina pectoris    a.) cMRI 08/28/2010: no ischemia/infarct/scar/infiltration; b.) MV 10/20/2023: no ischemia   Diastolic dysfunction    a.) TTE 05/14/2023: EF >55%, no RWMAs, G1DD, mild MAC, triv MR, mild TR/PR   Essential hypertension    Frequent falls    Glaucoma    Headache disorder    History of 2019 novel coronavirus disease (COVID-19) 09/08/2021   History of bilateral cataract extraction 2022   Hyperlipidemia    Insomnia    a.) takes trazodone  PRN   Memory loss    Osteoarthritis    Osteopenia    Stage 3a chronic kidney disease (CKD) (HCC)    Syncope    Type 2 diabetes mellitus with complication [facility])    Past Surgical History:  Procedure Laterality Date   CATARACT EXTRACTION W/PHACO Left 10/01/2021   Procedure: CATARACT EXTRACTION PHACO AND INTRAOCULAR LENS PLACEMENT (IOC) LEFT HYDRUS MICROSTENT DIABETIC;  Surgeon: Rosa College, MD;  Location: [facility];  Service: Ophthalmology;  Laterality: Left;  Diabetic 7.49 00:42.1   CATARACT EXTRACTION W/PHACO Right 10/15/2021   Procedure: PHACO EMULSIFICATION  with HYDRUS MICROSTENT iMPLANTATION;  Surgeon: Rosa College, MD;  Location: [facility];  Service: Ophthalmology;  Laterality: Right;  Diabetic 4.98 00:27.3    COLONOSCOPY WITH PROPOFOL  N/A 11/25/2017   Procedure: COLONOSCOPY WITH PROPOFOL ;  Surgeon: Deveron Fly, MD;  Location: [facility] ENDOSCOPY;  Service: Endoscopy;  Laterality: N/A;   ECTOPIC PREGNANCY SURGERY     LIPOMA RESECTION     right posterior neck   TOTAL ABDOMINAL HYSTERECTOMY W/ BILATERAL SALPINGOOPHORECTOMY     Family History  Problem Relation Age of Onset   Cancer Mother        Breast   Breast cancer Mother    Heart disease Father    Heart attack Brother    Cancer Maternal Aunt        breast   Breast cancer Maternal Aunt    Breast cancer Cousin        mat cousin   Social History   Tobacco Use   Smoking status: Former    Current packs/day: 0.00    Average packs/day: 1 pack/day for 7.0 years (7.0 ttl pk-yrs)    Types: Cigarettes    Start date: 13    Quit date: 1965    Years since quitting: 60.1   Smokeless tobacco: Never   Tobacco comments:    smoking cessation materials not required  Substance Use Topics   Alcohol use: No   Pertinent Clinical Results:  LABS:  Hospital Outpatient Visit on 12/11/2023  Component  Date Value Ref Range Status   MRSA, PCR 12/11/2023 NEGATIVE  NEGATIVE Final   Staphylococcus aureus 12/11/2023 NEGATIVE  NEGATIVE Final   Comment: (NOTE) The Xpert SA Assay (FDA approved for NASAL specimens in patients 46 years of age and older), is one component of a comprehensive surveillance program. It is not intended to diagnose infection nor to guide or monitor treatment. Performed at [facility], [address] [place], [place] [address]    CRP 12/11/2023 <0.5  <1.0 mg/dL Final   Performed at [facility], [address], [place], [place] [address]   Sed Rate 12/11/2023 16  0 - 30 mm/hr Final   Performed at [facility], [address] [place], [place] [address]   Hgb A1c MFr Bld 12/11/2023 6.8 (H)  4.8 - 5.6 % Final   Comment: (NOTE) Pre diabetes:          5.7%-6.4%  Diabetes:               >6.4%  Glycemic control for   <7.0% adults with diabetes    Mean Plasma Glucose 12/11/2023 148.46  mg/dL Final   Performed at [facility], [address], [place], [place] [address]   IgE (Immunoglobulin E), Serum 12/11/2023 290  6 - 495 IU/mL Final   Comment: (NOTE) Performed At: [facility] [address] [place], [place] [id number] Pearlean Botts MD MV:[id number]    WBC 12/11/2023 6.4  4.0 - 10.5 K/uL Final   RBC 12/11/2023 4.68  3.87 - 5.11 MIL/uL Final   Hemoglobin 12/11/2023 13.6  12.0 - 15.0 g/dL Final   HCT 84/13/2440 43.5  36.0 - 46.0 % Final   MCV 12/11/2023 92.9  80.0 - 100.0 fL Final   MCH 12/11/2023 29.1  26.0 - 34.0 pg Final   MCHC 12/11/2023 31.3  30.0 - 36.0 g/dL Final   RDW 08/31/2535 13.7  11.5 - 15.5 % Final   Platelets 12/11/2023 205  150 - 400 K/uL Final   nRBC 12/11/2023 0.0  0.0 - 0.2 % Final   Performed at [facility], [address] [place], [place] [address]   Sodium 12/11/2023 138  135 - 145 mmol/L Final   Potassium 12/11/2023 3.8  3.5 - 5.1 mmol/L Final   Chloride 12/11/2023 103  98 - 111 mmol/L Final   CO2 12/11/2023 26  22 - 32 mmol/L Final   Glucose, Bld 12/11/2023 161 (H)  70 - 99 mg/dL Final   Glucose reference range applies only to samples taken after fasting for at least 8 hours.   BUN 12/11/2023 19  8 - 23 mg/dL Final   Creatinine, Ser 12/11/2023 1.24 (H)  0.44 - 1.00 mg/dL Final   Calcium  12/11/2023 9.6  8.9 - 10.3 mg/dL Final   Total Protein 47/42/5956 8.1  6.5 - 8.1 g/dL Final   Albumin 38/75/6433 4.2  3.5 - 5.0 g/dL Final   AST 29/51/8841 16  15 - 41 U/L Final   ALT 12/11/2023 14  0 - 44 U/L Final   Alkaline Phosphatase 12/11/2023 50  38 - 126 U/L Final   Total Bilirubin 12/11/2023 1.5 (H)  0.0 - 1.2 mg/dL Final   GFR, Estimated 12/11/2023 44 (L)  >60 mL/min Final   Comment: (NOTE) Calculated using the CKD-EPI Creatinine Equation (2021)    Anion gap 12/11/2023 9  5 - 15 Final   Performed at [facility], [address] [place], [place] [address]  Color, Urine 12/11/2023 YELLOW (A)  YELLOW Final   APPearance 12/11/2023 CLEAR (A)  CLEAR Final   Specific Gravity, Urine 12/11/2023 1.028  1.005 - 1.030 Final   pH 12/11/2023 5.0  5.0 - 8.0 Final   Glucose, UA 12/11/2023 50 (A)  NEGATIVE mg/dL Final   Hgb urine dipstick 12/11/2023 SMALL (A)  NEGATIVE Final   Bilirubin Urine 12/11/2023 NEGATIVE  NEGATIVE Final   Ketones, ur 12/11/2023 NEGATIVE  NEGATIVE mg/dL Final   Protein, ur 16/08/9603 100 (A)  NEGATIVE mg/dL Final   Nitrite 54/07/8118 NEGATIVE  NEGATIVE Final   Leukocytes,Ua 12/11/2023 NEGATIVE  NEGATIVE Final   RBC / HPF 12/11/2023 0-5  0 - 5 RBC/hpf Final   WBC, UA 12/11/2023 0-5  0 - 5 WBC/hpf Final   Bacteria, UA 12/11/2023 NONE SEEN  NONE SEEN Final   Squamous Epithelial / HPF 12/11/2023 0-5  0 - 5 /HPF Final   Mucus 12/11/2023 PRESENT   Final   Performed at [facility], [address] [place], [place] [address]    ECG: Date: 04/29/2023  Time ECG obtained: 0940 AM Rate: 71 bpm Rhythm: normal sinus Axis (leads I and aVF): normal Intervals: PR 164 ms. QRS 88 ms. QTc 430 ms. ST segment and T wave changes: No evidence of acute T wave abnormalities or significant ST segment elevation or depression.  Evidence of a possible, age undetermined, prior infarct:  No Comparison: Similar to previous tracing obtained on 06/13/2021 NOTE: Tracing obtained at [facility]; unable for review. Above based on cardiologist's interpretation.    IMAGING / PROCEDURES: DIAGNOSTIC RADIOGRAPHS OF LEFT KNEE 3+ VIEWS performed on 12/09/2023 Noticeable degenerative changes involving the medial compartment of the knee with 100% joint space narrowing with a bone-on-bone articulation.  There are underlying subchondral changes as well as osteophyte formation appreciated.  Noted to have a relative varus alignment.  Of note, there are calcifications present along the posterior  margin of the descending vasculature of the lower leg.  No fractures, lytic lesions or gross fomites appreciated on films.   MYOCARDIAL PERFUSION IMAGING STUDY (LEXISCAN ) performed on 10/20/2023 Normal left ventricular systolic function with a hyperdynamic LVEF of 75% Normal myocardial thickening and wall motion Left ventricular cavity size normal SPECT images demonstrate homogenous tracer distribution throughout the myocardium No evidence of stress-induced myocardial ischemia or arrhythmia Normal low risk study  TRANSTHORACIC ECHOCARDIOGRAM performed on 05/14/2023 Normal left ventricular systolic function with an EF of >55% Left ventricular diastolic Doppler parameters consistent with abnormal relaxation (G1DD). Normal right ventricular systolic function Mild mitral annular calcification Trivial MR Mild TR and PR RVSP = 30.0 mmHg Normal transvalvular gradients; no valvular stenosis No pericardial effusion  Impression and Plan:  Cathy Murphy has been referred for pre-anesthesia review and clearance prior to her undergoing the planned anesthetic and procedural courses. Available labs, pertinent testing, and imaging results were personally reviewed by me in preparation for upcoming operative/procedural course. [facility] medical record has been updated following extensive record review and patient interview with PAT staff.   This patient has been appropriately cleared by cardiology with an overall ACCEPTABLE risk of experiencing significant perioperative cardiovascular complications. Based on clinical review performed today (12/15/23), barring any significant acute changes in the patient's overall condition, it is anticipated that she will be able to proceed with the planned surgical intervention. Any acute changes in clinical condition may necessitate her procedure being postponed and/or cancelled. Patient will meet with anesthesia team (MD and/or CRNA) on the day of  her procedure for  preoperative evaluation/assessment. Questions regarding anesthetic course will be fielded at that time.   Pre-surgical instructions were reviewed with the patient during his PAT appointment, and questions were fielded to satisfaction by PAT clinical staff. She has been instructed on which medications that she will need to hold prior to surgery, as well as the ones that have been deemed safe/appropriate to take on the day of his procedure. As part of the general education provided by PAT, patient made aware both verbally and in writing, that she would need to abstain from the use of any illegal substances during his perioperative course. She was advised that failure to follow the provided instructions could necessitate case cancellation or result in serious perioperative complications up to and including death. Patient encouraged to contact PAT and/or her surgeon's office to discuss any questions or concerns that may arise prior to surgery; verbalized understanding.   Renate Caroline, MSN, APRN, FNP-C, CEN [facility]  Perioperative Services Nurse Practitioner Phone: [phone] Fax: [phone] 12/15/23 1:03 PM  NOTE: This note has been prepared using Dragon dictation software. Despite my best ability to proofread, there is always the potential that unintentional transcriptional errors may still occur from this process.

## 2023-12-17 ENCOUNTER — Other Ambulatory Visit: Payer: Self-pay

## 2023-12-17 ENCOUNTER — Ambulatory Visit: Payer: Medicare Other | Admitting: Urgent Care

## 2023-12-17 ENCOUNTER — Observation Stay: Payer: Medicare Other

## 2023-12-17 ENCOUNTER — Observation Stay
Admission: RE | Admit: 2023-12-17 | Discharge: 2023-12-18 | Disposition: A | Discharge status: Home / Self Care | Payer: Medicare Other | Source: Ambulatory Visit | Attending: Orthopedic Surgery | Admitting: Orthopedic Surgery

## 2023-12-17 ENCOUNTER — Encounter
Admission: RE | Disposition: A | Discharge status: Home / Self Care | Payer: Self-pay | Source: Ambulatory Visit | Attending: Orthopedic Surgery

## 2023-12-17 ENCOUNTER — Encounter: Payer: Self-pay | Admitting: Orthopedic Surgery

## 2023-12-17 DIAGNOSIS — Z96652 Presence of left artificial knee joint: Secondary | ICD-10-CM | POA: Diagnosis not present

## 2023-12-17 DIAGNOSIS — Z01812 Encounter for preprocedural laboratory examination: Secondary | ICD-10-CM

## 2023-12-17 DIAGNOSIS — E118 Type 2 diabetes mellitus with unspecified complications: Secondary | ICD-10-CM

## 2023-12-17 DIAGNOSIS — I2510 Atherosclerotic heart disease of native coronary artery without angina pectoris: Secondary | ICD-10-CM

## 2023-12-17 DIAGNOSIS — I129 Hypertensive chronic kidney disease with stage 1 through stage 4 chronic kidney disease, or unspecified chronic kidney disease: Secondary | ICD-10-CM | POA: Diagnosis not present

## 2023-12-17 DIAGNOSIS — M1712 Unilateral primary osteoarthritis, left knee: Secondary | ICD-10-CM | POA: Diagnosis not present

## 2023-12-17 DIAGNOSIS — E1122 Type 2 diabetes mellitus with diabetic chronic kidney disease: Secondary | ICD-10-CM | POA: Diagnosis not present

## 2023-12-17 DIAGNOSIS — Z880 Allergy status to penicillin: Secondary | ICD-10-CM

## 2023-12-17 DIAGNOSIS — Z794 Long term (current) use of insulin: Secondary | ICD-10-CM | POA: Diagnosis not present

## 2023-12-17 DIAGNOSIS — Z7984 Long term (current) use of oral hypoglycemic drugs: Secondary | ICD-10-CM | POA: Diagnosis not present

## 2023-12-17 DIAGNOSIS — N1831 Chronic kidney disease, stage 3a: Secondary | ICD-10-CM

## 2023-12-17 DIAGNOSIS — Z7901 Long term (current) use of anticoagulants: Secondary | ICD-10-CM | POA: Insufficient documentation

## 2023-12-17 DIAGNOSIS — Z87891 Personal history of nicotine dependence: Secondary | ICD-10-CM | POA: Diagnosis not present

## 2023-12-17 DIAGNOSIS — M25562 Pain in left knee: Secondary | ICD-10-CM | POA: Diagnosis present

## 2023-12-17 DIAGNOSIS — R609 Edema, unspecified: Secondary | ICD-10-CM | POA: Diagnosis not present

## 2023-12-17 HISTORY — DX: Other ill-defined heart diseases: I51.89

## 2023-12-17 HISTORY — DX: Syncope and collapse: R55

## 2023-12-17 HISTORY — DX: Repeated falls: R29.6

## 2023-12-17 HISTORY — DX: Unspecified osteoarthritis, unspecified site: M19.90

## 2023-12-17 HISTORY — DX: Cervicalgia: M54.2

## 2023-12-17 HISTORY — DX: Headache, unspecified: R51.9

## 2023-12-17 HISTORY — DX: Disorder of arteries and arterioles, unspecified: I77.9

## 2023-12-17 HISTORY — DX: Localized edema: R60.0

## 2023-12-17 HISTORY — DX: Other amnesia: R41.3

## 2023-12-17 HISTORY — DX: Unspecified asthma, uncomplicated: J45.909

## 2023-12-17 HISTORY — PX: KNEE ARTHROPLASTY: SHX992

## 2023-12-17 LAB — GLUCOSE, CAPILLARY
Glucose-Capillary: 69 mg/dL — ABNORMAL LOW (ref 70–99)
Glucose-Capillary: 65 mg/dL — ABNORMAL LOW (ref 70–99)
Glucose-Capillary: 116 mg/dL — ABNORMAL HIGH (ref 70–99)
Glucose-Capillary: 155 mg/dL — ABNORMAL HIGH (ref 70–99)
Glucose-Capillary: 254 mg/dL — ABNORMAL HIGH (ref 70–99)

## 2023-12-17 SURGERY — ARTHROPLASTY, KNEE, TOTAL, USING IMAGELESS COMPUTER-ASSISTED NAVIGATION
Anesthesia: Spinal | Site: Knee | Laterality: Left

## 2023-12-17 MED ORDER — METOCLOPRAMIDE HCL 10 MG PO TABS
10.0000 mg | ORAL_TABLET | Freq: Three times a day (TID) | ORAL | Status: DC
  Administered 2023-12-17 – 2023-12-18 (×2): 10 mg via ORAL
  Filled 2023-12-17: qty 1

## 2023-12-17 MED ORDER — LINAGLIPTIN 5 MG PO TABS
ORAL_TABLET | ORAL | Status: AC
  Filled 2023-12-17: qty 1

## 2023-12-17 MED ORDER — FERROUS SULFATE 325 (65 FE) MG PO TABS
325.0000 mg | ORAL_TABLET | Freq: Two times a day (BID) | ORAL | Status: DC
  Administered 2023-12-17 – 2023-12-18 (×2): 325 mg via ORAL
  Filled 2023-12-17 (×2): qty 1

## 2023-12-17 MED ORDER — ORAL CARE MOUTH RINSE: 15.0000 mL | Freq: Once | OROMUCOSAL | Status: AC

## 2023-12-17 MED ORDER — EZETIMIBE 10 MG PO TABS
10.0000 mg | ORAL_TABLET | Freq: Every day | ORAL | Status: DC
  Administered 2023-12-18: 10 mg via ORAL

## 2023-12-17 MED ORDER — ACETAMINOPHEN 325 MG PO TABS: 325.0000 mg | ORAL_TABLET | Freq: Four times a day (QID) | ORAL | Status: DC | PRN

## 2023-12-17 MED ORDER — CELECOXIB 200 MG PO CAPS
ORAL_CAPSULE | ORAL | Status: AC
  Filled 2023-12-17: qty 1

## 2023-12-17 MED ORDER — ACETAMINOPHEN 10 MG/ML IV SOLN
INTRAVENOUS | Status: DC | PRN
  Administered 2023-12-17: 1000 mg via INTRAVENOUS

## 2023-12-17 MED ORDER — FENTANYL CITRATE (PF) 100 MCG/2ML IJ SOLN
INTRAMUSCULAR | Status: AC
  Filled 2023-12-17: qty 2

## 2023-12-17 MED ORDER — PIOGLITAZONE HCL 15 MG PO TABS
15.0000 mg | ORAL_TABLET | Freq: Every day | ORAL | Status: DC
  Administered 2023-12-17 – 2023-12-18 (×2): 15 mg via ORAL
  Filled 2023-12-17 (×2): qty 1

## 2023-12-17 MED ORDER — DORZOLAMIDE HCL-TIMOLOL MAL 2-0.5 % OP SOLN
1.0000 [drp] | Freq: Two times a day (BID) | OPHTHALMIC | Status: DC
  Administered 2023-12-17: 1 [drp] via OPHTHALMIC
  Filled 2023-12-17: qty 10

## 2023-12-17 MED ORDER — PROPOFOL 500 MG/50ML IV EMUL
INTRAVENOUS | Status: DC | PRN
  Administered 2023-12-17: 100 ug/kg/min via INTRAVENOUS
  Administered 2023-12-17: 50 mg via INTRAVENOUS

## 2023-12-17 MED ORDER — DIPHENHYDRAMINE HCL 12.5 MG/5ML PO ELIX: 12.5000 mg | ORAL_SOLUTION | ORAL | Status: DC | PRN

## 2023-12-17 MED ORDER — CEFAZOLIN SODIUM-DEXTROSE 2-4 GM/100ML-% IV SOLN
2.0000 g | INTRAVENOUS | Status: AC
  Administered 2023-12-17: 2 g via INTRAVENOUS

## 2023-12-17 MED ORDER — TRANEXAMIC ACID-NACL 1000-0.7 MG/100ML-% IV SOLN
INTRAVENOUS | Status: AC
  Filled 2023-12-17: qty 100

## 2023-12-17 MED ORDER — TRANEXAMIC ACID-NACL 1000-0.7 MG/100ML-% IV SOLN
1000.0000 mg | Freq: Once | INTRAVENOUS | Status: AC
  Administered 2023-12-17: 1000 mg via INTRAVENOUS

## 2023-12-17 MED ORDER — PHENOL 1.4 % MT LIQD: 1.0000 | OROMUCOSAL | Status: DC | PRN

## 2023-12-17 MED ORDER — SENNOSIDES-DOCUSATE SODIUM 8.6-50 MG PO TABS
ORAL_TABLET | ORAL | Status: AC
  Filled 2023-12-17: qty 1

## 2023-12-17 MED ORDER — TRANEXAMIC ACID-NACL 1000-0.7 MG/100ML-% IV SOLN
1000.0000 mg | INTRAVENOUS | Status: AC
  Administered 2023-12-17: 1000 mg via INTRAVENOUS

## 2023-12-17 MED ORDER — CELECOXIB 200 MG PO CAPS
400.0000 mg | ORAL_CAPSULE | Freq: Once | ORAL | Status: AC
  Administered 2023-12-17: 400 mg via ORAL

## 2023-12-17 MED ORDER — GABAPENTIN 300 MG PO CAPS
ORAL_CAPSULE | ORAL | Status: AC
  Filled 2023-12-17: qty 1

## 2023-12-17 MED ORDER — INSULIN ASPART 100 UNIT/ML IJ SOLN
0.0000 [IU] | Freq: Three times a day (TID) | INTRAMUSCULAR | Status: DC
  Administered 2023-12-18: 3 [IU] via SUBCUTANEOUS
  Filled 2023-12-17: qty 1

## 2023-12-17 MED ORDER — TRAMADOL HCL 50 MG PO TABS: 50.0000 mg | ORAL_TABLET | ORAL | Status: DC | PRN

## 2023-12-17 MED ORDER — DEXTROSE 50 % IV SOLN
0.5000 | Freq: Once | INTRAVENOUS | Status: AC
Start: 2023-12-17 — End: 2023-12-17
  Administered 2023-12-17: 25 mL via INTRAVENOUS

## 2023-12-17 MED ORDER — PHENYLEPHRINE HCL-NACL 20-0.9 MG/250ML-% IV SOLN
INTRAVENOUS | Status: AC
  Filled 2023-12-17: qty 250

## 2023-12-17 MED ORDER — ALUM & MAG HYDROXIDE-SIMETH 200-200-20 MG/5ML PO SUSP: 30.0000 mL | ORAL | Status: DC | PRN

## 2023-12-17 MED ORDER — PROPOFOL 1000 MG/100ML IV EMUL
INTRAVENOUS | Status: AC
  Filled 2023-12-17: qty 100

## 2023-12-17 MED ORDER — OXYCODONE HCL 5 MG PO TABS
10.0000 mg | ORAL_TABLET | ORAL | Status: DC | PRN
  Administered 2023-12-17: 10 mg via ORAL

## 2023-12-17 MED ORDER — SPIRONOLACTONE 25 MG PO TABS
25.0000 mg | ORAL_TABLET | Freq: Every day | ORAL | Status: DC
  Administered 2023-12-17: 25 mg via ORAL
  Filled 2023-12-17 (×2): qty 1

## 2023-12-17 MED ORDER — DEXTROSE 50 % IV SOLN
INTRAVENOUS | Status: AC
  Filled 2023-12-17: qty 50

## 2023-12-17 MED ORDER — CHLORHEXIDINE GLUCONATE 4 % EX SOLN
60.0000 mL | Freq: Once | CUTANEOUS | Status: AC
  Administered 2023-12-17: 4 via TOPICAL

## 2023-12-17 MED ORDER — LISINOPRIL 20 MG PO TABS
ORAL_TABLET | ORAL | Status: AC
  Filled 2023-12-17: qty 2

## 2023-12-17 MED ORDER — SURGIPHOR WOUND IRRIGATION SYSTEM - OPTIME: TOPICAL | Status: DC | PRN

## 2023-12-17 MED ORDER — GABAPENTIN 300 MG PO CAPS
300.0000 mg | ORAL_CAPSULE | Freq: Once | ORAL | Status: AC
  Administered 2023-12-17: 300 mg via ORAL

## 2023-12-17 MED ORDER — CEFAZOLIN SODIUM-DEXTROSE 2-4 GM/100ML-% IV SOLN
INTRAVENOUS | Status: AC
  Filled 2023-12-17: qty 100

## 2023-12-17 MED ORDER — FLEET ENEMA RE ENEM: 1.0000 | ENEMA | Freq: Once | RECTAL | Status: DC | PRN

## 2023-12-17 MED ORDER — MAGNESIUM HYDROXIDE 400 MG/5ML PO SUSP
30.0000 mL | Freq: Every day | ORAL | Status: DC
  Filled 2023-12-17 (×2): qty 30

## 2023-12-17 MED ORDER — DEXAMETHASONE SODIUM PHOSPHATE 10 MG/ML IJ SOLN
INTRAMUSCULAR | Status: AC
  Filled 2023-12-17: qty 1

## 2023-12-17 MED ORDER — CHLORHEXIDINE GLUCONATE 0.12 % MT SOLN
15.0000 mL | Freq: Once | OROMUCOSAL | Status: AC
  Administered 2023-12-17: 15 mL via OROMUCOSAL

## 2023-12-17 MED ORDER — CELECOXIB 200 MG PO CAPS
ORAL_CAPSULE | ORAL | Status: AC
  Filled 2023-12-17: qty 2

## 2023-12-17 MED ORDER — PHENYLEPHRINE 80 MCG/ML (10ML) SYRINGE FOR IV PUSH (FOR BLOOD PRESSURE SUPPORT)
PREFILLED_SYRINGE | INTRAVENOUS | Status: DC | PRN
  Administered 2023-12-17: 80 ug via INTRAVENOUS

## 2023-12-17 MED ORDER — PANTOPRAZOLE SODIUM 40 MG PO TBEC
DELAYED_RELEASE_TABLET | ORAL | Status: AC
  Filled 2023-12-17: qty 1

## 2023-12-17 MED ORDER — ROSUVASTATIN CALCIUM 10 MG PO TABS
40.0000 mg | ORAL_TABLET | Freq: Every day | ORAL | Status: DC
  Administered 2023-12-17: 40 mg via ORAL

## 2023-12-17 MED ORDER — CELECOXIB 200 MG PO CAPS
200.0000 mg | ORAL_CAPSULE | Freq: Two times a day (BID) | ORAL | Status: DC
  Administered 2023-12-17 – 2023-12-18 (×2): 200 mg via ORAL
  Filled 2023-12-17: qty 1

## 2023-12-17 MED ORDER — ACETAMINOPHEN 10 MG/ML IV SOLN
INTRAVENOUS | Status: AC
  Filled 2023-12-17: qty 100

## 2023-12-17 MED ORDER — SODIUM CHLORIDE 0.9 % IV SOLN
INTRAVENOUS | Status: DC
Start: 2023-12-17 — End: 2023-12-17

## 2023-12-17 MED ORDER — FENTANYL CITRATE (PF) 100 MCG/2ML IJ SOLN: 25.0000 ug | INTRAMUSCULAR | Status: DC | PRN

## 2023-12-17 MED ORDER — HYDROXYZINE HCL 25 MG PO TABS: 12.5000 mg | ORAL_TABLET | Freq: Three times a day (TID) | ORAL | Status: DC | PRN

## 2023-12-17 MED ORDER — ONDANSETRON HCL 4 MG PO TABS: 4.0000 mg | ORAL_TABLET | Freq: Four times a day (QID) | ORAL | Status: DC | PRN

## 2023-12-17 MED ORDER — ASPIRIN 81 MG PO CHEW
CHEWABLE_TABLET | ORAL | Status: AC
  Filled 2023-12-17: qty 1

## 2023-12-17 MED ORDER — LINAGLIPTIN 5 MG PO TABS
5.0000 mg | ORAL_TABLET | Freq: Every day | ORAL | Status: DC
  Administered 2023-12-17 – 2023-12-18 (×2): 5 mg via ORAL

## 2023-12-17 MED ORDER — ONDANSETRON HCL 4 MG/2ML IJ SOLN: 4.0000 mg | Freq: Four times a day (QID) | INTRAMUSCULAR | Status: DC | PRN

## 2023-12-17 MED ORDER — MENTHOL 3 MG MT LOZG: 1.0000 | LOZENGE | OROMUCOSAL | Status: DC | PRN

## 2023-12-17 MED ORDER — SENNOSIDES-DOCUSATE SODIUM 8.6-50 MG PO TABS
1.0000 | ORAL_TABLET | Freq: Two times a day (BID) | ORAL | Status: DC
  Administered 2023-12-17 – 2023-12-18 (×2): 1 via ORAL
  Filled 2023-12-17: qty 1

## 2023-12-17 MED ORDER — HYDROMORPHONE HCL 1 MG/ML IJ SOLN: 0.5000 mg | INTRAMUSCULAR | Status: DC | PRN

## 2023-12-17 MED ORDER — INSULIN GLARGINE-YFGN 100 UNIT/ML ~~LOC~~ SOLN
30.0000 [IU] | Freq: Every morning | SUBCUTANEOUS | Status: DC
  Administered 2023-12-18: 30 [IU] via SUBCUTANEOUS
  Filled 2023-12-17: qty 0.3

## 2023-12-17 MED ORDER — DEXAMETHASONE SODIUM PHOSPHATE 10 MG/ML IJ SOLN
8.0000 mg | Freq: Once | INTRAMUSCULAR | Status: AC
  Administered 2023-12-17: 8 mg via INTRAVENOUS

## 2023-12-17 MED ORDER — BISACODYL 10 MG RE SUPP: 10.0000 mg | Freq: Every day | RECTAL | Status: DC | PRN

## 2023-12-17 MED ORDER — OXYCODONE HCL 5 MG PO TABS
ORAL_TABLET | ORAL | Status: AC
Start: 2023-12-17 — End: ?
  Filled 2023-12-17: qty 2

## 2023-12-17 MED ORDER — ACETAMINOPHEN 10 MG/ML IV SOLN
1000.0000 mg | Freq: Four times a day (QID) | INTRAVENOUS | Status: DC
  Administered 2023-12-17 – 2023-12-18 (×3): 1000 mg via INTRAVENOUS
  Filled 2023-12-17 (×2): qty 100

## 2023-12-17 MED ORDER — INSULIN ASPART 100 UNIT/ML IJ SOLN
0.0000 [IU] | Freq: Every day | INTRAMUSCULAR | Status: DC
  Administered 2023-12-17: 3 [IU] via SUBCUTANEOUS

## 2023-12-17 MED ORDER — CEFAZOLIN SODIUM-DEXTROSE 2-4 GM/100ML-% IV SOLN
2.0000 g | Freq: Four times a day (QID) | INTRAVENOUS | Status: AC
  Administered 2023-12-17 (×2): 2 g via INTRAVENOUS
  Filled 2023-12-17: qty 100

## 2023-12-17 MED ORDER — BUPIVACAINE HCL (PF) 0.5 % IJ SOLN
INTRAMUSCULAR | Status: AC
  Filled 2023-12-17: qty 10

## 2023-12-17 MED ORDER — METOCLOPRAMIDE HCL 10 MG PO TABS
ORAL_TABLET | ORAL | Status: AC
  Filled 2023-12-17: qty 1

## 2023-12-17 MED ORDER — INSULIN ASPART 100 UNIT/ML IJ SOLN
INTRAMUSCULAR | Status: AC
  Filled 2023-12-17: qty 1

## 2023-12-17 MED ORDER — BUPIVACAINE HCL (PF) 0.25 % IJ SOLN
INTRAMUSCULAR | Status: DC | PRN
  Administered 2023-12-17: 120 mL via INTRAMUSCULAR

## 2023-12-17 MED ORDER — ROSUVASTATIN CALCIUM 20 MG PO TABS
ORAL_TABLET | ORAL | Status: AC
  Filled 2023-12-17: qty 2

## 2023-12-17 MED ORDER — OXYCODONE HCL 5 MG PO TABS
5.0000 mg | ORAL_TABLET | ORAL | Status: DC | PRN
  Administered 2023-12-18: 5 mg via ORAL
  Filled 2023-12-17: qty 1

## 2023-12-17 MED ORDER — CHLORHEXIDINE GLUCONATE 0.12 % MT SOLN
OROMUCOSAL | Status: AC
  Filled 2023-12-17: qty 15

## 2023-12-17 MED ORDER — DIFLUPREDNATE 0.05 % OP EMUL: 1.0000 [drp] | Freq: Two times a day (BID) | OPHTHALMIC | Status: DC

## 2023-12-17 MED ORDER — FENTANYL CITRATE (PF) 100 MCG/2ML IJ SOLN
INTRAMUSCULAR | Status: DC | PRN
  Administered 2023-12-17: 50 ug via INTRAVENOUS

## 2023-12-17 MED ORDER — LATANOPROST 0.005 % OP SOLN
1.0000 [drp] | Freq: Every day | OPHTHALMIC | Status: DC
  Administered 2023-12-17: 1 [drp] via OPHTHALMIC
  Filled 2023-12-17: qty 2.5

## 2023-12-17 MED ORDER — LISINOPRIL 20 MG PO TABS
40.0000 mg | ORAL_TABLET | Freq: Every day | ORAL | Status: DC
Start: 2023-12-17 — End: 2023-12-18
  Administered 2023-12-17: 40 mg via ORAL

## 2023-12-17 MED ORDER — BUPIVACAINE HCL (PF) 0.5 % IJ SOLN
INTRAMUSCULAR | Status: DC | PRN
  Administered 2023-12-17: 2.5 mL

## 2023-12-17 MED ORDER — SODIUM CHLORIDE 0.9 % IR SOLN
Status: DC | PRN
  Administered 2023-12-17: 3000 mL

## 2023-12-17 MED ORDER — PHENYLEPHRINE HCL-NACL 20-0.9 MG/250ML-% IV SOLN
INTRAVENOUS | Status: DC | PRN
  Administered 2023-12-17: 40 ug/min via INTRAVENOUS

## 2023-12-17 MED ORDER — SODIUM CHLORIDE 0.9 % IV SOLN
INTRAVENOUS | Status: DC
Start: 2023-12-17 — End: 2023-12-18

## 2023-12-17 MED ORDER — GLIPIZIDE 5 MG PO TABS
5.0000 mg | ORAL_TABLET | Freq: Two times a day (BID) | ORAL | Status: DC
  Administered 2023-12-17 – 2023-12-18 (×2): 5 mg via ORAL
  Filled 2023-12-17 (×3): qty 1

## 2023-12-17 MED ORDER — TRAZODONE HCL 50 MG PO TABS: 50.0000 mg | ORAL_TABLET | Freq: Every evening | ORAL | Status: DC | PRN

## 2023-12-17 MED ORDER — PANTOPRAZOLE SODIUM 40 MG PO TBEC
40.0000 mg | DELAYED_RELEASE_TABLET | Freq: Two times a day (BID) | ORAL | Status: DC
  Administered 2023-12-17 – 2023-12-18 (×2): 40 mg via ORAL
  Filled 2023-12-17: qty 1

## 2023-12-17 MED ORDER — ASPIRIN 81 MG PO CHEW
81.0000 mg | CHEWABLE_TABLET | Freq: Two times a day (BID) | ORAL | Status: DC
  Administered 2023-12-17 – 2023-12-18 (×2): 81 mg via ORAL
  Filled 2023-12-17: qty 1

## 2023-12-17 SURGICAL SUPPLY — 66 items
ATTUNE PSFEM LTSZ5 NARCEM KNEE (Femur) IMPLANT
ATTUNE PSRP INSR SZ5 8 KNEE (Insert) IMPLANT
BASEPLATE TIBIAL ROTATING SZ 4 (Knees) IMPLANT
BATTERY INSTRU NAVIGATION (MISCELLANEOUS) ×4 IMPLANT
BIT DRILL QUICK REL 1/8 2PK SL (BIT) ×1 IMPLANT
BLADE CLIPPER SURG (BLADE) IMPLANT
BLADE SAW 70X12.5 (BLADE) ×1 IMPLANT
BLADE SAW 90X13X1.19 OSCILLAT (BLADE) ×1 IMPLANT
BLADE SAW 90X25X1.19 OSCILLAT (BLADE) ×1 IMPLANT
BONE CEMENT GENTAMICIN (Cement) ×2 IMPLANT
BRUSH SCRUB EZ PLAIN DRY (MISCELLANEOUS) ×1 IMPLANT
CEMENT BONE GENTAMICIN 40 (Cement) IMPLANT
CEMENT HV SMART SET (Cement) IMPLANT
COOLER POLAR GLACIER W/PUMP (MISCELLANEOUS) ×1 IMPLANT
CUFF TRNQT CYL 24X4X16.5-23 (TOURNIQUET CUFF) IMPLANT
CUFF TRNQT CYL 30X4X21-28X (TOURNIQUET CUFF) IMPLANT
DRAPE SHEET LG 3/4 BI-LAMINATE (DRAPES) ×1 IMPLANT
DRSG AQUACEL AG ADV 3.5X14 (GAUZE/BANDAGES/DRESSINGS) ×1 IMPLANT
DRSG MEPILEX SACRM 8.7X9.8 (GAUZE/BANDAGES/DRESSINGS) ×1 IMPLANT
DRSG TEGADERM 4X4.75 (GAUZE/BANDAGES/DRESSINGS) ×1 IMPLANT
DURAPREP 26ML APPLICATOR (WOUND CARE) ×2 IMPLANT
ELECT CAUTERY BLADE 6.4 (BLADE) ×1 IMPLANT
ELECT REM PT RETURN 9FT ADLT (ELECTROSURGICAL) ×1
ELECTRODE REM PT RTRN 9FT ADLT (ELECTROSURGICAL) ×1 IMPLANT
EVACUATOR 1/8 PVC DRAIN (DRAIN) ×1 IMPLANT
EX-PIN ORTHOLOCK NAV 4X150 (PIN) ×2 IMPLANT
GAUZE XEROFORM 1X8 LF (GAUZE/BANDAGES/DRESSINGS) ×1 IMPLANT
GLOVE BIOGEL M STRL SZ7.5 (GLOVE) ×6 IMPLANT
GLOVE SURG UNDER POLY LF SZ8 (GLOVE) ×2 IMPLANT
GOWN STRL REUS W/ TWL LRG LVL3 (GOWN DISPOSABLE) ×1 IMPLANT
GOWN STRL REUS W/ TWL XL LVL3 (GOWN DISPOSABLE) ×1 IMPLANT
GOWN TOGA ZIPPER T7+ PEEL AWAY (MISCELLANEOUS) ×1 IMPLANT
HOLDER FOLEY CATH W/STRAP (MISCELLANEOUS) ×1 IMPLANT
HOOD PEEL AWAY T7 (MISCELLANEOUS) ×1 IMPLANT
IV NS IRRIG 3000ML ARTHROMATIC (IV SOLUTION) ×1 IMPLANT
KIT TURNOVER KIT A (KITS) ×1 IMPLANT
KNIFE SCULPS 14X20 (INSTRUMENTS) ×1 IMPLANT
MANIFOLD NEPTUNE II (INSTRUMENTS) ×2 IMPLANT
NDL SPNL 20GX3.5 QUINCKE YW (NEEDLE) ×2 IMPLANT
NEEDLE SPNL 20GX3.5 QUINCKE YW (NEEDLE) ×2
PACK TOTAL KNEE (MISCELLANEOUS) ×1 IMPLANT
PAD ABD DERMACEA PRESS 5X9 (GAUZE/BANDAGES/DRESSINGS) ×2 IMPLANT
PAD ARMBOARD 7.5X6 YLW CONV (MISCELLANEOUS) ×3 IMPLANT
PAD WRAPON POLAR KNEE (MISCELLANEOUS) ×1 IMPLANT
PATELLA MEDIAL ATTUN 35MM KNEE (Knees) IMPLANT
PENCIL SMOKE EVACUATOR COATED (MISCELLANEOUS) ×1 IMPLANT
PIN DRILL FIX HALF THREAD (BIT) ×2 IMPLANT
PIN FIXATION 1/8DIA X 3INL (PIN) ×1 IMPLANT
PULSAVAC PLUS IRRIG FAN TIP (DISPOSABLE) ×1
SOLUTION IRRIG SURGIPHOR (IV SOLUTION) ×1 IMPLANT
SPONGE DRAIN TRACH 4X4 STRL 2S (GAUZE/BANDAGES/DRESSINGS) ×1 IMPLANT
STAPLER SKIN PROX 35W (STAPLE) ×1 IMPLANT
STOCKINETTE STRL BIAS CUT 8X4 (MISCELLANEOUS) ×1 IMPLANT
STRAP TIBIA SHORT (MISCELLANEOUS) ×1 IMPLANT
SUCTION TUBE FRAZIER 10FR DISP (SUCTIONS) ×1 IMPLANT
SUT VIC AB 0 CT1 36 (SUTURE) ×1 IMPLANT
SUT VIC AB 1 CT1 36 (SUTURE) ×2 IMPLANT
SUT VIC AB 2-0 CT2 27 (SUTURE) ×1 IMPLANT
SYR 30ML LL (SYRINGE) ×2 IMPLANT
TIP FAN IRRIG PULSAVAC PLUS (DISPOSABLE) ×1 IMPLANT
TOWEL OR 17X26 4PK STRL BLUE (TOWEL DISPOSABLE) ×1 IMPLANT
TOWER CARTRIDGE SMART MIX (DISPOSABLE) ×1 IMPLANT
TRAP FLUID SMOKE EVACUATOR (MISCELLANEOUS) ×1 IMPLANT
TRAY FOLEY MTR SLVR 16FR STAT (SET/KITS/TRAYS/PACK) ×1 IMPLANT
WATER STERILE IRR 1000ML POUR (IV SOLUTION) ×1 IMPLANT
WRAPON POLAR PAD KNEE (MISCELLANEOUS) ×1

## 2023-12-17 NOTE — Progress Notes (Signed)
Patient is not able to walk the distance required to go the bathroom, or he/she is unable to safely negotiate stairs required to access the bathroom.  A 3in1 BSC will alleviate this problem   Amenda Duclos P. Angie Fava M.D.

## 2023-12-17 NOTE — Transfer of Care (Signed)
Immediate Anesthesia Transfer of Care Note  Patient: Cathy Murphy  Procedure(s) Performed: COMPUTER ASSISTED TOTAL KNEE ARTHROPLASTY (Left: Knee)  Patient Location: PACU  Anesthesia Type:Spinal  Level of Consciousness: drowsy and patient cooperative  Airway & Oxygen Therapy: Patient Spontanous Breathing  Post-op Assessment: Report given to RN and Post -op Vital signs reviewed and stable  Post vital signs: Reviewed and stable  Last Vitals:  Vitals Value Taken Time  BP 96/54 12/17/23 1531  Temp    Pulse 79 12/17/23 1531  Resp 18 12/17/23 1531  SpO2 100 % 12/17/23 1531  Vitals shown include unfiled device data.  Last Pain:  Vitals:   12/17/23 1036  TempSrc: Temporal  PainSc: 0-No pain         Complications: There were no known notable events for this encounter.

## 2023-12-17 NOTE — Anesthesia Procedure Notes (Signed)
Spinal  Patient location during procedure: OR Start time: 12/17/2023 11:49 AM End time: 12/17/2023 12:00 PM Reason for block: surgical anesthesia Staffing Performed: resident/CRNA  Resident/CRNA: Lysbeth Penner, CRNA Performed by: Lysbeth Penner, CRNA Authorized by: Lysbeth Penner, CRNA   Preanesthetic Checklist Completed: patient identified, IV checked, site marked, risks and benefits discussed, surgical consent, monitors and equipment checked, pre-op evaluation and timeout performed Spinal Block Patient position: sitting Prep: DuraPrep Patient monitoring: heart rate, cardiac monitor, continuous pulse ox and blood pressure Approach: midline Location: L3-4 Injection technique: single-shot Needle Needle type: Pencan and Introducer  Needle gauge: 24 G Needle length: 10 cm Assessment Sensory level: T10 Events: CSF return Additional Notes Meticulous sterile technique used throughout (CHG prep, sterile gloves, sterile drape). Negative paresthesia. Negative blood return. Positive free-flowing CSF. Expiration date of kit checked and confirmed. Patient tolerated procedure well, without complications. Attempt x 1

## 2023-12-17 NOTE — Op Note (Signed)
OPERATIVE NOTE  DATE OF SURGERY:  12/17/2023  PATIENT NAME:  SIMAR POTHIER   DOB: [birth year]  MRN: [id number]  PRE-OPERATIVE DIAGNOSIS: Degenerative arthrosis of the left knee, primary  POST-OPERATIVE DIAGNOSIS:  Same  PROCEDURE:  Left total knee arthroplasty using computer-assisted navigation  SURGEON:  Jena Gauss. M.D.  ASSISTANT:  Gean Birchwood, PA-C (present and scrubbed throughout the case, critical for assistance with exposure, retraction, instrumentation, and closure)  ANESTHESIA: spinal  ESTIMATED BLOOD LOSS: 50 mL  FLUIDS REPLACED: 600 mL of crystalloid  TOURNIQUET TIME: 82 minutes  DRAINS: 2 medium Hemovac drains  SOFT TISSUE RELEASES: Anterior cruciate ligament, posterior cruciate ligament, deep medial collateral ligament, patellofemoral ligament  IMPLANTS UTILIZED: DePuy Attune size 5N posterior stabilized femoral component (cemented), size 4 rotating platform tibial component (cemented), 35 mm medialized dome patella (cemented), and an 8 mm stabilized rotating platform polyethylene insert.  INDICATIONS FOR SURGERY: KRISTIANNE ALBIN is a 83 y.o. year old female with a long history of progressive knee pain. X-rays demonstrated severe degenerative changes in tricompartmental fashion. The patient had not seen any significant improvement despite conservative nonsurgical intervention. After discussion of the risks and benefits of surgical intervention, the patient expressed understanding of the risks benefits and agree with plans for total knee arthroplasty.   The risks, benefits, and alternatives were discussed at length including but not limited to the risks of infection, bleeding, nerve injury, stiffness, blood clots, the need for revision surgery, cardiopulmonary complications, among others, and they were willing to proceed.  PROCEDURE IN DETAIL: The patient was brought into the operating room and, after adequate spinal anesthesia was achieved, a tourniquet was  placed on the patient's upper thigh. The patient's knee and leg were cleaned and prepped with alcohol and DuraPrep and draped in the usual sterile fashion. A "timeout" was performed as per usual protocol. The lower extremity was exsanguinated using an Esmarch, and the tourniquet was inflated to 300 mmHg. An anterior longitudinal incision was made followed by a standard mid vastus approach. The deep fibers of the medial collateral ligament were elevated in a subperiosteal fashion off of the medial flare of the tibia so as to maintain a continuous soft tissue sleeve. The patella was subluxed laterally and the patellofemoral ligament was incised. Inspection of the knee demonstrated severe degenerative changes with full-thickness loss of articular cartilage. Osteophytes were debrided using a rongeur. Anterior and posterior cruciate ligaments were excised. Two 4.0 mm Schanz pins were inserted in the femur and into the tibia for attachment of the array of trackers used for computer-assisted navigation. Hip center was identified using a circumduction technique. Distal landmarks were mapped using the computer. The distal femur and proximal tibia were mapped using the computer. The distal femoral cutting guide was positioned using computer-assisted navigation so as to achieve a 5 distal valgus cut. The femur was sized and it was felt that a size 5N femoral component was appropriate. A size 5 femoral cutting guide was positioned and the anterior cut was performed and verified using the computer. This was followed by completion of the posterior and chamfer cuts. Femoral cutting guide for the central box was then positioned in the center box cut was performed.  Attention was then directed to the proximal tibia. Medial and lateral menisci were excised. The extramedullary tibial cutting guide was positioned using computer-assisted navigation so as to achieve a 0 varus-valgus alignment and 3 posterior slope. The cut was  performed and verified using the computer. The proximal tibia  was sized and it was felt that a size 4 tibial tray was appropriate. Tibial and femoral trials were inserted followed by insertion of an 8 mm polyethylene insert. This allowed for excellent mediolateral soft tissue balancing both in flexion and in full extension. Finally, the patella was cut and prepared so as to accommodate a 35 mm medialized dome patella. A patella trial was placed and the knee was placed through a range of motion with excellent patellar tracking appreciated. The femoral trial was removed after debridement of posterior osteophytes. The central post-hole for the tibial component was reamed followed by insertion of a keel punch. Tibial trials were then removed. Cut surfaces of bone were irrigated with copious amounts of normal saline using pulsatile lavage and then suctioned dry. Polymethylmethacrylate cement with gentamicin was prepared in the usual fashion using a vacuum mixer. Cement was applied to the cut surface of the proximal tibia as well as along the undersurface of a size 4 rotating platform tibial component. Tibial component was positioned and impacted into place. Excess cement was removed using Personal assistant. Cement was then applied to the cut surfaces of the femur as well as along the posterior flanges of the size 5N femoral component. The femoral component was positioned and impacted into place. Excess cement was removed using Personal assistant. An 8 mm polyethylene trial was inserted and the knee was brought into full extension with steady axial compression applied. Finally, cement was applied to the backside of a 35 mm medialized dome patella and the patellar component was positioned and patellar clamp applied. Excess cement was removed using Personal assistant. After adequate curing of the cement, the tourniquet was deflated after a total tourniquet time of 82 minutes. Hemostasis was achieved using electrocautery. The knee  was irrigated with copious amounts of normal saline using pulsatile lavage followed by 450 ml of Surgiphor and then suctioned dry. 20 mL of 1.3% Exparel and 60 mL of 0.25% Marcaine in 40 mL of normal saline was injected along the posterior capsule, medial and lateral gutters, and along the arthrotomy site. An 8 mm stabilized rotating platform polyethylene insert was inserted and the knee was placed through a range of motion with excellent mediolateral soft tissue balancing appreciated and excellent patellar tracking noted. 2 medium drains were placed in the wound bed and brought out through separate stab incisions. The medial parapatellar portion of the incision was reapproximated using interrupted sutures of #1 Vicryl. Subcutaneous tissue was approximated in layers using first #0 Vicryl followed #2-0 Vicryl. The skin was approximated with skin staples. A sterile dressing was applied.  The patient tolerated the procedure well and was transported to the recovery room in stable condition.    Kahlea Cobert P. Angie Fava., M.D.

## 2023-12-17 NOTE — Plan of Care (Signed)
Problem: Activity: Goal: Ability to avoid complications of mobility impairment will improve Outcome: Progressing   Problem: Pain Management: Goal: Pain level will decrease with appropriate interventions Outcome: Progressing   Problem: Skin Integrity: Goal: Will show signs of wound healing Outcome: Progressing

## 2023-12-17 NOTE — Anesthesia Preprocedure Evaluation (Signed)
Anesthesia Evaluation  Patient identified by MRN, date of birth, ID band Patient awake    Reviewed: Allergy & Precautions, H&P , NPO status , Patient's Chart, lab work & pertinent test results, reviewed documented beta blocker date and time   History of Anesthesia Complications Negative for: history of anesthetic complications  Airway Mallampati: II  TM Distance: >3 FB Neck ROM: full    Dental  (+) Dental Advidsory Given, Caps, Poor Dentition, Missing   Pulmonary neg shortness of breath, asthma (as a child) , neg recent URI, former smoker   Pulmonary exam normal breath sounds clear to auscultation       Cardiovascular Exercise Tolerance: Good hypertension, (-) angina + CAD  (-) Past MI and (-) Cardiac Stents Normal cardiovascular exam(-) dysrhythmias (-) Valvular Problems/Murmurs Rhythm:regular Rate:Normal     Neuro/Psych  PSYCHIATRIC DISORDERS Anxiety     negative neurological ROS     GI/Hepatic negative GI ROS, Neg liver ROS,,,  Endo/Other  diabetes    Renal/GU CRFRenal disease  negative genitourinary   Musculoskeletal   Abdominal   Peds  Hematology negative hematology ROS (+)   Anesthesia Other Findings Past Medical History: No date: Allergy No date: Anxiety No date: Atherosclerosis of abdominal aorta (HCC) No date: Bilateral carotid artery disease (HCC) No date: Bilateral lower extremity edema No date: Cervicalgia No date: Childhood asthma No date: Colon polyp No date: Compression fracture of T11 vertebra (HCC) No date: Coronary artery disease involving native coronary artery of  native heart without angina pectoris     Comment:  a.) cMRI 08/28/2010: no               ischemia/infarct/scar/infiltration; b.) MV 10/20/2023: no              ischemia No date: Diastolic dysfunction     Comment:  a.) TTE 05/14/2023: EF >55%, no RWMAs, G1DD, mild MAC,               triv MR, mild TR/PR No date: Essential  hypertension No date: Frequent falls No date: Glaucoma No date: Headache disorder 09/08/2021: History of 2019 novel coronavirus disease (COVID-19) 2022: History of bilateral cataract extraction No date: Hyperlipidemia No date: Insomnia     Comment:  a.) takes trazodone PRN No date: Memory loss No date: Osteoarthritis No date: Osteopenia No date: Stage 3a chronic kidney disease (CKD) (HCC) No date: Syncope No date: Type 2 diabetes mellitus with complication (HCC)   Reproductive/Obstetrics negative OB ROS                             Anesthesia Physical Anesthesia Plan  ASA: 3  Anesthesia Plan: Spinal   Post-op Pain Management:    Induction: Intravenous  PONV Risk Score and Plan: 2 and Propofol infusion, TIVA and Treatment may vary due to age or medical condition  Airway Management Planned: Natural Airway and Simple Face Mask  Additional Equipment:   Intra-op Plan:   Post-operative Plan:   Informed Consent: I have reviewed the patients History and Physical, chart, labs and discussed the procedure including the risks, benefits and alternatives for the proposed anesthesia with the patient or authorized representative who has indicated his/her understanding and acceptance.     Dental Advisory Given  Plan Discussed with: Anesthesiologist, CRNA and Surgeon  Anesthesia Plan Comments:        Anesthesia Quick Evaluation

## 2023-12-17 NOTE — Progress Notes (Signed)
Subjective: 1 Day Post-Op Procedure(s) (LRB): COMPUTER ASSISTED TOTAL KNEE ARTHROPLASTY (Left) Patient reports pain as mild.   Patient seen in rounds with Dr. Ernest Pine. Patient is well, and has had no acute complaints or problems.  Denies any CP, SOB, N/B, fevers or chills. We will start therapy today.  Plan is to go Home after hospital stay.  Objective: Vital signs in last 24 hours: Temp:  [97 F (36.1 C)-98 F (36.7 C)] 97.9 F (36.6 C) (02/13 0717) Pulse Rate:  [61-80] 73 (02/13 0717) Resp:  [11-21] 15 (02/13 0717) BP: (93-178)/(51-82) 113/55 (02/13 0717) SpO2:  [99 %-100 %] 100 % (02/13 0717)  Intake/Output from previous day:  Intake/Output Summary (Last 24 hours) at 12/18/2023 0830 Last data filed at 12/18/2023 1914 Gross per 24 hour  Intake 1775.97 ml  Output 1160 ml  Net 615.97 ml    Intake/Output this shift: Total I/O In: -  Out: 10 [Drains:10]  Labs: No results for input(s): "HGB" in the last 72 hours. No results for input(s): "WBC", "RBC", "HCT", "PLT" in the last 72 hours. No results for input(s): "NA", "K", "CL", "CO2", "BUN", "CREATININE", "GLUCOSE", "CALCIUM" in the last 72 hours. No results for input(s): "LABPT", "INR" in the last 72 hours.  EXAM General - Patient is Alert, Appropriate, and Oriented Extremity - Neurologically intact ABD soft Neurovascular intact Sensation intact distally Intact pulses distally Dorsiflexion/Plantar flexion intact No cellulitis present Compartment soft Dressing - dressing C/D/I and no drainage Motor Function - intact, moving foot and toes well on exam.  Patient is able to plantar and dorsiflex with good strength and range of motion.  Neurovascular intact all dermatomes extending down her left lower extremity.  Posterior tibial pulses appreciated. JP Drain pulled without difficulty. Intact  Past Medical History:  Diagnosis Date   Allergy    Anxiety    Atherosclerosis of abdominal aorta (HCC)    Bilateral carotid  artery disease (HCC)    Bilateral lower extremity edema    Cervicalgia    Childhood asthma    Colon polyp    Compression fracture of T11 vertebra (HCC)    Coronary artery disease involving native coronary artery of native heart without angina pectoris    a.) cMRI 08/28/2010: no ischemia/infarct/scar/infiltration; b.) MV 10/20/2023: no ischemia   Diastolic dysfunction    a.) TTE 05/14/2023: EF >55%, no RWMAs, G1DD, mild MAC, triv MR, mild TR/PR   Essential hypertension    Frequent falls    Glaucoma    Headache disorder    History of 2019 novel coronavirus disease (COVID-19) 09/08/2021   History of bilateral cataract extraction 2022   Hyperlipidemia    Insomnia    a.) takes trazodone PRN   Memory loss    Osteoarthritis    Osteopenia    Stage 3a chronic kidney disease (CKD) (HCC)    Syncope    Type 2 diabetes mellitus with complication (HCC)     Assessment/Plan: 1 Day Post-Op Procedure(s) (LRB): COMPUTER ASSISTED TOTAL KNEE ARTHROPLASTY (Left) Principal Problem:   History of total knee arthroplasty, left  Estimated body mass index is 27.17 kg/m as calculated from the following:   Height as of 12/11/23: 5\' 9"  (1.753 m).   Weight as of 12/11/23: 83.5 kg. Advance diet Up with therapy  Patient will continue to work with physical therapy to pass postoperative PT protocols, ROM and strengthening  Discussed with the patient continuing to utilize Polar Care  Patient will use bone foam in 20-30 minute intervals  Patient will  wear TED hose bilaterally to help prevent DVT and clot formation  Discussed the Aquacel bandage.  This bandage will stay in place 7 days postoperatively.  Can be replaced with honeycomb bandages that will be sent home with the patient  Discussed sending the patient home with tramadol and oxycodone for as needed pain management.  Patient will also be sent home with Celebrex to help with swelling and inflammation.  Patient will take an 81 mg aspirin twice daily  for DVT prophylaxis  JP drain removed without difficulty, intact  Weight-Bearing as tolerated to left leg  Patient will follow-up with [facility] in 2 weeks for staple removal and reevaluation  Rayburn Go, PA-C [facility] 12/18/2023, 8:30 AM

## 2023-12-17 NOTE — Progress Notes (Signed)
PT Cancellation Note  Patient Details Name: Cathy Murphy MRN: [id number] DOB: 1941/03/02   Cancelled Treatment:    Reason Eval/Treat Not Completed: Patient not medically ready.  Per nursing pt currently presenting without return of LE sensation and/or strength and is not yet appropriate to participate with PT services. Will attempt to see pt at a future date/time as medically appropriate.    Ovidio Hanger PT, DPT 12/17/23, 4:21 PM

## 2023-12-17 NOTE — Discharge Summary (Signed)
Physician Discharge Summary  Subjective: 1 Day Post-Op Procedure(s) (LRB): COMPUTER ASSISTED TOTAL KNEE ARTHROPLASTY (Left) Patient reports pain as mild.   Patient seen in rounds with Dr. Ernest Pine. Patient is well, and has had no acute complaints or problems.  Denies any CP, SOB, N/B, fevers or chills. We will start therapy today.  Patient is ready to go home  Physician Discharge Summary  Patient ID: Cathy Murphy MRN: [id number] DOB/AGE: Apr 22, 1941 83 y.o.  Admit date: 12/17/2023 Discharge date: 12/18/2023  Admission Diagnoses:  Discharge Diagnoses:  Principal Problem:   History of total knee arthroplasty, left   Discharged Condition: good  Hospital Course: Patient presented to the hospital on 12/17/2023 for an elective left total knee arthroplasty performed by Dr. Ernest Pine. Patient was given 1g of TXA and 2g of Ancef prior to the procedure. she tolerated the procedure well without any complications. See procedural note below for details. Postoperatively, the patient did very well. she was able to pass PT protocols on post-op day one without any issues. JP drain was removed without any difficulty and was intact. she was able to void her bladder without any difficulty. Physical exam was unremarkable. she denies any SOB, CP, N/V, fevers or chills. Vital signs are stable. Patient is stable to discharge home.  PROCEDURE:  Left total knee arthroplasty using computer-assisted navigation   SURGEON:  Jena Gauss. M.D.   ASSISTANT:  Gean Birchwood, PA-C (present and scrubbed throughout the case, critical for assistance with exposure, retraction, instrumentation, and closure)   ANESTHESIA: spinal   ESTIMATED BLOOD LOSS: 50 mL   FLUIDS REPLACED: 600 mL of crystalloid   TOURNIQUET TIME: 82 minutes   DRAINS: 2 medium Hemovac drains   SOFT TISSUE RELEASES: Anterior cruciate ligament, posterior cruciate ligament, deep medial collateral ligament, patellofemoral ligament   IMPLANTS  UTILIZED: DePuy Attune size 5N posterior stabilized femoral component (cemented), size 4 rotating platform tibial component (cemented), 35 mm medialized dome patella (cemented), and an 8 mm stabilized rotating platform polyethylene insert.    Treatments: None  Discharge Exam: Blood pressure (!) 113/55, pulse 73, temperature 97.9 F (36.6 C), temperature source Oral, resp. rate 15, SpO2 100%.   Disposition: Home   Allergies as of 12/18/2023       Reactions   Farxiga [dapagliflozin] Itching   Genital yeast infection   Alprazolam Nausea Only   Amoxicillin Diarrhea   IgE = 290 (WNL) on 12/11/2023   Aricept [donepezil] Other (See Comments)   Leg cramps   Aspirin Nausea And Vomiting   Metformin And Related Diarrhea   Riomet [metformin Hcl] Nausea And Vomiting   Sulfa Antibiotics    unknown   Hydrocodone Rash   Nabumetone Rash   Skelaxin [metaxalone] Rash        Medication List     TAKE these medications    acetaminophen 500 MG tablet Commonly known as: TYLENOL Take 1,500 mg by mouth every 6 (six) hours as needed for moderate pain (pain score 4-6) or mild pain (pain score 1-3).   aspirin 81 MG chewable tablet Chew 1 tablet (81 mg total) by mouth 2 (two) times daily.   B-D UF III MINI PEN NEEDLES 31G X 5 MM Misc Generic drug: Insulin Pen Needle USE AS DIRECTED WITH LEVEMIR   Black Elderberry 1000 MG Caps Take 1,000 mg by mouth daily.   celecoxib 200 MG capsule Commonly known as: CELEBREX Take 1 capsule (200 mg total) by mouth 2 (two) times daily.   Difluprednate 0.05 %  Emul Place 1 drop into the right eye 2 (two) times daily.   dorzolamide-timolol 2-0.5 % ophthalmic solution Commonly known as: COSOPT Place 1 drop into both eyes 2 (two) times daily.   ezetimibe 10 MG tablet Commonly known as: ZETIA TAKE 1 TABLET BY MOUTH EVERY DAY   glipiZIDE 5 MG tablet Commonly known as: GLUCOTROL TAKE 1 TABLET BY MOUTH TWICE A DAY   hydrOXYzine 25 MG tablet Commonly  known as: ATARAX TAKE 0.5-1 TABLETS (12.5-25 MG TOTAL) BY MOUTH EVERY 8 (EIGHT) HOURS AS NEEDED FOR ANXIETY.   Januvia 100 MG tablet Generic drug: sitaGLIPtin TAKE 1 TABLET BY MOUTH EVERY DAY   Lantus SoloStar 100 UNIT/ML Solostar Pen Generic drug: insulin glargine Inject 30 Units into the skin every morning only - no evening dose   lisinopril 40 MG tablet Commonly known as: ZESTRIL Take 1 tablet (40 mg total) by mouth daily.   Lumigan 0.01 % Soln Generic drug: bimatoprost Place 1 drop into the left eye at bedtime.   OneTouch Ultra test strip Generic drug: glucose blood USE TO TEST BLOOD SUGAR UP TO TWICE DAILY.   oxyCODONE 5 MG immediate release tablet Commonly known as: Oxy IR/ROXICODONE Take 1 tablet (5 mg total) by mouth every 4 (four) hours as needed for moderate pain (pain score 4-6) (pain score 4-6).   pioglitazone 15 MG tablet Commonly known as: ACTOS TAKE 1 TABLET BY MOUTH EVERY DAY   rosuvastatin 40 MG tablet Commonly known as: CRESTOR TAKE 1 TABLET BY MOUTH EVERY DAY What changed: when to take this   spironolactone 25 MG tablet Commonly known as: ALDACTONE Take 1 tablet (25 mg total) by mouth daily.   traMADol 50 MG tablet Commonly known as: ULTRAM Take 1-2 tablets (50-100 mg total) by mouth every 4 (four) hours as needed for moderate pain (pain score 4-6).   traZODone 50 MG tablet Commonly known as: DESYREL TAKE 1 TABLET BY MOUTH EVERYDAY AT BEDTIME What changed: See the new instructions.   Vitamin D 50 MCG (2000 UT) tablet Take 2,000 Units by mouth daily.               Durable Medical Equipment  (From admission, onward)           Start     Ordered   12/17/23 1516  DME Walker rolling  Once       Question:  Patient needs a walker to treat with the following condition  Answer:  Total knee replacement status   12/17/23 1515   12/17/23 1516  DME Bedside commode  Once       Comments: Patient is not able to walk the distance required to go  the bathroom, or he/she is unable to safely negotiate stairs required to access the bathroom.  A 3in1 BSC will alleviate this problem  Question:  Patient needs a bedside commode to treat with the following condition  Answer:  Total knee replacement status   12/17/23 1515            Follow-up Information     Dedra Skeens, PA-C Follow up on 12/31/2023.   Specialty: Orthopedic Surgery Why: at 1:45pm Contact information: [address] [place] [address] [phone]         Donato Heinz, MD Follow up on 02/03/2024.   Specialty: Orthopedic Surgery Why: at 2:30pm Contact information: [address] [place] [address] [phone]  Signed: Gean Birchwood 12/18/2023, 8:39 AM   Objective: Vital signs in last 24 hours: Temp:  [97 F (36.1 C)-98 F (36.7 C)] 97.9 F (36.6 C) (02/13 0717) Pulse Rate:  [61-80] 73 (02/13 0717) Resp:  [11-21] 15 (02/13 0717) BP: (93-178)/(51-82) 113/55 (02/13 0717) SpO2:  [99 %-100 %] 100 % (02/13 0717)  Intake/Output from previous day:  Intake/Output Summary (Last 24 hours) at 12/18/2023 0839 Last data filed at 12/18/2023 8295 Gross per 24 hour  Intake 1775.97 ml  Output 1160 ml  Net 615.97 ml    Intake/Output this shift: Total I/O In: -  Out: 10 [Drains:10]  Labs: No results for input(s): "HGB" in the last 72 hours. No results for input(s): "WBC", "RBC", "HCT", "PLT" in the last 72 hours. No results for input(s): "NA", "K", "CL", "CO2", "BUN", "CREATININE", "GLUCOSE", "CALCIUM" in the last 72 hours. No results for input(s): "LABPT", "INR" in the last 72 hours.  EXAM: General - Patient is Alert, Appropriate, and Oriented Extremity - Neurologically intact ABD soft Neurovascular intact Sensation intact distally Intact pulses distally Dorsiflexion/Plantar flexion intact No cellulitis present Compartment soft Dressing - dressing C/D/I and no  drainage Motor Function - intact, moving foot and toes well on exam.  Patient is able to plantar and dorsiflex with good strength and range of motion.  Neurovascular intact all dermatomes extending down her left lower extremity.  Posterior tibial pulses appreciated. JP Drain pulled without difficulty. Intact  Assessment/Plan: 1 Day Post-Op Procedure(s) (LRB): COMPUTER ASSISTED TOTAL KNEE ARTHROPLASTY (Left) Procedure(s) (LRB): COMPUTER ASSISTED TOTAL KNEE ARTHROPLASTY (Left) Past Medical History:  Diagnosis Date   Allergy    Anxiety    Atherosclerosis of abdominal aorta (HCC)    Bilateral carotid artery disease (HCC)    Bilateral lower extremity edema    Cervicalgia    Childhood asthma    Colon polyp    Compression fracture of T11 vertebra (HCC)    Coronary artery disease involving native coronary artery of native heart without angina pectoris    a.) cMRI 08/28/2010: no ischemia/infarct/scar/infiltration; b.) MV 10/20/2023: no ischemia   Diastolic dysfunction    a.) TTE 05/14/2023: EF >55%, no RWMAs, G1DD, mild MAC, triv MR, mild TR/PR   Essential hypertension    Frequent falls    Glaucoma    Headache disorder    History of 2019 novel coronavirus disease (COVID-19) 09/08/2021   History of bilateral cataract extraction 2022   Hyperlipidemia    Insomnia    a.) takes trazodone PRN   Memory loss    Osteoarthritis    Osteopenia    Stage 3a chronic kidney disease (CKD) (HCC)    Syncope    Type 2 diabetes mellitus with complication (HCC)    Principal Problem:   History of total knee arthroplasty, left  Estimated body mass index is 27.17 kg/m as calculated from the following:   Height as of 12/11/23: 5\' 9"  (1.753 m).   Weight as of 12/11/23: 83.5 kg.  Patient will continue to work with physical therapy on gait, ROM and strengthening for LLE   Discussed with the patient continuing to utilize Polar Care   Patient will use bone foam in 20-30 minute intervals   Patient will wear  TED hose bilaterally to help prevent DVT and clot formation   Discussed the Aquacel bandage.  This bandage will stay in place 7 days postoperatively.  Can be replaced with honeycomb bandages that will be sent home with the patient   Discussed sending the patient  home with tramadol and oxycodone for as needed pain management.  Patient will also be sent home with Celebrex to help with swelling and inflammation.  Patient will take an 81 mg aspirin twice daily for DVT prophylaxis   JP drain removed without difficulty, intact   Weight-Bearing as tolerated to left leg   Patient will follow-up with [facility]oped[facility] in 2 weeks for staple removal and reevaluation  Diet - Regular diet Follow up - in 2 weeks Activity - WBAT Disposition - Home Condition Upon Discharge - Good DVT Prophylaxis - Aspirin and TED hose  Danise Edge, PA-C Orthopaedic Surgery 12/18/2023, 8:39 AM

## 2023-12-17 NOTE — Interval H&P Note (Signed)
History and Physical Interval Note:  12/17/2023 11:20 AM  Cathy Murphy  has presented today for surgery, with the diagnosis of PRIMARY OSTEOARTHRITIS OF LEFT KNEE..  The various methods of treatment have been discussed with the patient and family. After consideration of risks, benefits and other options for treatment, the patient has consented to  Procedure(s): COMPUTER ASSISTED TOTAL KNEE ARTHROPLASTY (Left) as a surgical intervention.  The patient's history has been reviewed, patient examined, no change in status, stable for surgery.  I have reviewed the patient's chart and labs.  Questions were answered to the patient's satisfaction.     [name] P Yahel Fuston

## 2023-12-18 DIAGNOSIS — M1712 Unilateral primary osteoarthritis, left knee: Secondary | ICD-10-CM | POA: Diagnosis not present

## 2023-12-18 DIAGNOSIS — Z96652 Presence of left artificial knee joint: Secondary | ICD-10-CM | POA: Diagnosis not present

## 2023-12-18 DIAGNOSIS — Z7901 Long term (current) use of anticoagulants: Secondary | ICD-10-CM | POA: Diagnosis not present

## 2023-12-18 LAB — GLUCOSE, CAPILLARY: Glucose-Capillary: 174 mg/dL — ABNORMAL HIGH (ref 70–99)

## 2023-12-18 MED ORDER — CELECOXIB 200 MG PO CAPS: 200.0000 mg | ORAL_CAPSULE | Freq: Two times a day (BID) | ORAL | 1 refills | Status: DC

## 2023-12-18 MED ORDER — LISINOPRIL 20 MG PO TABS
ORAL_TABLET | ORAL | Status: AC
  Filled 2023-12-18: qty 2

## 2023-12-18 MED ORDER — ACETAMINOPHEN 10 MG/ML IV SOLN
INTRAVENOUS | Status: AC
  Filled 2023-12-18: qty 100

## 2023-12-18 MED ORDER — OXYCODONE HCL 5 MG PO TABS: 5.0000 mg | ORAL_TABLET | ORAL | 0 refills | Status: DC | PRN

## 2023-12-18 MED ORDER — TRAMADOL HCL 50 MG PO TABS: 50.0000 mg | ORAL_TABLET | ORAL | 0 refills | Status: DC | PRN

## 2023-12-18 MED ORDER — EZETIMIBE 10 MG PO TABS
ORAL_TABLET | ORAL | Status: AC
  Filled 2023-12-18: qty 1

## 2023-12-18 MED ORDER — ASPIRIN 81 MG PO CHEW: 81.0000 mg | CHEWABLE_TABLET | Freq: Two times a day (BID) | ORAL | Status: DC

## 2023-12-18 MED ORDER — LINAGLIPTIN 5 MG PO TABS
ORAL_TABLET | ORAL | Status: AC
  Filled 2023-12-18: qty 1

## 2023-12-18 NOTE — Evaluation (Signed)
Physical Therapy Evaluation Patient Details Name: Cathy Murphy MRN: [id number] DOB: [birth year] Today's Date: 12/18/2023  History of Present Illness  83 y/o female s/p L TKA 12/17/23.  Clinical Impression  Pt showed good effort with PT session and was able to perform all expected POD1 goals (exercises, ROM, gait distance, etc).  She had expected pain that was not functionally limiting and overall pt showed ability to safely go home with planned assist.  Pt will benefit from continued PT per TKA protocols.        If plan is discharge home, recommend the following: Help with stairs or ramp for entrance;Assist for transportation;A little help with bathing/dressing/bathroom   Can travel by private vehicle        Equipment Recommendations Rolling walker (2 wheels);BSC/3in1  Recommendations for Other Services       Functional Status Assessment Patient has had a recent decline in their functional status and demonstrates the ability to make significant improvements in function in a reasonable and predictable amount of time.     Precautions / Restrictions Precautions Precautions: Fall Restrictions Weight Bearing Restrictions Per Provider Order: Yes LLE Weight Bearing Per Provider Order: Weight bearing as tolerated      Mobility  Bed Mobility Overal bed mobility: Independent                  Transfers Overall transfer level: Needs assistance Equipment used: Rolling walker (2 wheels) Transfers: Sit to/from Stand Sit to Stand: Contact guard assist           General transfer comment: Pt struggled to rise from standard height bed with self selected strategy.  Cued for set up, UE use and sequencing and able to rise from bed and commode w/o physical assist    Ambulation/Gait Ambulation/Gait assistance: Supervision Gait Distance (Feet): 200 Feet Assistive device: Rolling walker (2 wheels)         General Gait Details: Expected initial hesitancy with WBing acceptance,  but never needing excessive UE use on walker and able to increase speed, cadence, confidence with increased cuing and distance  Stairs Stairs: Yes Stairs assistance: Supervision Stair Management: One rail Right, Sideways, Step to pattern Number of Stairs: 4 General stair comments: Pt was able to simulate entry into her home with single R rail with only minimal initial cuing.  She showed good confidence and did not need any physcial assist with appropriate UE use.  Wheelchair Mobility     Tilt Bed    Modified Rankin (Stroke Patients Only)       Balance Overall balance assessment: Modified Independent                                           Pertinent Vitals/Pain Pain Assessment Pain Assessment: 0-10 Pain Score: 3  Pain Location: L knee    Home Living Family/patient expects to be discharged to:: Private residence Living Arrangements: Alone Available Help at Discharge: Family;Available 24 hours/day (daughter to stay through weekend, then other family next week) Type of Home: Mobile home Home Access: Stairs to enter Entrance Stairs-Rails: Right Entrance Stairs-Number of Steps: 4   Home Layout: One level Home Equipment: None      Prior Function Prior Level of Function : Independent/Modified Independent             Mobility Comments: Reports driving, running errands, etc w/o issue ADLs Comments: independent  Extremity/Trunk Assessment   Upper Extremity Assessment Upper Extremity Assessment: Overall WFL for tasks assessed    Lower Extremity Assessment Lower Extremity Assessment: Overall WFL for tasks assessed (expected post op weakness)       Communication   Communication Communication: No apparent difficulties    Cognition Arousal: Alert Behavior During Therapy: WFL for tasks assessed/performed   PT - Cognitive impairments: No apparent impairments                                 Cueing       General Comments       Exercises Total Joint Exercises Ankle Circles/Pumps: AROM, 10 reps Quad Sets: Strengthening, 10 reps Heel Slides: Strengthening, 10 reps (with resisted leg ext) Hip ABduction/ADduction: Strengthening, 10 reps Straight Leg Raises: AROM, 10 reps Knee Flexion: PROM, 5 reps Goniometric ROM: 1-92 (AROM to 86)   Assessment/Plan    PT Assessment Patient needs continued PT services  PT Problem List Decreased range of motion;Decreased strength;Decreased balance;Pain;Decreased safety awareness;Decreased knowledge of use of DME       PT Treatment Interventions Gait training;Stair training;DME instruction;Functional mobility training;Therapeutic activities;Therapeutic exercise;Balance training;Cognitive remediation;Patient/family education    PT Goals (Current goals can be found in the Care Plan section)  Acute Rehab PT Goals Patient Stated Goal: go home PT Goal Formulation: With patient Time For Goal Achievement: 12/31/23 Potential to Achieve Goals: Good    Frequency BID     Co-evaluation               AM-PAC PT "6 Clicks" Mobility  Outcome Measure Help needed turning from your back to your side while in a flat bed without using bedrails?: None Help needed moving from lying on your back to sitting on the side of a flat bed without using bedrails?: None Help needed moving to and from a bed to a chair (including a wheelchair)?: None Help needed standing up from a chair using your arms (e.g., wheelchair or bedside chair)?: None Help needed to walk in hospital room?: None Help needed climbing 3-5 steps with a railing? : A Little 6 Click Score: 23    End of Session Equipment Utilized During Treatment: Gait belt Activity Tolerance: Patient tolerated treatment well Patient left: in chair;with call bell/phone within reach Nurse Communication: Mobility status PT Visit Diagnosis: Muscle weakness (generalized) (M62.81);Difficulty in walking, not elsewhere classified  (R26.2);Pain Pain - Right/Left: Left Pain - part of body: Knee    Time: 0823-0855 PT Time Calculation (min) (ACUTE ONLY): 32 min   Charges:   PT Evaluation $PT Eval Low Complexity: 1 Low PT Treatments $Gait Training: 8-22 mins $Therapeutic Exercise: 8-22 mins PT General Charges $$ ACUTE PT VISIT: 1 Visit         Malachi Pro, DPT 12/18/2023, 10:30 AM

## 2023-12-18 NOTE — Evaluation (Signed)
Occupational Therapy Evaluation Patient Details Name: Cathy Murphy MRN: [id number] DOB: 11-29-1940 Today's Date: 12/18/2023   History of Present Illness   83 y/o female s/p L TKA 12/17/23.     Clinical Impressions Pt was seen for OT evaluation this date. Prior to hospital admission, pt was living at home alone and reports IND at baseline for ADL/IADLs and use of SPC for mobility.  Pt presents to acute OT demonstrating little to no functional decline or impairment in ADL performance and functional transfers. She demo LB dressing to don sock with SUP, increased time/effort. Max A to don L compression sock seated in recliner. STS from recliner with CGA/SUP to RW. Pt instructed in polar care mgt, falls prevention strategies, home/routines modifications, DME/AE for LB bathing/dressing tasks, and compression stocking mgt. Handout provided. All education complete, will sign off.        If plan is discharge home, recommend the following:   A little help with walking and/or transfers;A little help with bathing/dressing/bathroom;Assistance with cooking/housework;Assist for transportation;Help with stairs or ramp for entrance     Functional Status Assessment   Patient has not had a recent decline in their functional status     Equipment Recommendations   Tub/shower bench (pt reports she has a shower chair that is too wide? HH may can assess this)     Recommendations for Other Services         Precautions/Restrictions   Precautions Precautions: Fall Restrictions Weight Bearing Restrictions Per Provider Order: Yes LLE Weight Bearing Per Provider Order: Weight bearing as tolerated     Mobility Bed Mobility               General bed mobility comments: NT pt in recliner pre/post session    Transfers Overall transfer level: Needs assistance Equipment used: Rolling walker (2 wheels) Transfers: Sit to/from Stand Sit to Stand: Contact guard assist, Supervision            General transfer comment: CGA/SUP for STS from recliner to RW      Balance Overall balance assessment: Modified Independent                                         ADL either performed or assessed with clinical judgement   ADL Overall ADL's : Needs assistance/impaired                     Lower Body Dressing: Supervision/safety Lower Body Dressing Details (indicate cue type and reason): to don L compression hose Max A, but able to don regular sock with SUP and increased time/effort                     Vision         Perception         Praxis         Pertinent Vitals/Pain Pain Assessment Pain Assessment: No/denies pain     Extremity/Trunk Assessment Upper Extremity Assessment Upper Extremity Assessment: Overall WFL for tasks assessed   Lower Extremity Assessment Lower Extremity Assessment: Overall WFL for tasks assessed (expected post op weakness)       Communication Communication Communication: No apparent difficulties   Cognition Arousal: Alert Behavior During Therapy: WFL for tasks assessed/performed Cognition: No apparent impairments  Following commands: Intact       Cueing  General Comments          Exercises Other Exercises Other Exercises: Edu provided to pt on polar care management, AE/AD and home modifications for ease with ADL performance and functional transfers.   Shoulder Instructions      Home Living Family/patient expects to be discharged to:: Private residence Living Arrangements: Alone Available Help at Discharge: Family;Available 24 hours/day (daughter to stay through weekend, then other family next week) Type of Home: Mobile home Home Access: Stairs to enter Entrance Stairs-Number of Steps: 4 Entrance Stairs-Rails: Right Home Layout: One level         Firefighter: Standard Bathroom Accessibility: No   Home Equipment: None           Prior Functioning/Environment Prior Level of Function : Independent/Modified Independent             Mobility Comments: Reports driving, running errands, etc w/o issue ADLs Comments: independent    OT Problem List:     OT Treatment/Interventions:        OT Goals(Current goals can be found in the care plan section)       OT Frequency:       Co-evaluation              AM-PAC OT "6 Clicks" Daily Activity     Outcome Measure Help from another person eating meals?: None Help from another person taking care of personal grooming?: None Help from another person toileting, which includes using toliet, bedpan, or urinal?: A Little Help from another person bathing (including washing, rinsing, drying)?: A Little Help from another person to put on and taking off regular upper body clothing?: None Help from another person to put on and taking off regular lower body clothing?: A Little 6 Click Score: 21   End of Session Equipment Utilized During Treatment: Rolling walker (2 wheels) Nurse Communication: Mobility status  Activity Tolerance: Patient tolerated treatment well Patient left: in chair;with call bell/phone within reach  OT Visit Diagnosis: Other abnormalities of gait and mobility (R26.89)                Time: 2542-7062 OT Time Calculation (min): 20 min Charges:  OT General Charges $OT Visit: 1 Visit OT Evaluation $OT Eval Moderate Complexity: 1 Mod Heyli Min, OTR/L 12/18/23, 10:35 AM  Burt Piatek E Chip Canepa 12/18/2023, 10:34 AM

## 2023-12-18 NOTE — Plan of Care (Signed)
See document

## 2023-12-18 NOTE — TOC Progression Note (Signed)
Transition of Care [facility]) - Progression Note    Patient Details  Name: Cathy Murphy MRN: [id number] Date of Birth: 06/01/1941  [facility]) CM/SW Contact  Garret Reddish, RN Phone Number: 12/18/2023, 9:12 AM  Clinical Narrative:     Chart reviewed.  Noted that patient has been pre-arranged with [facility] for Home Health PT.    I have asked Jon with [employer] to provide patient a Rolling Walker and BSC at bedside today.  I have informed staff nurse of the above information.         Expected Discharge Plan and Services         Expected Discharge Date: 12/18/23                                     Social Determinants of Health (SDOH) Interventions SDOH Screenings   Food Insecurity: No Food Insecurity (12/17/2023)  Housing: Low Risk  (12/17/2023)  Transportation Needs: No Transportation Needs (12/17/2023)  Utilities: Not At Risk (12/17/2023)  Alcohol Screen: Low Risk  (07/23/2023)  Depression (PHQ2-9): Low Risk  (07/28/2023)  Recent Concern: Depression (PHQ2-9) - High Risk (07/22/2023)  Financial Resource Strain: Low Risk  (07/23/2023)  Physical Activity: Insufficiently Active (07/23/2023)  Social Connections: Moderately Integrated (12/17/2023)  Stress: No Stress Concern Present (07/23/2023)  Tobacco Use: Medium Risk (12/17/2023)  Health Literacy: Adequate Health Literacy (07/23/2023)    Readmission Risk Interventions     No data to display

## 2023-12-18 NOTE — Plan of Care (Signed)
  Problem: Education: Goal: Ability to describe self-care measures that may prevent or decrease complications (Diabetes Survival Skills Education) will improve Outcome: Progressing   Problem: Coping: Goal: Ability to adjust to condition or change in health will improve Outcome: Progressing   Problem: Nutritional: Goal: Maintenance of adequate nutrition will improve Outcome: Progressing   Problem: Tissue Perfusion: Goal: Adequacy of tissue perfusion will improve Outcome: Progressing   Problem: Activity: Goal: Ability to avoid complications of mobility impairment will improve Outcome: Progressing

## 2023-12-18 NOTE — Anesthesia Postprocedure Evaluation (Signed)
Anesthesia Post Note  Patient: Cathy Murphy  Procedure(s) Performed: COMPUTER ASSISTED TOTAL KNEE ARTHROPLASTY (Left: Knee)  Patient location during evaluation: Nursing Unit Anesthesia Type: Spinal Level of consciousness: awake and alert and oriented Pain management: pain level controlled Vital Signs Assessment: post-procedure vital signs reviewed and stable Respiratory status: respiratory function stable Cardiovascular status: stable Postop Assessment: no headache, no backache, patient able to bend at knees, able to ambulate, adequate PO intake and no apparent nausea or vomiting Anesthetic complications: no   There were no known notable events for this encounter.   Last Vitals:  Vitals:   12/18/23 0411 12/18/23 0717  BP: (!) 124/53 (!) 113/55  Pulse: 74 73  Resp: 14 15  Temp: 36.7 C 36.6 C  SpO2: 100% 100%    Last Pain:  Vitals:   12/18/23 0717  TempSrc: Oral  PainSc: 0-No pain                 Zachary George

## 2023-12-18 NOTE — Progress Notes (Signed)
DISCHARGE NOTE:   Pt dc with IV removed and instructions given to pt and pt's daughter. Pt has both TED hose on and in place. 3 in 1 and rolling walker delivered to hospital room. Pt given 2 honeycomb dressings as well as medication scripts. Pt and her daughter express no concerns or questions at this time. Pt wheeled down to medical mall entrance by staff and transportation provided via pt's daughter.

## 2023-12-18 NOTE — Care Management Obs Status (Signed)
MEDICARE OBSERVATION STATUS NOTIFICATION   Patient Details  Name: [name] MRN: [id number] Date of Birth: March 04, 1941   Medicare Observation Status Notification Given:  Yes    Sherilyn Banker 12/18/2023, 11:37 AM

## 2023-12-19 DIAGNOSIS — I2510 Atherosclerotic heart disease of native coronary artery without angina pectoris: Secondary | ICD-10-CM | POA: Diagnosis not present

## 2023-12-19 DIAGNOSIS — Z860100 Personal history of colon polyps, unspecified: Secondary | ICD-10-CM | POA: Diagnosis not present

## 2023-12-19 DIAGNOSIS — E785 Hyperlipidemia, unspecified: Secondary | ICD-10-CM | POA: Diagnosis not present

## 2023-12-19 DIAGNOSIS — Z471 Aftercare following joint replacement surgery: Secondary | ICD-10-CM | POA: Diagnosis not present

## 2023-12-19 DIAGNOSIS — Z9181 History of falling: Secondary | ICD-10-CM | POA: Diagnosis not present

## 2023-12-19 DIAGNOSIS — Z8616 Personal history of COVID-19: Secondary | ICD-10-CM | POA: Diagnosis not present

## 2023-12-19 DIAGNOSIS — Z791 Long term (current) use of non-steroidal anti-inflammatories (NSAID): Secondary | ICD-10-CM | POA: Diagnosis not present

## 2023-12-19 DIAGNOSIS — N1831 Chronic kidney disease, stage 3a: Secondary | ICD-10-CM | POA: Diagnosis not present

## 2023-12-19 DIAGNOSIS — E1122 Type 2 diabetes mellitus with diabetic chronic kidney disease: Secondary | ICD-10-CM | POA: Diagnosis not present

## 2023-12-19 DIAGNOSIS — I700 Atherosclerosis of aorta: Secondary | ICD-10-CM | POA: Diagnosis not present

## 2023-12-19 DIAGNOSIS — I6523 Occlusion and stenosis of bilateral carotid arteries: Secondary | ICD-10-CM | POA: Diagnosis not present

## 2023-12-19 DIAGNOSIS — Z7984 Long term (current) use of oral hypoglycemic drugs: Secondary | ICD-10-CM | POA: Diagnosis not present

## 2023-12-19 DIAGNOSIS — M19019 Primary osteoarthritis, unspecified shoulder: Secondary | ICD-10-CM | POA: Diagnosis not present

## 2023-12-19 DIAGNOSIS — Z7982 Long term (current) use of aspirin: Secondary | ICD-10-CM | POA: Diagnosis not present

## 2023-12-19 DIAGNOSIS — I129 Hypertensive chronic kidney disease with stage 1 through stage 4 chronic kidney disease, or unspecified chronic kidney disease: Secondary | ICD-10-CM | POA: Diagnosis not present

## 2023-12-19 DIAGNOSIS — G4700 Insomnia, unspecified: Secondary | ICD-10-CM | POA: Diagnosis not present

## 2023-12-19 DIAGNOSIS — M8580 Other specified disorders of bone density and structure, unspecified site: Secondary | ICD-10-CM | POA: Diagnosis not present

## 2023-12-19 DIAGNOSIS — Z96652 Presence of left artificial knee joint: Secondary | ICD-10-CM | POA: Diagnosis not present

## 2023-12-19 DIAGNOSIS — J309 Allergic rhinitis, unspecified: Secondary | ICD-10-CM | POA: Diagnosis not present

## 2023-12-19 DIAGNOSIS — H409 Unspecified glaucoma: Secondary | ICD-10-CM | POA: Diagnosis not present

## 2023-12-23 ENCOUNTER — Other Ambulatory Visit: Payer: Self-pay | Admitting: Internal Medicine

## 2023-12-23 DIAGNOSIS — Z7984 Long term (current) use of oral hypoglycemic drugs: Secondary | ICD-10-CM | POA: Diagnosis not present

## 2023-12-23 DIAGNOSIS — Z9181 History of falling: Secondary | ICD-10-CM | POA: Diagnosis not present

## 2023-12-23 DIAGNOSIS — I700 Atherosclerosis of aorta: Secondary | ICD-10-CM | POA: Diagnosis not present

## 2023-12-23 DIAGNOSIS — Z471 Aftercare following joint replacement surgery: Secondary | ICD-10-CM | POA: Diagnosis not present

## 2023-12-23 DIAGNOSIS — Z791 Long term (current) use of non-steroidal anti-inflammatories (NSAID): Secondary | ICD-10-CM | POA: Diagnosis not present

## 2023-12-23 DIAGNOSIS — Z7982 Long term (current) use of aspirin: Secondary | ICD-10-CM | POA: Diagnosis not present

## 2023-12-23 DIAGNOSIS — J309 Allergic rhinitis, unspecified: Secondary | ICD-10-CM | POA: Diagnosis not present

## 2023-12-23 DIAGNOSIS — Z860100 Personal history of colon polyps, unspecified: Secondary | ICD-10-CM | POA: Diagnosis not present

## 2023-12-23 DIAGNOSIS — Z8616 Personal history of COVID-19: Secondary | ICD-10-CM | POA: Diagnosis not present

## 2023-12-23 DIAGNOSIS — E785 Hyperlipidemia, unspecified: Secondary | ICD-10-CM | POA: Diagnosis not present

## 2023-12-23 DIAGNOSIS — N1831 Chronic kidney disease, stage 3a: Secondary | ICD-10-CM | POA: Diagnosis not present

## 2023-12-23 DIAGNOSIS — Z96652 Presence of left artificial knee joint: Secondary | ICD-10-CM | POA: Diagnosis not present

## 2023-12-23 DIAGNOSIS — G4700 Insomnia, unspecified: Secondary | ICD-10-CM | POA: Diagnosis not present

## 2023-12-23 DIAGNOSIS — M19019 Primary osteoarthritis, unspecified shoulder: Secondary | ICD-10-CM | POA: Diagnosis not present

## 2023-12-23 DIAGNOSIS — E119 Type 2 diabetes mellitus without complications: Secondary | ICD-10-CM

## 2023-12-23 DIAGNOSIS — I6523 Occlusion and stenosis of bilateral carotid arteries: Secondary | ICD-10-CM | POA: Diagnosis not present

## 2023-12-23 DIAGNOSIS — E1122 Type 2 diabetes mellitus with diabetic chronic kidney disease: Secondary | ICD-10-CM | POA: Diagnosis not present

## 2023-12-23 DIAGNOSIS — M8580 Other specified disorders of bone density and structure, unspecified site: Secondary | ICD-10-CM | POA: Diagnosis not present

## 2023-12-23 DIAGNOSIS — H409 Unspecified glaucoma: Secondary | ICD-10-CM | POA: Diagnosis not present

## 2023-12-23 DIAGNOSIS — I2510 Atherosclerotic heart disease of native coronary artery without angina pectoris: Secondary | ICD-10-CM | POA: Diagnosis not present

## 2023-12-23 DIAGNOSIS — I129 Hypertensive chronic kidney disease with stage 1 through stage 4 chronic kidney disease, or unspecified chronic kidney disease: Secondary | ICD-10-CM | POA: Diagnosis not present

## 2023-12-24 NOTE — Telephone Encounter (Signed)
Request too soon for refill.  Requested Prescriptions  Pending Prescriptions Disp Refills   pioglitazone (ACTOS) 15 MG tablet [Pharmacy Med Name: PIOGLITAZONE HCL 15 MG TABLET] 90 tablet 0    Sig: TAKE 1 TABLET BY MOUTH EVERY DAY     Endocrinology:  Diabetes - Glitazones - pioglitazone Passed - 12/24/2023  1:20 PM      Passed - HBA1C is between 0 and 7.9 and within 180 days    Hemoglobin A1C  Date Value Ref Range Status  06/25/2022 6.4  Final   Hgb A1c MFr Bld  Date Value Ref Range Status  12/11/2023 6.8 (H) 4.8 - 5.6 % Final    Comment:    (NOTE) Pre diabetes:          5.7%-6.4%  Diabetes:              >6.4%  Glycemic control for   <7.0% adults with diabetes          Passed - Valid encounter within last 6 months    Recent Outpatient Visits           5 months ago Type II diabetes mellitus with complication [facility])   [place] [facility] at [facility], Nyoka Cowden, MD   9 months ago Type II diabetes mellitus with complication [facility])   [place] [facility] at [facility], Nyoka Cowden, MD   1 year ago Type II diabetes mellitus with complication [facility])   [place] [facility] at [facility], Nyoka Cowden, MD   1 year ago Primary osteoarthritis of right knee   [place] [facility] at [facility], Nyoka Cowden, MD   1 year ago Essential (primary) hypertension   [place] [facility] Medicine at [facility], Nyoka Cowden, MD               JANUVIA 100 MG tablet [Pharmacy Med Name: JANUVIA 100 MG TABLET] 90 tablet 0    Sig: TAKE 1 TABLET BY MOUTH EVERY DAY     Endocrinology:  Diabetes - DPP-4 Inhibitors Failed - 12/24/2023  1:20 PM      Failed - Cr in normal range and within 360 days    Creatinine  Date Value Ref Range Status  07/22/2012 0.96 0.60 - 1.30 mg/dL Final   Creatinine, Ser  Date Value Ref Range Status  12/11/2023  1.24 (H) 0.44 - 1.00 mg/dL Final         Passed - HBA1C is between 0 and 7.9 and within 180 days    Hemoglobin A1C  Date Value Ref Range Status  06/25/2022 6.4  Final   Hgb A1c MFr Bld  Date Value Ref Range Status  12/11/2023 6.8 (H) 4.8 - 5.6 % Final    Comment:    (NOTE) Pre diabetes:          5.7%-6.4%  Diabetes:              >6.4%  Glycemic control for   <7.0% adults with diabetes          Passed - Valid encounter within last 6 months    Recent Outpatient Visits           5 months ago Type II diabetes mellitus with complication [facility])   [place] [facility] at [facility], Nyoka Cowden, MD   9 months ago Type  II diabetes mellitus with complication [facility])   [place] [facility] Medicine at [facility], Nyoka Cowden, MD   1 year ago Type II diabetes mellitus with complication [facility])   [place] [facility] at [facility], Nyoka Cowden, MD   1 year ago Primary osteoarthritis of right knee   [facility] at [facility], Nyoka Cowden, MD   1 year ago Essential (primary) hypertension   [facility] at [facility], Nyoka Cowden, MD

## 2023-12-25 ENCOUNTER — Encounter: Payer: Self-pay | Admitting: Internal Medicine

## 2023-12-25 DIAGNOSIS — J309 Allergic rhinitis, unspecified: Secondary | ICD-10-CM | POA: Diagnosis not present

## 2023-12-25 DIAGNOSIS — E785 Hyperlipidemia, unspecified: Secondary | ICD-10-CM | POA: Diagnosis not present

## 2023-12-25 DIAGNOSIS — Z96652 Presence of left artificial knee joint: Secondary | ICD-10-CM | POA: Diagnosis not present

## 2023-12-25 DIAGNOSIS — H409 Unspecified glaucoma: Secondary | ICD-10-CM | POA: Diagnosis not present

## 2023-12-25 DIAGNOSIS — Z7982 Long term (current) use of aspirin: Secondary | ICD-10-CM | POA: Diagnosis not present

## 2023-12-25 DIAGNOSIS — E1122 Type 2 diabetes mellitus with diabetic chronic kidney disease: Secondary | ICD-10-CM | POA: Diagnosis not present

## 2023-12-25 DIAGNOSIS — I6523 Occlusion and stenosis of bilateral carotid arteries: Secondary | ICD-10-CM | POA: Diagnosis not present

## 2023-12-25 DIAGNOSIS — Z860100 Personal history of colon polyps, unspecified: Secondary | ICD-10-CM | POA: Diagnosis not present

## 2023-12-25 DIAGNOSIS — I700 Atherosclerosis of aorta: Secondary | ICD-10-CM | POA: Diagnosis not present

## 2023-12-25 DIAGNOSIS — M8580 Other specified disorders of bone density and structure, unspecified site: Secondary | ICD-10-CM | POA: Diagnosis not present

## 2023-12-25 DIAGNOSIS — Z471 Aftercare following joint replacement surgery: Secondary | ICD-10-CM | POA: Diagnosis not present

## 2023-12-25 DIAGNOSIS — Z7984 Long term (current) use of oral hypoglycemic drugs: Secondary | ICD-10-CM | POA: Diagnosis not present

## 2023-12-25 DIAGNOSIS — Z9181 History of falling: Secondary | ICD-10-CM | POA: Diagnosis not present

## 2023-12-25 DIAGNOSIS — N1831 Chronic kidney disease, stage 3a: Secondary | ICD-10-CM | POA: Diagnosis not present

## 2023-12-25 DIAGNOSIS — I129 Hypertensive chronic kidney disease with stage 1 through stage 4 chronic kidney disease, or unspecified chronic kidney disease: Secondary | ICD-10-CM | POA: Diagnosis not present

## 2023-12-25 DIAGNOSIS — G4700 Insomnia, unspecified: Secondary | ICD-10-CM | POA: Diagnosis not present

## 2023-12-25 DIAGNOSIS — M19019 Primary osteoarthritis, unspecified shoulder: Secondary | ICD-10-CM | POA: Diagnosis not present

## 2023-12-25 DIAGNOSIS — Z8616 Personal history of COVID-19: Secondary | ICD-10-CM | POA: Diagnosis not present

## 2023-12-25 DIAGNOSIS — I2510 Atherosclerotic heart disease of native coronary artery without angina pectoris: Secondary | ICD-10-CM | POA: Diagnosis not present

## 2023-12-25 DIAGNOSIS — Z791 Long term (current) use of non-steroidal anti-inflammatories (NSAID): Secondary | ICD-10-CM | POA: Diagnosis not present

## 2023-12-26 DIAGNOSIS — I6523 Occlusion and stenosis of bilateral carotid arteries: Secondary | ICD-10-CM | POA: Diagnosis not present

## 2023-12-26 DIAGNOSIS — M8580 Other specified disorders of bone density and structure, unspecified site: Secondary | ICD-10-CM | POA: Diagnosis not present

## 2023-12-26 DIAGNOSIS — Z860100 Personal history of colon polyps, unspecified: Secondary | ICD-10-CM | POA: Diagnosis not present

## 2023-12-26 DIAGNOSIS — Z9181 History of falling: Secondary | ICD-10-CM | POA: Diagnosis not present

## 2023-12-26 DIAGNOSIS — G4700 Insomnia, unspecified: Secondary | ICD-10-CM | POA: Diagnosis not present

## 2023-12-26 DIAGNOSIS — E1122 Type 2 diabetes mellitus with diabetic chronic kidney disease: Secondary | ICD-10-CM | POA: Diagnosis not present

## 2023-12-26 DIAGNOSIS — M19019 Primary osteoarthritis, unspecified shoulder: Secondary | ICD-10-CM | POA: Diagnosis not present

## 2023-12-26 DIAGNOSIS — Z96652 Presence of left artificial knee joint: Secondary | ICD-10-CM | POA: Diagnosis not present

## 2023-12-26 DIAGNOSIS — Z7982 Long term (current) use of aspirin: Secondary | ICD-10-CM | POA: Diagnosis not present

## 2023-12-26 DIAGNOSIS — I129 Hypertensive chronic kidney disease with stage 1 through stage 4 chronic kidney disease, or unspecified chronic kidney disease: Secondary | ICD-10-CM | POA: Diagnosis not present

## 2023-12-26 DIAGNOSIS — N1831 Chronic kidney disease, stage 3a: Secondary | ICD-10-CM | POA: Diagnosis not present

## 2023-12-26 DIAGNOSIS — Z7984 Long term (current) use of oral hypoglycemic drugs: Secondary | ICD-10-CM | POA: Diagnosis not present

## 2023-12-26 DIAGNOSIS — Z8616 Personal history of COVID-19: Secondary | ICD-10-CM | POA: Diagnosis not present

## 2023-12-26 DIAGNOSIS — E785 Hyperlipidemia, unspecified: Secondary | ICD-10-CM | POA: Diagnosis not present

## 2023-12-26 DIAGNOSIS — Z791 Long term (current) use of non-steroidal anti-inflammatories (NSAID): Secondary | ICD-10-CM | POA: Diagnosis not present

## 2023-12-26 DIAGNOSIS — J309 Allergic rhinitis, unspecified: Secondary | ICD-10-CM | POA: Diagnosis not present

## 2023-12-26 DIAGNOSIS — I700 Atherosclerosis of aorta: Secondary | ICD-10-CM | POA: Diagnosis not present

## 2023-12-26 DIAGNOSIS — Z471 Aftercare following joint replacement surgery: Secondary | ICD-10-CM | POA: Diagnosis not present

## 2023-12-26 DIAGNOSIS — I2510 Atherosclerotic heart disease of native coronary artery without angina pectoris: Secondary | ICD-10-CM | POA: Diagnosis not present

## 2023-12-26 DIAGNOSIS — H409 Unspecified glaucoma: Secondary | ICD-10-CM | POA: Diagnosis not present

## 2023-12-29 ENCOUNTER — Other Ambulatory Visit: Payer: Self-pay | Admitting: Internal Medicine

## 2023-12-29 DIAGNOSIS — E1169 Type 2 diabetes mellitus with other specified complication: Secondary | ICD-10-CM

## 2023-12-29 DIAGNOSIS — Z794 Long term (current) use of insulin: Secondary | ICD-10-CM

## 2023-12-30 DIAGNOSIS — Z7982 Long term (current) use of aspirin: Secondary | ICD-10-CM | POA: Diagnosis not present

## 2023-12-30 DIAGNOSIS — Z860100 Personal history of colon polyps, unspecified: Secondary | ICD-10-CM | POA: Diagnosis not present

## 2023-12-30 DIAGNOSIS — N1831 Chronic kidney disease, stage 3a: Secondary | ICD-10-CM | POA: Diagnosis not present

## 2023-12-30 DIAGNOSIS — J309 Allergic rhinitis, unspecified: Secondary | ICD-10-CM | POA: Diagnosis not present

## 2023-12-30 DIAGNOSIS — I6523 Occlusion and stenosis of bilateral carotid arteries: Secondary | ICD-10-CM | POA: Diagnosis not present

## 2023-12-30 DIAGNOSIS — Z7984 Long term (current) use of oral hypoglycemic drugs: Secondary | ICD-10-CM | POA: Diagnosis not present

## 2023-12-30 DIAGNOSIS — Z8616 Personal history of COVID-19: Secondary | ICD-10-CM | POA: Diagnosis not present

## 2023-12-30 DIAGNOSIS — G4700 Insomnia, unspecified: Secondary | ICD-10-CM | POA: Diagnosis not present

## 2023-12-30 DIAGNOSIS — Z96652 Presence of left artificial knee joint: Secondary | ICD-10-CM | POA: Diagnosis not present

## 2023-12-30 DIAGNOSIS — I700 Atherosclerosis of aorta: Secondary | ICD-10-CM | POA: Diagnosis not present

## 2023-12-30 DIAGNOSIS — M19019 Primary osteoarthritis, unspecified shoulder: Secondary | ICD-10-CM | POA: Diagnosis not present

## 2023-12-30 DIAGNOSIS — Z471 Aftercare following joint replacement surgery: Secondary | ICD-10-CM | POA: Diagnosis not present

## 2023-12-30 DIAGNOSIS — I2510 Atherosclerotic heart disease of native coronary artery without angina pectoris: Secondary | ICD-10-CM | POA: Diagnosis not present

## 2023-12-30 DIAGNOSIS — I129 Hypertensive chronic kidney disease with stage 1 through stage 4 chronic kidney disease, or unspecified chronic kidney disease: Secondary | ICD-10-CM | POA: Diagnosis not present

## 2023-12-30 DIAGNOSIS — Z9181 History of falling: Secondary | ICD-10-CM | POA: Diagnosis not present

## 2023-12-30 DIAGNOSIS — E785 Hyperlipidemia, unspecified: Secondary | ICD-10-CM | POA: Diagnosis not present

## 2023-12-30 DIAGNOSIS — E1122 Type 2 diabetes mellitus with diabetic chronic kidney disease: Secondary | ICD-10-CM | POA: Diagnosis not present

## 2023-12-30 DIAGNOSIS — H409 Unspecified glaucoma: Secondary | ICD-10-CM | POA: Diagnosis not present

## 2023-12-30 DIAGNOSIS — M8580 Other specified disorders of bone density and structure, unspecified site: Secondary | ICD-10-CM | POA: Diagnosis not present

## 2023-12-30 DIAGNOSIS — Z791 Long term (current) use of non-steroidal anti-inflammatories (NSAID): Secondary | ICD-10-CM | POA: Diagnosis not present

## 2023-12-30 NOTE — Telephone Encounter (Signed)
 Requested Prescriptions  Pending Prescriptions Disp Refills   JANUVIA 100 MG tablet [Pharmacy Med Name: JANUVIA 100 MG TABLET] 90 tablet 0    Sig: TAKE 1 TABLET BY MOUTH EVERY DAY     Endocrinology:  Diabetes - DPP-4 Inhibitors Failed - 12/30/2023 11:46 AM      Failed - Cr in normal range and within 360 days    Creatinine  Date Value Ref Range Status  07/22/2012 0.96 0.60 - 1.30 mg/dL Final   Creatinine, Ser  Date Value Ref Range Status  12/11/2023 1.24 (H) 0.44 - 1.00 mg/dL Final         Passed - HBA1C is between 0 and 7.9 and within 180 days    Hemoglobin A1C  Date Value Ref Range Status  06/25/2022 6.4  Final   Hgb A1c MFr Bld  Date Value Ref Range Status  12/11/2023 6.8 (H) 4.8 - 5.6 % Final    Comment:    (NOTE) Pre diabetes:          5.7%-6.4%  Diabetes:              >6.4%  Glycemic control for   <7.0% adults with diabetes          Passed - Valid encounter within last 6 months    Recent Outpatient Visits           5 months ago Type II diabetes mellitus with complication [facility])   [place] [facility] at [facility], Nyoka Cowden, MD   9 months ago Type II diabetes mellitus with complication [facility])   [place] [facility] & Sports Medicine at [facility], Nyoka Cowden, MD   1 year ago Type II diabetes mellitus with complication [facility])   [place] [facility] Medicine at [facility], Nyoka Cowden, MD   1 year ago Primary osteoarthritis of right knee   [place] [facility] Medicine at [facility], Nyoka Cowden, MD   1 year ago Essential (primary) hypertension    Primary Care & Sports Medicine at [facility], Nyoka Cowden, MD               rosuvastatin (CRESTOR) 40 MG tablet [Pharmacy Med Name: ROSUVASTATIN CALCIUM 40 MG TAB] 90 tablet 0    Sig: TAKE 1 TABLET BY MOUTH EVERY DAY     Cardiovascular:  Antilipid - Statins 2 Failed - 12/30/2023 11:46 AM       Failed - Cr in normal range and within 360 days    Creatinine  Date Value Ref Range Status  07/22/2012 0.96 0.60 - 1.30 mg/dL Final   Creatinine, Ser  Date Value Ref Range Status  12/11/2023 1.24 (H) 0.44 - 1.00 mg/dL Final         Failed - Lipid Panel in normal range within the last 12 months    Cholesterol, Total  Date Value Ref Range Status  11/13/2022 168 100 - 199 mg/dL Final   LDL Chol Calc (NIH)  Date Value Ref Range Status  11/13/2022 86 0 - 99 mg/dL Final   HDL  Date Value Ref Range Status  11/13/2022 60 >39 mg/dL Final   Triglycerides  Date Value Ref Range Status  11/13/2022 122 0 - 149 mg/dL Final         Passed - Patient is not pregnant      Passed - Valid encounter within last 12 months    Recent  Outpatient Visits           5 months ago Type II diabetes mellitus with complication [facility])   [place] [facility] at [facility], Nyoka Cowden, MD   9 months ago Type II diabetes mellitus with complication [facility])   [place] [facility] Care [facility] Sports Medicine at [facility], Nyoka Cowden, MD   1 year ago Type II diabetes mellitus with complication [facility])   [place] Primary Care & Sports Medicine at [facility], Nyoka Cowden, MD   1 year ago Primary osteoarthritis of right knee   [place] Primary Care & Sports Medicine at [facility], Nyoka Cowden, MD   1 year ago Essential (primary) hypertension   [place] [facility] Medicine at [facility], Nyoka Cowden, MD               pioglitazone (ACTOS) 15 MG tablet [Pharmacy Med Name: PIOGLITAZONE HCL 15 MG TABLET] 90 tablet 0    Sig: TAKE 1 TABLET BY MOUTH EVERY DAY     Endocrinology:  Diabetes - Glitazones - pioglitazone Passed - 12/30/2023 11:46 AM      Passed - HBA1C is between 0 and 7.9 and within 180 days    Hemoglobin A1C  Date Value Ref Range Status  06/25/2022 6.4  Final   Hgb A1c MFr Bld  Date Value Ref Range Status   12/11/2023 6.8 (H) 4.8 - 5.6 % Final    Comment:    (NOTE) Pre diabetes:          5.7%-6.4%  Diabetes:              >6.4%  Glycemic control for   <7.0% adults with diabetes          Passed - Valid encounter within last 6 months    Recent Outpatient Visits           5 months ago Type II diabetes mellitus with complication [facility])   [place] [facility] Medicine at [facility], Nyoka Cowden, MD   9 months ago Type II diabetes mellitus with complication [facility])   [place] [facility] at [facility], Nyoka Cowden, MD   1 year ago Type II diabetes mellitus with complication [facility])   [place] [facility] Care & Sports Medicine at [facility], Nyoka Cowden, MD   1 year ago Primary osteoarthritis of right knee   [facility] at [facility], Nyoka Cowden, MD   1 year ago Essential (primary) hypertension   [facility] Primary [facility] at [facility], Nyoka Cowden, MD

## 2023-12-30 NOTE — Telephone Encounter (Signed)
 Requested medications are due for refill today.  yes  Requested medications are on the active medications list.  yes  Last refill. 10/17/2023 #90 0 rf  Future visit scheduled.   no  Notes to clinic.  Labs are expired.    Requested Prescriptions  Pending Prescriptions Disp Refills   rosuvastatin (CRESTOR) 40 MG tablet [Pharmacy Med Name: ROSUVASTATIN CALCIUM 40 MG TAB] 90 tablet 0    Sig: TAKE 1 TABLET BY MOUTH EVERY DAY     Cardiovascular:  Antilipid - Statins 2 Failed - 12/30/2023 11:47 AM      Failed - Cr in normal range and within 360 days    Creatinine  Date Value Ref Range Status  07/22/2012 0.96 0.60 - 1.30 mg/dL Final   Creatinine, Ser  Date Value Ref Range Status  12/11/2023 1.24 (H) 0.44 - 1.00 mg/dL Final         Failed - Lipid Panel in normal range within the last 12 months    Cholesterol, Total  Date Value Ref Range Status  11/13/2022 168 100 - 199 mg/dL Final   LDL Chol Calc (NIH)  Date Value Ref Range Status  11/13/2022 86 0 - 99 mg/dL Final   HDL  Date Value Ref Range Status  11/13/2022 60 >39 mg/dL Final   Triglycerides  Date Value Ref Range Status  11/13/2022 122 0 - 149 mg/dL Final         Passed - Patient is not pregnant      Passed - Valid encounter within last 12 months    Recent Outpatient Visits           5 months ago Type II diabetes mellitus with complication (HCC)   [place] [facility] Medicine at [facility], Nyoka Cowden, MD   9 months ago Type II diabetes mellitus with complication [facility])   [place] [facility] at [facility], Nyoka Cowden, MD   1 year ago Type II diabetes mellitus with complication [facility])   [place] [facility] Care & Sports Medicine at [facility], Nyoka Cowden, MD   1 year ago Primary osteoarthritis of right knee   [place] [facility] Care [facility] Sports Medicine at [facility], Nyoka Cowden, MD   1 year ago Essential (primary) hypertension    [place] [facility] at [facility], Nyoka Cowden, MD              Refused Prescriptions Disp Refills   JANUVIA 100 MG tablet [Pharmacy Med Name: JANUVIA 100 MG TABLET] 90 tablet 0    Sig: TAKE 1 TABLET BY MOUTH EVERY DAY     Endocrinology:  Diabetes - DPP-4 Inhibitors Failed - 12/30/2023 11:47 AM      Failed - Cr in normal range and within 360 days    Creatinine  Date Value Ref Range Status  07/22/2012 0.96 0.60 - 1.30 mg/dL Final   Creatinine, Ser  Date Value Ref Range Status  12/11/2023 1.24 (H) 0.44 - 1.00 mg/dL Final         Passed - HBA1C is between 0 and 7.9 and within 180 days    Hemoglobin A1C  Date Value Ref Range Status  06/25/2022 6.4  Final   Hgb A1c MFr Bld  Date Value Ref Range Status  12/11/2023 6.8 (H) 4.8 - 5.6 % Final    Comment:    (NOTE) Pre diabetes:  5.7%-6.4%  Diabetes:              >6.4%  Glycemic control for   <7.0% adults with diabetes          Passed - Valid encounter within last 6 months    Recent Outpatient Visits           5 months ago Type II diabetes mellitus with complication [facility])   [place] [facility] Medicine at [facility], Nyoka Cowden, MD   9 months ago Type II diabetes mellitus with complication [facility])   [place] [facility] Care [facility] Sports Medicine at [facility], Nyoka Cowden, MD   1 year ago Type II diabetes mellitus with complication [facility])   [place] Primary Care & Sports Medicine at [facility], Nyoka Cowden, MD   1 year ago Primary osteoarthritis of right knee   [place] Primary Care & Sports Medicine at [facility], Nyoka Cowden, MD   1 year ago Essential (primary) hypertension   [place] Primary Care [facility] Sports Medicine at [facility], Nyoka Cowden, MD               pioglitazone (ACTOS) 15 MG tablet [Pharmacy Med Name: PIOGLITAZONE HCL 15 MG TABLET] 90 tablet 0    Sig: TAKE 1 TABLET BY MOUTH EVERY DAY      Endocrinology:  Diabetes - Glitazones - pioglitazone Passed - 12/30/2023 11:47 AM      Passed - HBA1C is between 0 and 7.9 and within 180 days    Hemoglobin A1C  Date Value Ref Range Status  06/25/2022 6.4  Final   Hgb A1c MFr Bld  Date Value Ref Range Status  12/11/2023 6.8 (H) 4.8 - 5.6 % Final    Comment:    (NOTE) Pre diabetes:          5.7%-6.4%  Diabetes:              >6.4%  Glycemic control for   <7.0% adults with diabetes          Passed - Valid encounter within last 6 months    Recent Outpatient Visits           5 months ago Type II diabetes mellitus with complication [facility])   [place] [facility] at [facility], Nyoka Cowden, MD   9 months ago Type II diabetes mellitus with complication [facility])   [place] [facility] Sports Medicine at [facility], Nyoka Cowden, MD   1 year ago Type II diabetes mellitus with complication [facility])   [place]  Primary Care & Sports Medicine at [facility], Nyoka Cowden, MD   1 year ago Primary osteoarthritis of right knee   [facility] at [facility], Nyoka Cowden, MD   1 year ago Essential (primary) hypertension   [facility] at [facility], Nyoka Cowden, MD

## 2023-12-31 DIAGNOSIS — G8929 Other chronic pain: Secondary | ICD-10-CM | POA: Diagnosis not present

## 2023-12-31 DIAGNOSIS — M6281 Muscle weakness (generalized): Secondary | ICD-10-CM | POA: Diagnosis not present

## 2023-12-31 DIAGNOSIS — M25562 Pain in left knee: Secondary | ICD-10-CM | POA: Diagnosis not present

## 2023-12-31 DIAGNOSIS — M25662 Stiffness of left knee, not elsewhere classified: Secondary | ICD-10-CM | POA: Diagnosis not present

## 2023-12-31 DIAGNOSIS — Z96652 Presence of left artificial knee joint: Secondary | ICD-10-CM | POA: Diagnosis not present

## 2024-01-06 ENCOUNTER — Other Ambulatory Visit: Payer: Self-pay | Admitting: Internal Medicine

## 2024-01-06 DIAGNOSIS — M25662 Stiffness of left knee, not elsewhere classified: Secondary | ICD-10-CM | POA: Diagnosis not present

## 2024-01-06 DIAGNOSIS — M25562 Pain in left knee: Secondary | ICD-10-CM | POA: Diagnosis not present

## 2024-01-06 DIAGNOSIS — M6281 Muscle weakness (generalized): Secondary | ICD-10-CM | POA: Diagnosis not present

## 2024-01-06 DIAGNOSIS — G8929 Other chronic pain: Secondary | ICD-10-CM | POA: Diagnosis not present

## 2024-01-06 DIAGNOSIS — E118 Type 2 diabetes mellitus with unspecified complications: Secondary | ICD-10-CM

## 2024-01-06 DIAGNOSIS — Z96652 Presence of left artificial knee joint: Secondary | ICD-10-CM | POA: Diagnosis not present

## 2024-01-06 NOTE — Telephone Encounter (Signed)
 Requested medications are due for refill today.  yes  Requested medications are on the active medications list.  yes  Last refill. 10/07/2023 #180 0 rf  Future visit scheduled.   no  Notes to clinic.  Pt was to rtc 11/2023 for follow up.    Requested Prescriptions  Pending Prescriptions Disp Refills   glipiZIDE (GLUCOTROL) 5 MG tablet [Pharmacy Med Name: GLIPIZIDE 5 MG TABLET] 180 tablet 0    Sig: TAKE 1 TABLET BY MOUTH TWICE A DAY     Endocrinology:  Diabetes - Sulfonylureas Failed - 01/06/2024  5:58 PM      Failed - Cr in normal range and within 360 days    Creatinine  Date Value Ref Range Status  07/22/2012 0.96 0.60 - 1.30 mg/dL Final   Creatinine, Ser  Date Value Ref Range Status  12/11/2023 1.24 (H) 0.44 - 1.00 mg/dL Final         Passed - HBA1C is between 0 and 7.9 and within 180 days    Hemoglobin A1C  Date Value Ref Range Status  06/25/2022 6.4  Final   Hgb A1c MFr Bld  Date Value Ref Range Status  12/11/2023 6.8 (H) 4.8 - 5.6 % Final    Comment:    (NOTE) Pre diabetes:          5.7%-6.4%  Diabetes:              >6.4%  Glycemic control for   <7.0% adults with diabetes          Passed - Valid encounter within last 6 months    Recent Outpatient Visits           5 months ago Type II diabetes mellitus with complication [facility])   [place] [facility] at [facility], Nyoka Cowden, MD   9 months ago Type II diabetes mellitus with complication [facility])   [place] Primary Care & Sports Medicine at [facility], Nyoka Cowden, MD   1 year ago Type II diabetes mellitus with complication [facility])   [place] [facility] at [facility], Nyoka Cowden, MD   1 year ago Primary osteoarthritis of right knee   [facility] at [facility], Nyoka Cowden, MD   1 year ago Essential (primary) hypertension   [facility] Primary Care & Sports Medicine at [facility], Nyoka Cowden, MD

## 2024-01-06 NOTE — Telephone Encounter (Signed)
 Called pt to schedule ov. Call could not be completed as dialed.

## 2024-01-07 NOTE — Telephone Encounter (Signed)
 Med refill

## 2024-01-08 DIAGNOSIS — M25562 Pain in left knee: Secondary | ICD-10-CM | POA: Diagnosis not present

## 2024-01-08 DIAGNOSIS — M6281 Muscle weakness (generalized): Secondary | ICD-10-CM | POA: Diagnosis not present

## 2024-01-08 DIAGNOSIS — M25662 Stiffness of left knee, not elsewhere classified: Secondary | ICD-10-CM | POA: Diagnosis not present

## 2024-01-08 DIAGNOSIS — G8929 Other chronic pain: Secondary | ICD-10-CM | POA: Diagnosis not present

## 2024-01-08 DIAGNOSIS — Z96652 Presence of left artificial knee joint: Secondary | ICD-10-CM | POA: Diagnosis not present

## 2024-01-13 DIAGNOSIS — M25562 Pain in left knee: Secondary | ICD-10-CM | POA: Diagnosis not present

## 2024-01-13 DIAGNOSIS — Z96652 Presence of left artificial knee joint: Secondary | ICD-10-CM | POA: Diagnosis not present

## 2024-01-13 DIAGNOSIS — M6281 Muscle weakness (generalized): Secondary | ICD-10-CM | POA: Diagnosis not present

## 2024-01-13 DIAGNOSIS — M25662 Stiffness of left knee, not elsewhere classified: Secondary | ICD-10-CM | POA: Diagnosis not present

## 2024-01-13 DIAGNOSIS — G8929 Other chronic pain: Secondary | ICD-10-CM | POA: Diagnosis not present

## 2024-01-15 DIAGNOSIS — M25662 Stiffness of left knee, not elsewhere classified: Secondary | ICD-10-CM | POA: Diagnosis not present

## 2024-01-15 DIAGNOSIS — M25562 Pain in left knee: Secondary | ICD-10-CM | POA: Diagnosis not present

## 2024-01-15 DIAGNOSIS — M6281 Muscle weakness (generalized): Secondary | ICD-10-CM | POA: Diagnosis not present

## 2024-01-15 DIAGNOSIS — Z96652 Presence of left artificial knee joint: Secondary | ICD-10-CM | POA: Diagnosis not present

## 2024-01-15 DIAGNOSIS — G8929 Other chronic pain: Secondary | ICD-10-CM | POA: Diagnosis not present

## 2024-01-19 DIAGNOSIS — G8929 Other chronic pain: Secondary | ICD-10-CM | POA: Diagnosis not present

## 2024-01-19 DIAGNOSIS — M25662 Stiffness of left knee, not elsewhere classified: Secondary | ICD-10-CM | POA: Diagnosis not present

## 2024-01-19 DIAGNOSIS — M25562 Pain in left knee: Secondary | ICD-10-CM | POA: Diagnosis not present

## 2024-01-19 DIAGNOSIS — Z96652 Presence of left artificial knee joint: Secondary | ICD-10-CM | POA: Diagnosis not present

## 2024-01-19 DIAGNOSIS — M6281 Muscle weakness (generalized): Secondary | ICD-10-CM | POA: Diagnosis not present

## 2024-01-21 DIAGNOSIS — G8929 Other chronic pain: Secondary | ICD-10-CM | POA: Diagnosis not present

## 2024-01-21 DIAGNOSIS — Z96652 Presence of left artificial knee joint: Secondary | ICD-10-CM | POA: Diagnosis not present

## 2024-01-21 DIAGNOSIS — M25662 Stiffness of left knee, not elsewhere classified: Secondary | ICD-10-CM | POA: Diagnosis not present

## 2024-01-21 DIAGNOSIS — M25562 Pain in left knee: Secondary | ICD-10-CM | POA: Diagnosis not present

## 2024-01-21 DIAGNOSIS — M6281 Muscle weakness (generalized): Secondary | ICD-10-CM | POA: Diagnosis not present

## 2024-01-26 DIAGNOSIS — M25662 Stiffness of left knee, not elsewhere classified: Secondary | ICD-10-CM | POA: Diagnosis not present

## 2024-01-26 DIAGNOSIS — G8929 Other chronic pain: Secondary | ICD-10-CM | POA: Diagnosis not present

## 2024-01-26 DIAGNOSIS — M6281 Muscle weakness (generalized): Secondary | ICD-10-CM | POA: Diagnosis not present

## 2024-01-26 DIAGNOSIS — M25562 Pain in left knee: Secondary | ICD-10-CM | POA: Diagnosis not present

## 2024-01-26 DIAGNOSIS — Z96652 Presence of left artificial knee joint: Secondary | ICD-10-CM | POA: Diagnosis not present

## 2024-01-27 ENCOUNTER — Other Ambulatory Visit: Payer: Self-pay | Admitting: Internal Medicine

## 2024-01-27 ENCOUNTER — Telehealth: Payer: Self-pay | Admitting: Internal Medicine

## 2024-01-27 ENCOUNTER — Ambulatory Visit: Admitting: Internal Medicine

## 2024-01-27 DIAGNOSIS — E1169 Type 2 diabetes mellitus with other specified complication: Secondary | ICD-10-CM

## 2024-01-27 MED ORDER — ROSUVASTATIN CALCIUM 40 MG PO TABS: 40.0000 mg | ORAL_TABLET | Freq: Every day | ORAL | 3 refills | Status: AC

## 2024-01-27 NOTE — Telephone Encounter (Signed)
 Spoke with patient by phone.  She needs a refill on Crestor.  Also noted blood sugar drops overnight since her knee surgery.  She is getting around fairly well.  Taking insulin only in AM, Actos in AM but glipizide bid.  Will stop evening dose of glipzide and monitor.  Schedule follow up in May.

## 2024-01-28 DIAGNOSIS — G8929 Other chronic pain: Secondary | ICD-10-CM | POA: Diagnosis not present

## 2024-01-28 DIAGNOSIS — M25662 Stiffness of left knee, not elsewhere classified: Secondary | ICD-10-CM | POA: Diagnosis not present

## 2024-01-28 DIAGNOSIS — Z96652 Presence of left artificial knee joint: Secondary | ICD-10-CM | POA: Diagnosis not present

## 2024-01-28 DIAGNOSIS — M6281 Muscle weakness (generalized): Secondary | ICD-10-CM | POA: Diagnosis not present

## 2024-01-28 DIAGNOSIS — M25562 Pain in left knee: Secondary | ICD-10-CM | POA: Diagnosis not present

## 2024-02-02 DIAGNOSIS — Z96652 Presence of left artificial knee joint: Secondary | ICD-10-CM | POA: Diagnosis not present

## 2024-02-02 DIAGNOSIS — G8929 Other chronic pain: Secondary | ICD-10-CM | POA: Diagnosis not present

## 2024-02-02 DIAGNOSIS — M25662 Stiffness of left knee, not elsewhere classified: Secondary | ICD-10-CM | POA: Diagnosis not present

## 2024-02-02 DIAGNOSIS — M6281 Muscle weakness (generalized): Secondary | ICD-10-CM | POA: Diagnosis not present

## 2024-02-02 DIAGNOSIS — M25562 Pain in left knee: Secondary | ICD-10-CM | POA: Diagnosis not present

## 2024-02-03 DIAGNOSIS — Z96652 Presence of left artificial knee joint: Secondary | ICD-10-CM | POA: Diagnosis not present

## 2024-02-04 DIAGNOSIS — M25562 Pain in left knee: Secondary | ICD-10-CM | POA: Diagnosis not present

## 2024-02-04 DIAGNOSIS — G8929 Other chronic pain: Secondary | ICD-10-CM | POA: Diagnosis not present

## 2024-02-04 DIAGNOSIS — M25662 Stiffness of left knee, not elsewhere classified: Secondary | ICD-10-CM | POA: Diagnosis not present

## 2024-02-04 DIAGNOSIS — M6281 Muscle weakness (generalized): Secondary | ICD-10-CM | POA: Diagnosis not present

## 2024-02-04 DIAGNOSIS — Z96652 Presence of left artificial knee joint: Secondary | ICD-10-CM | POA: Diagnosis not present

## 2024-02-10 ENCOUNTER — Other Ambulatory Visit: Payer: Self-pay | Admitting: Internal Medicine

## 2024-02-10 DIAGNOSIS — M25562 Pain in left knee: Secondary | ICD-10-CM | POA: Diagnosis not present

## 2024-02-10 DIAGNOSIS — G8929 Other chronic pain: Secondary | ICD-10-CM | POA: Diagnosis not present

## 2024-02-10 DIAGNOSIS — Z96652 Presence of left artificial knee joint: Secondary | ICD-10-CM | POA: Diagnosis not present

## 2024-02-10 DIAGNOSIS — M25662 Stiffness of left knee, not elsewhere classified: Secondary | ICD-10-CM | POA: Diagnosis not present

## 2024-02-10 DIAGNOSIS — M6281 Muscle weakness (generalized): Secondary | ICD-10-CM | POA: Diagnosis not present

## 2024-02-11 NOTE — Telephone Encounter (Signed)
 Requested Prescriptions  Pending Prescriptions Disp Refills   JANUVIA 100 MG tablet [Pharmacy Med Name: JANUVIA 100 MG TABLET] 90 tablet 0    Sig: TAKE 1 TABLET BY MOUTH EVERY DAY     Endocrinology:  Diabetes - DPP-4 Inhibitors Failed - 02/11/2024  1:56 PM      Failed - Cr in normal range and within 360 days    Creatinine  Date Value Ref Range Status  07/22/2012 0.96 0.60 - 1.30 mg/dL Final   Creatinine, Ser  Date Value Ref Range Status  12/11/2023 1.24 (H) 0.44 - 1.00 mg/dL Final         Failed - Valid encounter within last 6 months    Recent Outpatient Visits   None            Passed - HBA1C is between 0 and 7.9 and within 180 days    Hemoglobin A1C  Date Value Ref Range Status  06/25/2022 6.4  Final   Hgb A1c MFr Bld  Date Value Ref Range Status  12/11/2023 6.8 (H) 4.8 - 5.6 % Final    Comment:    (NOTE) Pre diabetes:          5.7%-6.4%  Diabetes:              >6.4%  Glycemic control for   <7.0% adults with diabetes

## 2024-02-12 ENCOUNTER — Other Ambulatory Visit: Payer: Self-pay | Admitting: Internal Medicine

## 2024-02-12 DIAGNOSIS — M25562 Pain in left knee: Secondary | ICD-10-CM | POA: Diagnosis not present

## 2024-02-12 DIAGNOSIS — Z96652 Presence of left artificial knee joint: Secondary | ICD-10-CM | POA: Diagnosis not present

## 2024-02-12 DIAGNOSIS — G8929 Other chronic pain: Secondary | ICD-10-CM | POA: Diagnosis not present

## 2024-02-12 DIAGNOSIS — M25662 Stiffness of left knee, not elsewhere classified: Secondary | ICD-10-CM | POA: Diagnosis not present

## 2024-02-12 DIAGNOSIS — M6281 Muscle weakness (generalized): Secondary | ICD-10-CM | POA: Diagnosis not present

## 2024-02-13 NOTE — Telephone Encounter (Signed)
 Requested Prescriptions  Pending Prescriptions Disp Refills   B-D UF III MINI PEN NEEDLES 31G X 5 MM MISC [Pharmacy Med Name: BD UF MINI PEN NEEDLE 5MMX31G] 100 each 0    Sig: USE AS DIRECTED WITH LEVEMIR     Endocrinology: Diabetes - Testing Supplies Failed - 02/13/2024 11:22 AM      Failed - Valid encounter within last 12 months    Recent Outpatient Visits   None

## 2024-02-14 ENCOUNTER — Other Ambulatory Visit: Payer: Self-pay | Admitting: Internal Medicine

## 2024-02-14 DIAGNOSIS — E118 Type 2 diabetes mellitus with unspecified complications: Secondary | ICD-10-CM

## 2024-02-14 DIAGNOSIS — E119 Type 2 diabetes mellitus without complications: Secondary | ICD-10-CM

## 2024-02-14 DIAGNOSIS — F419 Anxiety disorder, unspecified: Secondary | ICD-10-CM

## 2024-02-16 DIAGNOSIS — M25562 Pain in left knee: Secondary | ICD-10-CM | POA: Diagnosis not present

## 2024-02-16 DIAGNOSIS — Z96652 Presence of left artificial knee joint: Secondary | ICD-10-CM | POA: Diagnosis not present

## 2024-02-16 NOTE — Telephone Encounter (Signed)
 Requested Prescriptions  Pending Prescriptions Disp Refills   hydrOXYzine (ATARAX) 25 MG tablet [Pharmacy Med Name: HYDROXYZINE HCL 25 MG TABLET] 270 tablet 1    Sig: TAKE 0.5-1 TABLETS (12.5-25 MG TOTAL) BY MOUTH EVERY 8 (EIGHT) HOURS AS NEEDED FOR ANXIETY.     Ear, Nose, and Throat:  Antihistamines 2 Failed - 02/16/2024  3:25 PM      Failed - Cr in normal range and within 360 days    Creatinine  Date Value Ref Range Status  07/22/2012 0.96 0.60 - 1.30 mg/dL Final   Creatinine, Ser  Date Value Ref Range Status  12/11/2023 1.24 (H) 0.44 - 1.00 mg/dL Final         Failed - Valid encounter within last 12 months    Recent Outpatient Visits   None             glipiZIDE (GLUCOTROL) 5 MG tablet [Pharmacy Med Name: GLIPIZIDE 5 MG TABLET] 180 tablet 0    Sig: TAKE 1 TABLET BY MOUTH TWICE A DAY     Endocrinology:  Diabetes - Sulfonylureas Failed - 02/16/2024  3:25 PM      Failed - Cr in normal range and within 360 days    Creatinine  Date Value Ref Range Status  07/22/2012 0.96 0.60 - 1.30 mg/dL Final   Creatinine, Ser  Date Value Ref Range Status  12/11/2023 1.24 (H) 0.44 - 1.00 mg/dL Final         Failed - Valid encounter within last 6 months    Recent Outpatient Visits   None            Passed - HBA1C is between 0 and 7.9 and within 180 days    Hemoglobin A1C  Date Value Ref Range Status  06/25/2022 6.4  Final   Hgb A1c MFr Bld  Date Value Ref Range Status  12/11/2023 6.8 (H) 4.8 - 5.6 % Final    Comment:    (NOTE) Pre diabetes:          5.7%-6.4%  Diabetes:              >6.4%  Glycemic control for   <7.0% adults with diabetes           pioglitazone (ACTOS) 15 MG tablet [Pharmacy Med Name: PIOGLITAZONE HCL 15 MG TABLET] 30 tablet 0    Sig: TAKE 1 TABLET BY MOUTH EVERY DAY     Endocrinology:  Diabetes - Glitazones - pioglitazone Failed - 02/16/2024  3:25 PM      Failed - Valid encounter within last 6 months    Recent Outpatient Visits   None             Passed - HBA1C is between 0 and 7.9 and within 180 days    Hemoglobin A1C  Date Value Ref Range Status  06/25/2022 6.4  Final   Hgb A1c MFr Bld  Date Value Ref Range Status  12/11/2023 6.8 (H) 4.8 - 5.6 % Final    Comment:    (NOTE) Pre diabetes:          5.7%-6.4%  Diabetes:              >6.4%  Glycemic control for   <7.0% adults with diabetes

## 2024-02-18 DIAGNOSIS — M25562 Pain in left knee: Secondary | ICD-10-CM | POA: Diagnosis not present

## 2024-02-18 DIAGNOSIS — Z96652 Presence of left artificial knee joint: Secondary | ICD-10-CM | POA: Diagnosis not present

## 2024-02-25 DIAGNOSIS — G8929 Other chronic pain: Secondary | ICD-10-CM | POA: Diagnosis not present

## 2024-02-25 DIAGNOSIS — M25662 Stiffness of left knee, not elsewhere classified: Secondary | ICD-10-CM | POA: Diagnosis not present

## 2024-02-25 DIAGNOSIS — Z96652 Presence of left artificial knee joint: Secondary | ICD-10-CM | POA: Diagnosis not present

## 2024-02-25 DIAGNOSIS — M25562 Pain in left knee: Secondary | ICD-10-CM | POA: Diagnosis not present

## 2024-02-25 DIAGNOSIS — M6281 Muscle weakness (generalized): Secondary | ICD-10-CM | POA: Diagnosis not present

## 2024-03-08 ENCOUNTER — Ambulatory Visit (INDEPENDENT_AMBULATORY_CARE_PROVIDER_SITE_OTHER): Admitting: Internal Medicine

## 2024-03-08 ENCOUNTER — Encounter: Payer: Self-pay | Admitting: Internal Medicine

## 2024-03-08 VITALS — BP 126/72 | HR 77 | Ht 69.0 in | Wt 182.5 lb

## 2024-03-08 DIAGNOSIS — Z7984 Long term (current) use of oral hypoglycemic drugs: Secondary | ICD-10-CM

## 2024-03-08 DIAGNOSIS — Z1231 Encounter for screening mammogram for malignant neoplasm of breast: Secondary | ICD-10-CM | POA: Diagnosis not present

## 2024-03-08 DIAGNOSIS — E1169 Type 2 diabetes mellitus with other specified complication: Secondary | ICD-10-CM | POA: Diagnosis not present

## 2024-03-08 DIAGNOSIS — E785 Hyperlipidemia, unspecified: Secondary | ICD-10-CM

## 2024-03-08 DIAGNOSIS — N1831 Chronic kidney disease, stage 3a: Secondary | ICD-10-CM | POA: Diagnosis not present

## 2024-03-08 DIAGNOSIS — E118 Type 2 diabetes mellitus with unspecified complications: Secondary | ICD-10-CM | POA: Diagnosis not present

## 2024-03-08 DIAGNOSIS — I10 Essential (primary) hypertension: Secondary | ICD-10-CM | POA: Diagnosis not present

## 2024-03-08 DIAGNOSIS — R058 Other specified cough: Secondary | ICD-10-CM | POA: Insufficient documentation

## 2024-03-08 MED ORDER — ACCU-CHEK SOFTCLIX LANCETS MISC: 12 refills | Status: DC

## 2024-03-08 NOTE — Assessment & Plan Note (Signed)
 LDL is  Lab Results  Component Value Date   LDLCALC 86 11/13/2022   Current regimen is atorvastatin .  No medication side effects noted. Goal LDL is <70 .

## 2024-03-08 NOTE — Assessment & Plan Note (Signed)
 New onset cough about one month ago She thinks it is the lisinopril  which she has been on for years. Will hold x 2 weeks - change to Losartan or Benicar if resolved.

## 2024-03-08 NOTE — Assessment & Plan Note (Signed)
 Blood sugars have been stable.  No recent hypoglycemic events requiring assistance. Currently medications are Actos , Januvia, Lantus  and glipizide . Lab Results  Component Value Date   HGBA1C 6.8 (H) 12/11/2023   Last visit no changes were made.

## 2024-03-08 NOTE — Progress Notes (Signed)
 Date:  03/08/2024   Name:  Cathy Murphy   DOB:  [birth year]   MRN:  [id number]   Chief Complaint: Hypertension, Diabetes, Foot Swelling (Patient said she is having swelling in both feet), and Cough (Dry cough, x 1 month)  Hypertension This is a chronic problem. The problem is controlled. Pertinent negatives include no chest pain, headaches, palpitations or shortness of breath. Past treatments include ACE inhibitors and diuretics. The current treatment provides significant improvement.  Diabetes She presents for her follow-up diabetic visit. She has type 2 diabetes mellitus. Her disease course has been stable. Pertinent negatives for hypoglycemia include no dizziness, headaches or nervousness/anxiousness. Pertinent negatives for diabetes include no chest pain, no fatigue and no weakness. Current diabetic treatments: Lantus , Actos , glipizide  and Januvia. She is compliant with treatment all of the time. An ACE inhibitor/angiotensin II receptor blocker is being taken. Eye exam is not current.  Hyperlipidemia This is a chronic problem. The problem is controlled. Pertinent negatives include no chest pain, myalgias or shortness of breath. Current antihyperlipidemic treatment includes statins. The current treatment provides significant improvement of lipids.  Cough This is a new problem. The current episode started 1 to 4 weeks ago. The problem has been unchanged. The problem occurs hourly. Cough characteristics: occaisonally productive of white phlegm. Pertinent negatives include no chest pain, chills, ear congestion, ear pain, headaches, myalgias, postnasal drip, rhinorrhea, sore throat, shortness of breath or wheezing. The symptoms are aggravated by lying down. She has tried OTC cough suppressant for the symptoms. The treatment provided moderate relief.    Review of Systems  Constitutional:  Negative for chills, fatigue and unexpected weight change.  HENT:  Negative for ear pain, postnasal drip,  rhinorrhea, sore throat and trouble swallowing.   Eyes:  Negative for visual disturbance.  Respiratory:  Positive for cough. Negative for chest tightness, shortness of breath and wheezing.   Cardiovascular:  Negative for chest pain, palpitations and leg swelling.  Gastrointestinal:  Negative for abdominal pain, constipation and diarrhea.  Musculoskeletal:  Positive for arthralgias (recovering well from knee surgery). Negative for myalgias.  Neurological:  Negative for dizziness, weakness, light-headedness and headaches.  Psychiatric/Behavioral:  Positive for sleep disturbance. Negative for dysphoric mood. The patient is not nervous/anxious.      Lab Results  Component Value Date   NA 138 12/11/2023   K 3.8 12/11/2023   CO2 26 12/11/2023   GLUCOSE 161 (H) 12/11/2023   BUN 19 12/11/2023   CREATININE 1.24 (H) 12/11/2023   CALCIUM  9.6 12/11/2023   EGFR 34 (L) 03/14/2023   GFRNONAA 44 (L) 12/11/2023   Lab Results  Component Value Date   CHOL 168 11/13/2022   HDL 60 11/13/2022   LDLCALC 86 11/13/2022   TRIG 122 11/13/2022   CHOLHDL 2.8 11/13/2022   Lab Results  Component Value Date   TSH 2.320 08/29/2021   Lab Results  Component Value Date   HGBA1C 6.8 (H) 12/11/2023   Lab Results  Component Value Date   WBC 6.4 12/11/2023   HGB 13.6 12/11/2023   HCT 43.5 12/11/2023   MCV 92.9 12/11/2023   PLT 205 12/11/2023   Lab Results  Component Value Date   ALT 14 12/11/2023   AST 16 12/11/2023   ALKPHOS 50 12/11/2023   BILITOT 1.5 (H) 12/11/2023   Lab Results  Component Value Date   VD25OH 12.8 (L) 06/15/2015     Patient Active Problem List   Diagnosis Date Noted   Cough due to  ACE inhibitor 03/08/2024   History of total knee arthroplasty, 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   Asymptomatic bilateral carotid artery stenosis 11/19/2023   Vascular calcification 07/22/2023   Syncope 12/06/2021   Difficulty sleeping 12/29/2020   Mild cognitive impairment 12/29/2020   Headache disorder  12/29/2020   Stage 3a chronic kidney disease (HCC) 08/16/2020   Osteopenia determined by x-ray 08/05/2019   Atherosclerosis of abdominal aorta (HCC) 06/09/2019   Coronary artery disease involving native coronary artery of native heart 06/09/2019   BMI 24.0-24.9, adult 11/06/2018   Bilateral neck pain 10/08/2018   Aortic calcification (HCC) 03/05/2018   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   Ventral hernia without obstruction or gangrene 03/06/2016   Type II diabetes mellitus with complication (HCC) 11/09/2015   Plantar fasciitis, right 06/16/2015   Knee torn cartilage 04/26/2015   Allergic rhinitis, seasonal 04/26/2015   Essential (primary) hypertension 04/26/2015   Glaucoma 04/26/2015   Hyperlipidemia associated with type 2 diabetes mellitus (HCC) 04/26/2015   Insomnia 04/26/2015   Primary osteoarthritis of left knee 05/03/2014    Allergies  Allergen Reactions   Farxiga [Dapagliflozin] Itching    Genital yeast infection   Alprazolam Nausea Only   Amoxicillin Diarrhea    IgE = 290 (WNL) on 12/11/2023   Aricept [Donepezil] Other (See Comments)    Leg cramps   Aspirin  Nausea And Vomiting   Metformin And Related Diarrhea   Riomet [Metformin Hcl] Nausea And Vomiting   Hydrocodone  Rash   Nabumetone Rash   Skelaxin [Metaxalone] Rash   Sulfa Antibiotics Other (See Comments)    unknown  Does not remember    Past Surgical History:  Procedure Laterality Date   CATARACT EXTRACTION W/PHACO Left 10/01/2021   Procedure: CATARACT EXTRACTION PHACO AND INTRAOCULAR LENS PLACEMENT (IOC) LEFT HYDRUS MICROSTENT DIABETIC;  Surgeon: Rosa College, MD;  Location: [facility];  Service: Ophthalmology;  Laterality: Left;  Diabetic 7.49 00:42.1   CATARACT EXTRACTION W/PHACO Right 10/15/2021   Procedure: PHACO EMULSIFICATION  with HYDRUS MICROSTENT iMPLANTATION;  Surgeon: Rosa College, MD;  Location: [facility];  Service: Ophthalmology;  Laterality: Right;   Diabetic 4.98 00:27.3   COLONOSCOPY WITH PROPOFOL  N/A 11/25/2017   Procedure: COLONOSCOPY WITH PROPOFOL ;  Surgeon: Deveron Fly, MD;  Location: [facility];  Service: Endoscopy;  Laterality: N/A;   ECTOPIC PREGNANCY SURGERY     KNEE ARTHROPLASTY Left 12/17/2023   Procedure: COMPUTER ASSISTED TOTAL KNEE ARTHROPLASTY;  Surgeon: Arlyne Lame, MD;  Location: [facility];  Service: Orthopedics;  Laterality: Left;   LIPOMA RESECTION     right posterior neck   TOTAL ABDOMINAL HYSTERECTOMY W/ BILATERAL SALPINGOOPHORECTOMY      Social History   Tobacco Use   Smoking status: Former    Current packs/day: 0.00    Average packs/day: 1 pack/day for 7.0 years (7.0 ttl pk-yrs)    Types: Cigarettes    Start date: 32    Quit date: 1965    Years since quitting: 60.3   Smokeless tobacco: Never   Tobacco comments:    smoking cessation materials not required  Vaping Use   Vaping status: Never Used  Substance Use Topics   Alcohol use: No   Drug use: No     Medication list has been reviewed and updated.  Current Meds  Medication Sig   Accu-Chek Softclix Lancets lancets Use as instructed   acetaminophen  (TYLENOL ) 500 MG tablet Take 1,500 mg by mouth every 6 (six) hours as needed for moderate pain (pain score 4-6) or  mild pain (pain score 1-3).   B-D UF III MINI PEN NEEDLES 31G X 5 MM MISC USE AS DIRECTED WITH LEVEMIR    bimatoprost (LUMIGAN) 0.01 % SOLN Place 1 drop into the left eye at bedtime.   Black Elderberry 1000 MG CAPS Take 1,000 mg by mouth daily.   Cholecalciferol (VITAMIN D ) 50 MCG (2000 UT) tablet Take 2,000 Units by mouth daily.   Difluprednate  0.05 % EMUL Place 1 drop into the right eye 2 (two) times daily.   dorzolamide -timolol  (COSOPT ) 2-0.5 % ophthalmic solution Place 1 drop into both eyes 2 (two) times daily.   ezetimibe  (ZETIA ) 10 MG tablet TAKE 1 TABLET BY MOUTH EVERY DAY   glipiZIDE  (GLUCOTROL ) 5 MG tablet TAKE 1 TABLET BY MOUTH TWICE A DAY (Patient taking  differently: Take 5 mg by mouth daily.)   glucose blood (ONETOUCH ULTRA) test strip USE TO TEST BLOOD SUGAR UP TO TWICE DAILY.   hydrOXYzine  (ATARAX ) 25 MG tablet TAKE 0.5-1 TABLETS (12.5-25 MG TOTAL) BY MOUTH EVERY 8 (EIGHT) HOURS AS NEEDED FOR ANXIETY.   insulin  glargine (LANTUS  SOLOSTAR) 100 UNIT/ML Solostar Pen Inject 30 Units into the skin every morning only - no evening dose   JANUVIA 100 MG tablet TAKE 1 TABLET BY MOUTH EVERY DAY   lisinopril  (ZESTRIL ) 40 MG tablet Take 1 tablet (40 mg total) by mouth daily.   oxyCODONE  (OXY IR/ROXICODONE ) 5 MG immediate release tablet Take 1 tablet (5 mg total) by mouth every 4 (four) hours as needed for moderate pain (pain score 4-6) (pain score 4-6).   pioglitazone  (ACTOS ) 15 MG tablet TAKE 1 TABLET BY MOUTH EVERY DAY   rosuvastatin  (CRESTOR ) 40 MG tablet Take 1 tablet (40 mg total) by mouth at bedtime.   spironolactone  (ALDACTONE ) 25 MG tablet Take 1 tablet (25 mg total) by mouth daily.   traZODone  (DESYREL ) 50 MG tablet TAKE 1 TABLET BY MOUTH EVERYDAY AT BEDTIME (Patient taking differently: Take 50 mg by mouth at bedtime as needed for sleep.)       03/08/2024    9:59 AM 07/22/2023   11:02 AM 03/14/2023   10:10 AM 11/13/2022    9:55 AM  GAD 7 : Generalized Anxiety Score  Nervous, Anxious, on Edge 1 2 1  0  Control/stop worrying 0 2 1 0  Worry too much - different things 1 2 1  0  Trouble relaxing 1 3 1  0  Restless 1 3 1  0  Easily annoyed or irritable 1 2 0 0  Afraid - awful might happen 1 2 0 0  Total GAD 7 Score 6 16 5  0  Anxiety Difficulty Not difficult at all Not difficult at all Not difficult at all Not difficult at all       03/08/2024    9:59 AM 07/28/2023   10:16 AM 07/23/2023   11:30 AM  Depression screen PHQ 2/9  Decreased Interest 1 0 0  Down, Depressed, Hopeless 1 1 0  PHQ - 2 Score 2 1 0  Altered sleeping 0 1 0  Tired, decreased energy 1 1 0  Change in appetite 0 0 0  Feeling bad or failure about yourself  0 0 0  Trouble  concentrating 1 0 0  Moving slowly or fidgety/restless 0 0 0  Suicidal thoughts 0 0 0  PHQ-9 Score 4 3 0  Difficult doing work/chores Not difficult at all  Not difficult at all    BP Readings from Last 3 Encounters:  03/08/24 126/72  12/18/23 (!) 134/55  07/22/23 128/64    Physical Exam Vitals and nursing note reviewed.  Constitutional:      General: She is not in acute distress.    Appearance: Normal appearance. She is well-developed.  HENT:     Head: Normocephalic and atraumatic.     Right Ear: Tympanic membrane and ear canal normal.     Left Ear: Tympanic membrane and ear canal normal.     Nose:     Right Sinus: No maxillary sinus tenderness.     Left Sinus: No maxillary sinus tenderness.  Eyes:     General: No scleral icterus.       Right eye: No discharge.        Left eye: No discharge.     Conjunctiva/sclera: Conjunctivae normal.  Neck:     Thyroid : No thyromegaly.     Vascular: No carotid bruit.  Cardiovascular:     Rate and Rhythm: Normal rate and regular rhythm.     Pulses: Normal pulses.     Heart sounds: Normal heart sounds.  Pulmonary:     Effort: Pulmonary effort is normal. No respiratory distress.     Breath sounds: Normal breath sounds. No decreased breath sounds, wheezing or rhonchi.  Abdominal:     General: Bowel sounds are normal.     Palpations: Abdomen is soft.     Tenderness: There is no abdominal tenderness.  Musculoskeletal:     Cervical back: Normal range of motion. No erythema.     Right lower leg: No edema.     Left lower leg: No edema.  Lymphadenopathy:     Cervical: No cervical adenopathy.  Skin:    General: Skin is warm and dry.     Findings: No rash.  Neurological:     Mental Status: She is alert and oriented to person, place, and time.     Cranial Nerves: No cranial nerve deficit.     Sensory: No sensory deficit.     Deep Tendon Reflexes: Reflexes are normal and symmetric.  Psychiatric:        Attention and Perception:  Attention normal.        Mood and Affect: Mood normal.     Wt Readings from Last 3 Encounters:  03/08/24 182 lb 8 oz (82.8 kg)  12/11/23 184 lb (83.5 kg)  07/22/23 185 lb (83.9 kg)    BP 126/72   Pulse 77   Ht 5\' 9"  (1.753 m)   Wt 182 lb 8 oz (82.8 kg)   SpO2 96%   BMI 26.95 kg/m   Assessment and Plan:  Problem List Items Addressed This Visit       Unprioritized   Essential (primary) hypertension - Primary (Chronic)   Blood pressure is well controlled.  Current medications are lisinopril  and spironolactone . Will continue efforts to limit dietary sodium. Hold lisinopril  due to cough.       Relevant Orders   CBC with Differential/Platelet   Comprehensive metabolic panel with GFR   TSH   Hyperlipidemia associated with type 2 diabetes mellitus (HCC) (Chronic)   LDL is  Lab Results  Component Value Date   LDLCALC 86 11/13/2022   Current regimen is atorvastatin .  No medication side effects noted. Goal LDL is <70 .       Relevant Orders   Lipid panel   Type II diabetes mellitus with complication (HCC) (Chronic)   Blood sugars have been stable.  No recent hypoglycemic events requiring assistance. Currently medications are Actos , Januvia, Lantus  and  glipizide . Lab Results  Component Value Date   HGBA1C 6.8 (H) 12/11/2023   Last visit no changes were made.       Relevant Medications   Accu-Chek Softclix Lancets lancets   Other Relevant Orders   Comprehensive metabolic panel with GFR   Microalbumin / creatinine urine ratio   Hemoglobin A1c   Stage 3a chronic kidney disease (HCC) (Chronic)   Relevant Orders   Comprehensive metabolic panel with GFR   Cough due to ACE inhibitor   New onset cough about one month ago She thinks it is the lisinopril  which she has been on for years. Will hold x 2 weeks - change to Losartan or Benicar if resolved.      Other Visit Diagnoses       Encounter for screening mammogram for breast cancer       Relevant Orders    MM 3D SCREENING MAMMOGRAM BILATERAL BREAST     Long term current use of oral hypoglycemic drug           Return in about 4 months (around 07/09/2024) for DM, HTN.    Sheron Dixons, MD [facility] [place]

## 2024-03-08 NOTE — Patient Instructions (Addendum)
 You can stop the lisinopril  for 2 weeks.  If the cough resolves, call me for another medication.  Call [facility] to schedule your mammogram at [phone].

## 2024-03-08 NOTE — Assessment & Plan Note (Addendum)
 Blood pressure is well controlled.  Current medications are lisinopril  and spironolactone . Will continue efforts to limit dietary sodium. Hold lisinopril  due to cough.

## 2024-03-09 LAB — TSH: TSH: 2.08 u[IU]/mL (ref 0.450–4.500)

## 2024-03-09 LAB — HEMOGLOBIN A1C
Est. average glucose Bld gHb Est-mCnc: 131 mg/dL
Hgb A1c MFr Bld: 6.2 % — ABNORMAL HIGH (ref 4.8–5.6)

## 2024-03-09 LAB — COMPREHENSIVE METABOLIC PANEL WITH GFR
ALT: 9 IU/L (ref 0–32)
AST: 13 IU/L (ref 0–40)
Albumin: 4.2 g/dL (ref 3.8–4.8)
Alkaline Phosphatase: 57 IU/L (ref 44–121)
BUN/Creatinine Ratio: 13 (ref 12–28)
BUN: 20 mg/dL (ref 8–27)
Bilirubin Total: 1 mg/dL (ref 0.0–1.2)
CO2: 22 mmol/L (ref 20–29)
Calcium: 9.7 mg/dL (ref 8.7–10.3)
Chloride: 104 mmol/L (ref 96–106)
Creatinine, Ser: 1.53 mg/dL — ABNORMAL HIGH (ref 0.57–1.00)
Globulin, Total: 2.8 g/dL (ref 1.5–4.5)
Glucose: 62 mg/dL — ABNORMAL LOW (ref 70–99)
Potassium: 4.4 mmol/L (ref 3.5–5.2)
Sodium: 143 mmol/L (ref 134–144)
Total Protein: 7 g/dL (ref 6.0–8.5)
eGFR: 34 mL/min/{1.73_m2} — ABNORMAL LOW (ref >59)

## 2024-03-09 LAB — CBC WITH DIFFERENTIAL/PLATELET
Basophils Absolute: 0.1 10*3/uL (ref 0.0–0.2)
Basos: 1 %
EOS (ABSOLUTE): 0.1 10*3/uL (ref 0.0–0.4)
Eos: 2 %
Hematocrit: 38.1 % (ref 34.0–46.6)
Hemoglobin: 12 g/dL (ref 11.1–15.9)
Immature Grans (Abs): 0 10*3/uL (ref 0.0–0.1)
Immature Granulocytes: 0 %
Lymphocytes Absolute: 1.2 10*3/uL (ref 0.7–3.1)
Lymphs: 19 %
MCH: 29.3 pg (ref 26.6–33.0)
MCHC: 31.5 g/dL (ref 31.5–35.7)
MCV: 93 fL (ref 79–97)
Monocytes Absolute: 0.5 10*3/uL (ref 0.1–0.9)
Monocytes: 8 %
Neutrophils Absolute: 4.4 10*3/uL (ref 1.4–7.0)
Neutrophils: 70 %
Platelets: 219 10*3/uL (ref 150–450)
RBC: 4.1 x10E6/uL (ref 3.77–5.28)
RDW: 12.8 % (ref 11.7–15.4)
WBC: 6.3 10*3/uL (ref 3.4–10.8)

## 2024-03-09 LAB — LIPID PANEL
Chol/HDL Ratio: 2.8 ratio (ref 0.0–4.4)
Cholesterol, Total: 152 mg/dL (ref 100–199)
HDL: 55 mg/dL (ref >39)
LDL Chol Calc (NIH): 76 mg/dL (ref 0–99)
Triglycerides: 115 mg/dL (ref 0–149)
VLDL Cholesterol Cal: 21 mg/dL (ref 5–40)

## 2024-03-09 LAB — MICROALBUMIN / CREATININE URINE RATIO
Creatinine, Urine: 214.3 mg/dL
Microalb/Creat Ratio: 97 mg/g{creat} — ABNORMAL HIGH (ref 0–29)
Microalbumin, Urine: 208 ug/mL

## 2024-03-17 DIAGNOSIS — H35351 Cystoid macular degeneration, right eye: Secondary | ICD-10-CM | POA: Diagnosis not present

## 2024-03-17 DIAGNOSIS — H40043 Steroid responder, bilateral: Secondary | ICD-10-CM | POA: Diagnosis not present

## 2024-03-17 DIAGNOSIS — E113392 Type 2 diabetes mellitus with moderate nonproliferative diabetic retinopathy without macular edema, left eye: Secondary | ICD-10-CM | POA: Diagnosis not present

## 2024-03-17 DIAGNOSIS — T8522XD Displacement of intraocular lens, subsequent encounter: Secondary | ICD-10-CM | POA: Diagnosis not present

## 2024-03-19 ENCOUNTER — Emergency Department
Admission: EM | Admit: 2024-03-19 | Discharge: 2024-03-19 | Disposition: A | Discharge status: Home / Self Care | Attending: Emergency Medicine | Admitting: Emergency Medicine

## 2024-03-19 ENCOUNTER — Emergency Department

## 2024-03-19 ENCOUNTER — Other Ambulatory Visit: Payer: Self-pay

## 2024-03-19 ENCOUNTER — Encounter: Payer: Self-pay | Admitting: Intensive Care

## 2024-03-19 ENCOUNTER — Ambulatory Visit
Admission: EM | Admit: 2024-03-19 | Discharge: 2024-03-19 | Disposition: A | Discharge status: Home / Self Care | Attending: Emergency Medicine | Admitting: Emergency Medicine

## 2024-03-19 DIAGNOSIS — H8110 Benign paroxysmal vertigo, unspecified ear: Secondary | ICD-10-CM | POA: Diagnosis not present

## 2024-03-19 DIAGNOSIS — I129 Hypertensive chronic kidney disease with stage 1 through stage 4 chronic kidney disease, or unspecified chronic kidney disease: Secondary | ICD-10-CM | POA: Diagnosis not present

## 2024-03-19 DIAGNOSIS — R4182 Altered mental status, unspecified: Secondary | ICD-10-CM | POA: Insufficient documentation

## 2024-03-19 DIAGNOSIS — H81399 Other peripheral vertigo, unspecified ear: Secondary | ICD-10-CM | POA: Diagnosis not present

## 2024-03-19 DIAGNOSIS — N189 Chronic kidney disease, unspecified: Secondary | ICD-10-CM | POA: Insufficient documentation

## 2024-03-19 DIAGNOSIS — R42 Dizziness and giddiness: Secondary | ICD-10-CM | POA: Diagnosis not present

## 2024-03-19 DIAGNOSIS — R519 Headache, unspecified: Secondary | ICD-10-CM | POA: Diagnosis not present

## 2024-03-19 DIAGNOSIS — E119 Type 2 diabetes mellitus without complications: Secondary | ICD-10-CM | POA: Diagnosis not present

## 2024-03-19 LAB — URINALYSIS, ROUTINE W REFLEX MICROSCOPIC
Bilirubin Urine: NEGATIVE
Glucose, UA: NEGATIVE mg/dL
Hgb urine dipstick: NEGATIVE
Ketones, ur: NEGATIVE mg/dL
Nitrite: NEGATIVE
Protein, ur: 30 mg/dL — AB
Specific Gravity, Urine: 1.031 — ABNORMAL HIGH (ref 1.005–1.030)
pH: 5 (ref 5.0–8.0)

## 2024-03-19 LAB — COMPREHENSIVE METABOLIC PANEL WITH GFR
ALT: 11 U/L (ref 0–44)
AST: 16 U/L (ref 15–41)
Albumin: 3.9 g/dL (ref 3.5–5.0)
Alkaline Phosphatase: 41 U/L (ref 38–126)
Anion gap: 10 (ref 5–15)
BUN: 28 mg/dL — ABNORMAL HIGH (ref 8–23)
CO2: 24 mmol/L (ref 22–32)
Calcium: 9.7 mg/dL (ref 8.9–10.3)
Chloride: 104 mmol/L (ref 98–111)
Creatinine, Ser: 1.49 mg/dL — ABNORMAL HIGH (ref 0.44–1.00)
GFR, Estimated: 35 mL/min — ABNORMAL LOW (ref >60)
Glucose, Bld: 73 mg/dL (ref 70–99)
Potassium: 4.1 mmol/L (ref 3.5–5.1)
Sodium: 138 mmol/L (ref 135–145)
Total Bilirubin: 1.4 mg/dL — ABNORMAL HIGH (ref 0.0–1.2)
Total Protein: 7.3 g/dL (ref 6.5–8.1)

## 2024-03-19 LAB — CBG MONITORING, ED: Glucose-Capillary: 63 mg/dL — ABNORMAL LOW (ref 70–99)

## 2024-03-19 LAB — CBC
HCT: 38.1 % (ref 36.0–46.0)
Hemoglobin: 11.7 g/dL — ABNORMAL LOW (ref 12.0–15.0)
MCH: 29 pg (ref 26.0–34.0)
MCHC: 30.7 g/dL (ref 30.0–36.0)
MCV: 94.3 fL (ref 80.0–100.0)
Platelets: 203 10*3/uL (ref 150–400)
RBC: 4.04 MIL/uL (ref 3.87–5.11)
RDW: 13.3 % (ref 11.5–15.5)
WBC: 8.4 10*3/uL (ref 4.0–10.5)
nRBC: 0 % (ref 0.0–0.2)

## 2024-03-19 MED ORDER — MECLIZINE HCL 25 MG PO TABS
25.0000 mg | ORAL_TABLET | Freq: Once | ORAL | Status: AC
  Administered 2024-03-19: 25 mg via ORAL
  Filled 2024-03-19: qty 1

## 2024-03-19 MED ORDER — MECLIZINE HCL 25 MG PO TABS: 25.0000 mg | ORAL_TABLET | Freq: Three times a day (TID) | ORAL | 0 refills | Status: DC | PRN

## 2024-03-19 MED ORDER — BUTALBITAL-APAP-CAFFEINE 50-325-40 MG PO TABS
2.0000 | ORAL_TABLET | Freq: Once | ORAL | Status: AC
  Administered 2024-03-19: 2 via ORAL
  Filled 2024-03-19: qty 2

## 2024-03-19 NOTE — ED Notes (Signed)
 Patient is being discharged from the Urgent Care and sent to the Emergency Department via Personal Vehicle . Per Jessee Mormon, NP, patient is in need of higher level of care due to Elevated blood pressure, Headache, and Vertigo. Patient is aware and verbalizes understanding of plan of care.  Vitals:   03/19/24 1021  BP: (!) 140/63  Pulse: 80  Temp: 98.5 F (36.9 C)  SpO2: 100%

## 2024-03-19 NOTE — ED Provider Notes (Signed)
 [facility]    CSN: [id number] Arrival date & time: 03/19/24  1006      History   Chief Complaint Chief Complaint  Patient presents with   Dizziness   Nausea    HPI Cathy Murphy is a 83 y.o. female.   HPI  83 year old female past medical history significant for hypertension, hyperlipidemia, type 2 diabetes, aortic atherosclerosis, CAD, stage III kidney disease, glaucoma, mild cognitive impairment, vertigo, and syncope presents for evaluation of dizziness and lightheadedness that has been going on for the past week.  This is also associated with nausea.  Patient reports that her symptoms are worse when she lays down and she feels like she is going to pass out so she is been having to sleep upright in her recliner.  In the last day she has developed a headache in her left temple region.  She has no history of headaches.  She denies any changes in vision, numbness, tingling, or weakness in any of her extremities.  She feels like she is off balance when she walks.  Past Medical History:  Diagnosis Date   Allergy    Anxiety    Atherosclerosis of abdominal aorta (HCC)    Bilateral carotid artery disease (HCC)    Bilateral lower extremity edema    Cervicalgia    Childhood asthma    Colon polyp    Compression fracture of T11 vertebra (HCC)    Coronary artery disease involving native coronary artery of native heart without angina pectoris    a.) cMRI 08/28/2010: no ischemia/infarct/scar/infiltration; b.) MV 10/20/2023: no ischemia   Diastolic dysfunction    a.) TTE 05/14/2023: EF >55%, no RWMAs, G1DD, mild MAC, triv MR, mild TR/PR   Essential hypertension    Frequent falls    Glaucoma    Headache disorder    History of 2019 novel coronavirus disease (COVID-19) 09/08/2021   History of bilateral cataract extraction 2022   Hyperlipidemia    Insomnia    a.) takes trazodone  PRN   Memory loss    Osteoarthritis    Osteopenia    Stage 3a chronic kidney disease (CKD)  (HCC)    Syncope    Type 2 diabetes mellitus with complication [facility])     Patient Active Problem List   Diagnosis Date Noted   Cough due to ACE inhibitor 03/08/2024   History of total knee arthroplasty, 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   Asymptomatic bilateral carotid artery stenosis 11/19/2023   Vascular calcification 07/22/2023   Syncope 12/06/2021   Difficulty sleeping 12/29/2020   Mild cognitive impairment 12/29/2020   Headache disorder 12/29/2020   Stage 3a chronic kidney disease (HCC) 08/16/2020   Osteopenia determined by x-ray 08/05/2019   Atherosclerosis of abdominal aorta (HCC) 06/09/2019   Coronary artery disease involving native coronary artery of native heart 06/09/2019   BMI 24.0-24.9, adult 11/06/2018   Bilateral neck pain 10/08/2018   Aortic calcification (HCC) 03/05/2018   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   Ventral hernia without obstruction or gangrene 03/06/2016   Type II diabetes mellitus with complication (HCC) 11/09/2015   Plantar fasciitis, right 06/16/2015   Knee torn cartilage 04/26/2015   Allergic rhinitis, seasonal 04/26/2015   Essential (primary) hypertension 04/26/2015   Glaucoma 04/26/2015   Hyperlipidemia associated with type 2 diabetes mellitus (HCC) 04/26/2015   Insomnia 04/26/2015   Primary osteoarthritis of left knee 05/03/2014    Past Surgical History:  Procedure Laterality Date   CATARACT EXTRACTION W/PHACO Left 10/01/2021   Procedure: CATARACT EXTRACTION PHACO AND  INTRAOCULAR LENS PLACEMENT (IOC) LEFT HYDRUS MICROSTENT DIABETIC;  Surgeon: Rosa College, MD;  Location: [facility];  Service: Ophthalmology;  Laterality: Left;  Diabetic 7.49 00:42.1   CATARACT EXTRACTION W/PHACO Right 10/15/2021   Procedure: PHACO EMULSIFICATION  with HYDRUS MICROSTENT iMPLANTATION;  Surgeon: Rosa College, MD;  Location: [facility];  Service: Ophthalmology;  Laterality: Right;  Diabetic 4.98 00:27.3   COLONOSCOPY WITH PROPOFOL  N/A 11/25/2017    Procedure: COLONOSCOPY WITH PROPOFOL ;  Surgeon: Deveron Fly, MD;  Location: [facility];  Service: Endoscopy;  Laterality: N/A;   ECTOPIC PREGNANCY SURGERY     KNEE ARTHROPLASTY Left 12/17/2023   Procedure: COMPUTER ASSISTED TOTAL KNEE ARTHROPLASTY;  Surgeon: Arlyne Lame, MD;  Location: [facility] ORS;  Service: Orthopedics;  Laterality: Left;   LIPOMA RESECTION     right posterior neck   TOTAL ABDOMINAL HYSTERECTOMY W/ BILATERAL SALPINGOOPHORECTOMY      OB History   No obstetric history on file.      Home Medications    Prior to Admission medications   Medication Sig Start Date End Date Taking? Authorizing Provider  Accu-Chek Softclix Lancets lancets Use as instructed 03/08/24  Yes Sheron Dixons, MD  acetaminophen  (TYLENOL ) 500 MG tablet Take 1,500 mg by mouth every 6 (six) hours as needed for moderate pain (pain score 4-6) or mild pain (pain score 1-3).   Yes [provider]  B-D UF III MINI PEN NEEDLES 31G X 5 MM MISC USE AS DIRECTED WITH LEVEMIR  02/13/24  Yes Sheron Dixons, MD  bimatoprost (LUMIGAN) 0.01 % SOLN Place 1 drop into the left eye at bedtime. 08/22/21  Yes [provider]  Black Elderberry 1000 MG CAPS Take 1,000 mg by mouth daily.   Yes [provider]  Cholecalciferol (VITAMIN D ) 50 MCG (2000 UT) tablet Take 2,000 Units by mouth daily.   Yes [provider]  Difluprednate  0.05 % EMUL Place 1 drop into the right eye 2 (two) times daily.   Yes [provider]  dorzolamide -timolol  (COSOPT ) 2-0.5 % ophthalmic solution Place 1 drop into both eyes 2 (two) times daily. 11/04/23  Yes [provider]  ezetimibe  (ZETIA ) 10 MG tablet TAKE 1 TABLET BY MOUTH EVERY DAY 11/17/23  Yes Berglund, Laura H, MD  glipiZIDE  (GLUCOTROL ) 5 MG tablet TAKE 1 TABLET BY MOUTH TWICE A DAY Patient taking differently: Take 5 mg by mouth daily. 01/07/24  Yes Sheron Dixons, MD  glucose blood (ONETOUCH ULTRA) test strip USE TO TEST BLOOD  SUGAR UP TO TWICE DAILY. 03/12/23  Yes Sheron Dixons, MD  hydrOXYzine  (ATARAX ) 25 MG tablet TAKE 0.5-1 TABLETS (12.5-25 MG TOTAL) BY MOUTH EVERY 8 (EIGHT) HOURS AS NEEDED FOR ANXIETY. 02/16/24  Yes Sheron Dixons, MD  insulin  glargine (LANTUS  SOLOSTAR) 100 UNIT/ML Solostar Pen Inject 30 Units into the skin every morning only - no evening dose 03/03/23  Yes Berglund, Laura H, MD  JANUVIA 100 MG tablet TAKE 1 TABLET BY MOUTH EVERY DAY 02/11/24  Yes Sheron Dixons, MD  pioglitazone  (ACTOS ) 15 MG tablet TAKE 1 TABLET BY MOUTH EVERY DAY 02/16/24  Yes Sheron Dixons, MD  rosuvastatin  (CRESTOR ) 40 MG tablet Take 1 tablet (40 mg total) by mouth at bedtime. 01/27/24  Yes Sheron Dixons, MD  spironolactone  (ALDACTONE ) 25 MG tablet Take 1 tablet (25 mg total) by mouth daily. 04/17/21  Yes Sheron Dixons, MD  traZODone  (DESYREL ) 50 MG tablet TAKE 1 TABLET BY MOUTH EVERYDAY AT  BEDTIME Patient taking differently: Take 50 mg by mouth at bedtime as needed for sleep. 05/13/23  Yes Sheron Dixons, MD  lisinopril  (ZESTRIL ) 40 MG tablet Take 1 tablet (40 mg total) by mouth daily. 03/14/23   Sheron Dixons, MD  oxyCODONE  (OXY IR/ROXICODONE ) 5 MG immediate release tablet Take 1 tablet (5 mg total) by mouth every 4 (four) hours as needed for moderate pain (pain score 4-6) (pain score 4-6). 12/18/23   Wadie Guile, PA-C  albuterol  (PROAIR  HFA) 108 (90 Base) MCG/ACT inhaler Inhale 2 puffs into the lungs every 6 (six) hours as needed for wheezing or shortness of breath. 02/18/19 12/30/19  Sheron Dixons, MD    Family History Family History  Problem Relation Age of Onset   Cancer Mother        Breast   Breast cancer Mother    Heart disease Father    Heart attack Brother    Cancer Maternal Aunt        breast   Breast cancer Maternal Aunt    Breast cancer Cousin        mat cousin    Social History Social History   Tobacco Use   Smoking status: Former    Current packs/day: 0.00    Average  packs/day: 1 pack/day for 7.0 years (7.0 ttl pk-yrs)    Types: Cigarettes    Start date: 33    Quit date: 1965    Years since quitting: 60.4   Smokeless tobacco: Never   Tobacco comments:    smoking cessation materials not required  Vaping Use   Vaping status: Never Used  Substance Use Topics   Alcohol use: No   Drug use: No     Allergies   Farxiga [dapagliflozin], Alprazolam, Amoxicillin, Aricept [donepezil], Aspirin , Metformin and related, Riomet [metformin hcl], Hydrocodone , Nabumetone, Skelaxin [metaxalone], and Sulfa antibiotics   Review of Systems Review of Systems  Constitutional:  Negative for fever.  Eyes:  Negative for visual disturbance.  Gastrointestinal:  Positive for nausea.  Neurological:  Positive for dizziness, light-headedness and headaches. Negative for weakness and numbness.     Physical Exam Triage Vital Signs ED Triage Vitals  Encounter Vitals Group     BP      Systolic BP Percentile      Diastolic BP Percentile      Pulse      Resp      Temp      Temp src      SpO2      Weight      Height      Head Circumference      Peak Flow      Pain Score      Pain Loc      Pain Education      Exclude from Growth Chart    No data found.  Updated Vital Signs BP (!) 140/63 (BP Location: Left Arm)   Pulse 80   Temp 98.5 F (36.9 C) (Oral)   Ht 5\' 9"  (1.753 m)   Wt 183 lb (83 kg)   SpO2 100%   BMI 27.02 kg/m   Visual Acuity Right Eye Distance:   Left Eye Distance:   Bilateral Distance:    Right Eye Near:   Left Eye Near:    Bilateral Near:     Physical Exam Vitals and nursing note reviewed.  Constitutional:      Appearance: Normal appearance. She is not ill-appearing.  HENT:  Head: Normocephalic and atraumatic.     Right Ear: Tympanic membrane, ear canal and external ear normal. There is no impacted cerumen.     Left Ear: Tympanic membrane, ear canal and external ear normal. There is no impacted cerumen.     Ears:      Comments: Both external auditory canals are moderately ceruminous but unable to visualize both tympanic membranes at their central aspect.  Both membranes are pearly gray in appearance.    Mouth/Throat:     Mouth: Mucous membranes are moist.     Pharynx: Oropharynx is clear. No posterior oropharyngeal erythema.  Eyes:     Extraocular Movements: Extraocular movements intact.     Comments: Right pupil is irregular, though this is baseline per ophthalmology report.  Also mild ptosis to the right eyelid.  Cardiovascular:     Rate and Rhythm: Normal rate and regular rhythm.     Pulses: Normal pulses.     Heart sounds: Normal heart sounds. No murmur heard.    No friction rub. No gallop.  Pulmonary:     Effort: Pulmonary effort is normal.     Breath sounds: Normal breath sounds. No wheezing, rhonchi or rales.  Musculoskeletal:     Cervical back: Normal range of motion and neck supple. No tenderness.  Lymphadenopathy:     Cervical: No cervical adenopathy.  Skin:    General: Skin is warm and dry.     Capillary Refill: Capillary refill takes less than 2 seconds.     Findings: No rash.  Neurological:     General: No focal deficit present.     Mental Status: She is alert and oriented to person, place, and time.     Cranial Nerves: No cranial nerve deficit.     Sensory: No sensory deficit.     Motor: No weakness.     Coordination: Coordination normal.     Gait: Gait normal.     Deep Tendon Reflexes: Reflexes normal.      UC Treatments / Results  Labs (all labs ordered are listed, but only abnormal results are displayed) Labs Reviewed - No data to display  EKG Normal sinus rhythm with a ventricular rate of 71 bpm PR interval 158 ms QRS duration 84 ms QT/QTc 390/420 ms No ST or T wave abnormalities noted.  Radiology No results found.  Procedures Procedures (including critical care time)  Medications Ordered in UC Medications - No data to display  Initial Impression /  Assessment and Plan / UC Course  I have reviewed the triage vital signs and the nursing notes.  Pertinent labs & imaging results that were available during my care of the patient were reviewed by me and considered in my medical decision making (see chart for details).   Patient is a pleasant, nontoxic-appearing 83 year old female presenting for evaluation of dizziness and lightheadedness as outlined in HPI above.  I suspect that most likely this is exacerbation of the patient's vertigo given that she has been experiencing lightheadedness and dizziness and a feeling of off balance that is worse when she lays down.  Her physical exam is reassuring as cranial nerves II through XII are intact.  Her right pupil is irregular at baseline but her EOM is intact.  Bilateral grips, upper extremity strength, and lower extremity strength are all 5/5.  DTRs are 2+ globally.  Cardiopulmonary exam is benign.  Patient has normal proprioception with normal finger-to-nose and heel-to-shin.  Negative pronator drift and negative Romberg.  However, the patient's  blood pressure is mildly elevated at 140/63 and her last office visit with Dr. Gala Jubilee on 03/08/2024 patient's blood pressure was well-controlled and her blood pressure was 126/72 at that time.  She is also experiencing a headache in her left temple region and patient does not have a history of headaches and she denies any trauma.  For this reason, coupled with her age, I do feel that she should be evaluated in the emergency department for intracranial imaging which we are unable to perform at urgent care.  I do feel she is safe to travel via POV as her symptoms have been going on for the past week.  She will have family ember transport her to [facility].   Final Clinical Impressions(s) / UC Diagnoses   Final diagnoses:  Dizziness     Discharge Instructions      As we discussed, I suspect that your dizziness is most likely related to a worsening of your vertigo,  however, given that your blood pressure is mildly elevated and you are experiencing headache in your left temple region I do feel that you should have a CT scan of your head which we are unable to perform at urgent care.  Please go to [facility] to be evaluated in the emergency department for your headache, dizziness, and lightheadedness.   ED Prescriptions   None    PDMP not reviewed this encounter.   Kent Pear, NP 03/19/24 1051

## 2024-03-19 NOTE — Discharge Instructions (Signed)
 As we discussed, try the meclizine/Antivert medicine to help with your vertigo.  Use as needed, up to 3 times per day.  Also try your anxiety medicine, particularly before bed, this can be helpful  If your symptoms worsen despite these measures then please return to the ED, otherwise you can reach out to the ENT doctor for follow-up

## 2024-03-19 NOTE — ED Provider Notes (Signed)
 [facility] Provider Note    Event Date/Time   First MD Initiated Contact with Patient 03/19/24 1318     (approximate)   History   Dizziness   HPI  Cathy Murphy is a 83 y.o. female who presents to the ED for evaluation of Dizziness   I reviewed PCP visit from 11 days ago.  History of HTN, HLD, DM, CKD  Patient presents to the ED for evaluation of 1-2 weeks of intermittent vertigo, dizziness, particularly when she lays down at night to go to bed.  Reports that she feels okay during the daytime and when she is up and walking, only has symptoms when she lays her head backwards to go to sleep at night.  Also reports a history of anxiety and has not been taking her anxiety medications.  No syncope or head trauma.  No vision changes or weakness to the extremities, gait changes.  No speech changes.  No symptoms when twisting her head left or right.   Physical Exam   Triage Vital Signs: ED Triage Vitals  Encounter Vitals Group     BP 03/19/24 1147 (!) 165/71     Systolic BP Percentile --      Diastolic BP Percentile --      Pulse Rate 03/19/24 1147 81     Resp 03/19/24 1147 16     Temp 03/19/24 1147 99 F (37.2 C)     Temp Source 03/19/24 1147 Oral     SpO2 03/19/24 1147 100 %     Weight 03/19/24 1148 183 lb (83 kg)     Height 03/19/24 1148 5\' 9"  (1.753 m)     Head Circumference --      Peak Flow --      Pain Score 03/19/24 1147 0     Pain Loc --      Pain Education --      Exclude from Growth Chart --     Most recent vital signs: Vitals:   03/19/24 1147  BP: (!) 165/71  Pulse: 81  Resp: 16  Temp: 99 F (37.2 C)  SpO2: 100%    General: Awake, no distress.  CV:  Good peripheral perfusion.  Resp:  Normal effort.  Abd:  No distention.  MSK:  No deformity noted.  Neuro:  No focal deficits appreciated. Cranial nerves II through XII intact 5/5 strength and sensation in all 4 extremities Other:     ED Results / Procedures / Treatments    Labs (all labs ordered are listed, but only abnormal results are displayed) Labs Reviewed  COMPREHENSIVE METABOLIC PANEL WITH GFR - Abnormal; Notable for the following components:      Result Value   BUN 28 (*)    Creatinine, Ser 1.49 (*)    Total Bilirubin 1.4 (*)    GFR, Estimated 35 (*)    All other components within normal limits  CBC - Abnormal; Notable for the following components:   Hemoglobin 11.7 (*)    All other components within normal limits  URINALYSIS, ROUTINE W REFLEX MICROSCOPIC - Abnormal; Notable for the following components:   Color, Urine YELLOW (*)    APPearance HAZY (*)    Specific Gravity, Urine 1.031 (*)    Protein, ur 30 (*)    Leukocytes,Ua MODERATE (*)    Bacteria, UA RARE (*)    All other components within normal limits  CBG MONITORING, ED - Abnormal; Notable for the following components:   Glucose-Capillary 63 (*)  All other components within normal limits  URINE CULTURE    EKG Sinus rhythm with a rate of 77 bpm.  Normal axis and intervals.  No clear signs of acute ischemia.  RADIOLOGY CT head interpreted by me without evidence of acute intracranial pathology  Official radiology report(s): CT HEAD WO CONTRAST ( ) Result Date: 03/19/2024 CLINICAL DATA:  new headache and vertigo EXAM: CT HEAD WITHOUT CONTRAST TECHNIQUE: Contiguous axial images were obtained from the base of the skull through the vertex without intravenous contrast. RADIATION DOSE REDUCTION: This exam was performed according to the departmental dose-optimization program which includes automated exposure control, adjustment of the mA and/or kV according to patient size and/or use of iterative reconstruction technique. COMPARISON:  December 13, 2011 FINDINGS: Brain: The ventricles appear age appropriate. No mass effect or midline shift. Gray-white differentiation is preserved.Scattered periventricular white matter hypoattenuation, most consistent with changes of mild chronic ischemic  microvascular disease.No evidence of acute territorial infarction, extra-axial fluid collection, hemorrhage, or mass lesion. The basilar cisterns are patent without downward herniation. The cerebellar hemispheres and vermis are well formed without mass lesion or focal attenuation abnormality. Vascular: No hyperdense vessel. Skull: Normal. Negative for fracture or focal lesion. Small sclerotic lesion in the clivus, unchanged, likely a bone island. Sinuses/Orbits: The paranasal sinuses and mastoids are clear. The globes appear intact. No retrobulbar hematoma. Other: None. IMPRESSION: No acute intracranial abnormality, specifically, no acute hemorrhage, territorial infarction, or intracranial mass. Electronically Signed   By: Rance Burrows M.D.   On: 03/19/2024 14:47    PROCEDURES and INTERVENTIONS:  .1-3 Lead EKG Interpretation  Performed by: Arline Bennett, MD Authorized by: Arline Bennett, MD     Interpretation: normal     ECG rate:  80   ECG rate assessment: normal     Rhythm: sinus rhythm     Ectopy: none     Conduction: normal     Medications  meclizine (ANTIVERT) tablet 25 mg (25 mg Oral Given 03/19/24 1405)  butalbital-acetaminophen -caffeine (FIORICET) 50-325-40 MG per tablet 2 tablet (2 tablets Oral Given 03/19/24 1404)     IMPRESSION / MDM / ASSESSMENT AND PLAN / ED COURSE  I reviewed the triage vital signs and the nursing notes.  Differential diagnosis includes, but is not limited to, stroke, BPPV, anxiety, dehydration  {Patient presents with symptoms of an acute illness or injury that is potentially life-threatening.  Patient presents to the ED with intermittent nighttime vertigo that is positional and likely of peripheral etiology, suitable for outpatient management.  Looks well to me, asymptomatic with neurologic exam without acute deficits.  CKD near baseline.  Normal CBC.  Urine with squamous cells and she has no urinary symptoms.  CT head without acute features.  Feeling  better with some meclizine.  Discharged with same and ENT referral.  Discussed ED return precautions.  Clinical Course as of 03/19/24 1503  Fri Mar 19, 2024  1500 Reassessed.  Feeling okay.  Clarified that she has had no urinary symptoms.  We discussed meclizine, ENT follow-up and possible ED return precautions.  Answered questions.  Suitable for outpatient management. [DS]    Clinical Course User Index [DS] Arline Bennett, MD     FINAL CLINICAL IMPRESSION(S) / ED DIAGNOSES   Final diagnoses:  Vertigo  Dizziness  Benign paroxysmal positional vertigo, unspecified laterality     Rx / DC Orders   ED Discharge Orders          Ordered    meclizine (ANTIVERT) 25 MG tablet  3 times daily PRN        03/19/24 1500             Note:  This document was prepared using Dragon voice recognition software and may include unintentional dictation errors.   Arline Bennett, MD 03/19/24 [phone]

## 2024-03-19 NOTE — ED Triage Notes (Signed)
 Pt c/o lightheaded and dizziness x1week  Pt states that she has to sit up when going to bed because she feels worse when laying down.  Pt states that when she lays down she feels nauseous and like she will pass out.

## 2024-03-19 NOTE — Discharge Instructions (Signed)
 As we discussed, I suspect that your dizziness is most likely related to a worsening of your vertigo, however, given that your blood pressure is mildly elevated and you are experiencing headache in your left temple region I do feel that you should have a CT scan of your head which we are unable to perform at urgent care.  Please go to [facility] to be evaluated in the emergency department for your headache, dizziness, and lightheadedness.

## 2024-03-19 NOTE — ED Notes (Signed)
Patient given orange juice and graham crackers.

## 2024-03-19 NOTE — ED Triage Notes (Addendum)
 Patient c/o lightheaded and dizziness X1 week. Reports laying upright when going to sleep because lying down makes her feel worse. Also experiencing severe nausea. Reports sometimes feeling near syncope.  Denies injury  Reports headache this AM and took tylenol  with relief  Patient reports she is diabetic and taking insulin  but not sure the last time she checked her blood sugar due to having no needles  Reports she took 30 units of insulin  at 0900 today

## 2024-03-20 LAB — URINE CULTURE

## 2024-03-25 ENCOUNTER — Ambulatory Visit: Payer: Self-pay

## 2024-03-25 ENCOUNTER — Telehealth: Payer: Self-pay | Admitting: Internal Medicine

## 2024-03-25 NOTE — Telephone Encounter (Signed)
 Noted  Pt has a appt.  KP

## 2024-03-25 NOTE — Telephone Encounter (Signed)
 Chief Complaint: dizziness Symptoms: cough, dizziness Frequency: few weeks Pertinent Negatives: Patient denies fever, CP, SOB, falls, head strikes, severe H/A, one-sided weakness or numbness Disposition: [] ED /[] Urgent Care (no appt availability in office) / [x] Appointment(In office/virtual)/ []  [facility]/ [] Home Care/ [] Refused Recommended Disposition /[] [facility]/ []  Follow-up with PCP Additional Notes: Pt reports dizziness and a dry cough. Pt was seen in the office 5/5 and had a cough then. Pt was taken off lisinopril  due to cough. Pt states the cough has not improved and may have actually gotten worse. Pt reports a dry cough. Pt also reports episodes of dizziness that more often occur at nighttime. Pt was seen in the ED 5/16 and diagnosed with vertigo. Pt was prescribed meclizine  which she took for a few days and then stopped taking because she states it made no difference. Pt states she can still walk when she is dizzy and denies needing to hold onto furniture or the wall to steady herself. Pt denies falls or head strikes. RN scheduled pt for tomorrow for evaluation of dizziness and to discuss cough/resuming lisinopril . RN advised pt if she develops worsening dizziness and it becomes severe and she cannot walk, or if she develops vomiting, headache, CP, SOB, or one-sided weakness she must call 911. Pt verbalized understanding.      Copied from CRM [id number]. Topic: Clinical - Medication Question >> Mar 25, 2024  2:31 PM Leory Rands wrote: Reason for CRM: Patient is calling to report that she was doing a lot of coughing. Dr. Gala Jubilee took her off of lisinopril  (ZESTRIL ) 40 MG tablet [[id number]] 03/08/24. Patient is calling to report that she is still doing a lot of coughing. Patient would like to know should she resume taking her BP medication? Patient went to the ED 03/19/24 because she was having dizzy spells advised she has vertigo. Patient does not believe it.  Patient  reporting she is still have dizzy spells. Patient was advised to take meclizine  (ANTIVERT ) 25 MG tablet [[id number]]. Patient stopped taking medication because she did not think that the medication was helping.  Patient reporting maybe she she need to ENT doctor. Because she had a wax building. Reason for Disposition  [1] MODERATE dizziness (e.g., vertigo; feels very unsteady, interferes with normal activities) AND [2] has NOT been evaluated by doctor (or NP/PA) for this  Answer Assessment - Initial Assessment Questions 1. DESCRIPTION: "Describe your dizziness."     "When I go to bed at night and get ready to lie down, I have to prop myself up on two pillows, if I can lay on two pillows I will be fine", worse at night; still happens during the day time; "swimming headed"; diagnosed with vertigo in the ED 5/16 2. VERTIGO: "Do you feel like either you or the room is spinning or tilting?"      No  3. LIGHTHEADED: "Do you feel lightheaded?" (e.g., somewhat faint, woozy, weak upon standing)     When she gets up or moves her head a certain way 4. SEVERITY: "How bad is it?"  "Can you walk?"   - MILD: Feels slightly dizzy and unsteady, but is walking normally.   - MODERATE: Feels unsteady when walking, but not falling; interferes with normal activities (e.g., school, work).   - SEVERE: Unable to walk without falling, or requires assistance to walk without falling.     "I can walk without falling", does not have to hold onto furniture or the wall  5. ONSET:  "  When did the dizziness begin?"     "It's been more than a week, it's been a week since I went to the ED. [facility] and they sent me to the ED, they didn't have any equipment to take pictures of my head. They did a CT scan at the hospital" 6. AGGRAVATING FACTORS: "Does anything make it worse?" (e.g., standing, change in head position)     Standing, turning her head 7. CAUSE: "What do you think is causing the dizziness?"     Not sure - pt was  diagnosed with vertigo  8. RECURRENT SYMPTOM: "Have you had dizziness before?" If Yes, ask: "When was the last time?" "What happened that time?"     Laying down makes it better; pt was prescribed meclizine  in the ED, pt states it was not working 9. OTHER SYMPTOMS: "Do you have any other symptoms?" (e.g., headache, weakness, numbness, vomiting, earache)    Denies CP. States she is eating and drinking enough. Denies SOB. "I have headaches sometimes, but I don't think they happen more than usual." Pt states the doctor told her she was wax in her ears. Denies hearing loss. Denies ear pain. Denies one-sided weakness or numbness. Pt states "I have problems with my eyes anyway, that has nothing to do with my dizziness."  Reports cough. Taken off lisinopril  5/5 d/t cough; pt states the cough is worse than it was. "I will be looking at the TV and start coughing, there is nothing coming up, like there is something in my throat that is real dry"; dry cough. Denies other respiratory or flu-like symptoms.  Pt checked her BP last last week.  Protocols used: Dizziness - Vertigo-A-AH

## 2024-03-25 NOTE — Telephone Encounter (Signed)
 Copied from CRM [id number]. Topic: Clinical - Medication Question >> Mar 25, 2024  2:31 PM Leory Rands wrote: Reason for CRM: Patient is calling to report that she was doing a lot of coughing. Dr. Gala Jubilee took her off of lisinopril  (ZESTRIL ) 40 MG tablet [[id number]] 03/08/24. Patient is calling to report that she is still doing a lot of coughing. Patient would like to know should she resume taking her BP medication? Patient went to the ED 03/19/24 because she was having dizzy spells advised she has vertigo. Patient does not believe it.  Patient reporting she is still have dizzy spells. Patient was advised to take meclizine  (ANTIVERT ) 25 MG tablet [[id number]]. Patient stopped taking medication because she did not think that the medication was helping.  Patient reporting maybe she she need to ENT doctor. Because she had a wax building.

## 2024-03-26 ENCOUNTER — Ambulatory Visit
Admission: RE | Admit: 2024-03-26 | Discharge: 2024-03-26 | Disposition: A | Discharge status: Home / Self Care | Source: Ambulatory Visit | Attending: Internal Medicine | Admitting: Internal Medicine

## 2024-03-26 ENCOUNTER — Ambulatory Visit
Admission: RE | Admit: 2024-03-26 | Discharge: 2024-03-26 | Disposition: A | Discharge status: Home / Self Care | Attending: Internal Medicine | Admitting: Internal Medicine

## 2024-03-26 ENCOUNTER — Other Ambulatory Visit: Payer: Self-pay | Admitting: Internal Medicine

## 2024-03-26 ENCOUNTER — Encounter: Payer: Self-pay | Admitting: Internal Medicine

## 2024-03-26 ENCOUNTER — Ambulatory Visit (INDEPENDENT_AMBULATORY_CARE_PROVIDER_SITE_OTHER): Admitting: Internal Medicine

## 2024-03-26 VITALS — BP 118/62 | HR 74 | Ht 69.0 in | Wt 180.4 lb

## 2024-03-26 DIAGNOSIS — I2510 Atherosclerotic heart disease of native coronary artery without angina pectoris: Secondary | ICD-10-CM | POA: Diagnosis not present

## 2024-03-26 DIAGNOSIS — R053 Chronic cough: Secondary | ICD-10-CM | POA: Diagnosis not present

## 2024-03-26 DIAGNOSIS — H6123 Impacted cerumen, bilateral: Secondary | ICD-10-CM | POA: Diagnosis not present

## 2024-03-26 DIAGNOSIS — R059 Cough, unspecified: Secondary | ICD-10-CM

## 2024-03-26 DIAGNOSIS — I10 Essential (primary) hypertension: Secondary | ICD-10-CM

## 2024-03-26 DIAGNOSIS — E118 Type 2 diabetes mellitus with unspecified complications: Secondary | ICD-10-CM

## 2024-03-26 MED ORDER — OMEPRAZOLE 20 MG PO CPDR
20.0000 mg | DELAYED_RELEASE_CAPSULE | Freq: Every day | ORAL | 0 refills | Status: DC
Start: 2024-03-26 — End: 2024-04-22

## 2024-03-26 NOTE — Assessment & Plan Note (Signed)
 BP is controlled today despite stopping lisinopril  for cough. Cough continues but will not resume lisinopril  due to normal BP. Continue spironolactone .

## 2024-03-26 NOTE — Progress Notes (Signed)
 Date:  03/26/2024   Name:  Cathy Murphy   DOB:  [birth year]   MRN:  [id number]   Chief Complaint: Cough (Patient said she has been having a cough on and off for a couple of weeks) and Dizziness (Patient said she has been feeling dizzy, happens when she gets up after laying down for a few minutes, on and off for a couple of weeks)  Cough This is a chronic problem. The current episode started more than 1 month ago. The problem has been unchanged. The problem occurs every few minutes. The cough is Non-productive. Pertinent negatives include no chest pain, chills, fever, headaches, heartburn, postnasal drip, shortness of breath or wheezing.  Dizziness This is a new problem. The current episode started 1 to 4 weeks ago. The problem occurs daily. The problem has been unchanged. Associated symptoms include coughing. Pertinent negatives include no chest pain, chills, fatigue, fever, headaches, nausea, swollen glands, vertigo or vomiting. Exacerbated by: lying down. Treatments tried: meclizine . The treatment provided no relief.    Review of Systems  Constitutional:  Negative for chills, fatigue and fever.  HENT:  Negative for postnasal drip.   Respiratory:  Positive for cough. Negative for chest tightness, shortness of breath and wheezing.   Cardiovascular:  Negative for chest pain and palpitations.  Gastrointestinal:  Negative for abdominal distention, constipation, heartburn, nausea and vomiting.  Neurological:  Positive for dizziness. Negative for vertigo and headaches.  Psychiatric/Behavioral:  Negative for dysphoric mood. The patient is not nervous/anxious.      Lab Results  Component Value Date   NA 138 03/19/2024   K 4.1 03/19/2024   CO2 24 03/19/2024   GLUCOSE 73 03/19/2024   BUN 28 (H) 03/19/2024   CREATININE 1.49 (H) 03/19/2024   CALCIUM  9.7 03/19/2024   EGFR 34 (L) 03/08/2024   GFRNONAA 35 (L) 03/19/2024   Lab Results  Component Value Date   CHOL 152 03/08/2024   HDL 55  03/08/2024   LDLCALC 76 03/08/2024   TRIG 115 03/08/2024   CHOLHDL 2.8 03/08/2024   Lab Results  Component Value Date   TSH 2.080 03/08/2024   Lab Results  Component Value Date   HGBA1C 6.2 (H) 03/08/2024   Lab Results  Component Value Date   WBC 8.4 03/19/2024   HGB 11.7 (L) 03/19/2024   HCT 38.1 03/19/2024   MCV 94.3 03/19/2024   PLT 203 03/19/2024   Lab Results  Component Value Date   ALT 11 03/19/2024   AST 16 03/19/2024   ALKPHOS 41 03/19/2024   BILITOT 1.4 (H) 03/19/2024   Lab Results  Component Value Date   VD25OH 12.8 (L) 06/15/2015     Patient Active Problem List   Diagnosis Date Noted   History of total knee arthroplasty, 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   Asymptomatic bilateral carotid artery stenosis 11/19/2023   Vascular calcification 07/22/2023   Syncope 12/06/2021   Difficulty sleeping 12/29/2020   Mild cognitive impairment 12/29/2020   Headache disorder 12/29/2020   Stage 3a chronic kidney disease (HCC) 08/16/2020   Osteopenia determined by x-ray 08/05/2019   Atherosclerosis of abdominal aorta (HCC) 06/09/2019   Coronary artery disease involving native coronary artery of native heart 06/09/2019   BMI 24.0-24.9, adult 11/06/2018   Bilateral neck pain 10/08/2018   Aortic calcification (HCC) 03/05/2018   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   Ventral hernia without obstruction or gangrene 03/06/2016   Type II diabetes mellitus with complication (HCC) 11/09/2015   Plantar fasciitis, right 06/16/2015  Knee torn cartilage 04/26/2015   Allergic rhinitis, seasonal 04/26/2015   Essential (primary) hypertension 04/26/2015   Glaucoma 04/26/2015   Hyperlipidemia associated with type 2 diabetes mellitus (HCC) 04/26/2015   Insomnia 04/26/2015   Primary osteoarthritis of left knee 05/03/2014    Allergies  Allergen Reactions   Farxiga [Dapagliflozin] Itching    Genital yeast infection   Alprazolam Nausea Only   Amoxicillin Diarrhea    IgE = 290 (WNL) on 12/11/2023    Aricept [Donepezil] Other (See Comments)    Leg cramps   Aspirin  Nausea And Vomiting   Metformin And Related Diarrhea   Riomet [Metformin Hcl] Nausea And Vomiting   Hydrocodone  Rash   Nabumetone Rash   Skelaxin [Metaxalone] Rash   Sulfa Antibiotics Other (See Comments)    unknown  Does not remember    Past Surgical History:  Procedure Laterality Date   CATARACT EXTRACTION W/PHACO Left 10/01/2021   Procedure: CATARACT EXTRACTION PHACO AND INTRAOCULAR LENS PLACEMENT (IOC) LEFT HYDRUS MICROSTENT DIABETIC;  Surgeon: Rosa College, MD;  Location: [facility];  Service: Ophthalmology;  Laterality: Left;  Diabetic 7.49 00:42.1   CATARACT EXTRACTION W/PHACO Right 10/15/2021   Procedure: PHACO EMULSIFICATION  with HYDRUS MICROSTENT iMPLANTATION;  Surgeon: Rosa College, MD;  Location: [facility];  Service: Ophthalmology;  Laterality: Right;  Diabetic 4.98 00:27.3   COLONOSCOPY WITH PROPOFOL  N/A 11/25/2017   Procedure: COLONOSCOPY WITH PROPOFOL ;  Surgeon: Deveron Fly, MD;  Location: [facility];  Service: Endoscopy;  Laterality: N/A;   ECTOPIC PREGNANCY SURGERY     KNEE ARTHROPLASTY Left 12/17/2023   Procedure: COMPUTER ASSISTED TOTAL KNEE ARTHROPLASTY;  Surgeon: Arlyne Lame, MD;  Location: [facility];  Service: Orthopedics;  Laterality: Left;   LIPOMA RESECTION     right posterior neck   TOTAL ABDOMINAL HYSTERECTOMY W/ BILATERAL SALPINGOOPHORECTOMY      Social History   Tobacco Use   Smoking status: Former    Current packs/day: 0.00    Average packs/day: 1 pack/day for 7.0 years (7.0 ttl pk-yrs)    Types: Cigarettes    Start date: 40    Quit date: 1965    Years since quitting: 60.4   Smokeless tobacco: Never   Tobacco comments:    smoking cessation materials not required  Vaping Use   Vaping status: Never Used  Substance Use Topics   Alcohol use: No   Drug use: No     Medication list has been reviewed and updated.  Current Meds   Medication Sig   Accu-Chek Softclix Lancets lancets Use as instructed   acetaminophen  (TYLENOL ) 500 MG tablet Take 1,500 mg by mouth every 6 (six) hours as needed for moderate pain (pain score 4-6) or mild pain (pain score 1-3).   bimatoprost (LUMIGAN) 0.01 % SOLN Place 1 drop into the left eye at bedtime.   Black Elderberry 1000 MG CAPS Take 1,000 mg by mouth daily.   Cholecalciferol (VITAMIN D ) 50 MCG (2000 UT) tablet Take 2,000 Units by mouth daily.   Difluprednate  0.05 % EMUL Place 1 drop into the right eye 2 (two) times daily.   dorzolamide -timolol  (COSOPT ) 2-0.5 % ophthalmic solution Place 1 drop into both eyes 2 (two) times daily.   ezetimibe  (ZETIA ) 10 MG tablet TAKE 1 TABLET BY MOUTH EVERY DAY   glipiZIDE  (GLUCOTROL ) 5 MG tablet TAKE 1 TABLET BY MOUTH TWICE A DAY (Patient taking differently: Take 5 mg by mouth daily.)   glucose blood (ONETOUCH ULTRA) test strip USE TO  TEST BLOOD SUGAR UP TO TWICE DAILY.   hydrOXYzine  (ATARAX ) 25 MG tablet TAKE 0.5-1 TABLETS (12.5-25 MG TOTAL) BY MOUTH EVERY 8 (EIGHT) HOURS AS NEEDED FOR ANXIETY.   insulin  glargine (LANTUS  SOLOSTAR) 100 UNIT/ML Solostar Pen Inject 30 Units into the skin every morning only - no evening dose   JANUVIA 100 MG tablet TAKE 1 TABLET BY MOUTH EVERY DAY   omeprazole (PRILOSEC) 20 MG capsule Take 1 capsule (20 mg total) by mouth daily.   pioglitazone  (ACTOS ) 15 MG tablet TAKE 1 TABLET BY MOUTH EVERY DAY   rosuvastatin  (CRESTOR ) 40 MG tablet Take 1 tablet (40 mg total) by mouth at bedtime.   spironolactone  (ALDACTONE ) 25 MG tablet Take 1 tablet (25 mg total) by mouth daily.   traZODone  (DESYREL ) 50 MG tablet TAKE 1 TABLET BY MOUTH EVERYDAY AT BEDTIME (Patient taking differently: Take 50 mg by mouth at bedtime as needed for sleep.)   [DISCONTINUED] B-D UF III MINI PEN NEEDLES 31G X 5 MM MISC USE AS DIRECTED WITH LEVEMIR    [DISCONTINUED] lisinopril  (ZESTRIL ) 40 MG tablet Take 1 tablet (40 mg total) by mouth daily.   [DISCONTINUED]  meclizine  (ANTIVERT ) 25 MG tablet Take 1 tablet (25 mg total) by mouth 3 (three) times daily as needed.       03/08/2024    9:59 AM 07/22/2023   11:02 AM 03/14/2023   10:10 AM 11/13/2022    9:55 AM  GAD 7 : Generalized Anxiety Score  Nervous, Anxious, on Edge 1 2 1  0  Control/stop worrying 0 2 1 0  Worry too much - different things 1 2 1  0  Trouble relaxing 1 3 1  0  Restless 1 3 1  0  Easily annoyed or irritable 1 2 0 0  Afraid - awful might happen 1 2 0 0  Total GAD 7 Score 6 16 5  0  Anxiety Difficulty Not difficult at all Not difficult at all Not difficult at all Not difficult at all       03/08/2024    9:59 AM 07/28/2023   10:16 AM 07/23/2023   11:30 AM  Depression screen PHQ 2/9  Decreased Interest 1 0 0  Down, Depressed, Hopeless 1 1 0  PHQ - 2 Score 2 1 0  Altered sleeping 0 1 0  Tired, decreased energy 1 1 0  Change in appetite 0 0 0  Feeling bad or failure about yourself  0 0 0  Trouble concentrating 1 0 0  Moving slowly or fidgety/restless 0 0 0  Suicidal thoughts 0 0 0  PHQ-9 Score 4 3 0  Difficult doing work/chores Not difficult at all  Not difficult at all    BP Readings from Last 3 Encounters:  03/26/24 122/64  03/19/24 (!) 165/71  03/19/24 (!) 140/63    Physical Exam Vitals and nursing note reviewed.  Constitutional:      General: She is not in acute distress.    Appearance: Normal appearance. She is well-developed.  HENT:     Head: Normocephalic and atraumatic.     Right Ear: There is impacted cerumen.     Left Ear: There is impacted cerumen.     Nose:     Right Sinus: No maxillary sinus tenderness.     Left Sinus: No maxillary sinus tenderness.  Cardiovascular:     Rate and Rhythm: Normal rate and regular rhythm.     Heart sounds: No murmur heard. Pulmonary:     Effort: Pulmonary effort is normal. No respiratory  distress.     Breath sounds: No wheezing or rhonchi.  Musculoskeletal:     Cervical back: Normal range of motion.     Right lower leg:  No edema.     Left lower leg: No edema.  Lymphadenopathy:     Cervical: No cervical adenopathy.  Skin:    General: Skin is warm and dry.     Findings: No rash.  Neurological:     General: No focal deficit present.     Mental Status: She is alert and oriented to person, place, and time.  Psychiatric:        Mood and Affect: Mood normal.        Behavior: Behavior normal.     Wt Readings from Last 3 Encounters:  03/26/24 180 lb 6 oz (81.8 kg)  03/19/24 183 lb (83 kg)  03/19/24 183 lb (83 kg)    BP 122/64 Comment: sitting  Pulse 74   Ht 5\' 9"  (1.753 m)   Wt 180 lb 6 oz (81.8 kg)   SpO2 98%   BMI 26.64 kg/m   Assessment and Plan:  Problem List Items Addressed This Visit       Unprioritized   Essential (primary) hypertension (Chronic)   BP is controlled today despite stopping lisinopril  for cough. Cough continues but will not resume lisinopril  due to normal BP. Continue spironolactone .      Other Visit Diagnoses       Cough in adult    -  Primary   will get CXR to rule out infection, mass also trial of omeprazole daily x 30 days   Relevant Orders   Ambulatory referral to ENT   DG Chest 2 View     Bilateral impacted cerumen       Refer to ENT unclear is this is realted to her complaint of lightheadedness   Relevant Medications   omeprazole (PRILOSEC) 20 MG capsule       No follow-ups on file.    Sheron Dixons, MD [facility] [place]

## 2024-03-30 NOTE — Telephone Encounter (Signed)
 Requested Prescriptions  Pending Prescriptions Disp Refills   insulin  glargine (LANTUS  SOLOSTAR) 100 UNIT/ML Solostar Pen [Pharmacy Med Name: LANTUS  SOLOSTAR 100 UNIT/ML] 30 mL 1    Sig: INJECT 30 UNITS INTO THE SKIN EVERY MORNING ONLY - NO EVENING DOSE     Endocrinology:  Diabetes - Insulins Failed - 03/30/2024  8:09 AM      Failed - Valid encounter within last 6 months    Recent Outpatient Visits           4 days ago Cough in adult   [facility] at [facility], Chales [place], MD   3 weeks ago Essential (primary) hypertension    Primary Care & Sports Medicine at [facility], Chales [place], MD              Passed - HBA1C is between 0 and 7.9 and within 180 days    Hemoglobin A1C  Date Value Ref Range Status  06/25/2022 6.4  Final   Hgb A1c MFr Bld  Date Value Ref Range Status  03/08/2024 6.2 (H) 4.8 - 5.6 % Final    Comment:             Prediabetes: 5.7 - 6.4          Diabetes: >6.4          Glycemic control for adults with diabetes: <7.0

## 2024-04-02 ENCOUNTER — Ambulatory Visit: Payer: Self-pay | Admitting: Internal Medicine

## 2024-04-05 DIAGNOSIS — H6123 Impacted cerumen, bilateral: Secondary | ICD-10-CM | POA: Diagnosis not present

## 2024-04-05 DIAGNOSIS — H8110 Benign paroxysmal vertigo, unspecified ear: Secondary | ICD-10-CM | POA: Diagnosis not present

## 2024-04-16 ENCOUNTER — Other Ambulatory Visit: Payer: Self-pay | Admitting: Internal Medicine

## 2024-04-16 DIAGNOSIS — F419 Anxiety disorder, unspecified: Secondary | ICD-10-CM

## 2024-04-16 NOTE — Telephone Encounter (Signed)
 Requested Prescriptions  Pending Prescriptions Disp Refills   hydrOXYzine  (ATARAX ) 25 MG tablet [Pharmacy Med Name: HYDROXYZINE  HCL 25 MG TABLET] 270 tablet 1    Sig: TAKE 0.5-1 TABLETS (12.5-25 MG TOTAL) BY MOUTH EVERY 8 (EIGHT) HOURS AS NEEDED FOR ANXIETY.     Ear, Nose, and Throat:  Antihistamines 2 Failed - 04/16/2024 12:39 PM      Failed - Cr in normal range and within 360 days    Creatinine  Date Value Ref Range Status  07/22/2012 0.96 0.60 - 1.30 mg/dL Final   Creatinine, Ser  Date Value Ref Range Status  03/19/2024 1.49 (H) 0.44 - 1.00 mg/dL Final         Failed - Valid encounter within last 12 months    Recent Outpatient Visits           3 weeks ago Cough in adult   [facility] Primary [facility] Medicine at [facility], Chales [place], MD   1 month ago Essential (primary) hypertension   [facility] Primary Care & Sports Medicine at [facility], Chales [place], MD

## 2024-04-20 ENCOUNTER — Other Ambulatory Visit: Payer: Self-pay | Admitting: Internal Medicine

## 2024-04-20 DIAGNOSIS — Z96652 Presence of left artificial knee joint: Secondary | ICD-10-CM | POA: Diagnosis not present

## 2024-04-20 DIAGNOSIS — M1712 Unilateral primary osteoarthritis, left knee: Secondary | ICD-10-CM | POA: Diagnosis not present

## 2024-04-20 DIAGNOSIS — H6123 Impacted cerumen, bilateral: Secondary | ICD-10-CM

## 2024-04-22 NOTE — Telephone Encounter (Signed)
 Requested Prescriptions  Pending Prescriptions Disp Refills   omeprazole  (PRILOSEC) 20 MG capsule [Pharmacy Med Name: OMEPRAZOLE  DR 20 MG CAPSULE] 90 capsule 0    Sig: TAKE 1 CAPSULE BY MOUTH EVERY DAY     Gastroenterology: Proton Pump Inhibitors Failed - 04/22/2024  2:13 PM      Failed - Valid encounter within last 12 months    Recent Outpatient Visits           3 weeks ago Cough in adult   [facility] Primary Care & Sports Medicine at [facility], Chales [place], MD   1 month ago Essential (primary) hypertension   [facility] Primary Care & Sports Medicine at [facility], Chales [place], MD

## 2024-04-24 ENCOUNTER — Other Ambulatory Visit: Payer: Self-pay | Admitting: Internal Medicine

## 2024-04-26 DIAGNOSIS — I700 Atherosclerosis of aorta: Secondary | ICD-10-CM | POA: Diagnosis not present

## 2024-04-26 DIAGNOSIS — I6523 Occlusion and stenosis of bilateral carotid arteries: Secondary | ICD-10-CM | POA: Diagnosis not present

## 2024-04-26 DIAGNOSIS — E785 Hyperlipidemia, unspecified: Secondary | ICD-10-CM | POA: Diagnosis not present

## 2024-04-26 DIAGNOSIS — Z87898 Personal history of other specified conditions: Secondary | ICD-10-CM | POA: Diagnosis not present

## 2024-04-26 DIAGNOSIS — R600 Localized edema: Secondary | ICD-10-CM | POA: Diagnosis not present

## 2024-04-26 DIAGNOSIS — I10 Essential (primary) hypertension: Secondary | ICD-10-CM | POA: Diagnosis not present

## 2024-04-26 DIAGNOSIS — Z9181 History of falling: Secondary | ICD-10-CM | POA: Diagnosis not present

## 2024-04-26 DIAGNOSIS — N1832 Chronic kidney disease, stage 3b: Secondary | ICD-10-CM | POA: Diagnosis not present

## 2024-04-26 DIAGNOSIS — I2510 Atherosclerotic heart disease of native coronary artery without angina pectoris: Secondary | ICD-10-CM | POA: Diagnosis not present

## 2024-04-26 DIAGNOSIS — E118 Type 2 diabetes mellitus with unspecified complications: Secondary | ICD-10-CM | POA: Diagnosis not present

## 2024-04-27 NOTE — Telephone Encounter (Signed)
 Requested by interface surescripts  last OV 03/08/24. Future visit in 9/5/2 Requested Prescriptions  Pending Prescriptions Disp Refills   traZODone  (DESYREL ) 50 MG tablet [Pharmacy Med Name: TRAZODONE  50 MG TABLET] 90 tablet 0    Sig: TAKE 1 TABLET BY MOUTH EVERYDAY AT BEDTIME     Psychiatry: Antidepressants - Serotonin Modulator Failed - 04/27/2024 11:25 AM      Failed - Valid encounter within last 6 months    Recent Outpatient Visits           1 month ago Cough in adult   [facility] Primary Care & Sports Medicine at [facility], Leita DEL, MD   1 month ago Essential (primary) hypertension   [place] Medicine at [facility], Leita DEL, MD               5.

## 2024-04-28 ENCOUNTER — Other Ambulatory Visit: Payer: Self-pay | Admitting: Internal Medicine

## 2024-04-29 NOTE — Telephone Encounter (Signed)
 Requested Prescriptions  Pending Prescriptions Disp Refills   sitaGLIPtin (JANUVIA) 100 MG tablet [Pharmacy Med Name: JANUVIA 100 MG TABLET] 90 tablet 1    Sig: TAKE 1 TABLET BY MOUTH EVERY DAY     Endocrinology:  Diabetes - DPP-4 Inhibitors Failed - 04/29/2024  2:49 PM      Failed - Cr in normal range and within 360 days    Creatinine  Date Value Ref Range Status  07/22/2012 0.96 0.60 - 1.30 mg/dL Final   Creatinine, Ser  Date Value Ref Range Status  03/19/2024 1.49 (H) 0.44 - 1.00 mg/dL Final         Failed - Valid encounter within last 6 months    Recent Outpatient Visits           1 month ago Cough in adult   [facility] Primary Care & Sports Medicine at [facility], Leita DEL, MD   1 month ago Essential (primary) hypertension   [place] at [facility], Leita DEL, MD              Passed - HBA1C is between 0 and 7.9 and within 180 days    Hemoglobin A1C  Date Value Ref Range Status  06/25/2022 6.4  Final   Hgb A1c MFr Bld  Date Value Ref Range Status  03/08/2024 6.2 (H) 4.8 - 5.6 % Final    Comment:             Prediabetes: 5.7 - 6.4          Diabetes: >6.4          Glycemic control for adults with diabetes: <7.0

## 2024-05-12 DIAGNOSIS — E118 Type 2 diabetes mellitus with unspecified complications: Secondary | ICD-10-CM | POA: Diagnosis not present

## 2024-05-12 DIAGNOSIS — R600 Localized edema: Secondary | ICD-10-CM | POA: Diagnosis not present

## 2024-05-12 DIAGNOSIS — I10 Essential (primary) hypertension: Secondary | ICD-10-CM | POA: Diagnosis not present

## 2024-05-12 DIAGNOSIS — E785 Hyperlipidemia, unspecified: Secondary | ICD-10-CM | POA: Diagnosis not present

## 2024-05-29 ENCOUNTER — Other Ambulatory Visit: Payer: Self-pay | Admitting: Internal Medicine

## 2024-05-31 NOTE — Telephone Encounter (Signed)
 Requested Prescriptions  Refused Prescriptions Disp Refills   BD PEN NEEDLE MINI ULTRAFINE 31G X 5 MM MISC [Pharmacy Med Name: BD UF MINI PEN NEEDLE [id number]] 100 each 0    Sig: USE AS DIRECTED WITH LEVEMIR      Endocrinology: Diabetes - Testing Supplies Passed - 05/31/2024  3:12 PM      Passed - Valid encounter within last 12 months    Recent Outpatient Visits           2 months ago Cough in adult   [facility] Primary Care & Sports Medicine at [facility], Leita DEL, MD   2 months ago Essential (primary) hypertension   [facility] Primary Care & Sports Medicine at [facility], Leita DEL, MD

## 2024-06-07 ENCOUNTER — Other Ambulatory Visit: Payer: Self-pay | Admitting: Internal Medicine

## 2024-06-09 NOTE — Telephone Encounter (Signed)
 Requested medication (s) are due for refill today: yes  Requested medication (s) are on the active medication list: no  Last refill:  02/13/24  Future visit scheduled: Yes  Notes to clinic:  Unable to refill per protocol, Rx expired.Not on  current  list, possible new Rx needed.      Requested Prescriptions  Pending Prescriptions Disp Refills   BD PEN NEEDLE MINI ULTRAFINE 31G X 5 MM MISC [Pharmacy Med Name: BD UF MINI PEN NEEDLE 5MMX31G] 100 each 0    Sig: USE AS DIRECTED WITH LEVEMIR      Endocrinology: Diabetes - Testing Supplies Passed - 06/09/2024 10:28 AM      Passed - Valid encounter within last 12 months    Recent Outpatient Visits           2 months ago Cough in adult   [facility] at [facility], Leita DEL, MD   3 months ago Essential (primary) hypertension   [facility] at [facility], Leita DEL, MD

## 2024-06-28 ENCOUNTER — Other Ambulatory Visit: Payer: Self-pay | Admitting: Internal Medicine

## 2024-06-28 DIAGNOSIS — E118 Type 2 diabetes mellitus with unspecified complications: Secondary | ICD-10-CM

## 2024-06-29 NOTE — Telephone Encounter (Signed)
 Requested Prescriptions  Pending Prescriptions Disp Refills   glipiZIDE  (GLUCOTROL ) 5 MG tablet [Pharmacy Med Name: GLIPIZIDE  5 MG TABLET] 180 tablet 0    Sig: TAKE 1 TABLET BY MOUTH TWICE A DAY     Endocrinology:  Diabetes - Sulfonylureas Failed - 06/29/2024  4:08 PM      Failed - Cr in normal range and within 360 days    Creatinine  Date Value Ref Range Status  07/22/2012 0.96 0.60 - 1.30 mg/dL Final   Creatinine, Ser  Date Value Ref Range Status  03/19/2024 1.49 (H) 0.44 - 1.00 mg/dL Final         Passed - HBA1C is between 0 and 7.9 and within 180 days    Hemoglobin A1C  Date Value Ref Range Status  06/25/2022 6.4  Final   Hgb A1c MFr Bld  Date Value Ref Range Status  03/08/2024 6.2 (H) 4.8 - 5.6 % Final    Comment:             Prediabetes: 5.7 - 6.4          Diabetes: >6.4          Glycemic control for adults with diabetes: <7.0          Passed - Valid encounter within last 6 months    Recent Outpatient Visits           3 months ago Cough in adult   [facility] at [facility], Leita DEL, MD   3 months ago Essential (primary) hypertension   [facility] at [facility], Leita DEL, MD

## 2024-07-09 ENCOUNTER — Encounter: Payer: Self-pay | Admitting: Internal Medicine

## 2024-07-09 ENCOUNTER — Ambulatory Visit (INDEPENDENT_AMBULATORY_CARE_PROVIDER_SITE_OTHER): Admitting: Internal Medicine

## 2024-07-09 VITALS — BP 124/70 | HR 72 | Ht 69.0 in | Wt 183.0 lb

## 2024-07-09 DIAGNOSIS — Z794 Long term (current) use of insulin: Secondary | ICD-10-CM | POA: Diagnosis not present

## 2024-07-09 DIAGNOSIS — N1831 Chronic kidney disease, stage 3a: Secondary | ICD-10-CM

## 2024-07-09 DIAGNOSIS — I10 Essential (primary) hypertension: Secondary | ICD-10-CM | POA: Diagnosis not present

## 2024-07-09 DIAGNOSIS — E118 Type 2 diabetes mellitus with unspecified complications: Secondary | ICD-10-CM | POA: Diagnosis not present

## 2024-07-09 DIAGNOSIS — F5101 Primary insomnia: Secondary | ICD-10-CM | POA: Diagnosis not present

## 2024-07-09 MED ORDER — GLIPIZIDE 5 MG PO TABS: 5.0000 mg | ORAL_TABLET | Freq: Two times a day (BID) | ORAL | 1 refills | Status: DC

## 2024-07-09 MED ORDER — PIOGLITAZONE HCL 15 MG PO TABS: 15.0000 mg | ORAL_TABLET | Freq: Every day | ORAL | 1 refills | Status: AC

## 2024-07-09 MED ORDER — TRAZODONE HCL 50 MG PO TABS
50.0000 mg | ORAL_TABLET | Freq: Every evening | ORAL | 0 refills | Status: AC | PRN
Start: 2024-07-09 — End: ?

## 2024-07-09 NOTE — Assessment & Plan Note (Addendum)
 Monitoring GFR regularly; she is avoiding nephrotoxic agents. May not be consuming enough fluids on a daily basis and is now on PRN lasix Will continue to monitor regularly

## 2024-07-09 NOTE — Patient Instructions (Signed)
 Call [facility] to schedule your mammogram at [phone].

## 2024-07-09 NOTE — Assessment & Plan Note (Signed)
 Blood pressure is well controlled on spironolactone  and PRN lasix. No medication side effects noted. Plan to continue current medications.

## 2024-07-09 NOTE — Progress Notes (Signed)
 Date:  07/09/2024   Name:  Cathy Murphy   DOB:  1941-06-02   MRN:  [id number]   Chief Complaint: Diabetes and Hypertension  Hypertension This is a chronic problem. The problem is controlled. Pertinent negatives include no chest pain, headaches, palpitations or shortness of breath. Past treatments include diuretics.  Gastroesophageal Reflux She complains of coughing and heartburn. She reports no abdominal pain, no chest pain or no wheezing. This is a recurrent problem. The problem occurs rarely. Pertinent negatives include no fatigue. She has tried a PPI for the symptoms.  Diabetes She presents for her follow-up diabetic visit. She has type 2 diabetes mellitus. Pertinent negatives for hypoglycemia include no dizziness, headaches or nervousness/anxiousness. Pertinent negatives for diabetes include no chest pain, no fatigue and no weakness.    Review of Systems  Constitutional:  Negative for chills, fatigue and unexpected weight change.  HENT:  Negative for trouble swallowing.   Eyes:  Negative for visual disturbance.  Respiratory:  Positive for cough. Negative for chest tightness, shortness of breath and wheezing.   Cardiovascular:  Positive for leg swelling (intermittent). Negative for chest pain and palpitations.  Gastrointestinal:  Positive for heartburn. Negative for abdominal pain, constipation and diarrhea.  Musculoskeletal:  Negative for arthralgias and myalgias.  Neurological:  Negative for dizziness, weakness, light-headedness and headaches.  Psychiatric/Behavioral:  Negative for sleep disturbance. The patient is not nervous/anxious.      Lab Results  Component Value Date   NA 138 03/19/2024   K 4.1 03/19/2024   CO2 24 03/19/2024   GLUCOSE 73 03/19/2024   BUN 28 (H) 03/19/2024   CREATININE 1.49 (H) 03/19/2024   CALCIUM  9.7 03/19/2024   EGFR 34 (L) 03/08/2024   GFRNONAA 35 (L) 03/19/2024   Lab Results  Component Value Date   CHOL 152 03/08/2024   HDL 55 03/08/2024    LDLCALC 76 03/08/2024   TRIG 115 03/08/2024   CHOLHDL 2.8 03/08/2024   Lab Results  Component Value Date   TSH 2.080 03/08/2024   Lab Results  Component Value Date   HGBA1C 6.2 (H) 03/08/2024   Lab Results  Component Value Date   WBC 8.4 03/19/2024   HGB 11.7 (L) 03/19/2024   HCT 38.1 03/19/2024   MCV 94.3 03/19/2024   PLT 203 03/19/2024   Lab Results  Component Value Date   ALT 11 03/19/2024   AST 16 03/19/2024   ALKPHOS 41 03/19/2024   BILITOT 1.4 (H) 03/19/2024   Lab Results  Component Value Date   VD25OH 12.8 (L) 06/15/2015     Patient Active Problem List   Diagnosis Date Noted   History of total knee arthroplasty, 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   Asymptomatic bilateral carotid artery stenosis 11/19/2023   Vascular calcification 07/22/2023   Syncope 12/06/2021   Difficulty sleeping 12/29/2020   Mild cognitive impairment 12/29/2020   Headache disorder 12/29/2020   Stage 3a chronic kidney disease (HCC) 08/16/2020   Osteopenia determined by x-ray 08/05/2019   Atherosclerosis of abdominal aorta (HCC) 06/09/2019   Coronary artery disease involving native coronary artery of native heart 06/09/2019   BMI 24.0-24.9, adult 11/06/2018   Bilateral neck pain 10/08/2018   Aortic calcification (HCC) 03/05/2018   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   Ventral hernia without obstruction or gangrene 03/06/2016   Type II diabetes mellitus with complication (HCC) 11/09/2015   Plantar fasciitis, right 06/16/2015   Knee torn cartilage 04/26/2015   Allergic rhinitis, seasonal 04/26/2015   Essential (primary) hypertension 04/26/2015  Glaucoma 04/26/2015   Hyperlipidemia associated with type 2 diabetes mellitus (HCC) 04/26/2015   Insomnia 04/26/2015   Primary osteoarthritis of left knee 05/03/2014    Allergies  Allergen Reactions   Farxiga [Dapagliflozin] Itching    Genital yeast infection   Alprazolam Nausea Only   Aricept [Donepezil] Other (See Comments)    Leg cramps   Aspirin   Nausea And Vomiting   Metformin And Related Diarrhea   Riomet [Metformin Hcl] Nausea And Vomiting   Amoxicillin Diarrhea    IgE = 290 (WNL) on 12/11/2023   Hydrocodone  Rash   Nabumetone Rash   Skelaxin [Metaxalone] Rash   Sulfa Antibiotics Other (See Comments)    unknown  Does not remember  Does not remember    unknown  Does not remember    Past Surgical History:  Procedure Laterality Date   CATARACT EXTRACTION W/PHACO Left 10/01/2021   Procedure: CATARACT EXTRACTION PHACO AND INTRAOCULAR LENS PLACEMENT (IOC) LEFT HYDRUS MICROSTENT DIABETIC;  Surgeon: Myrna Adine Anes, MD;  Location: [facility];  Service: Ophthalmology;  Laterality: Left;  Diabetic 7.49 00:42.1   CATARACT EXTRACTION W/PHACO Right 10/15/2021   Procedure: PHACO EMULSIFICATION  with HYDRUS MICROSTENT iMPLANTATION;  Surgeon: Myrna Adine Anes, MD;  Location: [facility];  Service: Ophthalmology;  Laterality: Right;  Diabetic 4.98 00:27.3   COLONOSCOPY WITH PROPOFOL  N/A 11/25/2017   Procedure: COLONOSCOPY WITH PROPOFOL ;  Surgeon: Gaylyn Gladis PENNER, MD;  Location: [facility];  Service: Endoscopy;  Laterality: N/A;   ECTOPIC PREGNANCY SURGERY     KNEE ARTHROPLASTY Left 12/17/2023   Procedure: COMPUTER ASSISTED TOTAL KNEE ARTHROPLASTY;  Surgeon: Mardee Lynwood SQUIBB, MD;  Location: [facility];  Service: Orthopedics;  Laterality: Left;   LIPOMA RESECTION     right posterior neck   TOTAL ABDOMINAL HYSTERECTOMY W/ BILATERAL SALPINGOOPHORECTOMY      Social History   Tobacco Use   Smoking status: Former    Current packs/day: 0.00    Average packs/day: 1 pack/day for 7.0 years (7.0 ttl pk-yrs)    Types: Cigarettes    Start date: 87    Quit date: 1965    Years since quitting: 60.7   Smokeless tobacco: Never   Tobacco comments:    smoking cessation materials not required  Vaping Use   Vaping status: Never Used  Substance Use Topics   Alcohol use: No   Drug use: No     Medication list has  been reviewed and updated.  Current Meds  Medication Sig   Accu-Chek Softclix Lancets lancets Use as instructed   acetaminophen  (TYLENOL ) 500 MG tablet Take 1,500 mg by mouth every 6 (six) hours as needed for moderate pain (pain score 4-6) or mild pain (pain score 1-3).   BD PEN NEEDLE MINI ULTRAFINE 31G X 5 MM MISC USE AS DIRECTED WITH LEVEMIR    bimatoprost (LUMIGAN) 0.01 % SOLN Place 1 drop into the left eye at bedtime.   Black Elderberry 1000 MG CAPS Take 1,000 mg by mouth daily.   Cholecalciferol (VITAMIN D ) 50 MCG (2000 UT) tablet Take 2,000 Units by mouth daily.   Difluprednate  0.05 % EMUL Place 1 drop into the right eye 2 (two) times daily.   dorzolamide -timolol  (COSOPT ) 2-0.5 % ophthalmic solution Place 1 drop into both eyes 2 (two) times daily.   ezetimibe  (ZETIA ) 10 MG tablet TAKE 1 TABLET BY MOUTH EVERY DAY   glucose blood (ONETOUCH ULTRA) test strip USE TO TEST BLOOD SUGAR UP TO TWICE DAILY.   hydrOXYzine  (ATARAX ) 25 MG  tablet TAKE 0.5-1 TABLETS (12.5-25 MG TOTAL) BY MOUTH EVERY 8 (EIGHT) HOURS AS NEEDED FOR ANXIETY.   insulin  glargine (LANTUS  SOLOSTAR) 100 UNIT/ML Solostar Pen INJECT 30 UNITS INTO THE SKIN EVERY MORNING ONLY - NO EVENING DOSE   omeprazole  (PRILOSEC) 20 MG capsule TAKE 1 CAPSULE BY MOUTH EVERY DAY   rosuvastatin  (CRESTOR ) 40 MG tablet Take 1 tablet (40 mg total) by mouth at bedtime.   sitaGLIPtin (JANUVIA) 100 MG tablet TAKE 1 TABLET BY MOUTH EVERY DAY   spironolactone  (ALDACTONE ) 25 MG tablet Take 1 tablet (25 mg total) by mouth daily.   [DISCONTINUED] glipiZIDE  (GLUCOTROL ) 5 MG tablet TAKE 1 TABLET BY MOUTH TWICE A DAY   [DISCONTINUED] pioglitazone  (ACTOS ) 15 MG tablet TAKE 1 TABLET BY MOUTH EVERY DAY   [DISCONTINUED] traZODone  (DESYREL ) 50 MG tablet TAKE 1 TABLET BY MOUTH EVERYDAY AT BEDTIME       03/08/2024    9:59 AM 07/22/2023   11:02 AM 03/14/2023   10:10 AM 11/13/2022    9:55 AM  GAD 7 : Generalized Anxiety Score  Nervous, Anxious, on Edge 1 2 1  0   Control/stop worrying 0 2 1 0  Worry too much - different things 1 2 1  0  Trouble relaxing 1 3 1  0  Restless 1 3 1  0  Easily annoyed or irritable 1 2 0 0  Afraid - awful might happen 1 2 0 0  Total GAD 7 Score 6 16 5  0  Anxiety Difficulty Not difficult at all Not difficult at all Not difficult at all Not difficult at all       03/08/2024    9:59 AM 07/28/2023   10:16 AM 07/23/2023   11:30 AM  Depression screen PHQ 2/9  Decreased Interest 1 0 0  Down, Depressed, Hopeless 1 1 0  PHQ - 2 Score 2 1 0  Altered sleeping 0 1 0  Tired, decreased energy 1 1 0  Change in appetite 0 0 0  Feeling bad or failure about yourself  0 0 0  Trouble concentrating 1 0 0  Moving slowly or fidgety/restless 0 0 0  Suicidal thoughts 0 0 0  PHQ-9 Score 4 3 0  Difficult doing work/chores Not difficult at all  Not difficult at all    BP Readings from Last 3 Encounters:  07/09/24 124/70  03/26/24 118/62  03/19/24 (!) 165/71    Physical Exam Vitals and nursing note reviewed.  Constitutional:      General: She is not in acute distress.    Appearance: Normal appearance. She is well-developed.  HENT:     Head: Normocephalic and atraumatic.  Cardiovascular:     Rate and Rhythm: Normal rate and regular rhythm.     Heart sounds: No murmur heard. Pulmonary:     Effort: Pulmonary effort is normal. No respiratory distress.     Breath sounds: No wheezing or rhonchi.  Musculoskeletal:     Cervical back: Normal range of motion.     Right lower leg: No edema.     Left lower leg: No edema.  Lymphadenopathy:     Cervical: No cervical adenopathy.  Skin:    General: Skin is warm and dry.     Findings: No rash.  Neurological:     General: No focal deficit present.     Mental Status: She is alert and oriented to person, place, and time.  Psychiatric:        Mood and Affect: Mood normal.  Behavior: Behavior normal.     Wt Readings from Last 3 Encounters:  07/09/24 183 lb (83 kg)  03/26/24 180  lb 6 oz (81.8 kg)  03/19/24 183 lb (83 kg)    BP 124/70   Pulse 72   Ht 5' 9 (1.753 m)   Wt 183 lb (83 kg)   SpO2 98%   BMI 27.02 kg/m   Assessment and Plan:  Problem List Items Addressed This Visit       Unprioritized   Essential (primary) hypertension (Chronic)   Blood pressure is well controlled on spironolactone  and PRN lasix. No medication side effects noted. Plan to continue current medications.       Relevant Medications   furosemide (LASIX) 20 MG tablet   Type II diabetes mellitus with complication (HCC) - Primary (Chronic)   Blood sugars have been stable.  No hypoglycemic events since last visit. Currently medications are Actos , glipizide , Januvia and Lantus  once a day. Last visit medical regimen changes were none. Lab Results  Component Value Date   HGBA1C 6.2 (H) 03/08/2024  Will check A1C today.       Relevant Medications   pioglitazone  (ACTOS ) 15 MG tablet   glipiZIDE  (GLUCOTROL ) 5 MG tablet   Other Relevant Orders   Basic metabolic panel with GFR   Hemoglobin A1c   Stage 3a chronic kidney disease (HCC) (Chronic)   Monitoring GFR regularly; she is avoiding nephrotoxic agents. May not be consuming enough fluids on a daily basis and is now on PRN lasix Will continue to monitor regularly      Insomnia   Relevant Medications   traZODone  (DESYREL ) 50 MG tablet   Other Visit Diagnoses       Long-term insulin  use (HCC)           Return in about 4 months (around 11/08/2024) for DM, HTN  TOC Dr. Lemon.    Leita HILARIO Adie, MD [facility] [place]

## 2024-07-09 NOTE — Assessment & Plan Note (Signed)
 Blood sugars have been stable.  No hypoglycemic events since last visit. Currently medications are Actos , glipizide , Januvia and Lantus  once a day. Last visit medical regimen changes were none. Lab Results  Component Value Date   HGBA1C 6.2 (H) 03/08/2024  Will check A1C today.

## 2024-07-10 LAB — BASIC METABOLIC PANEL WITH GFR
BUN/Creatinine Ratio: 13 (ref 12–28)
BUN: 17 mg/dL (ref 8–27)
CO2: 21 mmol/L (ref 20–29)
Calcium: 9.6 mg/dL (ref 8.7–10.3)
Chloride: 103 mmol/L (ref 96–106)
Creatinine, Ser: 1.28 mg/dL — ABNORMAL HIGH (ref 0.57–1.00)
Glucose: 61 mg/dL — ABNORMAL LOW (ref 70–99)
Potassium: 4.5 mmol/L (ref 3.5–5.2)
Sodium: 143 mmol/L (ref 134–144)
eGFR: 42 mL/min/1.73 — ABNORMAL LOW (ref >59)

## 2024-07-10 LAB — HEMOGLOBIN A1C
Est. average glucose Bld gHb Est-mCnc: 169 mg/dL
Hgb A1c MFr Bld: 7.5 % — ABNORMAL HIGH (ref 4.8–5.6)

## 2024-07-11 ENCOUNTER — Ambulatory Visit: Payer: Self-pay | Admitting: Internal Medicine

## 2024-07-28 ENCOUNTER — Ambulatory Visit (INDEPENDENT_AMBULATORY_CARE_PROVIDER_SITE_OTHER): Payer: Self-pay

## 2024-07-28 VITALS — Ht 69.0 in | Wt 183.0 lb

## 2024-07-28 DIAGNOSIS — Z0000 Encounter for general adult medical examination without abnormal findings: Secondary | ICD-10-CM

## 2024-07-28 NOTE — Progress Notes (Signed)
 Subjective:   Cathy Murphy is a 83 y.o. who presents for a Medicare Wellness preventive visit.  As a reminder, Annual Wellness Visits don't include a physical exam, and some assessments may be limited, especially if this visit is performed virtually. We may recommend an in-person follow-up visit with your provider if needed.  Visit Complete: Virtual I connected with  Inocente FORBES Ill on 07/28/24 by a audio enabled telemedicine application and verified that I am speaking with the correct person using two identifiers.  Patient Location: Home  Provider Location: Home Office  I discussed the limitations of evaluation and management by telemedicine. The patient expressed understanding and agreed to proceed.  Vital Signs: Because this visit was a virtual/telehealth visit, some criteria may be missing or patient reported. Any vitals not documented were not able to be obtained and vitals that have been documented are patient reported.  VideoDeclined- This patient declined Librarian, academic. Therefore the visit was completed with audio only.  Persons Participating in Visit: Patient.  AWV Questionnaire: No: Patient Medicare AWV questionnaire was not completed prior to this visit.  Cardiac Risk Factors include: advanced age (>9men, >13 women);diabetes mellitus;dyslipidemia;hypertension     Objective:    Today's Vitals   07/28/24 0924  Weight: 183 lb (83 kg)  Height: 5' 9 (1.753 m)   Body mass index is 27.02 kg/m.     07/28/2024    9:37 AM 03/19/2024   11:51 AM 03/19/2024   10:25 AM 12/17/2023   10:33 AM 12/11/2023   11:29 AM 07/23/2023   11:31 AM 07/16/2023    5:24 PM  Advanced Directives  Does Patient Have a Medical Advance Directive? No No No No No No No  Would patient like information on creating a medical advance directive? Yes (MAU/Ambulatory/Procedural Areas - Information given) No - Patient declined  No - Patient declined  No - Patient declined      Current Medications (verified) Outpatient Encounter Medications as of 07/28/2024  Medication Sig   Accu-Chek Softclix Lancets lancets Use as instructed   acetaminophen  (TYLENOL ) 500 MG tablet Take 1,500 mg by mouth every 6 (six) hours as needed for moderate pain (pain score 4-6) or mild pain (pain score 1-3).   BD PEN NEEDLE MINI ULTRAFINE 31G X 5 MM MISC USE AS DIRECTED WITH LEVEMIR    bimatoprost (LUMIGAN) 0.01 % SOLN Place 1 drop into the left eye at bedtime.   Black Elderberry 1000 MG CAPS Take 1,000 mg by mouth daily.   Cholecalciferol (VITAMIN D ) 50 MCG (2000 UT) tablet Take 2,000 Units by mouth daily.   Difluprednate  0.05 % EMUL Place 1 drop into the right eye 2 (two) times daily.   dorzolamide -timolol  (COSOPT ) 2-0.5 % ophthalmic solution Place 1 drop into both eyes 2 (two) times daily.   ezetimibe  (ZETIA ) 10 MG tablet TAKE 1 TABLET BY MOUTH EVERY DAY   furosemide (LASIX) 20 MG tablet Take 20 mg by mouth daily.   glipiZIDE  (GLUCOTROL ) 5 MG tablet Take 1 tablet (5 mg total) by mouth 2 (two) times daily. (Patient taking differently: Take 5 mg by mouth 2 (two) times daily. Taking 1 tablet daily)   glucose blood (ONETOUCH ULTRA) test strip USE TO TEST BLOOD SUGAR UP TO TWICE DAILY.   hydrOXYzine  (ATARAX ) 25 MG tablet TAKE 0.5-1 TABLETS (12.5-25 MG TOTAL) BY MOUTH EVERY 8 (EIGHT) HOURS AS NEEDED FOR ANXIETY.   insulin  glargine (LANTUS  SOLOSTAR) 100 UNIT/ML Solostar Pen INJECT 30 UNITS INTO THE SKIN EVERY MORNING  ONLY - NO EVENING DOSE   omeprazole  (PRILOSEC) 20 MG capsule TAKE 1 CAPSULE BY MOUTH EVERY DAY   pioglitazone  (ACTOS ) 15 MG tablet Take 1 tablet (15 mg total) by mouth daily.   rosuvastatin  (CRESTOR ) 40 MG tablet Take 1 tablet (40 mg total) by mouth at bedtime.   sitaGLIPtin (JANUVIA) 100 MG tablet TAKE 1 TABLET BY MOUTH EVERY DAY   spironolactone  (ALDACTONE ) 25 MG tablet Take 1 tablet (25 mg total) by mouth daily.   traZODone  (DESYREL ) 50 MG tablet Take 1 tablet (50 mg total) by  mouth at bedtime as needed for sleep.   [DISCONTINUED] albuterol  (PROAIR  HFA) 108 (90 Base) MCG/ACT inhaler Inhale 2 puffs into the lungs every 6 (six) hours as needed for wheezing or shortness of breath.   No facility-administered encounter medications on file as of 07/28/2024.    Allergies (verified) Farxiga [dapagliflozin], Alprazolam, Aricept [donepezil], Aspirin , Metformin and related, Riomet [metformin hcl], Amoxicillin, Hydrocodone , Nabumetone, Skelaxin [metaxalone], and Sulfa antibiotics   History: Past Medical History:  Diagnosis Date   Allergy    Anxiety    Atherosclerosis of abdominal aorta    Bilateral carotid artery disease    Bilateral lower extremity edema    Cervicalgia    Childhood asthma    Colon polyp    Compression fracture of T11 vertebra (HCC)    Coronary artery disease involving native coronary artery of native heart without angina pectoris    a.) cMRI 08/28/2010: no ischemia/infarct/scar/infiltration; b.) MV 10/20/2023: no ischemia   Diastolic dysfunction    a.) TTE 05/14/2023: EF >55%, no RWMAs, G1DD, mild MAC, triv MR, mild TR/PR   Essential hypertension    Frequent falls    Glaucoma    Headache disorder    History of 2019 novel coronavirus disease (COVID-19) 09/08/2021   History of bilateral cataract extraction 2022   Hyperlipidemia    Insomnia    a.) takes trazodone  PRN   Memory loss    Osteoarthritis    Osteopenia    Stage 3a chronic kidney disease (CKD) (HCC)    Syncope    Type 2 diabetes mellitus with complication [facility])    Past Surgical History:  Procedure Laterality Date   CATARACT EXTRACTION W/PHACO Left 10/01/2021   Procedure: CATARACT EXTRACTION PHACO AND INTRAOCULAR LENS PLACEMENT (IOC) LEFT HYDRUS MICROSTENT DIABETIC;  Surgeon: Myrna Adine Anes, MD;  Location: [facility];  Service: Ophthalmology;  Laterality: Left;  Diabetic 7.49 00:42.1   CATARACT EXTRACTION W/PHACO Right 10/15/2021   Procedure: PHACO EMULSIFICATION  with  HYDRUS MICROSTENT iMPLANTATION;  Surgeon: Myrna Adine Anes, MD;  Location: [facility];  Service: Ophthalmology;  Laterality: Right;  Diabetic 4.98 00:27.3   COLONOSCOPY WITH PROPOFOL  N/A 11/25/2017   Procedure: COLONOSCOPY WITH PROPOFOL ;  Surgeon: Gaylyn Gladis PENNER, MD;  Location: [facility];  Service: Endoscopy;  Laterality: N/A;   ECTOPIC PREGNANCY SURGERY     KNEE ARTHROPLASTY Left 12/17/2023   Procedure: COMPUTER ASSISTED TOTAL KNEE ARTHROPLASTY;  Surgeon: Mardee Lynwood SQUIBB, MD;  Location: [facility] OR[facility];  Service: Orthopedics;  Laterality: Left;   LIPOMA RESECTION     right posterior neck   TOTAL ABDOMINAL HYSTERECTOMY W/ BILATERAL SALPINGOOPHORECTOMY     Family History  Problem Relation Age of Onset   Cancer Mother        Breast   Breast cancer Mother    Heart disease Father    Heart attack Brother    Cancer Maternal Aunt        breast  Breast cancer Maternal Aunt    Breast cancer Cousin        mat cousin   Social History   Socioeconomic History   Marital status: Widowed    Spouse name: Not on file   Number of children: 1   Years of education: Not on file   Highest education level: 12th grade  Occupational History    Employer: [employer]  Tobacco Use   Smoking status: Former    Current packs/day: 0.00    Average packs/day: 1 pack/day for 7.0 years (7.0 ttl pk-yrs)    Types: Cigarettes    Start date: 41    Quit date: 1965    Years since quitting: 60.7   Smokeless tobacco: Never   Tobacco comments:    smoking cessation materials not required  Vaping Use   Vaping status: Never Used  Substance and Sexual Activity   Alcohol use: No   Drug use: No   Sexual activity: Not Currently  Other Topics Concern   Not on file  Social History Narrative   Pt lives alone.   Social Drivers of Corporate investment banker Strain: Low Risk  (07/28/2024)   Overall Financial Resource Strain (CARDIA)    Difficulty of Paying Living Expenses: Not hard at all  Food  Insecurity: No Food Insecurity (07/28/2024)   Hunger Vital Sign    Worried About Running Out of Food in the Last Year: Never true    Ran Out of Food in the Last Year: Never true  Transportation Needs: No Transportation Needs (07/28/2024)   PRAPARE - Administrator, Civil Service (Medical): No    Lack of Transportation (Non-Medical): No  Physical Activity: Insufficiently Active (07/28/2024)   Exercise Vital Sign    Days of Exercise per Week: 3 days    Minutes of Exercise per Session: 30 min  Stress: No Stress Concern Present (07/28/2024)   [employer] of Occupational Health - Occupational Stress Questionnaire    Feeling of Stress: Not at all  Social Connections: Moderately Isolated (07/28/2024)   Social Connection and Isolation Panel    Frequency of Communication with Friends and Family: More than three times a week    Frequency of Social Gatherings with Friends and Family: Three times a week    Attends Religious Services: More than 4 times per year    Active Member of Clubs or Organizations: No    Attends Banker Meetings: Never    Marital Status: Widowed    Tobacco Counseling Counseling given: Not Answered Tobacco comments: smoking cessation materials not required    Clinical Intake:  Pre-visit preparation completed: Yes  Pain : No/denies pain     BMI - recorded: 27.02 Nutritional Status: BMI 25 -29 Overweight Nutritional Risks: None Diabetes: Yes CBG done?: No Did pt. bring in CBG monitor from home?: No  Lab Results  Component Value Date   HGBA1C 7.5 (H) 07/09/2024   HGBA1C 6.2 (H) 03/08/2024   HGBA1C 6.8 (H) 12/11/2023     How often do you need to have someone help you when you read instructions, pamphlets, or other written materials from your doctor or pharmacy?: 1 - Never  Interpreter Needed?: No  Information entered by :: Vina Ned, CMA   Activities of Daily Living     07/28/2024    9:26 AM 12/17/2023    3:51 PM  In your  present state of health, do you have any difficulty performing the following activities:  Hearing? 0 0  Vision? 0 0  Difficulty concentrating or making decisions? 1 0  Comment mild cognitive impairment   Walking or climbing stairs? 1   Comment cane   Dressing or bathing? 0   Doing errands, shopping? 0   Preparing Food and eating ? N   Using the Toilet? N   In the past six months, have you accidently leaked urine? N   Do you have problems with loss of bowel control? N   Managing your Medications? N   Managing your Finances? N   Housekeeping or managing your Housekeeping? N     Patient Care Team: Justus Leita DEL, MD as PCP - General (Internal Medicine) Hester Wolm PARAS, MD as Consulting Physician (Cardiology) Dodson Delon FERNS, MD as Referring Physician (Physical Medicine and Rehabilitation) Verlinda Boas, PA-C (Orthopedic Surgery) Ermalinda Lenn HERO, LCSW as Social Worker Shirley Wendy CROME, MD as Referring Physician (Ophthalmology)  I have updated your Care Teams any recent Medical Services you may have received from other providers in the past year.     Assessment:   This is a routine wellness examination for [place].  Hearing/Vision screen Hearing Screening - Comments:: Denies hearing loss  Vision Screening - Comments:: Gets DM eye exams, Dr. Wendy Shirley, [place], [place]   Goals Addressed             This Visit's Progress    Patient Stated       Get knee and eye well       Depression Screen     07/28/2024    9:34 AM 07/09/2024    1:22 PM 03/08/2024    9:59 AM 07/28/2023   10:16 AM 07/23/2023   11:30 AM 07/22/2023   11:02 AM 03/14/2023   10:10 AM  PHQ 2/9 Scores  PHQ - 2 Score 0 1 2 1  0 1 2  PHQ- 9 Score 1 2 4 3  0 11 9    Fall Risk     07/28/2024    9:39 AM 07/09/2024    1:22 PM 03/08/2024    9:59 AM 07/28/2023   10:08 AM 07/23/2023   11:32 AM  Fall Risk   Falls in the past year? 0 0 0 1 1  Number falls in past yr: 0 0 0 1 1  Injury with Fall? 0 0 0 1 1   Risk for fall due to : History of fall(s);Impaired balance/gait;Orthopedic patient;Impaired mobility No Fall Risks No Fall Risks History of fall(s);Impaired balance/gait;Impaired mobility History of fall(s)  Follow up Falls evaluation completed;Education provided Falls evaluation completed Falls evaluation completed  Falls prevention discussed;Falls evaluation completed    MEDICARE RISK AT HOME:  Medicare Risk at Home Any stairs in or around the home?: Yes If so, are there any without handrails?: No Home free of loose throw rugs in walkways, pet beds, electrical cords, etc?: Yes Adequate lighting in your home to reduce risk of falls?: Yes Life alert?: Yes Use of a cane, walker or w/c?: Yes (cane) Grab bars in the bathroom?: No Shower chair or bench in shower?: Yes Elevated toilet seat or a handicapped toilet?: Yes  TIMED UP AND GO:  Was the test performed?  No  Cognitive Function: 6CIT completed        07/28/2024    9:40 AM 07/23/2023   11:34 AM 07/17/2022   10:04 AM 12/11/2020    2:10 PM 07/12/2020   10:19 AM  6CIT Screen  What Year? 0 points 0 points 0 points 0 points 0 points  What month? 0 points 0 points 0 points 0 points 0 points  What time? 0 points 0 points 0 points 0 points 0 points  Count back from 20 0 points 0 points 0 points 4 points 0 points  Months in reverse 4 points 4 points 4 points 2 points 0 points  Repeat phrase 6 points 4 points 4 points 4 points 0 points  Total Score 10 points 8 points 8 points 10 points 0 points    Immunizations Immunization History  Administered Date(s) Administered   Influenza-Unspecified 03/06/2016   Moderna Sars-Covid-2 Vaccination 01/01/2020, 01/29/2020, 10/31/2020   Td 03/05/2011    Screening Tests Health Maintenance  Topic Date Due   Pneumococcal Vaccine: 50+ Years (1 of 2 - PCV) Never done   Zoster Vaccines- Shingrix (1 of 2) Never done   DTaP/Tdap/Td (2 - Tdap) 03/04/2021   Mammogram  10/15/2023   FOOT EXAM   03/13/2024   COVID-19 Vaccine (4 - 2025-26 season) 07/05/2024   Influenza Vaccine  02/01/2025 (Originally 06/04/2024)   DEXA SCAN  08/03/2024   OPHTHALMOLOGY EXAM  11/18/2024   HEMOGLOBIN A1C  01/06/2025   Diabetic kidney evaluation - Urine ACR  03/08/2025   Diabetic kidney evaluation - eGFR measurement  07/09/2025   Medicare Annual Wellness (AWV)  07/28/2025   HPV VACCINES  Aged Out   Meningococcal B Vaccine  Aged Out   Colonoscopy  Discontinued   Hepatitis C Screening  Discontinued    Health Maintenance Items Addressed: See Nurse Notes at the end of this note  Additional Screening:  Vision Screening: Recommended annual ophthalmology exams for early detection of glaucoma and other disorders of the eye. Is the patient up to date with their annual eye exam?  Yes  Who is the provider or what is the name of the office in which the patient attends annual eye exams? Dr. Wendy Lucks, [place], [place]  Dental Screening: Recommended annual dental exams for proper oral hygiene  Community Resource Referral / Chronic Care Management: CRR required this visit?  No   CCM required this visit?  No   Plan:    I have personally reviewed and noted the following in the patient's chart:   Medical and social history Use of alcohol, tobacco or illicit drugs  Current medications and supplements including opioid prescriptions. Patient is not currently taking opioid prescriptions. Functional ability and status Nutritional status Physical activity Advanced directives List of other physicians Hospitalizations, surgeries, and ER visits in previous 12 months Vitals Screenings to include cognitive, depression, and falls Referrals and appointments  In addition, I have reviewed and discussed with patient certain preventive protocols, quality metrics, and best practice recommendations. A written personalized care plan for preventive services as well as general preventive health recommendations were  provided to patient.   Vina Ned, CMA   07/28/2024   After Visit Summary: (Mail) Due to this being a telephonic visit, the after visit summary with patients personalized plan was offered to patient via mail   Notes:  6 CIT Score - 10 Needs DM foot exam at next OV (11/08/24) Included ph# to schedule MMG in [facility] Declined DEXA scan Colonoscopy no longer recommended due to age Declined DM & Nutrition education referral Declined flu, Tdap, pneumonia and shingles vaccines

## 2024-07-28 NOTE — Patient Instructions (Signed)
 Cathy Murphy,  Thank you for taking the time for your Medicare Wellness Visit. I appreciate your continued commitment to your health goals. Please review the care plan we discussed, and feel free to reach out if I can assist you further.  Medicare recommends these wellness visits once per year to help you and your care team stay ahead of potential health issues. These visits are designed to focus on prevention, allowing your provider to concentrate on managing your acute and chronic conditions during your regular appointments.  Please note that Annual Wellness Visits do not include a physical exam. Some assessments may be limited, especially if the visit was conducted virtually. If needed, we may recommend a separate in-person follow-up with your provider.  Ongoing Care Seeing your primary care provider every 3 to 6 months helps us  monitor your health and provide consistent, personalized care.   Referrals If a referral was made during today's visit and you haven't received any updates within two weeks, please contact the referred provider directly to check on the status.  Recommended Screenings:   Please call to schedule your mammogram:  [facility] Address: [address], [place], [place] Phone: [phone]  [facility] at [facility] [address] [place], [place] [address] Phone: [phone]    Health Maintenance  Topic Date Due   Pneumococcal Vaccine for age over 17 (1 of 2 - PCV) Never done   Zoster (Shingles) Vaccine (1 of 2) Never done   DTaP/Tdap/Td vaccine (2 - Tdap) 03/04/2021   Breast Cancer Screening  10/15/2023   Complete foot exam   03/13/2024   COVID-19 Vaccine (4 - 2025-26 season) 07/05/2024   Flu Shot  02/01/2025*   DEXA scan (bone density measurement)  08/03/2024   Eye exam for diabetics  11/18/2024   Hemoglobin A1C  01/06/2025   Yearly kidney health urinalysis for diabetes  03/08/2025    Yearly kidney function blood test for diabetes  07/09/2025   Medicare Annual Wellness Visit  07/28/2025   HPV Vaccine  Aged Out   Meningitis B Vaccine  Aged Out   Colon Cancer Screening  Discontinued   Hepatitis C Screening  Discontinued  *Topic was postponed. The date shown is not the original due date.       07/28/2024    9:37 AM  Advanced Directives  Does Patient Have a Medical Advance Directive? No  Would patient like information on creating a medical advance directive? Yes (MAU/Ambulatory/Procedural Areas - Information given)   Advance Care Planning is important because it: Ensures you receive medical care that aligns with your values, goals, and preferences. Provides guidance to your family and loved ones, reducing the emotional burden of decision-making during critical moments.  Vision: Annual vision screenings are recommended for early detection of glaucoma, cataracts, and diabetic retinopathy. These exams can also reveal signs of chronic conditions such as diabetes and high blood pressure.  Dental: Annual dental screenings help detect early signs of oral cancer, gum disease, and other conditions linked to overall health, including heart disease and diabetes.  Please see the attached documents for additional preventive care recommendations.   Fall Prevention in the Home, Adult Falls can cause injuries and affect people of all ages. There are many simple things that you can do to make your home safe and to help prevent falls. If you need it, ask for help making these changes. What actions can I take to prevent falls? General information Use good  lighting in all rooms. Make sure to: Replace any light bulbs that burn out. Turn on lights if it is dark and use night-lights. Keep items that you use often in easy-to-reach places. Lower the shelves around your home if needed. Move furniture so that there are clear paths around it. Do not keep throw rugs or other things on the floor  that can make you trip. If any of your floors are uneven, fix them. Add color or contrast paint or tape to clearly mark and help you see: Grab bars or handrails. First and last steps of staircases. Where the edge of each step is. If you use a ladder or stepladder: Make sure that it is fully opened. Do not climb a closed ladder. Make sure the sides of the ladder are locked in place. Have someone hold the ladder while you use it. Know where your pets are as you move through your home. What can I do in the bathroom?     Keep the floor dry. Clean up any water that is on the floor right away. Remove soap buildup in the bathtub or shower. Buildup makes bathtubs and showers slippery. Use non-skid mats or decals on the floor of the bathtub or shower. Attach bath mats securely with double-sided, non-slip rug tape. If you need to sit down while you are in the shower, use a non-slip stool. Install grab bars by the toilet and in the bathtub and shower. Do not use towel bars as grab bars. What can I do in the bedroom? Make sure that you have a light by your bed that is easy to reach. Do not use any sheets or blankets on your bed that hang to the floor. Have a firm bench or chair with side arms that you can use for support when you get dressed. What can I do in the kitchen? Clean up any spills right away. If you need to reach something above you, use a sturdy step stool that has a grab bar. Keep electrical cables out of the way. Do not use floor polish or wax that makes floors slippery. What can I do with my stairs? Do not leave anything on the stairs. Make sure that you have a light switch at the top and the bottom of the stairs. Have them installed if you do not have them. Make sure that there are handrails on both sides of the stairs. Fix handrails that are broken or loose. Make sure that handrails are as long as the staircases. Install non-slip stair treads on all stairs in your home if they  do not have carpet. Avoid having throw rugs at the top or bottom of stairs, or secure the rugs with carpet tape to prevent them from moving. Choose a carpet design that does not hide the edge of steps on the stairs. Make sure that carpet is firmly attached to the stairs. Fix any carpet that is loose or worn. What can I do on the outside of my home? Use bright outdoor lighting. Repair the edges of walkways and driveways and fix any cracks. Clear paths of anything that can make you trip, such as tools or rocks. Add color or contrast paint or tape to clearly mark and help you see high doorway thresholds. Trim any bushes or trees on the main path into your home. Check that handrails are securely fastened and in good repair. Both sides of all steps should have handrails. Install guardrails along the edges of any raised decks or  porches. Have leaves, snow, and ice cleared regularly. Use sand, salt, or ice melt on walkways during winter months if you live where there is ice and snow. In the garage, clean up any spills right away, including grease or oil spills. What other actions can I take? Review your medicines with your health care provider. Some medicines can make you confused or feel dizzy. This can increase your chance of falling. Wear closed-toe shoes that fit well and support your feet. Wear shoes that have rubber soles and low heels. Use a cane, walker, scooter, or crutches that help you move around if needed. Talk with your provider about other ways that you can decrease your risk of falls. This may include seeing a physical therapist to learn to do exercises to improve movement and strength. Where to find more information Centers for Disease Control and Prevention, STEADI: TonerPromos.no [employer] on Aging: BaseRingTones.pl National Institute on Aging: BaseRingTones.pl Contact a health care provider if: You are afraid of falling at home. You feel weak, drowsy, or dizzy at home. You fall at  home. Get help right away if you: Lose consciousness or have trouble moving after a fall. Have a fall that causes a head injury. These symptoms may be an emergency. Get help right away. Call 911. Do not wait to see if the symptoms will go away. Do not drive yourself to the hospital. This information is not intended to replace advice given to you by your health care provider. Make sure you discuss any questions you have with your health care provider. Document Revised: 06/24/2022 Document Reviewed: 06/24/2022 Elsevier Patient Education  2024 [employer].

## 2024-08-09 ENCOUNTER — Ambulatory Visit
Admission: RE | Admit: 2024-08-09 | Discharge: 2024-08-09 | Disposition: A | Discharge status: Home / Self Care | Source: Ambulatory Visit | Attending: Internal Medicine | Admitting: Internal Medicine

## 2024-08-09 DIAGNOSIS — Z1231 Encounter for screening mammogram for malignant neoplasm of breast: Secondary | ICD-10-CM | POA: Diagnosis not present

## 2024-08-15 ENCOUNTER — Other Ambulatory Visit: Payer: Self-pay | Admitting: Internal Medicine

## 2024-08-15 DIAGNOSIS — H6123 Impacted cerumen, bilateral: Secondary | ICD-10-CM

## 2024-08-17 NOTE — Telephone Encounter (Signed)
 Requested Prescriptions  Pending Prescriptions Disp Refills   omeprazole  (PRILOSEC) 20 MG capsule [Pharmacy Med Name: OMEPRAZOLE  DR 20 MG CAPSULE] 90 capsule 0    Sig: TAKE 1 CAPSULE BY MOUTH EVERY DAY     Gastroenterology: Proton Pump Inhibitors Passed - 08/17/2024  1:54 PM      Passed - Valid encounter within last 12 months    Recent Outpatient Visits           1 month ago Type II diabetes mellitus with complication [facility])   [facility] at [facility], Leita DEL, MD   4 months ago Cough in adult   [facility] at [facility], Leita DEL, MD   5 months ago Essential (primary) hypertension   [facility] at [facility], Leita DEL, MD

## 2024-08-30 DIAGNOSIS — T8522XD Displacement of intraocular lens, subsequent encounter: Secondary | ICD-10-CM | POA: Diagnosis not present

## 2024-08-30 DIAGNOSIS — H40043 Steroid responder, bilateral: Secondary | ICD-10-CM | POA: Diagnosis not present

## 2024-08-30 DIAGNOSIS — E113392 Type 2 diabetes mellitus with moderate nonproliferative diabetic retinopathy without macular edema, left eye: Secondary | ICD-10-CM | POA: Diagnosis not present

## 2024-08-30 DIAGNOSIS — H35351 Cystoid macular degeneration, right eye: Secondary | ICD-10-CM | POA: Diagnosis not present

## 2024-09-03 ENCOUNTER — Encounter: Payer: Self-pay | Admitting: Emergency Medicine

## 2024-09-03 ENCOUNTER — Ambulatory Visit
Admission: EM | Admit: 2024-09-03 | Discharge: 2024-09-03 | Disposition: A | Discharge status: Home / Self Care | Attending: Emergency Medicine | Admitting: Emergency Medicine

## 2024-09-03 DIAGNOSIS — L959 Vasculitis limited to the skin, unspecified: Secondary | ICD-10-CM

## 2024-09-03 MED ORDER — PREDNISONE 10 MG (21) PO TBPK: ORAL_TABLET | ORAL | 0 refills | Status: DC

## 2024-09-03 NOTE — ED Triage Notes (Signed)
 Patient c/o itchy burning rash on her left leg that started a week ago.  Patient denies fevers.

## 2024-09-03 NOTE — Discharge Instructions (Addendum)
 As we discussed, your rash is more consistent with vasculitis, which is an inflammation of blood vessels.  In this case, the superficial blood vessels in your left lower extremity.  Take the prednisone  according to the package instructions for the next 6 days to decrease inflammation and aid in pain relief.  You may also use over-the-counter antihistamine such as Claritin, Zyrtec, Allegra in the day and take Benadryl  at night for the itching.  I have also referred you to dermatology for further evaluation should your rash not improve.

## 2024-09-03 NOTE — ED Provider Notes (Signed)
 [facility]    CSN: [id number] Arrival date & time: 09/03/24  1122      History   Chief Complaint Chief Complaint  Patient presents with   Rash    HPI Cathy Murphy is a 83 y.o. female.   HPI  83 year old female with past medical history significant for essential hypertension, hyperlipidemia, type 2 diabetes, atherosclerosis, CAD, stage III chronic kidney disease, glaucoma, presents for evaluation of 1 week worth of an itching burning rash on the inner aspect of her right lower extremity.  She reports that it is worse at night.  Past Medical History:  Diagnosis Date   Allergy    Anxiety    Atherosclerosis of abdominal aorta    Bilateral carotid artery disease    Bilateral lower extremity edema    Cervicalgia    Childhood asthma    Colon polyp    Compression fracture of T11 vertebra (HCC)    Coronary artery disease involving native coronary artery of native heart without angina pectoris    a.) cMRI 08/28/2010: no ischemia/infarct/scar/infiltration; b.) MV 10/20/2023: no ischemia   Diastolic dysfunction    a.) TTE 05/14/2023: EF >55%, no RWMAs, G1DD, mild MAC, triv MR, mild TR/PR   Essential hypertension    Frequent falls    Glaucoma    Headache disorder    History of 2019 novel coronavirus disease (COVID-19) 09/08/2021   History of bilateral cataract extraction 2022   Hyperlipidemia    Insomnia    a.) takes trazodone  PRN   Memory loss    Osteoarthritis    Osteopenia    Stage 3a chronic kidney disease (CKD) (HCC)    Syncope    Type 2 diabetes mellitus with complication [facility])     Patient Active Problem List   Diagnosis Date Noted   History of total knee arthroplasty, 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   Asymptomatic bilateral carotid artery stenosis 11/19/2023   Vascular calcification 07/22/2023   Syncope 12/06/2021   Difficulty sleeping 12/29/2020   Mild cognitive impairment 12/29/2020   Headache disorder 12/29/2020   Stage 3a chronic kidney disease (HCC)  08/16/2020   Osteopenia determined by x-ray 08/05/2019   Atherosclerosis of abdominal aorta 06/09/2019   Coronary artery disease involving native coronary artery of native heart 06/09/2019   BMI 24.0-24.9, adult 11/06/2018   Bilateral neck pain 10/08/2018   Aortic calcification 03/05/2018   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   Ventral hernia without obstruction or gangrene 03/06/2016   Type II diabetes mellitus with complication (HCC) 11/09/2015   Plantar fasciitis, right 06/16/2015   Knee torn cartilage 04/26/2015   Allergic rhinitis, seasonal 04/26/2015   Essential (primary) hypertension 04/26/2015   Glaucoma 04/26/2015   Hyperlipidemia associated with type 2 diabetes mellitus (HCC) 04/26/2015   Insomnia 04/26/2015   Primary osteoarthritis of left knee 05/03/2014    Past Surgical History:  Procedure Laterality Date   CATARACT EXTRACTION W/PHACO Left 10/01/2021   Procedure: CATARACT EXTRACTION PHACO AND INTRAOCULAR LENS PLACEMENT (IOC) LEFT HYDRUS MICROSTENT DIABETIC;  Surgeon: Myrna Adine Anes, MD;  Location: [facility];  Service: Ophthalmology;  Laterality: Left;  Diabetic 7.49 00:42.1   CATARACT EXTRACTION W/PHACO Right 10/15/2021   Procedure: PHACO EMULSIFICATION  with HYDRUS MICROSTENT iMPLANTATION;  Surgeon: Myrna Adine Anes, MD;  Location: [facility];  Service: Ophthalmology;  Laterality: Right;  Diabetic 4.98 00:27.3   COLONOSCOPY WITH PROPOFOL  N/A 11/25/2017   Procedure: COLONOSCOPY WITH PROPOFOL ;  Surgeon: Gaylyn Gladis PENNER, MD;  Location: [facility];  Service: Endoscopy;  Laterality: N/A;   ECTOPIC PREGNANCY SURGERY     KNEE ARTHROPLASTY Left 12/17/2023   Procedure: COMPUTER ASSISTED TOTAL KNEE ARTHROPLASTY;  Surgeon: Mardee Lynwood SQUIBB, MD;  Location: [facility];  Service: Orthopedics;  Laterality: Left;   LIPOMA RESECTION     right posterior neck   TOTAL ABDOMINAL HYSTERECTOMY W/ BILATERAL SALPINGOOPHORECTOMY      OB History   No obstetric  history on file.      Home Medications    Prior to Admission medications   Medication Sig Start Date End Date Taking? Authorizing Provider  predniSONE  (STERAPRED UNI-PAK 21 TAB) 10 MG (21) TBPK tablet Take 6 tablets on day 1, 5 tablets day 2, 4 tablets day 3, 3 tablets day 4, 2 tablets day 5, 1 tablet day 6 09/03/24  Yes Bernardino Ditch, NP  Accu-Chek Softclix Lancets lancets Use as instructed 03/08/24   Justus Leita DEL, MD  acetaminophen  (TYLENOL ) 500 MG tablet Take 1,500 mg by mouth every 6 (six) hours as needed for moderate pain (pain score 4-6) or mild pain (pain score 1-3).    [provider]  BD PEN NEEDLE MINI ULTRAFINE 31G X 5 MM MISC USE AS DIRECTED WITH LEVEMIR  06/09/24   Justus Leita DEL, MD  bimatoprost (LUMIGAN) 0.01 % SOLN Place 1 drop into the left eye at bedtime. 08/22/21   [provider]  Black Elderberry 1000 MG CAPS Take 1,000 mg by mouth daily.    [provider]  Cholecalciferol (VITAMIN D ) 50 MCG (2000 UT) tablet Take 2,000 Units by mouth daily.    [provider]  Difluprednate  0.05 % EMUL Place 1 drop into the right eye 2 (two) times daily.    [provider]  dorzolamide -timolol  (COSOPT ) 2-0.5 % ophthalmic solution Place 1 drop into both eyes 2 (two) times daily. 11/04/23   [provider]  ezetimibe  (ZETIA ) 10 MG tablet TAKE 1 TABLET BY MOUTH EVERY DAY 11/17/23   Berglund, Laura H, MD  furosemide (LASIX) 20 MG tablet Take 20 mg by mouth daily.    [provider]  glipiZIDE  (GLUCOTROL ) 5 MG tablet Take 1 tablet (5 mg total) by mouth 2 (two) times daily. Patient taking differently: Take 5 mg by mouth 2 (two) times daily. Taking 1 tablet daily 07/09/24   Berglund, Laura H, MD  glucose blood (ONETOUCH ULTRA) test strip USE TO TEST BLOOD SUGAR UP TO TWICE DAILY. 03/12/23   Justus Leita DEL, MD  hydrOXYzine  (ATARAX ) 25 MG tablet TAKE 0.5-1 TABLETS (12.5-25 MG TOTAL) BY MOUTH EVERY 8 (EIGHT) HOURS AS NEEDED FOR ANXIETY.  04/16/24   Justus Leita DEL, MD  insulin  glargine (LANTUS  SOLOSTAR) 100 UNIT/ML Solostar Pen INJECT 30 UNITS INTO THE SKIN EVERY MORNING ONLY - NO EVENING DOSE 03/30/24   Justus Leita DEL, MD  omeprazole  (PRILOSEC) 20 MG capsule TAKE 1 CAPSULE BY MOUTH EVERY DAY 08/17/24   Justus Leita DEL, MD  pioglitazone  (ACTOS ) 15 MG tablet Take 1 tablet (15 mg total) by mouth daily. 07/09/24   Justus Leita DEL, MD  rosuvastatin  (CRESTOR ) 40 MG tablet Take 1 tablet (40 mg total) by mouth at bedtime. 01/27/24   Justus Leita DEL, MD  sitaGLIPtin (JANUVIA) 100 MG tablet TAKE 1 TABLET BY MOUTH EVERY DAY 04/29/24   Justus Leita DEL, MD  spironolactone  (ALDACTONE ) 25 MG tablet Take 1 tablet (25 mg total) by mouth daily. 04/17/21   Justus Leita DEL, MD  traZODone  (DESYREL ) 50 MG tablet Take 1 tablet (50 mg total) by  mouth at bedtime as needed for sleep. 07/09/24   Justus Leita DEL, MD  albuterol  (PROAIR  HFA) 108 [phone] Base) MCG/ACT inhaler Inhale 2 puffs into the lungs every 6 (six) hours as needed for wheezing or shortness of breath. 02/18/19 12/30/19  Justus Leita DEL, MD    Family History Family History  Problem Relation Age of Onset   Cancer Mother        Breast   Breast cancer Mother    Heart disease Father    Heart attack Brother    Cancer Maternal Aunt        breast   Breast cancer Maternal Aunt    Breast cancer Cousin        mat cousin    Social History Social History   Tobacco Use   Smoking status: Former    Current packs/day: 0.00    Average packs/day: 1 pack/day for 7.0 years (7.0 ttl pk-yrs)    Types: Cigarettes    Start date: 29    Quit date: 1965    Years since quitting: 60.8   Smokeless tobacco: Never   Tobacco comments:    smoking cessation materials not required  Vaping Use   Vaping status: Never Used  Substance Use Topics   Alcohol use: No   Drug use: No     Allergies   Farxiga [dapagliflozin], Alprazolam, Aricept [donepezil], Aspirin , Metformin and related, Riomet  [metformin hcl], Amoxicillin, Hydrocodone , Nabumetone, Skelaxin [metaxalone], and Sulfa antibiotics   Review of Systems Review of Systems  Constitutional:  Negative for fever.  Skin:  Positive for color change and rash.     Physical Exam Triage Vital Signs ED Triage Vitals  Encounter Vitals Group     BP      Girls Systolic BP Percentile      Girls Diastolic BP Percentile      Boys Systolic BP Percentile      Boys Diastolic BP Percentile      Pulse      Resp      Temp      Temp src      SpO2      Weight      Height      Head Circumference      Peak Flow      Pain Score      Pain Loc      Pain Education      Exclude from Growth Chart    No data found.  Updated Vital Signs BP (!) 147/64 (BP Location: Left Arm)   Pulse 66   Temp 97.7 F (36.5 C) (Oral)   Resp 14   Ht 5' 9 (1.753 m)   Wt 182 lb 15.7 oz (83 kg)   SpO2 97%   BMI 27.02 kg/m   Visual Acuity Right Eye Distance:   Left Eye Distance:   Bilateral Distance:    Right Eye Near:   Left Eye Near:    Bilateral Near:     Physical Exam Vitals and nursing note reviewed.  Constitutional:      Appearance: Normal appearance. She is not ill-appearing.  HENT:     Head: Normocephalic and atraumatic.  Skin:    General: Skin is warm and dry.     Capillary Refill: Capillary refill takes less than 2 seconds.     Findings: Erythema present.  Neurological:     General: No focal deficit present.     Mental Status: She is alert and oriented to person, place,  and time.      UC Treatments / Results  Labs (all labs ordered are listed, but only abnormal results are displayed) Labs Reviewed - No data to display  EKG   Radiology No results found.  Procedures Procedures (including critical care time)  Medications Ordered in UC Medications - No data to display  Initial Impression / Assessment and Plan / UC Course  I have reviewed the triage vital signs and the nursing notes.  Pertinent labs & imaging  results that were available during my care of the patient were reviewed by me and considered in my medical decision making (see chart for details).   Patient is a pleasant, nontoxic-appearing 83 year old female presenting for evaluation of a rash on the inner aspect of her left lower extremity that has been present for the last week.  She reports that it is itching and burning primarily at night and then improves during the day.  As you can see in image above, there is a purpleish area of discoloration on the inner aspect of the left lower extremity.  The area of erythema is nonblanchable and is cool to touch.  I do not suspect shingles or cellulitis.  It has more of a vasculitis appearance.  I will discharge her home on a 6-day prednisone  taper and also make a referral to dermatology for further evaluation.  Additionally, she may use over-the-counter antihistamine such as Claritin, Zyrtec, or Allegra during the day and Benadryl  at night to help with itching.   Final Clinical Impressions(s) / UC Diagnoses   Final diagnoses:  Cutaneous vasculitis     Discharge Instructions      As we discussed, your rash is more consistent with vasculitis, which is an inflammation of blood vessels.  In this case, the superficial blood vessels in your left lower extremity.  Take the prednisone  according to the package instructions for the next 6 days to decrease inflammation and aid in pain relief.  You may also use over-the-counter antihistamine such as Claritin, Zyrtec, Allegra in the day and take Benadryl  at night for the itching.  I have also referred you to dermatology for further evaluation should your rash not improve.     ED Prescriptions     Medication Sig Dispense Auth. Provider   predniSONE  (STERAPRED UNI-PAK 21 TAB) 10 MG (21) TBPK tablet Take 6 tablets on day 1, 5 tablets day 2, 4 tablets day 3, 3 tablets day 4, 2 tablets day 5, 1 tablet day 6 21 tablet Bernardino Ditch, NP      PDMP not  reviewed this encounter.   Bernardino Ditch, NP 09/03/24 1158

## 2024-09-22 ENCOUNTER — Other Ambulatory Visit: Payer: Self-pay | Admitting: Internal Medicine

## 2024-09-24 NOTE — Telephone Encounter (Signed)
 Requested Prescriptions  Pending Prescriptions Disp Refills   BD PEN NEEDLE MINI ULTRAFINE 31G X 5 MM MISC [Pharmacy Med Name: BD UF MINI PEN NEEDLE 5MMX31G] 100 each 0    Sig: USE AS DIRECTED WITH LEVEMIR      Endocrinology: Diabetes - Testing Supplies Passed - 09/24/2024  3:27 PM      Passed - Valid encounter within last 12 months    Recent Outpatient Visits           2 months ago Type II diabetes mellitus with complication [facility])   [place] [facility] at [facility], Leita DEL, MD   6 months ago Cough in adult   [facility] at [facility], Leita DEL, MD   6 months ago Essential (primary) hypertension   [facility] Medicine at [facility], Leita DEL, MD

## 2024-09-28 NOTE — Progress Notes (Signed)
 History of Present Illness:  History of Present Illness Cathy Murphy is a 83 year old female s/p left TKA with Dr. Mardee on 12/17/2023 who presents with knee pain.  She reports that the anterior knee pain started after using a small bike for exercise. She describes it as a tender, stinging and burning sensation which is worse with walking and bending. She also has stiffness, swelling, and occasional subjective instability, though it does not hurt to walk. At times the pain prevents her from getting up. She exercises every other day with a few side stretches, leg lifts, and partial squats. These sometimes increase pain and swelling.  She takes 1,000mg  Tylenol  extra strength tablets twice a day without relief. Due to diabetes and kidney issues she avoids ibuprofen and Aleve. She has not used ice consistently, though she occasionally uses a frozen water bottle on the knee. She sleeps on her right side with the knee straight and limits movement in bed to avoid affecting the knee.  Current Outpatient Medications  Medication Sig Dispense Refill   ACCU-CHEK SOFTCLIX LANCETS lancets Use 1 each 4 (four) times daily     acetaminophen  (TYLENOL ) 500 MG tablet Take 1,500 mg by mouth every 6 (six) hours as needed for Pain     albuterol  90 mcg/actuation inhaler Inhale 2 inhalations into the lungs every 6 (six) hours as needed for Shortness of Breath        BD ULTRA-FINE MINI PEN NEEDLE 31 gauge x 3/16 needle USE AS DIRECTED WITH LEVEMIR      cholecalciferol (VITAMIN D3) 2,000 unit tablet Take 2,000 Units by mouth once daily     difluprednate  (DUREZOL ) 0.05 % ophthalmic emulsion Apply 1 drop to eye 2 (two) times daily     dorzolamide -timoloL  (COSOPT ) 22.3-6.8 mg/mL ophthalmic solution ADMINISTER 1 DROP TO BOTH EYES TWO TIMES A DAY.     ELDERBERRY FRUIT ORAL Take 1,000 mg by mouth once daily     ezetimibe  (ZETIA ) 10 mg tablet TAKE 1 TABLET BY MOUTH EVERY DAY 90 tablet 2   FUROsemide (LASIX) 20 MG  tablet Take 1 tablet (20 mg total) by mouth once daily Take 1 tablet for next 2-3 days then take this only as needed for worsening leg swelling. 90 tablet 1   glipiZIDE  (GLUCOTROL ) 5 MG tablet Take 5 mg by mouth once daily     glucose blood (CONTOUR TEST STRIPS) test strip      hydrOXYzine  (ATARAX ) 25 MG tablet TAKE 0.5 1 TABLETS (12.5 25 MG TOTAL) BY MOUTH EVERY 8 (EIGHT) HOURS AS NEEDED FOR ANXIETY.     ketorolac (ACULAR) 0.5 % ophthalmic solution Place 1 drop into the right eye 4 (four) times daily     LANTUS  SOLOSTAR U-100 INSULIN  pen injector (concentration 100 units/mL) Inject 30 Units subcutaneously once daily     LUMIGAN 0.01 % ophthalmic solution Place 1 drop into the left eye at bedtime     omeprazole  (PRILOSEC) 20 MG DR capsule Take 20 mg by mouth once daily     peg 400-propylene glycol, PF, (SYSTANE, PF,) 0.4-0.3 % ophthalmic drops Place 1 drop into both eyes 2 (two) times daily     pioglitazone  (ACTOS ) 15 MG tablet Take 15 mg by mouth once daily.       prednisoLONE acetate (PRED FORTE) 1 % ophthalmic suspension Place 1 drop into the right eye every morning     rosuvastatin  (CRESTOR ) 40 MG tablet Take 40 mg by mouth once daily     sitaGLIPtin  (  JANUVIA ) 100 MG tablet Take 100 mg by mouth once daily     spironolactone  (ALDACTONE ) 25 MG tablet TAKE 1 TABLET BY MOUTH EVERY DAY 90 tablet 3   traZODone  (DESYREL ) 50 MG tablet Take 50 mg by mouth nightly as needed.       No current facility-administered medications for this visit.   Allergies  Allergen Reactions   Dapagliflozin Itching    Genital yeast infection   Alprazolam Nausea   Amoxicillin Diarrhea    IgE = 290 (WNL) on 12/11/2023   Donepezil Rash    Leg cramps   Hydrocodone  Rash   Metaxalone Rash   Metformin Diarrhea   Nabumetone Rash   Sulfa (Sulfonamide Antibiotics) Other (See Comments) and Unknown    Does not remember  unknown    Does not remember  unknown    Does not remember    Does not  remember  unknown  Does not remember   Past Medical History:  Diagnosis Date   Allergic rhinitis, seasonal 04/26/2015   Anxiety    Anxiety attack    Aortic calcification () 03/05/2018   Formatting of this note might be different from the original. Seen on virtual colonoscopy 01/2018   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   Atherosclerosis of abdominal aorta () 06/09/2019   By ct scan 2017   Bilateral leg edema 05/02/2020   Bilateral neck pain 10/08/2018   BMI 24.0-24.9, adult 11/06/2018   Colon polyp    Coronary artery disease involving native coronary artery of native heart 06/09/2019   By ct scan 2017   Degenerative arthritis of right knee 05/03/2014   Diabetes mellitus, type 2 (CMS/HHS-HCC)    Difficulty sleeping 12/29/2020   Dyslipidemia    Essential (primary) hypertension 04/26/2015   Glaucoma    Glaucoma 04/26/2015   Headache disorder 12/29/2020   Hyperlipidemia    Hyperlipidemia associated with type 2 diabetes mellitus (CMS/HHS-HCC) 04/26/2015   Hypertension    Insomnia 04/26/2015   Formatting of this note might be different from the original. chronic - previously on diazepam, trazodone , sonata   Knee pain 05/03/2014   Knee torn cartilage 04/26/2015   Formatting of this note might be different from the original. right knee - seen by Krystal Doyne, PA [facility]   Memory loss or impairment 12/29/2020   Osteopenia determined by x-ray 08/05/2019   Formatting of this note might be different from the original. Recommend vitamin D , Calcium , exercise Repeat DEXA 2022   Plantar fasciitis, right 06/16/2015   Primary osteoarthritis of right knee 01/20/2019   Stage 3a chronic kidney disease (CMS-HCC) 08/16/2020   Type II diabetes mellitus with complication (CMS/HHS-HCC) 11/09/2015   Followed through endocrinology    Ventral hernia without obstruction or gangrene 03/06/2016   Past Surgical History:  Procedure Laterality Date   COLONOSCOPY  11/25/2017   Tortuous  colon/Negative colon biopsy/Will decide if repeat needed after CT Colon on 01/09/2018   Left total knee arthroplasty using computer-assisted navigation  12/17/2023   Dr Mardee   cataract surgery Bilateral    Colonoscopy x2, last 04/02/2012     HYSTERECTOMY     Lipoma removed from neck      Family History  Problem Relation Name Age of Onset   Breast cancer Mother     Heart disease Father     Diabetes type II Father      Social History   Socioeconomic History   Marital status: Widowed   Number of children: 1   Years of  education: 12   Highest education level: High school graduate  Occupational History   Occupation: RetiredBarrister's Clerk  Tobacco Use   Smoking status: Former   Smokeless tobacco: Never  Substance and Sexual Activity   Alcohol use: No   Drug use: No   Sexual activity: Defer   Social Drivers of Corporate Investment Banker Strain: Low Risk  (09/28/2024)   Overall Financial Resource Strain (CARDIA)    Difficulty of Paying Living Expenses: Not hard at all  Food Insecurity: No Food Insecurity (09/28/2024)   Hunger Vital Sign    Worried About Running Out of Food in the Last Year: Never true    Ran Out of Food in the Last Year: Never true  Transportation Needs: No Transportation Needs (09/28/2024)   PRAPARE - Administrator, Civil Service (Medical): No    Lack of Transportation (Non-Medical): No    Review of Systems:  A comprehensive 14 point ROS was performed, reviewed, and the pertinent orthopaedic findings are documented in the HPI.  Physical Exam: Vitals:   09/28/24 1327 09/28/24 1338  BP: (!) 140/80   Weight: 83 kg (183 lb)   Height: 175.3 cm (5' 9)   PainSc:   5   5  PainLoc: Knee Knee   General/Constitutional: The patient appears to be well-nourished, well-developed, and in no acute distress. Neuro/Psych: Normal mood and affect, oriented to person, place and time. Respiratory: Normal chest excursion, No wheezes, and  Non-labored breathing Vascular: No edema, swelling or tenderness, except as noted in detailed exam. Integumentary: No impressive skin lesions present, except as noted in detailed exam. Musculoskeletal: Unremarkable, except as noted in detailed exam.  Left  knee exam:     Soft tissue swelling: mild    Effusion:  minimal    Erythema:  none    Crepitance:  none    Tenderness:  Patella,patellar tendon    Alignment:  relative varus    Mediolateral laxity: stable    Anterior drawer test:negative      Atrophy:  Generalized quadriceps atrophy.      Quadriceps tone was fair to good.     Range of Motion: 0/0/110 degrees  Patient is neurovascularly intact all dermatomes extending down their Left lower extremity and into their foot.  Posterior tibial pulses appreciated, 2+.    Knee Imaging: I ordered and interpreted standing AP, lateral, and sunrise views of the left knee that were obtained in the office today. Good position of the total knee implants. Good alignment is noted on the AP view. Good cement mantle is appreciated without evidence of loosening. No evidence of polyethylene wear or osteolysis. No evidence of fracture or dislocation.   Encounter Diagnosis  Name Primary?   Status post total left knee replacement Yes   I spent a total of 46 minutes in both face-to-face and non-face-to-face activities, excluding procedures performed, for this visit on the date of this encounter.   Plan: Assessment & Plan Left knee pain and swelling after total knee arthroplasty, exacerbated by exercise. Pain localized to patella. X-rays normal. Limited medication options due to comorbidities. - Alternate ice and heat therapy for swelling and pain management. - Apply Voltaren  gel to patella area four times daily for 1-2 weeks. - Increase Tylenol  to 1000 mg three times daily, max 3000 mg/day. - Monitor symptoms, report in 1-2 weeks if no improvement or worsening.   This note has been created using  automated tools and reviewed for accuracy by VICK SCHULTZE CASTANHEIRO  DE CARVALHO COSTA.  Vick Dunk, PA-C [facility]

## 2024-10-11 NOTE — Progress Notes (Signed)
 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   Right    Left    Systemic   Date VA IOP Exam Plan VA IOP Exam Plan    Jul 26, 1941         DOB               10/17/2021    IOL        10/18/2021 HM 13    --      01/15/2023    ISHF        01/16/2023 CF 5    25      05/07/2023 20/60 14   20/25 19      07/02/2023 20/80 22   20/25 24      09/03/2023 20/80 30   20/25 30      10/15/2023 20/80 23   20/25 24      11/19/2023 20/80 22 worse SCT 20/20 24      03/17/2024  20/60 18 Better  20/20 23      08/30/2024 20/100  Worse DFP, KTL 20/20       10/11/2024 20/70 24 better same 20/25 27                    Initial History of present illness:  Cathy Murphy is a 83 y.o. female ,  presents for retina eval (consult from Dr. Jama) regarding (possible) uveitis. Pt states that vision right eye remains blurry (problem has been present since PPV/Yamane lens exchange OD Zhang/Jones 01/15/2023). She denies flashes, floaters, or veil/ curtain both eyes.   - Pred acetate QID right eye  - Lumigan at bedtime left eye  - Cosopt  at bedtime right eye - Systane prn both eyes for dry eye both eyes   - Last BS: 79 - Highest BS in past 6-8 wks: less than 100 - Last A1C: 8.1     has a past medical history of Anisocoria, Anxiety, Arthritis, Asthma (HHS-HCC), Cataract, Diabetes mellitus (CMS-HCC), Glaucoma, Hyperlipidemia, and Hypertension.   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 Possibility of high astigmatism or slight tilt to scleral fixated lens.  05/28/2023 First visit [facility]    10/11/2024  sl better edema right eye today. Will continue current regimen and see how much benefit it possible. Consider xipere  next visit if needed.    Return in about 6 weeks (around 11/22/2024) for  OCT with EDI, possible, Xipere , Right.   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    Copy to :  Referring provider  Primary Care Provider Justus Leita Mettle, MD  INTERPRETATION EXTENDED OPHTHALMOSCOPY MACULA (90 Dlens) Macula - right: vitreous syneresis,  Attached Macula - left:  vitreous syneresis, Normal     10/11/2024  I saw and evaluated the patient, participating in the key portions of the service.  I reviewed the residents note.  I agree with the residents findings and plan including interpretation of images.      Uveitis is a serious, complex  condition requiring ongoing medical care service coordination with a uveitis specialist as the focal point. (G2211)

## 2024-10-18 NOTE — Progress Notes (Signed)
 Telemedicine telephone encounter documentation  This telephone encounter was conducted with the patient's (or proxy's) verbal consent via secure, interactive audio telecommunications while away from clinic/office/hospital.  The patient (or proxy) was instructed to have this encounter in a suitably private space and to only have persons present to whom they give permission to participate. In addition, patient identity was confirmed by use of name plus two identifiers.  Chief Complaint:   Chief Complaint  Patient presents with   Follow-up    Subjective:   Cathy Murphy is a 83 y.o. female established patient. Telephone visit today for: History of Present Illness  PMH: HTN CAD by CT Abdomen/Pelvis 2017 - coronary calcifications PVD Atherosclerosis of abdominal aorta Mild carotid artery stenosis 1-49% by US  11/12/23 T2DM - last A1C 6.2% on 03/08/24 labs HLD- last lipid panel 76 mg/dL on 02/05/73 labs H/O syncope?  H/O falls CKD Stage 3b Chronic DOE Denies h/o tobacco use or current lung disease. Asthma in childhood.  2D ECHO 05/14/23, done due to DOE and BLE edema, that showed normal LV and RV systolic function with EF > 55%. Trivial MR, mild TR, mild PR. No valvular stenosis.  Lexiscan  Myoview 10/20/23 prior to cardiac clearance for left TKA surgery that was without evidence of stress induced ischemia.   Chronic BLE, L>R since her left TKA surgery in 12/2023   Has had falls going back to at least 2022. Review of clinic notes reviews work up in 2023 for syncope. Pt denies syncope history. Reports falls are without prodrome or dizziness. She does not know what precedes or causes her falls but denies syncope or LOC. Prior w/u in 2022 included 14-day Holter. ECHO, carotid US , and Myoview reportedly ordered but not completed.  14-day Holter 10/16/21 - 10/31/21 revealed baseline normal sinus rhythm with maximum heart rate of 151 bpm minimum of 65 beats per an average of 85 bpm. There is a rare  preventricular contraction. Heart rate is variable depending on activity levels with some rare episodes of supraventricular tachycardia of 3 and 4 beats. No evidence of malignant tachycardia symptomatic bradycardia and/or advanced heart block.  Carotid US  11/12/2023 revealed mild (1-49%) bilateral carotid artery stenosis.  2D ECHO 05/14/23, done due to DOE and BLE edema, that showed normal LV and RV systolic function with EF > 55%. Trivial MR, mild TR, mild PR. No valvular stenosis.  Lexiscan  Myoview 10/20/23 prior to cardiac clearance for left TKA surgery that was without evidence of stress induced ischemia.    She presents today for 6 month follow up via televisit. She went to [facility] location by accident today and so visit was rescheduled from in-person to virtual. Reports she is doing well. Denies any cardiac concerns. She was seen by Danita Bloch, PA 05/12/24 due to BLE swelling. Was prescribed Lasix 20 mg qd for 2-3 days and advised to then daily PRN. She has been taking Lasix 20 mg daily since last visit. Denies leg swelling on Lasix. Denies dyspnea, weight changes, chest pain, palpitations, dizziness, or falls.        Patient Active Problem List  Diagnosis   Essential (primary) hypertension   Type II diabetes mellitus with complication (CMS/HHS-HCC)   Dyslipidemia   Anxiety attack   Degenerative arthritis of right knee   Knee pain   Coronary artery disease involving native coronary artery of native heart   Atherosclerosis of abdominal aorta ()   Bilateral leg edema   Hyperlipidemia associated with type 2 diabetes mellitus (CMS/HHS-HCC)   Memory  loss or impairment   Difficulty sleeping   Headache disorder   Allergic rhinitis, seasonal   Aortic calcification ()   Arthritis of shoulder   Bilateral neck pain   BMI 24.0-24.9, adult   Glaucoma   Insomnia   Knee torn cartilage   Osteopenia determined by x-ray   Plantar fasciitis, right   Stage 3a chronic kidney  disease (CMS-HCC)   Vascular calcification   Asymptomatic bilateral carotid artery stenosis   Status post total left knee replacement    Outpatient Medications Prior to Visit  Medication Sig Dispense Refill   ACCU-CHEK SOFTCLIX LANCETS lancets Use 1 each 4 (four) times daily     acetaminophen  (TYLENOL ) 500 MG tablet Take 1,500 mg by mouth every 6 (six) hours as needed for Pain     albuterol  90 mcg/actuation inhaler Inhale 2 inhalations into the lungs every 6 (six) hours as needed for Shortness of Breath        BD ULTRA-FINE MINI PEN NEEDLE 31 gauge x 3/16 needle USE AS DIRECTED WITH LEVEMIR      cholecalciferol (VITAMIN D3) 2,000 unit tablet Take 2,000 Units by mouth once daily     difluprednate  (DUREZOL ) 0.05 % ophthalmic emulsion Apply 1 drop to eye 2 (two) times daily     dorzolamide -timoloL  (COSOPT ) 22.3-6.8 mg/mL ophthalmic solution ADMINISTER 1 DROP TO BOTH EYES TWO TIMES A DAY.     ELDERBERRY FRUIT ORAL Take 1,000 mg by mouth once daily     ezetimibe  (ZETIA ) 10 mg tablet TAKE 1 TABLET BY MOUTH EVERY DAY 90 tablet 2   FUROsemide (LASIX) 20 MG tablet Take 1 tablet (20 mg total) by mouth once daily Take 1 tablet for next 2-3 days then take this only as needed for worsening leg swelling. 90 tablet 1   glipiZIDE  (GLUCOTROL ) 5 MG tablet Take 5 mg by mouth once daily     glucose blood (CONTOUR TEST STRIPS) test strip      hydrOXYzine  (ATARAX ) 25 MG tablet TAKE 0.5 1 TABLETS (12.5 25 MG TOTAL) BY MOUTH EVERY 8 (EIGHT) HOURS AS NEEDED FOR ANXIETY.     ketorolac (ACULAR) 0.5 % ophthalmic solution Place 1 drop into the right eye 4 (four) times daily     LANTUS  SOLOSTAR U-100 INSULIN  pen injector (concentration 100 units/mL) Inject 30 Units subcutaneously once daily     LUMIGAN 0.01 % ophthalmic solution Place 1 drop into the left eye at bedtime     omeprazole  (PRILOSEC) 20 MG DR capsule Take 20 mg by mouth once daily     peg 400-propylene glycol, PF, (SYSTANE, PF,) 0.4-0.3 %  ophthalmic drops Place 1 drop into both eyes 2 (two) times daily     pioglitazone  (ACTOS ) 15 MG tablet Take 15 mg by mouth once daily.       prednisoLONE acetate (PRED FORTE) 1 % ophthalmic suspension Place 1 drop into the right eye every morning     rosuvastatin  (CRESTOR ) 40 MG tablet Take 40 mg by mouth once daily     sitaGLIPtin  (JANUVIA ) 100 MG tablet Take 100 mg by mouth once daily     spironolactone  (ALDACTONE ) 25 MG tablet TAKE 1 TABLET BY MOUTH EVERY DAY 90 tablet 3   traZODone  (DESYREL ) 50 MG tablet Take 50 mg by mouth nightly as needed.       No facility-administered medications prior to visit.    Review of Systems  Constitutional:        Denies weight gain  Respiratory:  Negative for shortness  of breath.   Cardiovascular:  Negative for chest pain, palpitations, orthopnea and leg swelling.  Neurological:  Negative for dizziness.       Denies falls  Endo/Heme/Allergies:  Does not bruise/bleed easily.     Objective:   Vitals as reported by patient: There were no vitals filed for this visit. There is no height or weight on file to calculate BMI. Patient sounds well today, in no distress , speaking in full sentences   Results    Assessment/Plan:   Assessment & Plan  Diagnoses and all orders for this visit:  Essential (primary) hypertension -     Basic Metabolic Panel (BMP); Future  Coronary artery disease involving native coronary artery of native heart without angina pectoris  Atherosclerosis of abdominal aorta ()  Aortic calcification ()  Vascular calcification  Type II diabetes mellitus with complication (CMS/HHS-HCC)  Dyslipidemia  Asymptomatic bilateral carotid artery stenosis  Stage 3a chronic kidney disease (CMS-HCC)  Bilateral leg edema   83 yo female presents with h/o frequent falls, without LOC. Unclear history of syncope. She denies recurrent falls or syncope since last visit. She has a h/o chronic DOE, which she denies today. She  has new BLE edema since last visit, which improved on Lasix. She has been taking daily instead of PRN. She has normal systolic function on 2D ECHO 05/2023 with trivial valvular regurgitation, no valvular stenosis. She is without chest pain and had no evidence of ischemia by Lexiscan  Myoview 10/2023, done for surgical cardiac clearance She reports dizziness that presents as more consistent with vertigo or BPPV. She had a 14-day Holter in 10/2021 without severe bradycardia, significant arrhythmia, or high-grade block. Carotid US  11/2023 with mild bilateral carotid stenosis.    - Schedule Nurse Visit for BP check and BMP since pt has been taking Lasix daily instead of PRN with underlying CKD.  - Continue secondary ASCVD prevention with rosuvastatin  40 mg qd and zetia  10 mg qd. Last LDL 76 mg/dL 02/05/73. Goal LDL < 70 mg/dL. Will continue to monitor.   This visit was coded based on medical decision making (MDM).  Nurse Visit 1-2 weeks to check BP and BMP Return in about 6 months (around 04/18/2025).     Future Appointments     Date/Time Provider Department Center Visit Type   11/01/2024 11:00 AM [facility] NURSE POD A [facility] C NURSE VISIT   04/18/2025 11:00 AM Elodie Ronal Tinnie Launie, NP [facility] C FOLLOW UP       There are no Patient Instructions on file for this visit.   An after visit summary was provided for the patient either in written format (printed) or through MyChart.  This note has been created using automated tools and reviewed for accuracy by RONAL TINNIE CHEEK PARRISH.

## 2024-11-02 NOTE — Progress Notes (Signed)
 Called pt 11/02/24 regarding BP and lab results. See Lab note for plan. Unable to reach pt by phone, sent MyChart message. MARY TINNIE LAUNIE MAIDEN, NP

## 2024-11-04 ENCOUNTER — Other Ambulatory Visit: Payer: Self-pay | Admitting: Internal Medicine

## 2024-11-04 DIAGNOSIS — E118 Type 2 diabetes mellitus with unspecified complications: Secondary | ICD-10-CM

## 2024-11-05 NOTE — Telephone Encounter (Signed)
 Requested Prescriptions  Pending Prescriptions Disp Refills   LANTUS  SOLOSTAR 100 UNIT/ML Solostar Pen [Pharmacy Med Name: LANTUS  SOLOSTAR 100 UNIT/ML] 30 mL 1    Sig: INJECT 30 UNITS INTO THE SKIN EVERY MORNING ONLY - NO EVENING DOSE     Endocrinology:  Diabetes - Insulins Passed - 11/05/2024 12:45 PM      Passed - HBA1C is between 0 and 7.9 and within 180 days    Hemoglobin A1C  Date Value Ref Range Status  06/25/2022 6.4  Final   Hgb A1c MFr Bld  Date Value Ref Range Status  07/09/2024 7.5 (H) 4.8 - 5.6 % Final    Comment:             Prediabetes: 5.7 - 6.4          Diabetes: >6.4          Glycemic control for adults with diabetes: <7.0          Passed - Valid encounter within last 6 months    Recent Outpatient Visits           3 months ago Type II diabetes mellitus with complication [facility])   [place] [facility] at [facility], Leita DEL, MD   7 months ago Cough in adult   [facility] at [facility], Leita DEL, MD   8 months ago Essential (primary) hypertension   [place] [facility] at [facility], Leita DEL, MD

## 2024-11-08 ENCOUNTER — Ambulatory Visit: Admitting: Student

## 2024-11-08 ENCOUNTER — Encounter: Payer: Self-pay | Admitting: Student

## 2024-11-08 VITALS — BP 124/66 | HR 93 | Ht 69.0 in | Wt 186.0 lb

## 2024-11-08 DIAGNOSIS — F5101 Primary insomnia: Secondary | ICD-10-CM

## 2024-11-08 DIAGNOSIS — I2510 Atherosclerotic heart disease of native coronary artery without angina pectoris: Secondary | ICD-10-CM | POA: Diagnosis not present

## 2024-11-08 DIAGNOSIS — Z794 Long term (current) use of insulin: Secondary | ICD-10-CM | POA: Diagnosis not present

## 2024-11-08 DIAGNOSIS — I10 Essential (primary) hypertension: Secondary | ICD-10-CM

## 2024-11-08 DIAGNOSIS — N1831 Chronic kidney disease, stage 3a: Secondary | ICD-10-CM

## 2024-11-08 DIAGNOSIS — E1122 Type 2 diabetes mellitus with diabetic chronic kidney disease: Secondary | ICD-10-CM

## 2024-11-08 DIAGNOSIS — E118 Type 2 diabetes mellitus with unspecified complications: Secondary | ICD-10-CM | POA: Diagnosis not present

## 2024-11-08 MED ORDER — GLIPIZIDE 5 MG PO TABS: 5.0000 mg | ORAL_TABLET | Freq: Every day | ORAL | Status: AC

## 2024-11-08 MED ORDER — BLOOD GLUCOSE TEST VI STRP: 1.0000 | ORAL_STRIP | 0 refills | Status: AC

## 2024-11-08 MED ORDER — BLOOD GLUCOSE MONITORING SUPPL DEVI: 1.0000 | 0 refills | Status: AC

## 2024-11-08 MED ORDER — LANCETS MISC: 1.0000 | 0 refills | Status: AC

## 2024-11-08 MED ORDER — LANTUS SOLOSTAR 100 UNIT/ML ~~LOC~~ SOPN: PEN_INJECTOR | SUBCUTANEOUS | 1 refills | Status: AC

## 2024-11-08 MED ORDER — LANCET DEVICE MISC: 1.0000 | 0 refills | Status: AC

## 2024-11-08 MED ORDER — SITAGLIPTIN PHOSPHATE 100 MG PO TABS: 100.0000 mg | ORAL_TABLET | Freq: Every day | ORAL | 1 refills | Status: AC

## 2024-11-08 NOTE — Assessment & Plan Note (Addendum)
 Last A1c 7.5%.  Reports one episodes of nausea/lightheadedness about 1 week ago and feels this was due low glucoses, but unable to check glucoses. Reports meter broke about 4-6 months ago. Medications currently are januvia  100 mg daily, lantus  30 units in the morning, and glipizide  5 mg daily.  Continue current medications for now Ordered her a new meter She will track glucoses will review at next visit and adjust medications if having frequent lows.  A1c today  Foot exam performed today

## 2024-11-08 NOTE — Assessment & Plan Note (Signed)
 BP well today currently on spironolactone  25 mg daily. Lasix was discontinued. Still has occasional LE edema but manageable with compression stocking and elevation.

## 2024-11-08 NOTE — Assessment & Plan Note (Addendum)
 Currently seeing cardiology at [facility]. Currently on rosuvastatin  40 mg daily. LDL 76 on 03/2024 lipid panel next  visit.

## 2024-11-08 NOTE — Progress Notes (Signed)
 Transfer   Established Patient Office Visit  Subjective   Patient ID: Cathy Murphy, female    DOB: 07/08/1941  Age: 84 y.o. MRN: [id number]  Chief Complaint  Patient presents with   Diabetes Mellitus   Hypertension    CATHRINE Murphy is a 84 y.o. person with medical hx listed below who presents today for transfer of care.   Patient Active Problem List   Diagnosis Date Noted   History of total knee arthroplasty, 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   Asymptomatic bilateral carotid artery stenosis 11/19/2023   Vascular calcification 07/22/2023   Syncope 12/06/2021   Mild cognitive impairment 12/29/2020   Headache disorder 12/29/2020   Stage 3a chronic kidney disease (HCC) 08/16/2020   Osteopenia determined by x-ray 08/05/2019   Atherosclerosis of abdominal aorta 06/09/2019   Coronary artery disease involving native coronary artery of native heart 06/09/2019   Aortic calcification 03/05/2018   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   Ventral hernia without obstruction or gangrene 03/06/2016   Type II diabetes mellitus with complication (HCC) 11/09/2015   Knee torn cartilage 04/26/2015   Allergic rhinitis, seasonal 04/26/2015   Essential (primary) hypertension 04/26/2015   Glaucoma 04/26/2015   Hyperlipidemia associated with type 2 diabetes mellitus (HCC) 04/26/2015   Insomnia 04/26/2015   Primary osteoarthritis of left knee 05/03/2014      ROS Refer to HPI    Objective:     Outpatient Encounter Medications as of 11/08/2024  Medication Sig   acetaminophen  (TYLENOL ) 500 MG tablet Take 1,500 mg by mouth every 6 (six) hours as needed for moderate pain (pain score 4-6) or mild pain (pain score 1-3).   BD PEN NEEDLE MINI ULTRAFINE 31G X 5 MM MISC USE AS DIRECTED WITH LEVEMIR    bimatoprost (LUMIGAN) 0.01 % SOLN Place 1 drop into the left eye at bedtime.   Black Elderberry 1000 MG CAPS Take 1,000 mg by mouth daily.   Blood Glucose Monitoring Suppl DEVI 1 each by Does not apply route as directed.  Dispense based on patient and insurance preference. Use up to four times daily as directed. (FOR ICD-10 E10.9, E11.9).   Cholecalciferol (VITAMIN D ) 50 MCG (2000 UT) tablet Take 2,000 Units by mouth daily.   dorzolamide -timolol  (COSOPT ) 2-0.5 % ophthalmic solution Place 1 drop into both eyes 2 (two) times daily.   ezetimibe  (ZETIA ) 10 MG tablet TAKE 1 TABLET BY MOUTH EVERY DAY   Glucose Blood (BLOOD GLUCOSE TEST STRIPS) STRP 1 each by Does not apply route as directed. Use to check fasting CBG once daily in the morning   hydrOXYzine  (ATARAX ) 25 MG tablet TAKE 0.5-1 TABLETS (12.5-25 MG TOTAL) BY MOUTH EVERY 8 (EIGHT) HOURS AS NEEDED FOR ANXIETY.   ketorolac (ACULAR) 0.5 % ophthalmic solution Place 1 drop into the right eye 4 (four) times daily.   Lancet Device MISC 1 each by Does not apply route as directed. Use to check fasting CBG once daily in the morning   Lancets MISC 1 each by Does not apply route as directed. Use to check fasting CBG once daily in the morning   omeprazole  (PRILOSEC) 20 MG capsule TAKE 1 CAPSULE BY MOUTH EVERY DAY   pioglitazone  (ACTOS ) 15 MG tablet Take 1 tablet (15 mg total) by mouth daily.   prednisoLONE acetate (PRED FORTE) 1 % ophthalmic suspension 1 drop 4 (four) times daily.   rosuvastatin  (CRESTOR ) 40 MG tablet Take 1 tablet (40 mg total) by mouth at bedtime.   spironolactone  (ALDACTONE ) 25 MG tablet Take 1 tablet (  25 mg total) by mouth daily.   traZODone  (DESYREL ) 50 MG tablet Take 1 tablet (50 mg total) by mouth at bedtime as needed for sleep.   [DISCONTINUED] Accu-Chek Softclix Lancets lancets Use as instructed   [DISCONTINUED] glipiZIDE  (GLUCOTROL ) 5 MG tablet Take 1 tablet (5 mg total) by mouth 2 (two) times daily. (Patient taking differently: Take 5 mg by mouth 2 (two) times daily. Taking 1 tablet daily)   [DISCONTINUED] insulin  glargine (LANTUS  SOLOSTAR) 100 UNIT/ML Solostar Pen INJECT 30 UNITS INTO THE SKIN EVERY MORNING ONLY - NO EVENING DOSE   [DISCONTINUED]  sitaGLIPtin  (JANUVIA ) 100 MG tablet TAKE 1 TABLET BY MOUTH EVERY DAY   Difluprednate  0.05 % EMUL Place 1 drop into the right eye 2 (two) times daily. (Patient not taking: Reported on 11/08/2024)   glipiZIDE  (GLUCOTROL ) 5 MG tablet Take 1 tablet (5 mg total) by mouth daily before breakfast.   insulin  glargine (LANTUS  SOLOSTAR) 100 UNIT/ML Solostar Pen Inject 30 Units into the skin every morning only - no evening dose   sitaGLIPtin  (JANUVIA ) 100 MG tablet Take 1 tablet (100 mg total) by mouth daily.   [DISCONTINUED] albuterol  (PROAIR  HFA) 108 (90 Base) MCG/ACT inhaler Inhale 2 puffs into the lungs every 6 (six) hours as needed for wheezing or shortness of breath.   [DISCONTINUED] furosemide (LASIX) 20 MG tablet Take 20 mg by mouth daily. (Patient not taking: Reported on 11/08/2024)   [DISCONTINUED] glucose blood (ONETOUCH ULTRA) test strip USE TO TEST BLOOD SUGAR UP TO TWICE DAILY.   [DISCONTINUED] predniSONE  (STERAPRED UNI-PAK 21 TAB) 10 MG (21) TBPK tablet Take 6 tablets on day 1, 5 tablets day 2, 4 tablets day 3, 3 tablets day 4, 2 tablets day 5, 1 tablet day 6 (Patient not taking: Reported on 11/08/2024)   No facility-administered encounter medications on file as of 11/08/2024.    BP 124/66   Pulse 93   Ht 5' 9 (1.753 m)   Wt 186 lb (84.4 kg)   SpO2 93%   BMI 27.47 kg/m  BP Readings from Last 3 Encounters:  11/08/24 124/66  09/03/24 (!) 147/64  07/09/24 124/70    Physical Exam Constitutional:      Appearance: Normal appearance.  HENT:     Head: Normocephalic and atraumatic.     Mouth/Throat:     Mouth: Mucous membranes are moist.     Pharynx: Oropharynx is clear.  Cardiovascular:     Rate and Rhythm: Normal rate and regular rhythm.  Pulmonary:     Effort: Pulmonary effort is normal.     Breath sounds: No rhonchi or rales.  Abdominal:     General: Abdomen is flat. Bowel sounds are normal. There is no distension.     Palpations: Abdomen is soft.     Tenderness: There is no  abdominal tenderness.  Musculoskeletal:        General: Normal range of motion.     Right lower leg: Edema (1+ to mid shin) present.     Left lower leg: Edema (1+ to mid shin) present.  Skin:    General: Skin is warm and dry.     Capillary Refill: Capillary refill takes less than 2 seconds.  Neurological:     General: No focal deficit present.     Mental Status: She is alert and oriented to person, place, and time.  Psychiatric:        Mood and Affect: Mood normal.        Behavior: Behavior normal.  Diabetic Foot Exam - Simple   Simple Foot Form Diabetic Foot exam was performed with the following findings: Yes 11/08/2024 11:22 PM  Visual Inspection No deformities, no ulcerations, no other skin breakdown bilaterally: Yes Sensation Testing Intact to touch and monofilament testing bilaterally: Yes Pulse Check Posterior Tibialis and Dorsalis pulse intact bilaterally: Yes Comments         11/08/2024   10:46 AM 07/28/2024    9:34 AM 07/09/2024    1:22 PM  Depression screen PHQ 2/9  Decreased Interest 0 0 1  Down, Depressed, Hopeless 0 0 0  PHQ - 2 Score 0 0 1  Altered sleeping  0 0  Tired, decreased energy  1 1  Change in appetite  0 0  Feeling bad or failure about yourself   0 0  Trouble concentrating  0 0  Moving slowly or fidgety/restless  0 0  Suicidal thoughts  0 0  PHQ-9 Score  1  2   Difficult doing work/chores  Not difficult at all Not difficult at all     Data saved with a previous flowsheet row definition       11/08/2024   10:46 AM 07/09/2024    1:22 PM 03/08/2024    9:59 AM 07/22/2023   11:02 AM  GAD 7 : Generalized Anxiety Score  Nervous, Anxious, on Edge 0 0 1 2  Control/stop worrying 0 0 0 2  Worry too much - different things  0 1 2  Trouble relaxing  0 1 3  Restless  0 1 3  Easily annoyed or irritable  0 1 2  Afraid - awful might happen  0 1 2  Total GAD 7 Score  0 6 16  Anxiety Difficulty  Not difficult at all Not difficult at all Not difficult at all     No results found for any visits on 11/08/24.  Last CBC Lab Results  Component Value Date   WBC 8.4 03/19/2024   HGB 11.7 (L) 03/19/2024   HCT 38.1 03/19/2024   MCV 94.3 03/19/2024   MCH 29.0 03/19/2024   RDW 13.3 03/19/2024   PLT 203 03/19/2024   Last metabolic panel Lab Results  Component Value Date   GLUCOSE 61 (L) 07/09/2024   NA 143 07/09/2024   K 4.5 07/09/2024   CL 103 07/09/2024   CO2 21 07/09/2024   BUN 17 07/09/2024   CREATININE 1.28 (H) 07/09/2024   EGFR 42 (L) 07/09/2024   CALCIUM  9.6 07/09/2024   PROT 7.3 03/19/2024   ALBUMIN 3.9 03/19/2024   LABGLOB 2.8 03/08/2024   AGRATIO 1.6 08/29/2021   BILITOT 1.4 (H) 03/19/2024   ALKPHOS 41 03/19/2024   AST 16 03/19/2024   ALT 11 03/19/2024   ANIONGAP 10 03/19/2024   Last lipids Lab Results  Component Value Date   CHOL 152 03/08/2024   HDL 55 03/08/2024   LDLCALC 76 03/08/2024   TRIG 115 03/08/2024   CHOLHDL 2.8 03/08/2024   Last hemoglobin A1c Lab Results  Component Value Date   HGBA1C 7.5 (H) 07/09/2024      The ASCVD Risk score (Arnett DK, et al., 2019) failed to calculate for the following reasons:   The 2019 ASCVD risk score is only valid for ages 73 to 51   * - Cholesterol units were assumed    Assessment & Plan:  Type II diabetes mellitus with complication (HCC) Assessment & Plan: Last A1c 7.5%.  Reports one episodes of nausea/lightheadedness about 1 week ago and  feels this was due low glucoses, but unable to check glucoses. Reports meter broke about 4-6 months ago. Medications currently are januvia  100 mg daily, lantus  30 units in the morning, and glipizide  5 mg daily.  Continue current medications for now Ordered her a new meter She will track glucoses will review at next visit and adjust medications if having frequent lows.  A1c today  Foot exam performed today   Orders: -     Lantus  SoloStar; Inject 30 Units into the skin every morning only - no evening dose  Dispense: 30 mL;  Refill: 1 -     glipiZIDE ; Take 1 tablet (5 mg total) by mouth daily before breakfast. -     Hemoglobin A1c  Essential (primary) hypertension Assessment & Plan: BP well today currently on spironolactone  25 mg daily. Lasix was discontinued. Still has occasional LE edema but manageable with compression stocking and elevation.    Primary insomnia Assessment & Plan: Treated with trazodone  50 mg as needed.    Coronary artery disease involving native coronary artery of native heart without angina pectoris Assessment & Plan: Currently seeing cardiology at [facility]. Currently on rosuvastatin  40 mg daily. LDL 76 on 03/2024 lipid panel next  visit.   Stage 3a chronic kidney disease (HCC) Assessment & Plan: Stable on recent labs on 12/29, no longer on lasix. Encourage hydration.    Other orders -     SITagliptin  Phosphate; Take 1 tablet (100 mg total) by mouth daily.  Dispense: 90 tablet; Refill: 1 -     Blood Glucose Monitoring Suppl; 1 each by Does not apply route as directed. Dispense based on patient and insurance preference. Use up to four times daily as directed. (FOR ICD-10 E10.9, E11.9).  Dispense: 1 each; Refill: 0 -     Blood Glucose Test; 1 each by Does not apply route as directed. Use to check fasting CBG once daily in the morning  Dispense: 100 strip; Refill: 0 -     Lancet Device; 1 each by Does not apply route as directed. Use to check fasting CBG once daily in the morning  Dispense: 1 each; Refill: 0 -     Lancets; 1 each by Does not apply route as directed. Use to check fasting CBG once daily in the morning  Dispense: 100 each; Refill: 0     No follow-ups on file.    Harlene Saddler, MD

## 2024-11-08 NOTE — Assessment & Plan Note (Signed)
 Treated with trazodone  50 mg as needed.

## 2024-11-08 NOTE — Assessment & Plan Note (Signed)
 Stable on recent labs on 12/29, no longer on lasix. Encourage hydration.

## 2024-11-09 ENCOUNTER — Ambulatory Visit: Payer: Self-pay | Admitting: Student

## 2024-11-09 LAB — HEMOGLOBIN A1C
Est. average glucose Bld gHb Est-mCnc: 169 mg/dL
Hgb A1c MFr Bld: 7.5 % — ABNORMAL HIGH (ref 4.8–5.6)

## 2024-11-12 NOTE — Progress Notes (Signed)
 Labs printed and mailed.  KP

## 2024-11-18 ENCOUNTER — Telehealth: Payer: Self-pay

## 2024-11-18 NOTE — Telephone Encounter (Signed)
 Called pt she will call back to let us  know.  KP

## 2024-11-18 NOTE — Telephone Encounter (Signed)
 Copied from CRM #[id number]. Topic: Clinical - Medication Question >> Nov 18, 2024  9:47 AM Alfonso HERO wrote: Reason for CRM: patient is calling to let Dr. Lemon know that she had to start back taking Omeprazole  because she started coughing really bad.

## 2024-12-01 ENCOUNTER — Other Ambulatory Visit: Payer: Self-pay | Admitting: Student

## 2024-12-01 DIAGNOSIS — H6123 Impacted cerumen, bilateral: Secondary | ICD-10-CM

## 2024-12-01 NOTE — Telephone Encounter (Unsigned)
 Copied from CRM [id number]. Topic: Clinical - Medication Refill >> Dec 01, 2024 12:30 PM Emylou G wrote: Medication: omeprazole  (PRILOSEC) 20 MG capsule  Has the patient contacted their pharmacy? yes (Agent: If no, request that the patient contact the pharmacy for the refill. If patient does not wish to contact the pharmacy document the reason why and proceed with request.) (Agent: If yes, when and what did the pharmacy advise?) gave wrong meds  This is the patient's preferred pharmacy:  [facility] [phone] [address], [place] - [address] [place] [address] Phone: [phone] Fax: [phone]  Is this the correct pharmacy for this prescription? Yes If no, delete pharmacy and type the correct one.   Has the prescription been filled recently? No  Is the patient out of the medication? Yes  Has the patient been seen for an appointment in the last year OR does the patient have an upcoming appointment? Yes  Can we respond through MyChart? No  Agent: Please be advised that Rx refills may take up to 3 business days. We ask that you follow-up with your pharmacy.

## 2024-12-02 MED ORDER — OMEPRAZOLE 20 MG PO CPDR: 20.0000 mg | DELAYED_RELEASE_CAPSULE | Freq: Every day | ORAL | 1 refills | Status: AC

## 2024-12-02 NOTE — Telephone Encounter (Signed)
 Requested Prescriptions  Pending Prescriptions Disp Refills   omeprazole  (PRILOSEC) 20 MG capsule 90 capsule 1    Sig: Take 1 capsule (20 mg total) by mouth daily.     Gastroenterology: Proton Pump Inhibitors Passed - 12/02/2024  1:40 PM      Passed - Valid encounter within last 12 months    Recent Outpatient Visits           3 weeks ago Type II diabetes mellitus with complication [facility])   [place] [facility] at [facility], Harlene, MD   4 months ago Type II diabetes mellitus with complication [facility])   [place] [facility] at [facility], Leita DEL, MD   8 months ago Cough in adult   [facility] at [facility], Leita DEL, MD   8 months ago Essential (primary) hypertension   [facility] at [facility], Leita DEL, MD

## 2025-03-24 ENCOUNTER — Ambulatory Visit: Admitting: Physician Assistant

## 2025-03-31 ENCOUNTER — Ambulatory Visit: Admitting: Student

## 2025-08-10 ENCOUNTER — Ambulatory Visit
